# Patient Record
Sex: Female | Born: 1935 | Race: White | Hispanic: No
Health system: Southern US, Community
[De-identification: ages and names within clinical notes are randomized; demographics above are authoritative.]

## PROBLEM LIST (undated history)

## (undated) DIAGNOSIS — F0280 Dementia in other diseases classified elsewhere without behavioral disturbance: Secondary | ICD-10-CM

## (undated) DIAGNOSIS — F339 Major depressive disorder, recurrent, unspecified: Secondary | ICD-10-CM

## (undated) DIAGNOSIS — N185 Chronic kidney disease, stage 5: Secondary | ICD-10-CM

## (undated) DIAGNOSIS — M1990 Unspecified osteoarthritis, unspecified site: Secondary | ICD-10-CM

## (undated) DIAGNOSIS — R410 Disorientation, unspecified: Secondary | ICD-10-CM

## (undated) DIAGNOSIS — E079 Disorder of thyroid, unspecified: Secondary | ICD-10-CM

## (undated) DIAGNOSIS — I10 Essential (primary) hypertension: Secondary | ICD-10-CM

## (undated) DIAGNOSIS — G309 Alzheimer's disease, unspecified: Secondary | ICD-10-CM

## (undated) DIAGNOSIS — I120 Hypertensive chronic kidney disease with stage 5 chronic kidney disease or end stage renal disease: Secondary | ICD-10-CM

## (undated) DIAGNOSIS — E039 Hypothyroidism, unspecified: Secondary | ICD-10-CM

## (undated) HISTORY — DX: Alzheimer's disease, unspecified: G30.9

## (undated) HISTORY — DX: Essential (primary) hypertension: I10

## (undated) HISTORY — PX: SPINE SURGERY: SHX786

## (undated) HISTORY — DX: Disorder of thyroid, unspecified: E07.9

## (undated) HISTORY — DX: Major depressive disorder, recurrent, unspecified: F33.9

## (undated) HISTORY — DX: Hypothyroidism, unspecified: E03.9

## (undated) HISTORY — DX: Hypertensive chronic kidney disease with stage 5 chronic kidney disease or end stage renal disease: I12.0

## (undated) HISTORY — DX: Chronic kidney disease, stage 5: N18.5

## (undated) HISTORY — DX: Dementia in other diseases classified elsewhere without behavioral disturbance: F02.80

## (undated) HISTORY — PX: KNEE ARTHROSCOPY: SHX127

## (undated) HISTORY — DX: Unspecified osteoarthritis, unspecified site: M19.90

---
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
## 1984-08-08 HISTORY — PX: ABDOMINAL HYSTERECTOMY: SHX81

## 2010-10-28 DIAGNOSIS — G309 Alzheimer's disease, unspecified: Secondary | ICD-10-CM | POA: Insufficient documentation

## 2010-10-28 DIAGNOSIS — F0280 Dementia in other diseases classified elsewhere without behavioral disturbance: Secondary | ICD-10-CM | POA: Insufficient documentation

## 2015-08-12 DIAGNOSIS — M1711 Unilateral primary osteoarthritis, right knee: Secondary | ICD-10-CM | POA: Diagnosis not present

## 2015-08-14 DIAGNOSIS — E210 Primary hyperparathyroidism: Secondary | ICD-10-CM | POA: Diagnosis not present

## 2015-08-21 DIAGNOSIS — E041 Nontoxic single thyroid nodule: Secondary | ICD-10-CM | POA: Diagnosis not present

## 2015-08-21 DIAGNOSIS — D351 Benign neoplasm of parathyroid gland: Secondary | ICD-10-CM | POA: Diagnosis not present

## 2015-08-24 DIAGNOSIS — D351 Benign neoplasm of parathyroid gland: Secondary | ICD-10-CM | POA: Diagnosis not present

## 2015-08-26 DIAGNOSIS — D351 Benign neoplasm of parathyroid gland: Secondary | ICD-10-CM | POA: Diagnosis not present

## 2015-09-01 DIAGNOSIS — M2062 Acquired deformities of toe(s), unspecified, left foot: Secondary | ICD-10-CM | POA: Diagnosis not present

## 2015-09-01 DIAGNOSIS — L603 Nail dystrophy: Secondary | ICD-10-CM | POA: Diagnosis not present

## 2015-09-01 DIAGNOSIS — M2061 Acquired deformities of toe(s), unspecified, right foot: Secondary | ICD-10-CM | POA: Diagnosis not present

## 2015-09-11 DIAGNOSIS — M1711 Unilateral primary osteoarthritis, right knee: Secondary | ICD-10-CM | POA: Diagnosis not present

## 2015-09-28 DIAGNOSIS — Z981 Arthrodesis status: Secondary | ICD-10-CM | POA: Diagnosis not present

## 2015-09-28 DIAGNOSIS — M4806 Spinal stenosis, lumbar region: Secondary | ICD-10-CM | POA: Diagnosis not present

## 2015-09-28 DIAGNOSIS — M25461 Effusion, right knee: Secondary | ICD-10-CM | POA: Diagnosis not present

## 2015-09-28 DIAGNOSIS — G8929 Other chronic pain: Secondary | ICD-10-CM | POA: Diagnosis not present

## 2015-09-28 DIAGNOSIS — E213 Hyperparathyroidism, unspecified: Secondary | ICD-10-CM | POA: Diagnosis not present

## 2015-09-28 DIAGNOSIS — I10 Essential (primary) hypertension: Secondary | ICD-10-CM | POA: Diagnosis not present

## 2015-09-28 DIAGNOSIS — M1711 Unilateral primary osteoarthritis, right knee: Secondary | ICD-10-CM | POA: Diagnosis not present

## 2015-09-28 DIAGNOSIS — M545 Low back pain: Secondary | ICD-10-CM | POA: Diagnosis not present

## 2015-09-28 DIAGNOSIS — M25561 Pain in right knee: Secondary | ICD-10-CM | POA: Diagnosis not present

## 2015-10-03 DIAGNOSIS — N200 Calculus of kidney: Secondary | ICD-10-CM | POA: Diagnosis not present

## 2015-11-19 DIAGNOSIS — M1711 Unilateral primary osteoarthritis, right knee: Secondary | ICD-10-CM | POA: Diagnosis not present

## 2015-12-17 DIAGNOSIS — Z01812 Encounter for preprocedural laboratory examination: Secondary | ICD-10-CM | POA: Diagnosis not present

## 2015-12-17 DIAGNOSIS — M1711 Unilateral primary osteoarthritis, right knee: Secondary | ICD-10-CM | POA: Diagnosis not present

## 2015-12-17 DIAGNOSIS — R001 Bradycardia, unspecified: Secondary | ICD-10-CM | POA: Diagnosis not present

## 2015-12-17 DIAGNOSIS — Z01818 Encounter for other preprocedural examination: Secondary | ICD-10-CM | POA: Diagnosis not present

## 2015-12-30 DIAGNOSIS — I10 Essential (primary) hypertension: Secondary | ICD-10-CM | POA: Diagnosis not present

## 2015-12-30 DIAGNOSIS — E213 Hyperparathyroidism, unspecified: Secondary | ICD-10-CM | POA: Diagnosis not present

## 2015-12-30 DIAGNOSIS — G250 Essential tremor: Secondary | ICD-10-CM | POA: Diagnosis not present

## 2015-12-30 DIAGNOSIS — G3184 Mild cognitive impairment, so stated: Secondary | ICD-10-CM | POA: Diagnosis not present

## 2015-12-30 DIAGNOSIS — E034 Atrophy of thyroid (acquired): Secondary | ICD-10-CM | POA: Diagnosis not present

## 2015-12-30 DIAGNOSIS — Z01818 Encounter for other preprocedural examination: Secondary | ICD-10-CM | POA: Diagnosis not present

## 2016-01-06 DIAGNOSIS — I10 Essential (primary) hypertension: Secondary | ICD-10-CM | POA: Diagnosis not present

## 2016-01-12 DIAGNOSIS — I10 Essential (primary) hypertension: Secondary | ICD-10-CM | POA: Diagnosis not present

## 2016-01-13 DIAGNOSIS — M1711 Unilateral primary osteoarthritis, right knee: Secondary | ICD-10-CM | POA: Diagnosis not present

## 2016-01-13 DIAGNOSIS — F329 Major depressive disorder, single episode, unspecified: Secondary | ICD-10-CM | POA: Diagnosis present

## 2016-01-13 DIAGNOSIS — G250 Essential tremor: Secondary | ICD-10-CM | POA: Diagnosis present

## 2016-01-13 DIAGNOSIS — Z66 Do not resuscitate: Secondary | ICD-10-CM | POA: Diagnosis present

## 2016-01-13 DIAGNOSIS — E039 Hypothyroidism, unspecified: Secondary | ICD-10-CM | POA: Diagnosis present

## 2016-01-13 DIAGNOSIS — E213 Hyperparathyroidism, unspecified: Secondary | ICD-10-CM | POA: Diagnosis present

## 2016-01-13 DIAGNOSIS — Z96651 Presence of right artificial knee joint: Secondary | ICD-10-CM | POA: Diagnosis present

## 2016-01-13 DIAGNOSIS — I10 Essential (primary) hypertension: Secondary | ICD-10-CM | POA: Diagnosis present

## 2016-01-13 DIAGNOSIS — Z7982 Long term (current) use of aspirin: Secondary | ICD-10-CM | POA: Diagnosis not present

## 2016-01-13 DIAGNOSIS — Z471 Aftercare following joint replacement surgery: Secondary | ICD-10-CM | POA: Diagnosis not present

## 2016-01-13 DIAGNOSIS — G8918 Other acute postprocedural pain: Secondary | ICD-10-CM | POA: Diagnosis not present

## 2016-01-13 DIAGNOSIS — Z79899 Other long term (current) drug therapy: Secondary | ICD-10-CM | POA: Diagnosis not present

## 2016-01-13 DIAGNOSIS — G609 Hereditary and idiopathic neuropathy, unspecified: Secondary | ICD-10-CM | POA: Diagnosis present

## 2016-01-13 DIAGNOSIS — G3184 Mild cognitive impairment, so stated: Secondary | ICD-10-CM | POA: Diagnosis present

## 2016-01-13 DIAGNOSIS — K5900 Constipation, unspecified: Secondary | ICD-10-CM | POA: Diagnosis present

## 2016-01-16 DIAGNOSIS — R4586 Emotional lability: Secondary | ICD-10-CM | POA: Diagnosis not present

## 2016-01-16 DIAGNOSIS — I10 Essential (primary) hypertension: Secondary | ICD-10-CM | POA: Diagnosis present

## 2016-01-16 DIAGNOSIS — G3184 Mild cognitive impairment, so stated: Secondary | ICD-10-CM | POA: Diagnosis present

## 2016-01-16 DIAGNOSIS — R262 Difficulty in walking, not elsewhere classified: Secondary | ICD-10-CM | POA: Diagnosis not present

## 2016-01-16 DIAGNOSIS — D62 Acute posthemorrhagic anemia: Secondary | ICD-10-CM | POA: Diagnosis not present

## 2016-01-16 DIAGNOSIS — Z96652 Presence of left artificial knee joint: Secondary | ICD-10-CM | POA: Diagnosis not present

## 2016-01-16 DIAGNOSIS — R4189 Other symptoms and signs involving cognitive functions and awareness: Secondary | ICD-10-CM | POA: Diagnosis not present

## 2016-01-16 DIAGNOSIS — K5900 Constipation, unspecified: Secondary | ICD-10-CM | POA: Diagnosis present

## 2016-01-16 DIAGNOSIS — M7989 Other specified soft tissue disorders: Secondary | ICD-10-CM | POA: Diagnosis not present

## 2016-01-16 DIAGNOSIS — G609 Hereditary and idiopathic neuropathy, unspecified: Secondary | ICD-10-CM | POA: Diagnosis present

## 2016-01-16 DIAGNOSIS — Z79899 Other long term (current) drug therapy: Secondary | ICD-10-CM | POA: Diagnosis not present

## 2016-01-16 DIAGNOSIS — G250 Essential tremor: Secondary | ICD-10-CM | POA: Diagnosis present

## 2016-01-16 DIAGNOSIS — Z7982 Long term (current) use of aspirin: Secondary | ICD-10-CM | POA: Diagnosis not present

## 2016-01-16 DIAGNOSIS — F329 Major depressive disorder, single episode, unspecified: Secondary | ICD-10-CM | POA: Diagnosis not present

## 2016-01-16 DIAGNOSIS — E213 Hyperparathyroidism, unspecified: Secondary | ICD-10-CM | POA: Diagnosis not present

## 2016-01-16 DIAGNOSIS — E039 Hypothyroidism, unspecified: Secondary | ICD-10-CM | POA: Diagnosis present

## 2016-01-16 DIAGNOSIS — Z471 Aftercare following joint replacement surgery: Secondary | ICD-10-CM | POA: Diagnosis not present

## 2016-01-16 DIAGNOSIS — R2241 Localized swelling, mass and lump, right lower limb: Secondary | ICD-10-CM | POA: Diagnosis not present

## 2016-01-16 DIAGNOSIS — Z66 Do not resuscitate: Secondary | ICD-10-CM | POA: Diagnosis present

## 2016-01-16 DIAGNOSIS — Z96651 Presence of right artificial knee joint: Secondary | ICD-10-CM | POA: Diagnosis not present

## 2016-01-17 DIAGNOSIS — M7989 Other specified soft tissue disorders: Secondary | ICD-10-CM | POA: Diagnosis not present

## 2016-01-17 DIAGNOSIS — R2241 Localized swelling, mass and lump, right lower limb: Secondary | ICD-10-CM | POA: Diagnosis not present

## 2016-01-18 DIAGNOSIS — R262 Difficulty in walking, not elsewhere classified: Secondary | ICD-10-CM | POA: Diagnosis not present

## 2016-01-18 DIAGNOSIS — R4586 Emotional lability: Secondary | ICD-10-CM | POA: Diagnosis not present

## 2016-01-18 DIAGNOSIS — R4189 Other symptoms and signs involving cognitive functions and awareness: Secondary | ICD-10-CM | POA: Diagnosis not present

## 2016-01-18 DIAGNOSIS — Z96651 Presence of right artificial knee joint: Secondary | ICD-10-CM | POA: Diagnosis not present

## 2016-01-21 DIAGNOSIS — R4189 Other symptoms and signs involving cognitive functions and awareness: Secondary | ICD-10-CM | POA: Diagnosis not present

## 2016-01-21 DIAGNOSIS — E213 Hyperparathyroidism, unspecified: Secondary | ICD-10-CM | POA: Diagnosis not present

## 2016-01-21 DIAGNOSIS — Z96652 Presence of left artificial knee joint: Secondary | ICD-10-CM | POA: Diagnosis not present

## 2016-01-25 DIAGNOSIS — F329 Major depressive disorder, single episode, unspecified: Secondary | ICD-10-CM | POA: Diagnosis not present

## 2016-01-25 DIAGNOSIS — D62 Acute posthemorrhagic anemia: Secondary | ICD-10-CM | POA: Diagnosis not present

## 2016-01-25 DIAGNOSIS — R262 Difficulty in walking, not elsewhere classified: Secondary | ICD-10-CM | POA: Diagnosis not present

## 2016-01-28 DIAGNOSIS — Z96651 Presence of right artificial knee joint: Secondary | ICD-10-CM | POA: Diagnosis not present

## 2016-01-28 DIAGNOSIS — H8149 Vertigo of central origin, unspecified ear: Secondary | ICD-10-CM | POA: Diagnosis not present

## 2016-01-28 DIAGNOSIS — Z471 Aftercare following joint replacement surgery: Secondary | ICD-10-CM | POA: Diagnosis not present

## 2016-01-28 DIAGNOSIS — M4806 Spinal stenosis, lumbar region: Secondary | ICD-10-CM | POA: Diagnosis not present

## 2016-01-28 DIAGNOSIS — M810 Age-related osteoporosis without current pathological fracture: Secondary | ICD-10-CM | POA: Diagnosis not present

## 2016-01-28 DIAGNOSIS — R2689 Other abnormalities of gait and mobility: Secondary | ICD-10-CM | POA: Diagnosis not present

## 2016-01-28 DIAGNOSIS — I10 Essential (primary) hypertension: Secondary | ICD-10-CM | POA: Diagnosis not present

## 2016-01-28 DIAGNOSIS — Z602 Problems related to living alone: Secondary | ICD-10-CM | POA: Diagnosis not present

## 2016-01-28 DIAGNOSIS — F329 Major depressive disorder, single episode, unspecified: Secondary | ICD-10-CM | POA: Diagnosis not present

## 2016-01-29 DIAGNOSIS — F329 Major depressive disorder, single episode, unspecified: Secondary | ICD-10-CM | POA: Diagnosis not present

## 2016-01-29 DIAGNOSIS — Z471 Aftercare following joint replacement surgery: Secondary | ICD-10-CM | POA: Diagnosis not present

## 2016-01-29 DIAGNOSIS — M810 Age-related osteoporosis without current pathological fracture: Secondary | ICD-10-CM | POA: Diagnosis not present

## 2016-01-29 DIAGNOSIS — M4806 Spinal stenosis, lumbar region: Secondary | ICD-10-CM | POA: Diagnosis not present

## 2016-01-29 DIAGNOSIS — I10 Essential (primary) hypertension: Secondary | ICD-10-CM | POA: Diagnosis not present

## 2016-01-29 DIAGNOSIS — R2689 Other abnormalities of gait and mobility: Secondary | ICD-10-CM | POA: Diagnosis not present

## 2016-01-31 DIAGNOSIS — F329 Major depressive disorder, single episode, unspecified: Secondary | ICD-10-CM | POA: Diagnosis not present

## 2016-01-31 DIAGNOSIS — M810 Age-related osteoporosis without current pathological fracture: Secondary | ICD-10-CM | POA: Diagnosis not present

## 2016-01-31 DIAGNOSIS — Z471 Aftercare following joint replacement surgery: Secondary | ICD-10-CM | POA: Diagnosis not present

## 2016-01-31 DIAGNOSIS — M4806 Spinal stenosis, lumbar region: Secondary | ICD-10-CM | POA: Diagnosis not present

## 2016-01-31 DIAGNOSIS — I10 Essential (primary) hypertension: Secondary | ICD-10-CM | POA: Diagnosis not present

## 2016-01-31 DIAGNOSIS — R2689 Other abnormalities of gait and mobility: Secondary | ICD-10-CM | POA: Diagnosis not present

## 2016-02-01 DIAGNOSIS — M810 Age-related osteoporosis without current pathological fracture: Secondary | ICD-10-CM | POA: Diagnosis not present

## 2016-02-01 DIAGNOSIS — Z471 Aftercare following joint replacement surgery: Secondary | ICD-10-CM | POA: Diagnosis not present

## 2016-02-01 DIAGNOSIS — R2689 Other abnormalities of gait and mobility: Secondary | ICD-10-CM | POA: Diagnosis not present

## 2016-02-01 DIAGNOSIS — M4806 Spinal stenosis, lumbar region: Secondary | ICD-10-CM | POA: Diagnosis not present

## 2016-02-01 DIAGNOSIS — I10 Essential (primary) hypertension: Secondary | ICD-10-CM | POA: Diagnosis not present

## 2016-02-01 DIAGNOSIS — F329 Major depressive disorder, single episode, unspecified: Secondary | ICD-10-CM | POA: Diagnosis not present

## 2016-02-03 DIAGNOSIS — Z471 Aftercare following joint replacement surgery: Secondary | ICD-10-CM | POA: Diagnosis not present

## 2016-02-03 DIAGNOSIS — M4806 Spinal stenosis, lumbar region: Secondary | ICD-10-CM | POA: Diagnosis not present

## 2016-02-03 DIAGNOSIS — F329 Major depressive disorder, single episode, unspecified: Secondary | ICD-10-CM | POA: Diagnosis not present

## 2016-02-03 DIAGNOSIS — R2689 Other abnormalities of gait and mobility: Secondary | ICD-10-CM | POA: Diagnosis not present

## 2016-02-03 DIAGNOSIS — M810 Age-related osteoporosis without current pathological fracture: Secondary | ICD-10-CM | POA: Diagnosis not present

## 2016-02-03 DIAGNOSIS — I10 Essential (primary) hypertension: Secondary | ICD-10-CM | POA: Diagnosis not present

## 2016-02-05 DIAGNOSIS — F329 Major depressive disorder, single episode, unspecified: Secondary | ICD-10-CM | POA: Diagnosis not present

## 2016-02-05 DIAGNOSIS — M4806 Spinal stenosis, lumbar region: Secondary | ICD-10-CM | POA: Diagnosis not present

## 2016-02-05 DIAGNOSIS — I10 Essential (primary) hypertension: Secondary | ICD-10-CM | POA: Diagnosis not present

## 2016-02-05 DIAGNOSIS — M810 Age-related osteoporosis without current pathological fracture: Secondary | ICD-10-CM | POA: Diagnosis not present

## 2016-02-05 DIAGNOSIS — R2689 Other abnormalities of gait and mobility: Secondary | ICD-10-CM | POA: Diagnosis not present

## 2016-02-05 DIAGNOSIS — Z471 Aftercare following joint replacement surgery: Secondary | ICD-10-CM | POA: Diagnosis not present

## 2016-02-06 DIAGNOSIS — Z96651 Presence of right artificial knee joint: Secondary | ICD-10-CM | POA: Diagnosis not present

## 2016-02-06 DIAGNOSIS — Z471 Aftercare following joint replacement surgery: Secondary | ICD-10-CM | POA: Diagnosis not present

## 2016-02-08 DIAGNOSIS — M810 Age-related osteoporosis without current pathological fracture: Secondary | ICD-10-CM | POA: Diagnosis not present

## 2016-02-08 DIAGNOSIS — R2689 Other abnormalities of gait and mobility: Secondary | ICD-10-CM | POA: Diagnosis not present

## 2016-02-08 DIAGNOSIS — Z471 Aftercare following joint replacement surgery: Secondary | ICD-10-CM | POA: Diagnosis not present

## 2016-02-08 DIAGNOSIS — F329 Major depressive disorder, single episode, unspecified: Secondary | ICD-10-CM | POA: Diagnosis not present

## 2016-02-08 DIAGNOSIS — I10 Essential (primary) hypertension: Secondary | ICD-10-CM | POA: Diagnosis not present

## 2016-02-08 DIAGNOSIS — M4806 Spinal stenosis, lumbar region: Secondary | ICD-10-CM | POA: Diagnosis not present

## 2016-02-10 DIAGNOSIS — I10 Essential (primary) hypertension: Secondary | ICD-10-CM | POA: Diagnosis not present

## 2016-02-10 DIAGNOSIS — F329 Major depressive disorder, single episode, unspecified: Secondary | ICD-10-CM | POA: Diagnosis not present

## 2016-02-10 DIAGNOSIS — M4806 Spinal stenosis, lumbar region: Secondary | ICD-10-CM | POA: Diagnosis not present

## 2016-02-10 DIAGNOSIS — M810 Age-related osteoporosis without current pathological fracture: Secondary | ICD-10-CM | POA: Diagnosis not present

## 2016-02-10 DIAGNOSIS — Z471 Aftercare following joint replacement surgery: Secondary | ICD-10-CM | POA: Diagnosis not present

## 2016-02-10 DIAGNOSIS — R2689 Other abnormalities of gait and mobility: Secondary | ICD-10-CM | POA: Diagnosis not present

## 2016-02-11 DIAGNOSIS — M810 Age-related osteoporosis without current pathological fracture: Secondary | ICD-10-CM | POA: Diagnosis not present

## 2016-02-11 DIAGNOSIS — I10 Essential (primary) hypertension: Secondary | ICD-10-CM | POA: Diagnosis not present

## 2016-02-11 DIAGNOSIS — Z471 Aftercare following joint replacement surgery: Secondary | ICD-10-CM | POA: Diagnosis not present

## 2016-02-11 DIAGNOSIS — R2689 Other abnormalities of gait and mobility: Secondary | ICD-10-CM | POA: Diagnosis not present

## 2016-02-11 DIAGNOSIS — M4806 Spinal stenosis, lumbar region: Secondary | ICD-10-CM | POA: Diagnosis not present

## 2016-02-11 DIAGNOSIS — F329 Major depressive disorder, single episode, unspecified: Secondary | ICD-10-CM | POA: Diagnosis not present

## 2016-02-12 DIAGNOSIS — M810 Age-related osteoporosis without current pathological fracture: Secondary | ICD-10-CM | POA: Diagnosis not present

## 2016-02-12 DIAGNOSIS — I10 Essential (primary) hypertension: Secondary | ICD-10-CM | POA: Diagnosis not present

## 2016-02-12 DIAGNOSIS — Z471 Aftercare following joint replacement surgery: Secondary | ICD-10-CM | POA: Diagnosis not present

## 2016-02-12 DIAGNOSIS — F329 Major depressive disorder, single episode, unspecified: Secondary | ICD-10-CM | POA: Diagnosis not present

## 2016-02-12 DIAGNOSIS — R2689 Other abnormalities of gait and mobility: Secondary | ICD-10-CM | POA: Diagnosis not present

## 2016-02-12 DIAGNOSIS — M4806 Spinal stenosis, lumbar region: Secondary | ICD-10-CM | POA: Diagnosis not present

## 2016-02-15 DIAGNOSIS — I10 Essential (primary) hypertension: Secondary | ICD-10-CM | POA: Diagnosis not present

## 2016-02-15 DIAGNOSIS — R2689 Other abnormalities of gait and mobility: Secondary | ICD-10-CM | POA: Diagnosis not present

## 2016-02-15 DIAGNOSIS — M810 Age-related osteoporosis without current pathological fracture: Secondary | ICD-10-CM | POA: Diagnosis not present

## 2016-02-15 DIAGNOSIS — M4806 Spinal stenosis, lumbar region: Secondary | ICD-10-CM | POA: Diagnosis not present

## 2016-02-15 DIAGNOSIS — F329 Major depressive disorder, single episode, unspecified: Secondary | ICD-10-CM | POA: Diagnosis not present

## 2016-02-15 DIAGNOSIS — Z471 Aftercare following joint replacement surgery: Secondary | ICD-10-CM | POA: Diagnosis not present

## 2016-02-17 DIAGNOSIS — R2689 Other abnormalities of gait and mobility: Secondary | ICD-10-CM | POA: Diagnosis not present

## 2016-02-17 DIAGNOSIS — I10 Essential (primary) hypertension: Secondary | ICD-10-CM | POA: Diagnosis not present

## 2016-02-17 DIAGNOSIS — M4806 Spinal stenosis, lumbar region: Secondary | ICD-10-CM | POA: Diagnosis not present

## 2016-02-17 DIAGNOSIS — M810 Age-related osteoporosis without current pathological fracture: Secondary | ICD-10-CM | POA: Diagnosis not present

## 2016-02-17 DIAGNOSIS — Z471 Aftercare following joint replacement surgery: Secondary | ICD-10-CM | POA: Diagnosis not present

## 2016-02-17 DIAGNOSIS — F329 Major depressive disorder, single episode, unspecified: Secondary | ICD-10-CM | POA: Diagnosis not present

## 2016-02-18 DIAGNOSIS — M5136 Other intervertebral disc degeneration, lumbar region: Secondary | ICD-10-CM | POA: Diagnosis not present

## 2016-02-18 DIAGNOSIS — I10 Essential (primary) hypertension: Secondary | ICD-10-CM | POA: Diagnosis not present

## 2016-02-18 DIAGNOSIS — R413 Other amnesia: Secondary | ICD-10-CM | POA: Diagnosis not present

## 2016-02-18 DIAGNOSIS — M4686 Other specified inflammatory spondylopathies, lumbar region: Secondary | ICD-10-CM | POA: Diagnosis not present

## 2016-02-18 DIAGNOSIS — M5441 Lumbago with sciatica, right side: Secondary | ICD-10-CM | POA: Diagnosis not present

## 2016-02-18 DIAGNOSIS — M5442 Lumbago with sciatica, left side: Secondary | ICD-10-CM | POA: Diagnosis not present

## 2016-02-18 DIAGNOSIS — R262 Difficulty in walking, not elsewhere classified: Secondary | ICD-10-CM | POA: Diagnosis not present

## 2016-02-18 DIAGNOSIS — G8929 Other chronic pain: Secondary | ICD-10-CM | POA: Diagnosis not present

## 2016-02-18 DIAGNOSIS — Z981 Arthrodesis status: Secondary | ICD-10-CM | POA: Diagnosis not present

## 2016-02-18 DIAGNOSIS — M4806 Spinal stenosis, lumbar region: Secondary | ICD-10-CM | POA: Diagnosis not present

## 2016-02-18 DIAGNOSIS — M4316 Spondylolisthesis, lumbar region: Secondary | ICD-10-CM | POA: Diagnosis not present

## 2016-02-19 DIAGNOSIS — M810 Age-related osteoporosis without current pathological fracture: Secondary | ICD-10-CM | POA: Diagnosis not present

## 2016-02-19 DIAGNOSIS — M4806 Spinal stenosis, lumbar region: Secondary | ICD-10-CM | POA: Diagnosis not present

## 2016-02-19 DIAGNOSIS — I10 Essential (primary) hypertension: Secondary | ICD-10-CM | POA: Diagnosis not present

## 2016-02-19 DIAGNOSIS — F329 Major depressive disorder, single episode, unspecified: Secondary | ICD-10-CM | POA: Diagnosis not present

## 2016-02-19 DIAGNOSIS — R2689 Other abnormalities of gait and mobility: Secondary | ICD-10-CM | POA: Diagnosis not present

## 2016-02-19 DIAGNOSIS — Z471 Aftercare following joint replacement surgery: Secondary | ICD-10-CM | POA: Diagnosis not present

## 2016-02-23 DIAGNOSIS — I10 Essential (primary) hypertension: Secondary | ICD-10-CM | POA: Diagnosis not present

## 2016-02-23 DIAGNOSIS — Z471 Aftercare following joint replacement surgery: Secondary | ICD-10-CM | POA: Diagnosis not present

## 2016-02-23 DIAGNOSIS — R2689 Other abnormalities of gait and mobility: Secondary | ICD-10-CM | POA: Diagnosis not present

## 2016-02-23 DIAGNOSIS — M810 Age-related osteoporosis without current pathological fracture: Secondary | ICD-10-CM | POA: Diagnosis not present

## 2016-02-23 DIAGNOSIS — M4806 Spinal stenosis, lumbar region: Secondary | ICD-10-CM | POA: Diagnosis not present

## 2016-02-23 DIAGNOSIS — F329 Major depressive disorder, single episode, unspecified: Secondary | ICD-10-CM | POA: Diagnosis not present

## 2016-02-25 DIAGNOSIS — I10 Essential (primary) hypertension: Secondary | ICD-10-CM | POA: Diagnosis not present

## 2016-02-25 DIAGNOSIS — F329 Major depressive disorder, single episode, unspecified: Secondary | ICD-10-CM | POA: Diagnosis not present

## 2016-02-25 DIAGNOSIS — M810 Age-related osteoporosis without current pathological fracture: Secondary | ICD-10-CM | POA: Diagnosis not present

## 2016-02-25 DIAGNOSIS — R2689 Other abnormalities of gait and mobility: Secondary | ICD-10-CM | POA: Diagnosis not present

## 2016-02-25 DIAGNOSIS — M4806 Spinal stenosis, lumbar region: Secondary | ICD-10-CM | POA: Diagnosis not present

## 2016-02-25 DIAGNOSIS — Z471 Aftercare following joint replacement surgery: Secondary | ICD-10-CM | POA: Diagnosis not present

## 2016-02-26 DIAGNOSIS — Z471 Aftercare following joint replacement surgery: Secondary | ICD-10-CM | POA: Diagnosis not present

## 2016-02-26 DIAGNOSIS — F329 Major depressive disorder, single episode, unspecified: Secondary | ICD-10-CM | POA: Diagnosis not present

## 2016-02-26 DIAGNOSIS — R2689 Other abnormalities of gait and mobility: Secondary | ICD-10-CM | POA: Diagnosis not present

## 2016-02-26 DIAGNOSIS — M4806 Spinal stenosis, lumbar region: Secondary | ICD-10-CM | POA: Diagnosis not present

## 2016-02-26 DIAGNOSIS — M810 Age-related osteoporosis without current pathological fracture: Secondary | ICD-10-CM | POA: Diagnosis not present

## 2016-02-26 DIAGNOSIS — I10 Essential (primary) hypertension: Secondary | ICD-10-CM | POA: Diagnosis not present

## 2016-02-29 DIAGNOSIS — I10 Essential (primary) hypertension: Secondary | ICD-10-CM | POA: Diagnosis not present

## 2016-02-29 DIAGNOSIS — Z471 Aftercare following joint replacement surgery: Secondary | ICD-10-CM | POA: Diagnosis not present

## 2016-02-29 DIAGNOSIS — R2689 Other abnormalities of gait and mobility: Secondary | ICD-10-CM | POA: Diagnosis not present

## 2016-02-29 DIAGNOSIS — F329 Major depressive disorder, single episode, unspecified: Secondary | ICD-10-CM | POA: Diagnosis not present

## 2016-02-29 DIAGNOSIS — M810 Age-related osteoporosis without current pathological fracture: Secondary | ICD-10-CM | POA: Diagnosis not present

## 2016-02-29 DIAGNOSIS — M4806 Spinal stenosis, lumbar region: Secondary | ICD-10-CM | POA: Diagnosis not present

## 2016-03-01 DIAGNOSIS — I10 Essential (primary) hypertension: Secondary | ICD-10-CM | POA: Diagnosis not present

## 2016-03-01 DIAGNOSIS — M5441 Lumbago with sciatica, right side: Secondary | ICD-10-CM | POA: Diagnosis not present

## 2016-03-01 DIAGNOSIS — G3184 Mild cognitive impairment, so stated: Secondary | ICD-10-CM | POA: Diagnosis not present

## 2016-03-01 DIAGNOSIS — M5442 Lumbago with sciatica, left side: Secondary | ICD-10-CM | POA: Diagnosis not present

## 2016-03-01 DIAGNOSIS — G8929 Other chronic pain: Secondary | ICD-10-CM | POA: Diagnosis not present

## 2016-03-02 DIAGNOSIS — Z471 Aftercare following joint replacement surgery: Secondary | ICD-10-CM | POA: Diagnosis not present

## 2016-03-02 DIAGNOSIS — M810 Age-related osteoporosis without current pathological fracture: Secondary | ICD-10-CM | POA: Diagnosis not present

## 2016-03-02 DIAGNOSIS — M4806 Spinal stenosis, lumbar region: Secondary | ICD-10-CM | POA: Diagnosis not present

## 2016-03-02 DIAGNOSIS — F329 Major depressive disorder, single episode, unspecified: Secondary | ICD-10-CM | POA: Diagnosis not present

## 2016-03-02 DIAGNOSIS — R2689 Other abnormalities of gait and mobility: Secondary | ICD-10-CM | POA: Diagnosis not present

## 2016-03-02 DIAGNOSIS — I10 Essential (primary) hypertension: Secondary | ICD-10-CM | POA: Diagnosis not present

## 2016-03-04 DIAGNOSIS — Z471 Aftercare following joint replacement surgery: Secondary | ICD-10-CM | POA: Diagnosis not present

## 2016-03-04 DIAGNOSIS — M810 Age-related osteoporosis without current pathological fracture: Secondary | ICD-10-CM | POA: Diagnosis not present

## 2016-03-04 DIAGNOSIS — R2689 Other abnormalities of gait and mobility: Secondary | ICD-10-CM | POA: Diagnosis not present

## 2016-03-04 DIAGNOSIS — F329 Major depressive disorder, single episode, unspecified: Secondary | ICD-10-CM | POA: Diagnosis not present

## 2016-03-04 DIAGNOSIS — M4806 Spinal stenosis, lumbar region: Secondary | ICD-10-CM | POA: Diagnosis not present

## 2016-03-04 DIAGNOSIS — I10 Essential (primary) hypertension: Secondary | ICD-10-CM | POA: Diagnosis not present

## 2016-03-10 DIAGNOSIS — M549 Dorsalgia, unspecified: Secondary | ICD-10-CM | POA: Diagnosis not present

## 2016-03-10 DIAGNOSIS — M5416 Radiculopathy, lumbar region: Secondary | ICD-10-CM | POA: Diagnosis not present

## 2016-03-10 DIAGNOSIS — M47817 Spondylosis without myelopathy or radiculopathy, lumbosacral region: Secondary | ICD-10-CM | POA: Diagnosis not present

## 2016-03-10 DIAGNOSIS — Z981 Arthrodesis status: Secondary | ICD-10-CM | POA: Diagnosis not present

## 2016-03-10 DIAGNOSIS — M461 Sacroiliitis, not elsewhere classified: Secondary | ICD-10-CM | POA: Diagnosis not present

## 2016-03-11 DIAGNOSIS — F0280 Dementia in other diseases classified elsewhere without behavioral disturbance: Secondary | ICD-10-CM | POA: Diagnosis not present

## 2016-03-11 DIAGNOSIS — G301 Alzheimer's disease with late onset: Secondary | ICD-10-CM | POA: Diagnosis not present

## 2016-03-11 DIAGNOSIS — K5904 Chronic idiopathic constipation: Secondary | ICD-10-CM | POA: Diagnosis not present

## 2016-03-11 DIAGNOSIS — E034 Atrophy of thyroid (acquired): Secondary | ICD-10-CM | POA: Diagnosis not present

## 2016-03-11 DIAGNOSIS — I10 Essential (primary) hypertension: Secondary | ICD-10-CM | POA: Diagnosis not present

## 2016-03-21 DIAGNOSIS — M961 Postlaminectomy syndrome, not elsewhere classified: Secondary | ICD-10-CM | POA: Diagnosis not present

## 2016-03-21 DIAGNOSIS — Z981 Arthrodesis status: Secondary | ICD-10-CM | POA: Diagnosis not present

## 2016-04-15 DIAGNOSIS — M791 Myalgia: Secondary | ICD-10-CM | POA: Diagnosis not present

## 2016-04-28 DIAGNOSIS — Z471 Aftercare following joint replacement surgery: Secondary | ICD-10-CM | POA: Diagnosis not present

## 2016-04-28 DIAGNOSIS — Z96651 Presence of right artificial knee joint: Secondary | ICD-10-CM | POA: Diagnosis not present

## 2016-06-06 DIAGNOSIS — M5417 Radiculopathy, lumbosacral region: Secondary | ICD-10-CM | POA: Diagnosis not present

## 2016-06-06 DIAGNOSIS — G4700 Insomnia, unspecified: Secondary | ICD-10-CM | POA: Diagnosis not present

## 2016-06-06 DIAGNOSIS — F0280 Dementia in other diseases classified elsewhere without behavioral disturbance: Secondary | ICD-10-CM | POA: Diagnosis not present

## 2016-06-06 DIAGNOSIS — F418 Other specified anxiety disorders: Secondary | ICD-10-CM | POA: Diagnosis not present

## 2016-06-06 DIAGNOSIS — G309 Alzheimer's disease, unspecified: Secondary | ICD-10-CM | POA: Diagnosis not present

## 2016-06-13 DIAGNOSIS — E039 Hypothyroidism, unspecified: Secondary | ICD-10-CM | POA: Diagnosis not present

## 2016-06-13 DIAGNOSIS — E213 Hyperparathyroidism, unspecified: Secondary | ICD-10-CM | POA: Diagnosis not present

## 2016-06-13 DIAGNOSIS — I10 Essential (primary) hypertension: Secondary | ICD-10-CM | POA: Diagnosis not present

## 2016-06-13 DIAGNOSIS — M810 Age-related osteoporosis without current pathological fracture: Secondary | ICD-10-CM | POA: Diagnosis not present

## 2016-06-13 DIAGNOSIS — G309 Alzheimer's disease, unspecified: Secondary | ICD-10-CM | POA: Diagnosis not present

## 2016-06-13 DIAGNOSIS — Z0000 Encounter for general adult medical examination without abnormal findings: Secondary | ICD-10-CM | POA: Diagnosis not present

## 2016-06-13 DIAGNOSIS — M4800 Spinal stenosis, site unspecified: Secondary | ICD-10-CM | POA: Diagnosis not present

## 2016-06-13 DIAGNOSIS — K5900 Constipation, unspecified: Secondary | ICD-10-CM | POA: Diagnosis not present

## 2016-06-13 DIAGNOSIS — Z23 Encounter for immunization: Secondary | ICD-10-CM | POA: Diagnosis not present

## 2016-06-13 DIAGNOSIS — F329 Major depressive disorder, single episode, unspecified: Secondary | ICD-10-CM | POA: Diagnosis not present

## 2016-06-16 DIAGNOSIS — F329 Major depressive disorder, single episode, unspecified: Secondary | ICD-10-CM | POA: Diagnosis not present

## 2016-06-16 DIAGNOSIS — Z0000 Encounter for general adult medical examination without abnormal findings: Secondary | ICD-10-CM | POA: Diagnosis not present

## 2016-06-16 DIAGNOSIS — M4800 Spinal stenosis, site unspecified: Secondary | ICD-10-CM | POA: Diagnosis not present

## 2016-06-16 DIAGNOSIS — I10 Essential (primary) hypertension: Secondary | ICD-10-CM | POA: Diagnosis not present

## 2016-06-16 DIAGNOSIS — E039 Hypothyroidism, unspecified: Secondary | ICD-10-CM | POA: Diagnosis not present

## 2016-06-16 DIAGNOSIS — G309 Alzheimer's disease, unspecified: Secondary | ICD-10-CM | POA: Diagnosis not present

## 2016-06-16 DIAGNOSIS — K5900 Constipation, unspecified: Secondary | ICD-10-CM | POA: Diagnosis not present

## 2016-06-17 DIAGNOSIS — M5416 Radiculopathy, lumbar region: Secondary | ICD-10-CM | POA: Diagnosis not present

## 2016-06-17 DIAGNOSIS — M48061 Spinal stenosis, lumbar region without neurogenic claudication: Secondary | ICD-10-CM | POA: Diagnosis not present

## 2016-06-17 DIAGNOSIS — M961 Postlaminectomy syndrome, not elsewhere classified: Secondary | ICD-10-CM | POA: Diagnosis not present

## 2016-06-22 DIAGNOSIS — G309 Alzheimer's disease, unspecified: Secondary | ICD-10-CM | POA: Diagnosis not present

## 2016-06-22 DIAGNOSIS — I10 Essential (primary) hypertension: Secondary | ICD-10-CM | POA: Diagnosis not present

## 2016-06-22 DIAGNOSIS — G4700 Insomnia, unspecified: Secondary | ICD-10-CM | POA: Diagnosis not present

## 2016-06-22 DIAGNOSIS — F329 Major depressive disorder, single episode, unspecified: Secondary | ICD-10-CM | POA: Diagnosis not present

## 2016-06-23 DIAGNOSIS — M4686 Other specified inflammatory spondylopathies, lumbar region: Secondary | ICD-10-CM | POA: Diagnosis not present

## 2016-06-23 DIAGNOSIS — M5136 Other intervertebral disc degeneration, lumbar region: Secondary | ICD-10-CM | POA: Diagnosis not present

## 2016-06-23 DIAGNOSIS — M5417 Radiculopathy, lumbosacral region: Secondary | ICD-10-CM | POA: Diagnosis not present

## 2016-06-27 DIAGNOSIS — G309 Alzheimer's disease, unspecified: Secondary | ICD-10-CM | POA: Diagnosis not present

## 2016-06-27 DIAGNOSIS — G9340 Encephalopathy, unspecified: Secondary | ICD-10-CM | POA: Diagnosis not present

## 2016-06-27 DIAGNOSIS — F0280 Dementia in other diseases classified elsewhere without behavioral disturbance: Secondary | ICD-10-CM | POA: Diagnosis not present

## 2016-06-27 DIAGNOSIS — F418 Other specified anxiety disorders: Secondary | ICD-10-CM | POA: Diagnosis not present

## 2016-07-11 DIAGNOSIS — F329 Major depressive disorder, single episode, unspecified: Secondary | ICD-10-CM | POA: Diagnosis not present

## 2016-07-11 DIAGNOSIS — I10 Essential (primary) hypertension: Secondary | ICD-10-CM | POA: Diagnosis not present

## 2016-07-11 DIAGNOSIS — M5417 Radiculopathy, lumbosacral region: Secondary | ICD-10-CM | POA: Diagnosis not present

## 2016-07-11 DIAGNOSIS — G4700 Insomnia, unspecified: Secondary | ICD-10-CM | POA: Diagnosis not present

## 2016-07-12 DIAGNOSIS — M5417 Radiculopathy, lumbosacral region: Secondary | ICD-10-CM | POA: Diagnosis not present

## 2016-07-12 DIAGNOSIS — Z4789 Encounter for other orthopedic aftercare: Secondary | ICD-10-CM | POA: Diagnosis not present

## 2016-07-12 DIAGNOSIS — M961 Postlaminectomy syndrome, not elsewhere classified: Secondary | ICD-10-CM | POA: Diagnosis not present

## 2016-07-12 DIAGNOSIS — Z8739 Personal history of other diseases of the musculoskeletal system and connective tissue: Secondary | ICD-10-CM | POA: Diagnosis not present

## 2016-07-12 DIAGNOSIS — M48061 Spinal stenosis, lumbar region without neurogenic claudication: Secondary | ICD-10-CM | POA: Diagnosis not present

## 2016-07-12 DIAGNOSIS — Z981 Arthrodesis status: Secondary | ICD-10-CM | POA: Diagnosis not present

## 2016-07-15 DIAGNOSIS — Z4789 Encounter for other orthopedic aftercare: Secondary | ICD-10-CM | POA: Diagnosis not present

## 2016-07-15 DIAGNOSIS — Z981 Arthrodesis status: Secondary | ICD-10-CM | POA: Diagnosis not present

## 2016-07-15 DIAGNOSIS — Z8739 Personal history of other diseases of the musculoskeletal system and connective tissue: Secondary | ICD-10-CM | POA: Diagnosis not present

## 2016-07-19 DIAGNOSIS — Z8739 Personal history of other diseases of the musculoskeletal system and connective tissue: Secondary | ICD-10-CM | POA: Diagnosis not present

## 2016-07-19 DIAGNOSIS — Z981 Arthrodesis status: Secondary | ICD-10-CM | POA: Diagnosis not present

## 2016-07-19 DIAGNOSIS — Z4789 Encounter for other orthopedic aftercare: Secondary | ICD-10-CM | POA: Diagnosis not present

## 2016-07-22 DIAGNOSIS — Z4789 Encounter for other orthopedic aftercare: Secondary | ICD-10-CM | POA: Diagnosis not present

## 2016-07-22 DIAGNOSIS — Z981 Arthrodesis status: Secondary | ICD-10-CM | POA: Diagnosis not present

## 2016-07-22 DIAGNOSIS — Z8739 Personal history of other diseases of the musculoskeletal system and connective tissue: Secondary | ICD-10-CM | POA: Diagnosis not present

## 2016-07-25 DIAGNOSIS — Z8739 Personal history of other diseases of the musculoskeletal system and connective tissue: Secondary | ICD-10-CM | POA: Diagnosis not present

## 2016-07-25 DIAGNOSIS — Z981 Arthrodesis status: Secondary | ICD-10-CM | POA: Diagnosis not present

## 2016-07-25 DIAGNOSIS — Z4789 Encounter for other orthopedic aftercare: Secondary | ICD-10-CM | POA: Diagnosis not present

## 2016-07-29 DIAGNOSIS — Z8739 Personal history of other diseases of the musculoskeletal system and connective tissue: Secondary | ICD-10-CM | POA: Diagnosis not present

## 2016-07-29 DIAGNOSIS — Z4789 Encounter for other orthopedic aftercare: Secondary | ICD-10-CM | POA: Diagnosis not present

## 2016-07-29 DIAGNOSIS — Z981 Arthrodesis status: Secondary | ICD-10-CM | POA: Diagnosis not present

## 2016-08-11 DIAGNOSIS — Z981 Arthrodesis status: Secondary | ICD-10-CM | POA: Diagnosis not present

## 2016-08-11 DIAGNOSIS — Z4789 Encounter for other orthopedic aftercare: Secondary | ICD-10-CM | POA: Diagnosis not present

## 2016-08-11 DIAGNOSIS — Z8739 Personal history of other diseases of the musculoskeletal system and connective tissue: Secondary | ICD-10-CM | POA: Diagnosis not present

## 2016-08-16 DIAGNOSIS — Z8739 Personal history of other diseases of the musculoskeletal system and connective tissue: Secondary | ICD-10-CM | POA: Diagnosis not present

## 2016-08-16 DIAGNOSIS — Z981 Arthrodesis status: Secondary | ICD-10-CM | POA: Diagnosis not present

## 2016-08-16 DIAGNOSIS — Z4789 Encounter for other orthopedic aftercare: Secondary | ICD-10-CM | POA: Diagnosis not present

## 2016-08-25 DIAGNOSIS — Z981 Arthrodesis status: Secondary | ICD-10-CM | POA: Diagnosis not present

## 2016-08-25 DIAGNOSIS — Z4789 Encounter for other orthopedic aftercare: Secondary | ICD-10-CM | POA: Diagnosis not present

## 2016-08-25 DIAGNOSIS — Z8739 Personal history of other diseases of the musculoskeletal system and connective tissue: Secondary | ICD-10-CM | POA: Diagnosis not present

## 2016-09-07 DIAGNOSIS — F329 Major depressive disorder, single episode, unspecified: Secondary | ICD-10-CM | POA: Diagnosis not present

## 2016-09-07 DIAGNOSIS — I10 Essential (primary) hypertension: Secondary | ICD-10-CM | POA: Diagnosis not present

## 2016-09-07 DIAGNOSIS — E213 Hyperparathyroidism, unspecified: Secondary | ICD-10-CM | POA: Diagnosis not present

## 2016-09-07 DIAGNOSIS — E039 Hypothyroidism, unspecified: Secondary | ICD-10-CM | POA: Diagnosis not present

## 2016-09-07 DIAGNOSIS — Z23 Encounter for immunization: Secondary | ICD-10-CM | POA: Diagnosis not present

## 2016-09-12 DIAGNOSIS — M5416 Radiculopathy, lumbar region: Secondary | ICD-10-CM | POA: Diagnosis not present

## 2016-09-12 DIAGNOSIS — G8929 Other chronic pain: Secondary | ICD-10-CM | POA: Diagnosis not present

## 2016-09-12 DIAGNOSIS — F329 Major depressive disorder, single episode, unspecified: Secondary | ICD-10-CM | POA: Diagnosis not present

## 2016-09-12 DIAGNOSIS — F0281 Dementia in other diseases classified elsewhere with behavioral disturbance: Secondary | ICD-10-CM | POA: Diagnosis not present

## 2016-09-12 DIAGNOSIS — G309 Alzheimer's disease, unspecified: Secondary | ICD-10-CM | POA: Diagnosis not present

## 2016-09-28 DIAGNOSIS — F0280 Dementia in other diseases classified elsewhere without behavioral disturbance: Secondary | ICD-10-CM | POA: Diagnosis not present

## 2016-09-28 DIAGNOSIS — E213 Hyperparathyroidism, unspecified: Secondary | ICD-10-CM | POA: Diagnosis not present

## 2016-09-28 DIAGNOSIS — G4700 Insomnia, unspecified: Secondary | ICD-10-CM | POA: Diagnosis not present

## 2016-09-28 DIAGNOSIS — K5900 Constipation, unspecified: Secondary | ICD-10-CM | POA: Diagnosis not present

## 2016-09-28 DIAGNOSIS — G309 Alzheimer's disease, unspecified: Secondary | ICD-10-CM | POA: Diagnosis not present

## 2016-09-28 DIAGNOSIS — E039 Hypothyroidism, unspecified: Secondary | ICD-10-CM | POA: Diagnosis not present

## 2016-09-28 DIAGNOSIS — I10 Essential (primary) hypertension: Secondary | ICD-10-CM | POA: Diagnosis not present

## 2016-09-28 DIAGNOSIS — M4800 Spinal stenosis, site unspecified: Secondary | ICD-10-CM | POA: Diagnosis not present

## 2016-09-28 DIAGNOSIS — G8929 Other chronic pain: Secondary | ICD-10-CM | POA: Diagnosis not present

## 2016-09-28 DIAGNOSIS — M810 Age-related osteoporosis without current pathological fracture: Secondary | ICD-10-CM | POA: Diagnosis not present

## 2016-09-28 DIAGNOSIS — F329 Major depressive disorder, single episode, unspecified: Secondary | ICD-10-CM | POA: Diagnosis not present

## 2016-10-27 ENCOUNTER — Non-Acute Institutional Stay: Payer: Medicare Other | Admitting: Nurse Practitioner

## 2016-10-27 ENCOUNTER — Encounter: Payer: Self-pay | Admitting: Nurse Practitioner

## 2016-10-27 DIAGNOSIS — F339 Major depressive disorder, recurrent, unspecified: Secondary | ICD-10-CM

## 2016-10-27 DIAGNOSIS — I10 Essential (primary) hypertension: Secondary | ICD-10-CM | POA: Diagnosis not present

## 2016-10-27 DIAGNOSIS — E039 Hypothyroidism, unspecified: Secondary | ICD-10-CM | POA: Diagnosis not present

## 2016-10-27 DIAGNOSIS — F32A Depression, unspecified: Secondary | ICD-10-CM | POA: Insufficient documentation

## 2016-10-27 DIAGNOSIS — M1990 Unspecified osteoarthritis, unspecified site: Secondary | ICD-10-CM

## 2016-10-27 DIAGNOSIS — F419 Anxiety disorder, unspecified: Secondary | ICD-10-CM | POA: Insufficient documentation

## 2016-10-27 DIAGNOSIS — F0280 Dementia in other diseases classified elsewhere without behavioral disturbance: Secondary | ICD-10-CM

## 2016-10-27 DIAGNOSIS — M159 Polyosteoarthritis, unspecified: Secondary | ICD-10-CM | POA: Insufficient documentation

## 2016-10-27 DIAGNOSIS — G309 Alzheimer's disease, unspecified: Secondary | ICD-10-CM

## 2016-10-27 HISTORY — DX: Essential (primary) hypertension: I10

## 2016-10-27 HISTORY — DX: Dementia in other diseases classified elsewhere, unspecified severity, without behavioral disturbance, psychotic disturbance, mood disturbance, and anxiety: F02.80

## 2016-10-27 HISTORY — DX: Major depressive disorder, recurrent, unspecified: F33.9

## 2016-10-27 HISTORY — DX: Hypothyroidism, unspecified: E03.9

## 2016-10-27 HISTORY — DX: Unspecified osteoarthritis, unspecified site: M19.90

## 2016-10-27 NOTE — Assessment & Plan Note (Signed)
Stable, she stated she is adjusting to [facility], frequent awakens at night, but not new. Continue Cymbalta 30mg , Trazodone 50mg , observe.

## 2016-10-27 NOTE — Assessment & Plan Note (Signed)
Taking Levothyroxine 75mcg daily, update TSH

## 2016-10-27 NOTE — Assessment & Plan Note (Signed)
Multiple sites, ambulates with walker, taking naproxen 220mg  bid.

## 2016-10-27 NOTE — Assessment & Plan Note (Signed)
AL for living, update MMSE, continue Namenda, Aricept.

## 2016-10-27 NOTE — Progress Notes (Signed)
Provider:  Marlana Latus NP Location:   [facility]   Place of Service:  ALF (13)  PCP: Britton Bera X, NP Patient Care Team: Quinterious Walraven Otho Darner, NP as PCP - General (Internal Medicine)  Extended Emergency Contact Information Primary Emergency Contact: [facility] Address: [address]          [place], [place] [address] [name] of [place] Phone: [phone] Relation: Son  Code Status: DNR Goals of Care: Advanced Directive information Advanced Directives 10/27/2016  Does Patient Have a Medical Advance Directive? Yes  Type of Advance Directive [place]  Does patient want to make changes to medical advance directive? No - Patient declined  Copy of [place] in Chart? Yes      Chief Complaint  Patient presents with  . Establish Care    HPI: Patient is a 83 y.o. female seen today for admission to Hx of HTN, controlled on Metoprolol 50mg , elevated today, the patient denied HA, vision change, chest pain/pressure, dizziness, no noted focal weakness. Hx of hypothyroidism, taking Levothyroxine 30mcg, Alzheimer's dementia, taking Namenda Aricept for memory. Ambulates with walker, taking Naproxen 220mg  bid OA pain, she denied pain in shoulders, limbs, back.   Past Medical History:  Diagnosis Date  . Hypertension   . Thyroid disease    Past Surgical History:  Procedure Laterality Date  . ABDOMINAL HYSTERECTOMY  1986  . KNEE ARTHROSCOPY  2015/2017   Dr. Gustavus Bryant  . SPINE SURGERY  2009, 2011   Dr.Mark Nicoletta Dress    has no tobacco, alcohol, and drug history on file. Social History   Social History  . Marital status: Widowed    Spouse name: N/A  . Number of children: N/A  . Years of education: N/A   Occupational History  . Not on file.   Social History Main Topics  . Smoking status: Not on file  . Smokeless tobacco: Not on file  . Alcohol use Not on file  . Drug use: Unknown  . Sexual activity: Not on file   Other Topics Concern  . Not on file     Social History Narrative  . No narrative on file    Functional Status Survey:    Family History  Problem Relation Age of Onset  . Cancer Mother     Health Maintenance  Topic Date Due  . Samul Dada  11/23/1950  . DEXA SCAN  11/24/1998  . PNA vac Low Risk Adult (1 of 2 - PCV13) 11/24/1998  . INFLUENZA VACCINE  03/08/2016    No Known Allergies  Allergies as of 10/27/2016   No Known Allergies     Medication List       Accurate as of 10/27/16  4:51 PM. Always use your most recent med list.          cholecalciferol 1000 units tablet Commonly known as:  VITAMIN D Take 1,000 Units by mouth daily.   DULoxetine 30 MG capsule Commonly known as:  CYMBALTA Take 30 mg by mouth daily.   levothyroxine 25 MCG tablet Commonly known as:  SYNTHROID, LEVOTHROID Take 25 mcg by mouth daily before breakfast.   metoprolol succinate 50 MG 24 hr tablet Commonly known as:  TOPROL-XL Take 50 mg by mouth daily. Take with or immediately following a meal.   NAMZARIC 28-10 MG Cp24 Generic drug:  Memantine HCl-Donepezil HCl Take 1 tablet by mouth daily.   naproxen sodium 220 MG tablet Commonly known as:  ANAPROX Take 220 mg by mouth  2 (two) times daily with a meal.   traZODone 50 MG tablet Commonly known as:  DESYREL Take 50 mg by mouth at bedtime.   vitamin B-12 1000 MCG tablet Commonly known as:  CYANOCOBALAMIN Take 1,000 mcg by mouth daily.   vitamin C 500 MG tablet Commonly known as:  ASCORBIC ACID Take 500 mg by mouth daily.       Review of Systems  Constitutional: Negative for activity change, appetite change, chills, diaphoresis, fatigue and unexpected weight change.  HENT: Positive for hearing loss. Negative for congestion, dental problem, drooling, ear discharge, ear pain, facial swelling, mouth sores, nosebleeds, postnasal drip, rhinorrhea, sinus pain, sinus pressure and sore throat.   Eyes: Negative for photophobia, pain, discharge, redness, itching and visual  disturbance.  Respiratory: Negative for apnea, cough, choking, chest tightness, shortness of breath, wheezing and stridor.   Cardiovascular: Negative for chest pain, palpitations and leg swelling.  Gastrointestinal: Negative for abdominal distention, anal bleeding, blood in stool, constipation, diarrhea, nausea and vomiting.  Endocrine: Negative for cold intolerance and heat intolerance.  Genitourinary: Negative for difficulty urinating, dysuria, flank pain, frequency and urgency.  Musculoskeletal: Positive for arthralgias and gait problem. Negative for back pain, joint swelling, myalgias, neck pain and neck stiffness.  Skin: Negative for color change, pallor, rash and wound.  Allergic/Immunologic: Negative for environmental allergies and food allergies.  Neurological: Negative for dizziness, tremors, syncope, facial asymmetry, speech difficulty, weakness, light-headedness, numbness and headaches.  Hematological: Negative for adenopathy. Does not bruise/bleed easily.  Psychiatric/Behavioral: Positive for confusion and sleep disturbance. Negative for agitation, behavioral problems, decreased concentration, dysphoric mood, hallucinations, self-injury and suicidal ideas. The patient is not hyperactive.     Vitals:   10/27/16 1549  Pulse: 69  Resp: 18  Temp: 98.7 F (37.1 C)  Weight: 133 lb 12.8 oz (60.7 kg)   There is no height or weight on file to calculate BMI. Physical Exam  Constitutional: She is oriented to person, place, and time. She appears well-developed and well-nourished. No distress.  HENT:  Head: Normocephalic and atraumatic.  Right Ear: External ear normal.  Left Ear: External ear normal.  Nose: Nose normal.  Mouth/Throat: Oropharynx is clear and moist. No oropharyngeal exudate.  Eyes: Conjunctivae and EOM are normal. Pupils are equal, round, and reactive to light. Right eye exhibits no discharge. Left eye exhibits no discharge.  Neck: Normal range of motion. Neck supple.  No JVD present. No tracheal deviation present. Thyromegaly present.  Cardiovascular: Normal rate, regular rhythm and normal heart sounds.  Exam reveals no gallop.   No murmur heard. Pulmonary/Chest: Effort normal and breath sounds normal. No stridor. No respiratory distress. She has no wheezes. She has no rales.  Abdominal: Soft. Bowel sounds are normal. She exhibits no distension. There is no tenderness. There is no rebound and no guarding.  Musculoskeletal: She exhibits tenderness. She exhibits no edema.  Lymphadenopathy:    She has no cervical adenopathy.  Neurological: She is alert and oriented to person, place, and time. She has normal reflexes. She displays normal reflexes. No cranial nerve deficit. She exhibits normal muscle tone. Coordination normal.  Skin: Skin is warm and dry. No rash noted. She is not diaphoretic. No erythema. No pallor.  Psychiatric: She has a normal mood and affect. Her behavior is normal. Judgment normal.    Labs reviewed: Basic Metabolic Panel: No results for input(s): NA, K, CL, CO2, GLUCOSE, BUN, CREATININE, CALCIUM, MG, PHOS in the last 8760 hours. Liver Function Tests: No results for  input(s): AST, ALT, ALKPHOS, BILITOT, PROT, ALBUMIN in the last 8760 hours. No results for input(s): LIPASE, AMYLASE in the last 8760 hours. No results for input(s): AMMONIA in the last 8760 hours. CBC: No results for input(s): WBC, NEUTROABS, HGB, HCT, MCV, PLT in the last 8760 hours. Cardiac Enzymes: No results for input(s): CKTOTAL, CKMB, CKMBINDEX, TROPONINI in the last 8760 hours. BNP: Invalid input(s): POCBNP No results found for: HGBA1C No results found for: TSH No results found for: VITAMINB12 No results found for: FOLATE No results found for: IRON, TIBC, FERRITIN  Imaging and Procedures obtained prior to SNF admission: Patient was never admitted.  Assessment/Plan  HTN (hypertension) Controlled, continue Metoprolol 50mg  daily, elevated today, but denied  HA, dizziness, chest pain/pressure, SOB, or focal weakness, update CMP, UA C/S, CBC, Hgb a1c, TSH, MMSE  Depression, recurrent (HCC) Stable, she stated she is adjusting to [facility], frequent awakens at night, but not new. Continue Cymbalta 30mg , Trazodone 50mg , observe.   Alzheimer disease AL for living, update MMSE, continue Namenda, Aricept.   Hypothyroidism Taking Levothyroxine 15mcg daily, update TSH  Osteoarthritis Multiple sites, ambulates with walker, taking naproxen 220mg  bid.  Family/ staff Communication: AL  Labs/tests ordered: CBC CMP TSH UA C/S Hgb a1c MMSE

## 2016-10-27 NOTE — Assessment & Plan Note (Signed)
Controlled, continue Metoprolol 50mg  daily, elevated today, but denied HA, dizziness, chest pain/pressure, SOB, or focal weakness, update CMP, UA C/S, CBC, Hgb a1c, TSH, MMSE

## 2016-11-01 DIAGNOSIS — G309 Alzheimer's disease, unspecified: Secondary | ICD-10-CM | POA: Diagnosis not present

## 2016-11-01 DIAGNOSIS — I10 Essential (primary) hypertension: Secondary | ICD-10-CM | POA: Diagnosis not present

## 2016-11-01 LAB — HEPATIC FUNCTION PANEL
ALK PHOS: 66 U/L (ref 25–125)
ALT: 6 U/L — AB (ref 7–35)
AST: 11 U/L — AB (ref 13–35)
BILIRUBIN, TOTAL: 0.4 mg/dL

## 2016-11-01 LAB — HEMOGLOBIN A1C: HEMOGLOBIN A1C: 5.3

## 2016-11-01 LAB — BASIC METABOLIC PANEL
BUN: 30 mg/dL — AB (ref 4–21)
CREATININE: 1.5 mg/dL — AB (ref <=1.1)
Glucose: 89 mg/dL
Potassium: 4.7 mmol/L (ref 3.4–5.3)
Sodium: 138 mmol/L (ref 137–147)

## 2016-11-01 LAB — CBC AND DIFFERENTIAL
HCT: 33 % — AB (ref 36–46)
Hemoglobin: 11.3 g/dL — AB (ref 12.0–16.0)
Platelets: 264 10*3/uL (ref 150–399)
WBC: 6.7 10*3/mL

## 2016-11-01 LAB — TSH: TSH: 0.86 u[IU]/mL (ref <=5.90)

## 2016-11-02 ENCOUNTER — Encounter: Payer: Self-pay | Admitting: Nurse Practitioner

## 2016-11-02 ENCOUNTER — Other Ambulatory Visit: Payer: Self-pay | Admitting: *Deleted

## 2016-11-02 DIAGNOSIS — R278 Other lack of coordination: Secondary | ICD-10-CM | POA: Diagnosis not present

## 2016-11-02 DIAGNOSIS — R1312 Dysphagia, oropharyngeal phase: Secondary | ICD-10-CM | POA: Diagnosis not present

## 2016-11-02 DIAGNOSIS — R41841 Cognitive communication deficit: Secondary | ICD-10-CM | POA: Diagnosis not present

## 2016-11-02 DIAGNOSIS — N185 Chronic kidney disease, stage 5: Secondary | ICD-10-CM

## 2016-11-02 DIAGNOSIS — I120 Hypertensive chronic kidney disease with stage 5 chronic kidney disease or end stage renal disease: Secondary | ICD-10-CM

## 2016-11-02 DIAGNOSIS — I129 Hypertensive chronic kidney disease with stage 1 through stage 4 chronic kidney disease, or unspecified chronic kidney disease: Secondary | ICD-10-CM | POA: Insufficient documentation

## 2016-11-02 DIAGNOSIS — M6281 Muscle weakness (generalized): Secondary | ICD-10-CM | POA: Diagnosis not present

## 2016-11-02 HISTORY — DX: Chronic kidney disease, stage 5: N18.5

## 2016-11-02 HISTORY — DX: Hypertensive chronic kidney disease with stage 5 chronic kidney disease or end stage renal disease: I12.0

## 2016-11-03 DIAGNOSIS — R1312 Dysphagia, oropharyngeal phase: Secondary | ICD-10-CM | POA: Diagnosis not present

## 2016-11-03 DIAGNOSIS — M6281 Muscle weakness (generalized): Secondary | ICD-10-CM | POA: Diagnosis not present

## 2016-11-03 DIAGNOSIS — R41841 Cognitive communication deficit: Secondary | ICD-10-CM | POA: Diagnosis not present

## 2016-11-03 DIAGNOSIS — R278 Other lack of coordination: Secondary | ICD-10-CM | POA: Diagnosis not present

## 2016-11-04 DIAGNOSIS — R41841 Cognitive communication deficit: Secondary | ICD-10-CM | POA: Diagnosis not present

## 2016-11-04 DIAGNOSIS — R278 Other lack of coordination: Secondary | ICD-10-CM | POA: Diagnosis not present

## 2016-11-04 DIAGNOSIS — R1312 Dysphagia, oropharyngeal phase: Secondary | ICD-10-CM | POA: Diagnosis not present

## 2016-11-04 DIAGNOSIS — M6281 Muscle weakness (generalized): Secondary | ICD-10-CM | POA: Diagnosis not present

## 2016-11-10 ENCOUNTER — Non-Acute Institutional Stay: Payer: Medicare Other | Admitting: Internal Medicine

## 2016-11-10 ENCOUNTER — Encounter: Payer: Self-pay | Admitting: Internal Medicine

## 2016-11-10 DIAGNOSIS — G309 Alzheimer's disease, unspecified: Secondary | ICD-10-CM | POA: Diagnosis not present

## 2016-11-10 DIAGNOSIS — F339 Major depressive disorder, recurrent, unspecified: Secondary | ICD-10-CM

## 2016-11-10 DIAGNOSIS — E039 Hypothyroidism, unspecified: Secondary | ICD-10-CM | POA: Diagnosis not present

## 2016-11-10 DIAGNOSIS — I10 Essential (primary) hypertension: Secondary | ICD-10-CM | POA: Diagnosis not present

## 2016-11-10 DIAGNOSIS — F0280 Dementia in other diseases classified elsewhere without behavioral disturbance: Secondary | ICD-10-CM

## 2016-11-10 NOTE — Progress Notes (Signed)
History and Physical    Location:  [place] Room Number: [place] of Service:  ALF [phone]) Provider:  Jeanmarie Hubert, MD  Patient Care Team: Estill Dooms, MD as PCP - General (Internal Medicine)  Extended Emergency Contact Information Primary Emergency Contact: [facility] Address: [address]          [place], [place] [address] [name] of [place] Phone: [phone] Relation: Son  Code Status:  DNR Goals of care: Advanced Directive information Advanced Directives 11/10/2016  Does Patient Have a Medical Advance Directive? Yes  Type of Paramedic of [place];Out of facility DNR (pink MOST or yellow form)  Does patient want to make changes to medical advance directive? -  Copy of [place] in Chart? Yes  Pre-existing out of facility DNR order (yellow form or pink MOST form) Yellow form placed in chart (order not valid for inpatient use)     Chief Complaint  Patient presents with  . Medical Management of Chronic Issues    New patient admitted to AL 10/21/16    HPI:  Pt is a 83 y.o. female admitted to [facility] on 10/21/16, who is seen today for medical management of chronic diseases.    Alzheimer's dementia without behavioral disturbance, unspecified timing of dementia onset - taking Namzaric  Depression, recurrent ([place]) - on Cymbalta and trazadone  Hypertension, unspecified type - controlled  Hypothyroidism, unspecified type - compensated     Past Medical History:  Diagnosis Date  . Alzheimer disease 10/27/2016  . Depression, recurrent ([place]) 10/27/2016  . HTN (hypertension) 10/27/2016  . Hypertension   . Hypertensive kidney disease with CKD (chronic kidney disease) stage V ([place]) 11/02/2016   11/02/16 Na 136, K 4.7, Bun 30, creat 1.52, TSH 0.86, wbc 6.7, Hgb 11.3, plt 264  . Hypothyroidism 10/27/2016  . Osteoarthritis 10/27/2016  . Thyroid disease    Past Surgical History:  Procedure  Laterality Date  . ABDOMINAL HYSTERECTOMY  1986  . KNEE ARTHROSCOPY  2015/2017   Dr. Gustavus Bryant  . SPINE SURGERY  2009, 2011   Dr.Mark Nicoletta Dress    No Known Allergies  Allergies as of 11/10/2016   No Known Allergies     Medication List       Accurate as of 11/10/16 10:47 AM. Always use your most recent med list.          cholecalciferol 1000 units tablet Commonly known as:  VITAMIN D Take 1,000 Units by mouth daily.   DULoxetine 30 MG capsule Commonly known as:  CYMBALTA Take 30 mg by mouth daily.   levothyroxine 25 MCG tablet Commonly known as:  SYNTHROID, LEVOTHROID Take 25 mcg by mouth daily before breakfast.   lisinopril 40 MG tablet Commonly known as:  PRINIVIL,ZESTRIL Take 40 mg by mouth. Take one tablet daily for blood pressure   metoprolol succinate 50 MG 24 hr tablet Commonly known as:  TOPROL-XL Take 50 mg by mouth daily. Take with or immediately following a meal.   NAMZARIC 28-10 MG Cp24 Generic drug:  Memantine HCl-Donepezil HCl Take 1 tablet by mouth daily.   naproxen sodium 220 MG tablet Commonly known as:  ANAPROX Take 220 mg by mouth 2 (two) times daily with a meal.   traZODone 50 MG tablet Commonly known as:  DESYREL Take 50 mg by mouth at bedtime.   vitamin B-12 1000 MCG tablet Commonly known as:  CYANOCOBALAMIN Take 1,000 mcg by mouth daily.   vitamin C  500 MG tablet Commonly known as:  ASCORBIC ACID Take 500 mg by mouth daily.       Review of Systems  Constitutional: Negative for activity change, appetite change, chills, diaphoresis, fatigue and unexpected weight change.  HENT: Positive for hearing loss. Negative for congestion, dental problem, drooling, ear discharge, ear pain, facial swelling, mouth sores, nosebleeds, postnasal drip, rhinorrhea, sinus pain, sinus pressure and sore throat.   Eyes: Negative for photophobia, pain, discharge, redness, itching and visual disturbance.  Respiratory: Negative for apnea, cough, choking, chest  tightness, shortness of breath, wheezing and stridor.   Cardiovascular: Negative for chest pain, palpitations and leg swelling.  Gastrointestinal: Negative for abdominal distention, anal bleeding, blood in stool, constipation, diarrhea, nausea and vomiting.  Endocrine: Negative for cold intolerance and heat intolerance.       Hypothyroid  Genitourinary: Negative for difficulty urinating, dysuria, flank pain, frequency and urgency.  Musculoskeletal: Positive for arthralgias and gait problem. Negative for back pain, joint swelling, myalgias, neck pain and neck stiffness.  Skin: Negative for color change, pallor, rash and wound.  Allergic/Immunologic: Negative for environmental allergies and food allergies.  Neurological: Negative for dizziness, tremors, syncope, facial asymmetry, speech difficulty, weakness, light-headedness, numbness and headaches.       Dementia  Hematological: Negative for adenopathy. Does not bruise/bleed easily.  Psychiatric/Behavioral: Positive for confusion, decreased concentration and sleep disturbance. Negative for agitation, behavioral problems, dysphoric mood, hallucinations, self-injury and suicidal ideas. The patient is not hyperactive.      There is no immunization history on file for this patient. Pertinent  Health Maintenance Due  Topic Date Due  . DEXA SCAN  11/24/1998  . PNA vac Low Risk Adult (1 of 2 - PCV13) 11/24/1998  . INFLUENZA VACCINE  03/08/2017     Vitals:   11/10/16 1010  BP: (!) 160/80  Pulse: 60  Resp: 16  Temp: 98.4 F (36.9 C)  Weight: 131 lb (59.4 kg)  Height: 5\' 2"  (1.575 m)   Body mass index is 23.96 kg/m. Physical Exam  Constitutional: She appears well-developed and well-nourished. No distress.  HENT:  Head: Normocephalic and atraumatic.  Right Ear: External ear normal.  Left Ear: External ear normal.  Nose: Nose normal.  Mouth/Throat: Oropharynx is clear and moist. No oropharyngeal exudate.  Eyes: Conjunctivae and EOM  are normal. Pupils are equal, round, and reactive to light. Right eye exhibits no discharge. Left eye exhibits no discharge.  Neck: Normal range of motion. Neck supple. No JVD present. No tracheal deviation present. Thyromegaly present.  Cardiovascular: Normal rate, regular rhythm and normal heart sounds.  Exam reveals no gallop.   No murmur heard. Pulmonary/Chest: Effort normal and breath sounds normal. No stridor. No respiratory distress. She has no wheezes. She has no rales.  Abdominal: Soft. Bowel sounds are normal. She exhibits no distension. There is no tenderness. There is no rebound and no guarding.  Musculoskeletal: She exhibits tenderness. She exhibits no edema.  Lymphadenopathy:    She has no cervical adenopathy.  Neurological: She is alert. She has normal reflexes. She displays normal reflexes. No cranial nerve deficit. She exhibits normal muscle tone. Coordination normal.  Demented, but able to engage in conversation  Skin: Skin is warm and dry. No rash noted. She is not diaphoretic. No erythema. No pallor.  Psychiatric: She has a normal mood and affect. Her behavior is normal. Judgment normal.    Labs reviewed:  Recent Labs  11/01/16  NA 138  K 4.7  BUN 30*  CREATININE  1.5*    Recent Labs  11/01/16  AST 11*  ALT 6*  ALKPHOS 66    Recent Labs  11/01/16  WBC 6.7  HGB 11.3*  HCT 33*  PLT 264   Lab Results  Component Value Date   TSH 0.86 11/01/2016   Lab Results  Component Value Date   HGBA1C 5.3 11/01/2016   Assessment/Plan 1. Alzheimer's dementia without behavioral disturbance, unspecified timing of dementia onset The current medical regimen is effective;  continue present plan and medications.  2. Depression, recurrent ([place]) The current medical regimen is effective;  continue present plan and medications.  3. Hypertension, unspecified type The current medical regimen is effective;  continue present plan and medications.  4. Hypothyroidism,  unspecified type The current medical regimen is effective;  continue present plan and medications.

## 2016-12-08 ENCOUNTER — Encounter: Payer: Self-pay | Admitting: Nurse Practitioner

## 2016-12-08 ENCOUNTER — Non-Acute Institutional Stay: Payer: Medicare Other | Admitting: Nurse Practitioner

## 2016-12-08 DIAGNOSIS — I10 Essential (primary) hypertension: Secondary | ICD-10-CM | POA: Diagnosis not present

## 2016-12-08 DIAGNOSIS — N185 Chronic kidney disease, stage 5: Secondary | ICD-10-CM

## 2016-12-08 DIAGNOSIS — I120 Hypertensive chronic kidney disease with stage 5 chronic kidney disease or end stage renal disease: Secondary | ICD-10-CM | POA: Diagnosis not present

## 2016-12-08 DIAGNOSIS — G309 Alzheimer's disease, unspecified: Secondary | ICD-10-CM | POA: Diagnosis not present

## 2016-12-08 DIAGNOSIS — F339 Major depressive disorder, recurrent, unspecified: Secondary | ICD-10-CM

## 2016-12-08 DIAGNOSIS — E039 Hypothyroidism, unspecified: Secondary | ICD-10-CM | POA: Diagnosis not present

## 2016-12-08 DIAGNOSIS — M1990 Unspecified osteoarthritis, unspecified site: Secondary | ICD-10-CM | POA: Diagnosis not present

## 2016-12-08 DIAGNOSIS — F0280 Dementia in other diseases classified elsewhere without behavioral disturbance: Secondary | ICD-10-CM | POA: Diagnosis not present

## 2016-12-08 NOTE — Assessment & Plan Note (Signed)
Multiple sites, continue prn Aleve 220mg  bid prn

## 2016-12-08 NOTE — Progress Notes (Signed)
Location:  [facility] Room Number: 161 Place of Service:  ALF [phone]) Provider:  Mast, Manxie  NP  Jeanmarie Hubert, MD  Patient Care Team: Estill Dooms, MD as PCP - General (Internal Medicine)  Extended Emergency Contact Information Primary Emergency Contact: [facility] Address: [address]          [place], [place] [address] [name] of [place] Phone: [phone] Relation: Son  Code Status:  DNR Goals of care: Advanced Directive information Advanced Directives 11/10/2016  Does Patient Have a Medical Advance Directive? Yes  Type of Paramedic of [place];Out of facility DNR (pink MOST or yellow form)  Does patient want to make changes to medical advance directive? -  Copy of [place] in Chart? Yes  Pre-existing out of facility DNR order (yellow form or pink MOST form) Yellow form placed in chart (order not valid for inpatient use)     Chief Complaint  Patient presents with  . Medical Management of Chronic Issues    HPI:  Pt is a 83 y.o. female seen today for medical management of chronic diseases.     Hx of HTN, controlled on Metoprolol 50mg , Lisinopril 40mg  qd.Hx of hypothyroidism, taking Levothyroxine 92mcg, Alzheimer's dementia, taking Namenda Aricept for memory. Ambulates with walker, taking Naproxen 220mg  bid OA pain, she denied pain in shoulders, limbs, back.    Past Medical History:  Diagnosis Date  . Alzheimer disease 10/27/2016  . Depression, recurrent ([place]) 10/27/2016  . HTN (hypertension) 10/27/2016  . Hypertension   . Hypertensive kidney disease with CKD (chronic kidney disease) stage V ([place]) 11/02/2016   11/02/16 Na 136, K 4.7, Bun 30, creat 1.52, TSH 0.86, wbc 6.7, Hgb 11.3, plt 264  . Hypothyroidism 10/27/2016  . Osteoarthritis 10/27/2016  . Thyroid disease    Past Surgical History:  Procedure Laterality Date  . ABDOMINAL HYSTERECTOMY  1986  . KNEE ARTHROSCOPY  2015/2017   Dr.  Gustavus Bryant  . SPINE SURGERY  2009, 2011   Dr.Mark Nicoletta Dress    No Known Allergies  Outpatient Encounter Prescriptions as of 12/08/2016  Medication Sig  . cholecalciferol (VITAMIN D) 1000 units tablet Take 1,000 Units by mouth daily.  . DULoxetine (CYMBALTA) 30 MG capsule Take 30 mg by mouth daily.  Marland Kitchen levothyroxine (SYNTHROID, LEVOTHROID) 25 MCG tablet Take 25 mcg by mouth daily before breakfast.  . lisinopril (PRINIVIL,ZESTRIL) 40 MG tablet Take 40 mg by mouth. Take one tablet daily for blood pressure  . Memantine HCl-Donepezil HCl (NAMZARIC) 28-10 MG CP24 Take 1 tablet by mouth daily.  . metoprolol succinate (TOPROL-XL) 50 MG 24 hr tablet Take 50 mg by mouth daily. Take with or immediately following a meal.  . naproxen sodium (ANAPROX) 220 MG tablet Take 220 mg by mouth 2 (two) times daily with a meal.  . traZODone (DESYREL) 50 MG tablet Take 50 mg by mouth at bedtime.  . vitamin B-12 (CYANOCOBALAMIN) 1000 MCG tablet Take 1,000 mcg by mouth daily.  . vitamin C (ASCORBIC ACID) 500 MG tablet Take 500 mg by mouth daily.   No facility-administered encounter medications on file as of 12/08/2016.     Review of Systems  Constitutional: Negative for activity change, appetite change, chills, diaphoresis, fatigue and unexpected weight change.  HENT: Positive for hearing loss. Negative for congestion, dental problem, drooling, ear discharge, ear pain, facial swelling, mouth sores, nosebleeds, postnasal drip, rhinorrhea, sinus pain, sinus pressure and sore throat.   Eyes: Negative for photophobia, pain,  discharge, redness, itching and visual disturbance.  Respiratory: Negative for apnea, cough, choking, chest tightness, shortness of breath, wheezing and stridor.   Cardiovascular: Negative for chest pain, palpitations and leg swelling.  Gastrointestinal: Negative for abdominal distention, anal bleeding, blood in stool, constipation, diarrhea, nausea and vomiting.  Endocrine: Negative for cold intolerance and heat  intolerance.       Hypothyroid  Genitourinary: Negative for difficulty urinating, dysuria, flank pain, frequency and urgency.  Musculoskeletal: Positive for arthralgias and gait problem. Negative for back pain, joint swelling, myalgias, neck pain and neck stiffness.  Skin: Negative for color change, pallor, rash and wound.  Allergic/Immunologic: Negative for environmental allergies and food allergies.  Neurological: Negative for dizziness, tremors, syncope, facial asymmetry, speech difficulty, weakness, light-headedness, numbness and headaches.       Dementia  Hematological: Negative for adenopathy. Does not bruise/bleed easily.  Psychiatric/Behavioral: Positive for confusion, decreased concentration and sleep disturbance. Negative for agitation, behavioral problems, dysphoric mood, hallucinations, self-injury and suicidal ideas. The patient is not hyperactive.      There is no immunization history on file for this patient. Pertinent  Health Maintenance Due  Topic Date Due  . DEXA SCAN  11/24/1998  . PNA vac Low Risk Adult (1 of 2 - PCV13) 11/24/1998  . INFLUENZA VACCINE  03/08/2017   No flowsheet data found. Functional Status Survey:    Vitals:   12/08/16 1122  BP: (!) 142/64  Pulse: 74  Resp: 20  Temp: 98.2 F (36.8 C)  Weight: 133 lb 8 oz (60.6 kg)  Height: 5\' 2"  (1.575 m)   Body mass index is 24.42 kg/m. Physical Exam  Constitutional: She appears well-developed and well-nourished. No distress.  HENT:  Head: Normocephalic and atraumatic.  Right Ear: External ear normal.  Left Ear: External ear normal.  Nose: Nose normal.  Mouth/Throat: Oropharynx is clear and moist. No oropharyngeal exudate.  Eyes: Conjunctivae and EOM are normal. Pupils are equal, round, and reactive to light. Right eye exhibits no discharge. Left eye exhibits no discharge.  Neck: Normal range of motion. Neck supple. No JVD present. No tracheal deviation present. Thyromegaly present.  Cardiovascular:  Normal rate, regular rhythm and normal heart sounds.  Exam reveals no gallop.   No murmur heard. Pulmonary/Chest: Effort normal and breath sounds normal. No stridor. No respiratory distress. She has no wheezes. She has no rales.  Abdominal: Soft. Bowel sounds are normal. She exhibits no distension. There is no tenderness. There is no rebound and no guarding.  Musculoskeletal: She exhibits tenderness. She exhibits no edema.  Lymphadenopathy:    She has no cervical adenopathy.  Neurological: She is alert. She has normal reflexes. She displays normal reflexes. No cranial nerve deficit. She exhibits normal muscle tone. Coordination normal.  Demented, but able to engage in conversation  Skin: Skin is warm and dry. No rash noted. She is not diaphoretic. No erythema. No pallor.  Psychiatric: She has a normal mood and affect. Her behavior is normal. Judgment normal.    Labs reviewed:  Recent Labs  11/01/16  NA 138  K 4.7  BUN 30*  CREATININE 1.5*    Recent Labs  11/01/16  AST 11*  ALT 6*  ALKPHOS 66    Recent Labs  11/01/16  WBC 6.7  HGB 11.3*  HCT 33*  PLT 264   Lab Results  Component Value Date   TSH 0.86 11/01/2016   Lab Results  Component Value Date   HGBA1C 5.3 11/01/2016   No results found  for: CHOL, HDL, LDLCALC, LDLDIRECT, TRIG, CHOLHDL  Significant Diagnostic Results in last 30 days:  No results found.  Assessment/Plan HTN (hypertension) Controlled blood pressure, continue Metoprolol 50mg , Lisinopril 40mg  qd. 11/01/16 Na 138, K 4.7, Bun 30, creat 1.5  Hypothyroidism Last TSH 0.86 11/02/16, continue Levothyroxine 95mcg daily  Alzheimer disease AL for living, continue Namenda, Aricept.   Osteoarthritis Multiple sites, continue prn Aleve 220mg  bid prn  Hypertensive kidney disease with CKD (chronic kidney disease) stage V ([place]) 11/02/16 Na 136, K 4.7, Bun 30, creat 1.52, TSH 0.86, wbc 6.7, Hgb 11.3, plt 264  Depression, recurrent (HCC) Stable. Continue  Cymbalta 30mg , Trazodone 50mg , observe.      Family/ staff Communication: AL  Labs/tests ordered:  none

## 2016-12-08 NOTE — Assessment & Plan Note (Signed)
Stable. Continue Cymbalta 30mg , Trazodone 50mg , observe.

## 2016-12-08 NOTE — Assessment & Plan Note (Addendum)
Controlled blood pressure, continue Metoprolol 50mg , Lisinopril 40mg  qd. 11/01/16 Na 138, K 4.7, Bun 30, creat 1.5

## 2016-12-08 NOTE — Assessment & Plan Note (Signed)
Last TSH 0.86 11/02/16, continue Levothyroxine 51mcg daily

## 2016-12-08 NOTE — Assessment & Plan Note (Signed)
11/02/16 Na 136, K 4.7, Bun 30, creat 1.52, TSH 0.86, wbc 6.7, Hgb 11.3, plt 264

## 2016-12-08 NOTE — Assessment & Plan Note (Signed)
AL for living, continue Namenda, Aricept.

## 2017-02-02 ENCOUNTER — Other Ambulatory Visit: Payer: Self-pay | Admitting: *Deleted

## 2017-02-02 ENCOUNTER — Non-Acute Institutional Stay: Payer: Medicare Other | Admitting: Nurse Practitioner

## 2017-02-02 DIAGNOSIS — I10 Essential (primary) hypertension: Secondary | ICD-10-CM

## 2017-02-02 DIAGNOSIS — E039 Hypothyroidism, unspecified: Secondary | ICD-10-CM | POA: Diagnosis not present

## 2017-02-02 DIAGNOSIS — N185 Chronic kidney disease, stage 5: Secondary | ICD-10-CM

## 2017-02-02 DIAGNOSIS — I120 Hypertensive chronic kidney disease with stage 5 chronic kidney disease or end stage renal disease: Secondary | ICD-10-CM

## 2017-02-02 DIAGNOSIS — F0280 Dementia in other diseases classified elsewhere without behavioral disturbance: Secondary | ICD-10-CM | POA: Diagnosis not present

## 2017-02-02 DIAGNOSIS — M1990 Unspecified osteoarthritis, unspecified site: Secondary | ICD-10-CM

## 2017-02-02 DIAGNOSIS — G309 Alzheimer's disease, unspecified: Secondary | ICD-10-CM | POA: Diagnosis not present

## 2017-02-02 DIAGNOSIS — F339 Major depressive disorder, recurrent, unspecified: Secondary | ICD-10-CM

## 2017-02-02 LAB — BASIC METABOLIC PANEL
BUN: 19 (ref 4–21)
CREATININE: 1.2 — AB (ref <=1.1)
GLUCOSE: 89
POTASSIUM: 4.2 (ref 3.4–5.3)
SODIUM: 140 (ref 137–147)

## 2017-02-02 NOTE — Assessment & Plan Note (Signed)
ast TSH 0.86 11/02/16, continue Levothyroxine 36mcg daily

## 2017-02-02 NOTE — Assessment & Plan Note (Signed)
02/02/17 Na 140, K 4.2, Bun 19 creat 1.17

## 2017-02-02 NOTE — Assessment & Plan Note (Addendum)
Chronic issue, frequent awakens at night, but she stated she gets enough rest, continue Cymbalta 30mg , Trazodone 50mg , observe. Not happy here, just because her son in near, she wishes staying [place] where she was from. Difficulty adjusting. Encourage participation of activities at [facility]

## 2017-02-02 NOTE — Assessment & Plan Note (Signed)
AL for living, continue Namenda, Aricept.

## 2017-02-02 NOTE — Progress Notes (Signed)
Location:  [facility] Room Number: 222 Place of Service:  ALF [phone]) Provider:  Jacie Tristan, Manxie  NP  Blanchie Serve, MD  Patient Care Team: Blanchie Serve, MD as PCP - General (Internal Medicine)  Extended Emergency Contact Information Primary Emergency Contact: [facility] Address: [address]          [place], [place] [address] [name] of [place] Phone: [phone] Relation: Son  Code Status:  DNR Goals of care: Advanced Directive information Advanced Directives 11/10/2016  Does Patient Have a Medical Advance Directive? Yes  Type of Paramedic of [place];Out of facility DNR (pink MOST or yellow form)  Does patient want to make changes to medical advance directive? -  Copy of [place] in Chart? Yes  Pre-existing out of facility DNR order (yellow form or pink MOST form) Yellow form placed in chart (order not valid for inpatient use)     Chief Complaint  Patient presents with  . Medical Management of Chronic Issues    HPI:  Pt is a 83 y.o. female seen today for medical management of chronic diseases.     Hx of HTN, not controlled on Metoprolol 50mg , Lisinopril 40mg  qd.Hx of hypothyroidism, taking Levothyroxine 89mcg, Alzheimer's dementia, taking Namenda Aricept for memory. Ambulates with walker, taking Naproxen 220mg  bid OA pain, she denied pain in shoulders, limbs, back.    Past Medical History:  Diagnosis Date  . Alzheimer disease 10/27/2016  . Depression, recurrent ([place]) 10/27/2016  . HTN (hypertension) 10/27/2016  . Hypertension   . Hypertensive kidney disease with CKD (chronic kidney disease) stage V ([place]) 11/02/2016   11/02/16 Na 136, K 4.7, Bun 30, creat 1.52, TSH 0.86, wbc 6.7, Hgb 11.3, plt 264  . Hypothyroidism 10/27/2016  . Osteoarthritis 10/27/2016  . Thyroid disease    Past Surgical History:  Procedure Laterality Date  . ABDOMINAL HYSTERECTOMY  1986  . KNEE ARTHROSCOPY  2015/2017     Dr. Gustavus Bryant  . SPINE SURGERY  2009, 2011   Dr.Mark Nicoletta Dress    No Known Allergies  Outpatient Encounter Prescriptions as of 02/02/2017  Medication Sig  . cholecalciferol (VITAMIN D) 1000 units tablet Take 1,000 Units by mouth daily.  . DULoxetine (CYMBALTA) 30 MG capsule Take 30 mg by mouth daily.  Marland Kitchen levothyroxine (SYNTHROID, LEVOTHROID) 25 MCG tablet Take 25 mcg by mouth daily before breakfast.  . lisinopril (PRINIVIL,ZESTRIL) 40 MG tablet Take 40 mg by mouth. Take one tablet daily for blood pressure  . Memantine HCl-Donepezil HCl (NAMZARIC) 28-10 MG CP24 Take 1 tablet by mouth daily.  . metoprolol succinate (TOPROL-XL) 50 MG 24 hr tablet Take 50 mg by mouth daily. Take with or immediately following a meal.  . naproxen sodium (ANAPROX) 220 MG tablet Take 220 mg by mouth 2 (two) times daily with a meal.  . traZODone (DESYREL) 50 MG tablet Take 50 mg by mouth at bedtime.  . vitamin B-12 (CYANOCOBALAMIN) 1000 MCG tablet Take 1,000 mcg by mouth daily.  . vitamin C (ASCORBIC ACID) 500 MG tablet Take 500 mg by mouth daily.   No facility-administered encounter medications on file as of 02/02/2017.     Review of Systems  Constitutional: Negative for activity change, appetite change, chills, diaphoresis, fatigue and unexpected weight change.  HENT: Positive for hearing loss. Negative for congestion, dental problem, drooling, ear discharge, ear pain, facial swelling, mouth sores, nosebleeds, postnasal drip, rhinorrhea, sinus pain, sinus pressure and sore throat.   Eyes: Negative for photophobia,  pain, discharge, redness, itching and visual disturbance.  Respiratory: Negative for apnea, cough, choking, chest tightness, shortness of breath, wheezing and stridor.   Cardiovascular: Negative for chest pain, palpitations and leg swelling.  Gastrointestinal: Negative for abdominal distention, anal bleeding, blood in stool, constipation, diarrhea, nausea and vomiting.  Endocrine: Negative for cold intolerance  and heat intolerance.       Hypothyroid  Genitourinary: Negative for difficulty urinating, dysuria, flank pain, frequency and urgency.  Musculoskeletal: Positive for arthralgias and gait problem. Negative for back pain, joint swelling, myalgias, neck pain and neck stiffness.  Skin: Negative for color change, pallor, rash and wound.  Allergic/Immunologic: Negative for environmental allergies and food allergies.  Neurological: Negative for dizziness, tremors, syncope, facial asymmetry, speech difficulty, weakness, light-headedness, numbness and headaches.       Dementia  Hematological: Negative for adenopathy. Does not bruise/bleed easily.  Psychiatric/Behavioral: Positive for confusion, decreased concentration and sleep disturbance. Negative for agitation, behavioral problems, dysphoric mood, hallucinations, self-injury and suicidal ideas. The patient is not hyperactive.      There is no immunization history on file for this patient. Pertinent  Health Maintenance Due  Topic Date Due  . DEXA SCAN  11/24/1998  . PNA vac Low Risk Adult (1 of 2 - PCV13) 11/24/1998  . INFLUENZA VACCINE  03/08/2017   No flowsheet data found. Functional Status Survey:    Vitals:   02/02/17 1537  Pulse: 66  Resp: 20  Temp: 98.4 F (36.9 C)  SpO2: 98%  Weight: 139 lb 9.6 oz (63.3 kg)  Height: 5\' 2"  (1.575 m)   Body mass index is 25.53 kg/m. Physical Exam  Constitutional: She appears well-developed and well-nourished. No distress.  HENT:  Head: Normocephalic and atraumatic.  Right Ear: External ear normal.  Left Ear: External ear normal.  Nose: Nose normal.  Mouth/Throat: Oropharynx is clear and moist. No oropharyngeal exudate.  Eyes: Conjunctivae and EOM are normal. Pupils are equal, round, and reactive to light. Right eye exhibits no discharge. Left eye exhibits no discharge.  Neck: Normal range of motion. Neck supple. No JVD present. No tracheal deviation present. Thyromegaly present.    Cardiovascular: Normal rate, regular rhythm and normal heart sounds.  Exam reveals no gallop.   No murmur heard. Pulmonary/Chest: Effort normal and breath sounds normal. No stridor. No respiratory distress. She has no wheezes. She has no rales.  Abdominal: Soft. Bowel sounds are normal. She exhibits no distension. There is no tenderness. There is no rebound and no guarding.  Musculoskeletal: She exhibits tenderness. She exhibits no edema.  Lymphadenopathy:    She has no cervical adenopathy.  Neurological: She is alert. She has normal reflexes. No cranial nerve deficit. She exhibits normal muscle tone. Coordination normal.  Demented, but able to engage in conversation  Skin: Skin is warm and dry. No rash noted. She is not diaphoretic. No erythema. No pallor.  Psychiatric: Her behavior is normal.  Crying, sad, confused    Labs reviewed:  Recent Labs  11/01/16 02/02/17  NA 138 140  K 4.7 4.2  BUN 30* 19  CREATININE 1.5* 1.2*    Recent Labs  11/01/16  AST 11*  ALT 6*  ALKPHOS 66    Recent Labs  11/01/16  WBC 6.7  HGB 11.3*  HCT 33*  PLT 264   Lab Results  Component Value Date   TSH 0.86 11/01/2016   Lab Results  Component Value Date   HGBA1C 5.3 11/01/2016   No results found for: CHOL, HDL,  LDLCALC, LDLDIRECT, TRIG, CHOLHDL  Significant Diagnostic Results in last 30 days:  No results found.  Assessment/Plan Depression, recurrent (HCC) Chronic issue, frequent awakens at night, but she stated she gets enough rest, continue Cymbalta 30mg , Trazodone 50mg , observe. Not happy here, just because her son in near, she wishes staying [place] where she was from. Difficulty adjusting. Encourage [facility] of activities at [facility]  Hypertensive kidney disease with CKD (chronic kidney disease) stage V ([place]) 02/02/17 Na 140, K 4.2, Bun 19 creat 1.17   HTN (hypertension) Uncontrolled blood pressure, increase  Metoprolol 50mg  bid, continue Lisinopril 40mg  qd. VS q shift.    Hypothyroidism ast TSH 0.86 11/02/16, continue Levothyroxine 27mcg daily  Alzheimer disease AL for living, continue Namenda, Aricept.    Osteoarthritis Multiple sites, continue prn Aleve 220mg  bid prn     Family/ staff Communication: AL  Labs/tests ordered:  none

## 2017-02-02 NOTE — Assessment & Plan Note (Signed)
Multiple sites, continue prn Aleve 220mg  bid prn

## 2017-02-02 NOTE — Assessment & Plan Note (Signed)
Uncontrolled blood pressure, increase  Metoprolol 50mg  bid, continue Lisinopril 40mg  qd. VS q shift.

## 2017-02-03 ENCOUNTER — Other Ambulatory Visit: Payer: Self-pay | Admitting: *Deleted

## 2017-04-25 ENCOUNTER — Non-Acute Institutional Stay: Payer: Medicare Other

## 2017-04-25 DIAGNOSIS — Z0000 Encounter for general adult medical examination without abnormal findings: Secondary | ICD-10-CM | POA: Diagnosis not present

## 2017-04-25 NOTE — Patient Instructions (Signed)
Haley Cohen , Thank you for taking time to come for your Medicare Wellness Visit. I appreciate your ongoing commitment to your health goals. Please review the following plan we discussed and let me know if I can assist you in the future.   Screening recommendations/referrals: Colonoscopy excluded, pt over age 81 Mammogram excluded, pt over age 78 Bone Density due Recommended yearly ophthalmology/optometry visit for glaucoma screening and checkup Recommended yearly dental visit for hygiene and checkup  Vaccinations: Influenza vaccine due Pneumococcal vaccine 13 due Tdap vaccine due Shingles vaccine not in records    Advanced directives: DNR in chart, copies of health care power of attorney and living will are needed   Conditions/risks identified: None  Next appointment: Dr. Bubba Camp makes rounds   Preventive Care 66 Years and Older, Female Preventive care refers to lifestyle choices and visits with your health care provider that can promote health and wellness. What does preventive care include?  A yearly physical exam. This is also called an annual well check.  Dental exams once or twice a year.  Routine eye exams. Ask your health care provider how often you should have your eyes checked.  Personal lifestyle choices, including:  Daily care of your teeth and gums.  Regular physical activity.  Eating a healthy diet.  Avoiding tobacco and drug use.  Limiting alcohol use.  Practicing safe sex.  Taking low-dose aspirin every day.  Taking vitamin and mineral supplements as recommended by your health care provider. What happens during an annual well check? The services and screenings done by your health care provider during your annual well check will depend on your age, overall health, lifestyle risk factors, and family history of disease. Counseling  Your health care provider may ask you questions about your:  Alcohol use.  Tobacco use.  Drug use.  Emotional  well-being.  Home and relationship well-being.  Sexual activity.  Eating habits.  History of falls.  Memory and ability to understand (cognition).  Work and work Statistician.  Reproductive health. Screening  You may have the following tests or measurements:  Height, weight, and BMI.  Blood pressure.  Lipid and cholesterol levels. These may be checked every 5 years, or more frequently if you are over 44 years old.  Skin check.  Lung cancer screening. You may have this screening every year starting at age 30 if you have a 30-pack-year history of smoking and currently smoke or have quit within the past 15 years.  Fecal occult blood test (FOBT) of the stool. You may have this test every year starting at age 102.  Flexible sigmoidoscopy or colonoscopy. You may have a sigmoidoscopy every 5 years or a colonoscopy every 10 years starting at age 46.  Hepatitis C blood test.  Hepatitis B blood test.  Sexually transmitted disease (STD) testing.  Diabetes screening. This is done by checking your blood sugar (glucose) after you have not eaten for a while (fasting). You may have this done every 1-3 years.  Bone density scan. This is done to screen for osteoporosis. You may have this done starting at age 43.  Mammogram. This may be done every 1-2 years. Talk to your health care provider about how often you should have regular mammograms. Talk with your health care provider about your test results, treatment options, and if necessary, the need for more tests. Vaccines  Your health care provider may recommend certain vaccines, such as:  Influenza vaccine. This is recommended every year.  Tetanus, diphtheria, and acellular pertussis (  Tdap, Td) vaccine. You may need a Td booster every 10 years.  Zoster vaccine. You may need this after age 13.  Pneumococcal 13-valent conjugate (PCV13) vaccine. One dose is recommended after age 62.  Pneumococcal polysaccharide (PPSV23) vaccine. One  dose is recommended after age 76. Talk to your health care provider about which screenings and vaccines you need and how often you need them. This information is not intended to replace advice given to you by your health care provider. Make sure you discuss any questions you have with your health care provider. Document Released: 08/21/2015 Document Revised: 04/13/2016 Document Reviewed: 05/26/2015 Elsevier Interactive Patient Education  2017 [place] Prevention in the Home Falls can cause injuries. They can happen to people of all ages. There are many things you can do to make your home safe and to help prevent falls. What can I do on the outside of my home?  Regularly fix the edges of walkways and driveways and fix any cracks.  Remove anything that might make you trip as you walk through a door, such as a raised step or threshold.  Trim any bushes or trees on the path to your home.  Use bright outdoor lighting.  Clear any walking paths of anything that might make someone trip, such as rocks or tools.  Regularly check to see if handrails are loose or broken. Make sure that both sides of any steps have handrails.  Any raised decks and porches should have guardrails on the edges.  Have any leaves, snow, or ice cleared regularly.  Use sand or salt on walking paths during winter.  Clean up any spills in your garage right away. This includes oil or grease spills. What can I do in the bathroom?  Use night lights.  Install grab bars by the toilet and in the tub and shower. Do not use towel bars as grab bars.  Use non-skid mats or decals in the tub or shower.  If you need to sit down in the shower, use a plastic, non-slip stool.  Keep the floor dry. Clean up any water that spills on the floor as soon as it happens.  Remove soap buildup in the tub or shower regularly.  Attach bath mats securely with double-sided non-slip rug tape.  Do not have throw rugs and other  things on the floor that can make you trip. What can I do in the bedroom?  Use night lights.  Make sure that you have a light by your bed that is easy to reach.  Do not use any sheets or blankets that are too big for your bed. They should not hang down onto the floor.  Have a firm chair that has side arms. You can use this for support while you get dressed.  Do not have throw rugs and other things on the floor that can make you trip. What can I do in the kitchen?  Clean up any spills right away.  Avoid walking on wet floors.  Keep items that you use a lot in easy-to-reach places.  If you need to reach something above you, use a strong step stool that has a grab bar.  Keep electrical cords out of the way.  Do not use floor polish or wax that makes floors slippery. If you must use wax, use non-skid floor wax.  Do not have throw rugs and other things on the floor that can make you trip. What can I do with my stairs?  Do  not leave any items on the stairs.  Make sure that there are handrails on both sides of the stairs and use them. Fix handrails that are broken or loose. Make sure that handrails are as long as the stairways.  Check any carpeting to make sure that it is firmly attached to the stairs. Fix any carpet that is loose or worn.  Avoid having throw rugs at the top or bottom of the stairs. If you do have throw rugs, attach them to the floor with carpet tape.  Make sure that you have a light switch at the top of the stairs and the bottom of the stairs. If you do not have them, ask someone to add them for you. What else can I do to help prevent falls?  Wear shoes that:  Do not have high heels.  Have rubber bottoms.  Are comfortable and fit you well.  Are closed at the toe. Do not wear sandals.  If you use a stepladder:  Make sure that it is fully opened. Do not climb a closed stepladder.  Make sure that both sides of the stepladder are locked into place.  Ask  someone to hold it for you, if possible.  Clearly mark and make sure that you can see:  Any grab bars or handrails.  First and last steps.  Where the edge of each step is.  Use tools that help you move around (mobility aids) if they are needed. These include:  Canes.  Walkers.  Scooters.  Crutches.  Turn on the lights when you go into a dark area. Replace any light bulbs as soon as they burn out.  Set up your furniture so you have a clear path. Avoid moving your furniture around.  If any of your floors are uneven, fix them.  If there are any pets around you, be aware of where they are.  Review your medicines with your doctor. Some medicines can make you feel dizzy. This can increase your chance of falling. Ask your doctor what other things that you can do to help prevent falls. This information is not intended to replace advice given to you by your health care provider. Make sure you discuss any questions you have with your health care provider. Document Released: 05/21/2009 Document Revised: 12/31/2015 Document Reviewed: 08/29/2014 Elsevier Interactive Patient Education  2017 [employer].

## 2017-04-25 NOTE — Addendum Note (Signed)
Addended by: Rich Reining E on: 04/25/2017 10:23 AM   Modules accepted: Level of Service

## 2017-04-25 NOTE — Progress Notes (Signed)
Subjective:   Haley Cohen is a 83 y.o. female who presents for a Subsequent Medicare Annual Wellness Visit at [facility] AWV-05/2015  Objective:    Today's Vitals   04/25/17 0904  BP: 124/68  Pulse: 60  Temp: 98.2 F (36.8 C)  TempSrc: Oral  SpO2: 96%  Weight: 140 lb (63.5 kg)  Height: 5\' 2"  (1.575 m)   Body mass index is 25.61 kg/m.   Current Medications (verified) Outpatient Encounter Prescriptions as of 04/25/2017  Medication Sig  . cholecalciferol (VITAMIN D) 1000 units tablet Take 1,000 Units by mouth daily.  . DULoxetine (CYMBALTA) 30 MG capsule Take 30 mg by mouth daily.  Marland Kitchen levothyroxine (SYNTHROID, LEVOTHROID) 25 MCG tablet Take 25 mcg by mouth daily before breakfast.  . lisinopril (PRINIVIL,ZESTRIL) 40 MG tablet Take 40 mg by mouth. Take one tablet daily for blood pressure  . Memantine HCl-Donepezil HCl (NAMZARIC) 28-10 MG CP24 Take 1 tablet by mouth daily.  . metoprolol succinate (TOPROL-XL) 50 MG 24 hr tablet Take 50 mg by mouth daily. Take with or immediately following a meal.  . naproxen sodium (ANAPROX) 220 MG tablet Take 220 mg by mouth 2 (two) times daily with a meal.  . traZODone (DESYREL) 50 MG tablet Take 50 mg by mouth at bedtime.  . vitamin B-12 (CYANOCOBALAMIN) 1000 MCG tablet Take 1,000 mcg by mouth daily.  . vitamin C (ASCORBIC ACID) 500 MG tablet Take 500 mg by mouth daily.   No facility-administered encounter medications on file as of 04/25/2017.     Allergies (verified) Patient has no known allergies.   History: Past Medical History:  Diagnosis Date  . Alzheimer disease 10/27/2016  . Depression, recurrent ([place]) 10/27/2016  . HTN (hypertension) 10/27/2016  . Hypertension   . Hypertensive kidney disease with CKD (chronic kidney disease) stage V ([place]) 11/02/2016   11/02/16 Na 136, K 4.7, Bun 30, creat 1.52, TSH 0.86, wbc 6.7, Hgb 11.3, plt 264  . Hypothyroidism 10/27/2016  . Osteoarthritis 10/27/2016  .  Thyroid disease    Past Surgical History:  Procedure Laterality Date  . ABDOMINAL HYSTERECTOMY  1986  . KNEE ARTHROSCOPY  2015/2017   Dr. Gustavus Bryant  . SPINE SURGERY  2009, 2011   Dr.Mark Nicoletta Dress   Family History  Problem Relation Age of Onset  . Cancer Mother    Social History   Occupational History  . Not on file.   Social History Main Topics  . Smoking status: Never Smoker  . Smokeless tobacco: Never Used  . Alcohol use No  . Drug use: No  . Sexual activity: No    Tobacco Counseling Counseling given: Not Answered   Activities of Daily Living In your present state of health, do you have any difficulty performing the following activities: 04/25/2017  Hearing? Y  Vision? N  Difficulty concentrating or making decisions? N  Walking or climbing stairs? N  Dressing or bathing? N  Doing errands, shopping? Y  Preparing Food and eating ? N  Using the Toilet? N  In the past six months, have you accidently leaked urine? N  Do you have problems with loss of bowel control? N  Managing your Medications? Y  Managing your Finances? Y  Housekeeping or managing your Housekeeping? Y    Immunizations and Health Maintenance  There is no immunization history on file for this patient. Health Maintenance Due  Topic Date Due  . TETANUS/TDAP  11/23/1950  . DEXA SCAN  11/24/1998  . PNA vac Low Risk Adult (1 of 2 - PCV13) 11/24/1998  . INFLUENZA VACCINE  03/08/2017    Patient Care Team: Blanchie Serve, MD as PCP - General (Internal Medicine)  Indicate any recent Medical Services you may have received from other than [facility] providers in the past year (date may be approximate).     Assessment:   This is a routine wellness examination for Haley Cohen.   Hearing/Vision screen No exam data present  Dietary issues and exercise activities discussed: Current Exercise Habits: Home exercise routine, Type of exercise: Other - see comments (biking), Time (Minutes): 15, Frequency (Times/Week): 4, Weekly  Exercise (Minutes/Week): 60, Intensity: Mild, Exercise limited by: None identified  Goals    . Maintain LIfestyle          Patient will maintain her lifestyle.      Depression Screen PHQ 2/9 Scores 04/25/2017  PHQ - 2 Score 0    Fall Risk Fall Risk  04/25/2017  Falls in the past year? No    Cognitive Function: MMSE - Mini Mental State Exam 04/25/2017  Orientation to time 4  Orientation to Place 3  Registration 3  Attention/ Calculation 5  Recall 0  Language- name 2 objects 2  Language- repeat 1  Language- follow 3 step command 3  Language- read & follow direction 1  Write a sentence 1  Copy design 1  Total score 24        Screening Tests Health Maintenance  Topic Date Due  . TETANUS/TDAP  11/23/1950  . DEXA SCAN  11/24/1998  . PNA vac Low Risk Adult (1 of 2 - PCV13) 11/24/1998  . INFLUENZA VACCINE  03/08/2017      Plan:    I have personally reviewed and addressed the Medicare Annual Wellness questionnaire and have noted the following in the patient's chart:  A. Medical and social history B. Use of alcohol, tobacco or illicit drugs  C. Current medications and supplements D. Functional ability and status E.  Nutritional status F.  Physical activity G. Advance directives H. List of other physicians I.  Hospitalizations, surgeries, and ER visits in previous 12 months J.  [place] to include hearing, vision, cognitive, depression L. Referrals and appointments - none  In addition, I have reviewed and discussed with patient certain preventive protocols, quality metrics, and best practice recommendations. A written personalized care plan for preventive services as well as general preventive health recommendations were provided to patient.  See attached scanned questionnaire for additional information.   Signed,   Rich Reining, RN Nurse Health Advisor   Quick Notes   Health Maintenance: PNA 13, tdap, flu, and DEXA due     Abnormal Screen:  MMSE 24/30, passed clock drawing     Patient Concerns: None     Nurse Concerns: None

## 2017-04-27 DIAGNOSIS — M810 Age-related osteoporosis without current pathological fracture: Secondary | ICD-10-CM | POA: Diagnosis not present

## 2017-04-27 LAB — HM DEXA SCAN: HM Dexa Scan: -0.9

## 2017-04-28 ENCOUNTER — Non-Acute Institutional Stay: Payer: Medicare Other | Admitting: Internal Medicine

## 2017-04-28 ENCOUNTER — Encounter: Payer: Self-pay | Admitting: Internal Medicine

## 2017-04-28 DIAGNOSIS — F341 Dysthymic disorder: Secondary | ICD-10-CM

## 2017-04-28 DIAGNOSIS — F329 Major depressive disorder, single episode, unspecified: Secondary | ICD-10-CM | POA: Insufficient documentation

## 2017-04-28 DIAGNOSIS — E034 Atrophy of thyroid (acquired): Secondary | ICD-10-CM

## 2017-04-28 DIAGNOSIS — F0280 Dementia in other diseases classified elsewhere without behavioral disturbance: Secondary | ICD-10-CM | POA: Diagnosis not present

## 2017-04-28 DIAGNOSIS — I10 Essential (primary) hypertension: Secondary | ICD-10-CM | POA: Diagnosis not present

## 2017-04-28 DIAGNOSIS — G309 Alzheimer's disease, unspecified: Secondary | ICD-10-CM | POA: Diagnosis not present

## 2017-04-28 DIAGNOSIS — M1990 Unspecified osteoarthritis, unspecified site: Secondary | ICD-10-CM | POA: Diagnosis not present

## 2017-04-28 NOTE — Progress Notes (Signed)
Location:  [facility] Room Number: 073 Place of Service:  [phone]) Provider:  Blanchie Serve MD  Blanchie Serve, MD  Patient Care Team: Blanchie Serve, MD as PCP - General (Internal Medicine)  Extended Emergency Contact Information Primary Emergency Contact: [facility] Address: [address]          [place], [place] [address] [name] of [place] Phone: [phone] Relation: Son  Code Status:  DNR Goals of care: Advanced Directive information Advanced Directives 04/28/2017  Does Patient Have a Medical Advance Directive? Yes  Type of Paramedic of [place];Out of facility DNR (pink MOST or yellow form)  Does patient want to make changes to medical advance directive? No - Patient declined  Copy of [place] in Chart? Yes  Pre-existing out of facility DNR order (yellow form or pink MOST form) Yellow form placed in chart (order not valid for inpatient use);Pink MOST form placed in chart (order not valid for inpatient use)     Chief Complaint  Patient presents with  . Medical Management of Chronic Issues    Routine Visit     HPI:  Pt is a 83 y.o. female seen today for medical management of chronic diseases. She got her hair done today at the parlor. She denies any concern this visit. No concern from nursing.    Past Medical History:  Diagnosis Date  . Alzheimer disease 10/27/2016  . Depression, recurrent ([place]) 10/27/2016  . HTN (hypertension) 10/27/2016  . Hypertension   . Hypertensive kidney disease with CKD (chronic kidney disease) stage V ([place]) 11/02/2016   11/02/16 Na 136, K 4.7, Bun 30, creat 1.52, TSH 0.86, wbc 6.7, Hgb 11.3, plt 264  . Hypothyroidism 10/27/2016  . Osteoarthritis 10/27/2016  . Thyroid disease    Past Surgical History:  Procedure Laterality Date  . ABDOMINAL HYSTERECTOMY  1986  . KNEE ARTHROSCOPY  2015/2017   Dr. Gustavus Bryant  . SPINE SURGERY  2009, 2011   Dr.Mark Nicoletta Dress    No  Known Allergies  Outpatient Encounter Prescriptions as of 04/28/2017  Medication Sig  . cholecalciferol (VITAMIN D) 1000 units tablet Take 1,000 Units by mouth daily.  . DULoxetine (CYMBALTA) 30 MG capsule Take 30 mg by mouth at bedtime.   Marland Kitchen levothyroxine (SYNTHROID, LEVOTHROID) 25 MCG tablet Take 25 mcg by mouth daily before breakfast.  . lisinopril (PRINIVIL,ZESTRIL) 40 MG tablet Take 40 mg by mouth daily.   . Memantine HCl-Donepezil HCl (NAMZARIC) 28-10 MG CP24 Take 1 tablet by mouth at bedtime.   . metoprolol tartrate (LOPRESSOR) 50 MG tablet Take 50 mg by mouth 2 (two) times daily.  . naproxen sodium (ANAPROX) 220 MG tablet Take 220 mg by mouth 2 (two) times daily as needed.   . traZODone (DESYREL) 50 MG tablet Take 50 mg by mouth at bedtime.  . vitamin B-12 (CYANOCOBALAMIN) 1000 MCG tablet Take 1,000 mcg by mouth daily.  . vitamin C (ASCORBIC ACID) 500 MG tablet Take 500 mg by mouth daily.   No facility-administered encounter medications on file as of 04/28/2017.     Review of Systems  Constitutional: Negative for appetite change, chills, fatigue and fever.  HENT: Negative for congestion, facial swelling, mouth sores, postnasal drip, sinus pressure and trouble swallowing.   Eyes: Positive for visual disturbance.       Wears corrective glasses  Respiratory: Negative for cough, choking, chest tightness, shortness of breath and wheezing.   Cardiovascular: Negative for chest pain, palpitations and leg  swelling.  Gastrointestinal: Positive for constipation. Negative for abdominal pain, blood in stool, diarrhea, nausea and vomiting.  Genitourinary: Negative for dysuria and hematuria.  Musculoskeletal: Positive for arthralgias, back pain and gait problem. Negative for joint swelling.  Skin: Negative for rash and wound.  Neurological: Negative for dizziness, seizures, syncope, light-headedness and numbness.  Hematological: Does not bruise/bleed easily.  Psychiatric/Behavioral: Positive for  confusion. Negative for behavioral problems and sleep disturbance.     There is no immunization history on file for this patient. Pertinent  Health Maintenance Due  Topic Date Due  . INFLUENZA VACCINE  05/08/2017 (Originally 03/08/2017)  . PNA vac Low Risk Adult (1 of 2 - PCV13) 06/08/2017 (Originally 11/24/1998)  . DEXA SCAN  Completed   Fall Risk  04/25/2017  Falls in the past year? No   Functional Status Survey:    Vitals:   04/28/17 1437  BP: 140/80  Pulse: 76  Resp: 20  Temp: 98.7 F (37.1 C)  TempSrc: Oral  Weight: 140 lb (63.5 kg)  Height: 5\' 2"  (1.575 m)   Body mass index is 25.61 kg/m.   Wt Readings from Last 3 Encounters:  04/28/17 140 lb (63.5 kg)  04/25/17 140 lb (63.5 kg)  02/02/17 139 lb 9.6 oz (63.3 kg)   Physical Exam  Constitutional: She appears well-developed and well-nourished. No distress.  HENT:  Head: Normocephalic and atraumatic.  Mouth/Throat: Oropharynx is clear and moist. No oropharyngeal exudate.  Eyes: Pupils are equal, round, and reactive to light. Conjunctivae and EOM are normal.  Neck: Normal range of motion. Neck supple.  Cardiovascular: Normal rate and regular rhythm.   Pulmonary/Chest: Effort normal and breath sounds normal. She has no wheezes. She has no rales. She exhibits no tenderness.  Abdominal: Soft. Bowel sounds are normal. She exhibits no distension. There is no tenderness. There is no guarding.  Musculoskeletal: She exhibits edema.  Can move all 4 extremities, uses rolling walker, unsteady gait  Lymphadenopathy:    She has no cervical adenopathy.  Neurological: She is alert.  Oriented to person and place but not to time  Skin: Skin is warm and dry. No rash noted. She is not diaphoretic.  Psychiatric: She has a normal mood and affect. Her behavior is normal.    Labs reviewed:  Recent Labs  11/01/16 02/02/17  NA 138 140  K 4.7 4.2  BUN 30* 19  CREATININE 1.5* 1.2*    Recent Labs  11/01/16  AST 11*  ALT 6*    ALKPHOS 66    Recent Labs  11/01/16  WBC 6.7  HGB 11.3*  HCT 33*  PLT 264   Lab Results  Component Value Date   TSH 0.86 11/01/2016   Lab Results  Component Value Date   HGBA1C 5.3 11/01/2016   No results found for: CHOL, HDL, LDLCALC, LDLDIRECT, TRIG, CHOLHDL  Significant Diagnostic Results in last 30 days:  No results found.  Assessment/Plan   Essential hypertension Elevated BP on review. Currently on metoprolol tartrate 50 mg bid, lisinopril 40 mg daily. Change her metoprolol tartrate to 75 mg bid and monitor  Hypothyroidism Lab Results  Component Value Date   TSH 0.86 11/01/2016   Check tsh, continue levothyroxine 25 mcg daily.   ckd stage 3 On prn naproxen, discontinue this. Reviewed bmp.  Alzheimer's dementia Supportive care. Continue namzaric  OA Denies pain at present. She is on cymbalta and prn naproxen. D/c prn naproxen order. Place her on tylenol 650 mg q8h prn pain  Chronic  depression Mood appears stable. Currently on trazodone 50 mg daily qhs. Decrease this to 25 mg daily in attempt for GDR. Continue cymbalta current dosing.       Family/ staff Communication:   Labs/tests ordered:     Blanchie Serve, MD Internal Medicine [place], [place] [address] Cell Phone (Monday-Friday 8 am - 5 pm): [phone] On Call: [phone] and follow prompts after 5 pm and on weekends Office Phone: [phone] Office Fax: [phone]

## 2017-05-02 DIAGNOSIS — E034 Atrophy of thyroid (acquired): Secondary | ICD-10-CM | POA: Diagnosis not present

## 2017-05-02 LAB — TSH: TSH: 1.89 (ref 0.41–5.90)

## 2017-06-26 ENCOUNTER — Non-Acute Institutional Stay: Payer: Medicare Other | Admitting: Nurse Practitioner

## 2017-06-26 ENCOUNTER — Encounter: Payer: Self-pay | Admitting: Nurse Practitioner

## 2017-06-26 DIAGNOSIS — I120 Hypertensive chronic kidney disease with stage 5 chronic kidney disease or end stage renal disease: Secondary | ICD-10-CM

## 2017-06-26 DIAGNOSIS — I10 Essential (primary) hypertension: Secondary | ICD-10-CM | POA: Diagnosis not present

## 2017-06-26 DIAGNOSIS — N185 Chronic kidney disease, stage 5: Secondary | ICD-10-CM

## 2017-06-26 NOTE — Assessment & Plan Note (Signed)
Mildly elevated Sbp in 150s, asymptomatic, continue Metoprolol 75mg  bid, Lisinopril 40mg  qd. Observe

## 2017-06-26 NOTE — Assessment & Plan Note (Signed)
Last Bun 19, creat 1.17 02/02/17, update CMP

## 2017-06-26 NOTE — Progress Notes (Signed)
Location:  [facility] Room Number: 299 Place of Service:  ALF [phone]) Provider:  Rosalynd Mcwright, Manxie  NP  Blanchie Serve, MD  Patient Care Team: Blanchie Serve, MD as PCP - General (Internal Medicine)  Extended Emergency Contact Information Primary Emergency Contact: [facility] Address: [address]          [place], [place] [address] [name] of [place] Phone: [phone] Relation: Son  Code Status:  DNR Goals of care: Advanced Directive information Advanced Directives 06/26/2017  Does Patient Have a Medical Advance Directive? Yes  Type of Advance Directive Out of facility DNR (pink MOST or yellow form);Healthcare Power of Attorney  Does patient want to make changes to medical advance directive? No - Patient declined  Copy of [place] in Chart? Yes  Pre-existing out of facility DNR order (yellow form or pink MOST form) Yellow form placed in chart (order not valid for inpatient use);Pink MOST form placed in chart (order not valid for inpatient use)     Chief Complaint  Patient presents with  . Acute Visit    Elevated BP    HPI:  Pt is a 83 y.o. female seen today for an acute visit for elevated blood pressures 06/25/17, Bp 200/110, the stated she felt fine, repeated Bp 180/100, then Bp 170/100, after pm dose of Metoprolol 75mg , then Bp was down to 140/80 after Clonidine 0.66mx 1. Her blood pressure is 150/80 today upon my visit. She denied headache, dizziness, vision changes, chest pain/pressure/palpitation, nausea, vomiting, or focal weakness associate with her elevated blood pressure event.    Past Medical History:  Diagnosis Date  . Alzheimer disease 10/27/2016  . Depression, recurrent ([place]) 10/27/2016  . HTN (hypertension) 10/27/2016  . Hypertension   . Hypertensive kidney disease with CKD (chronic kidney disease) stage V ([place]) 11/02/2016   11/02/16 Na 136, K 4.7, Bun 30, creat 1.52, TSH 0.86, wbc 6.7, Hgb 11.3, plt 264  .  Hypothyroidism 10/27/2016  . Osteoarthritis 10/27/2016  . Thyroid disease    Past Surgical History:  Procedure Laterality Date  . ABDOMINAL HYSTERECTOMY  1986  . KNEE ARTHROSCOPY  2015/2017   Dr. Gustavus Bryant  . SPINE SURGERY  2009, 2011   Dr.Mark Nicoletta Dress    No Known Allergies  Outpatient Encounter Medications as of 06/26/2017  Medication Sig  . acetaminophen (TYLENOL) 325 MG tablet Take 650 mg every 6 (six) hours as needed by mouth for mild pain.  . cholecalciferol (VITAMIN D) 1000 units tablet Take 1,000 Units by mouth daily.  . DULoxetine (CYMBALTA) 30 MG capsule Take 30 mg by mouth at bedtime.   Marland Kitchen levothyroxine (SYNTHROID, LEVOTHROID) 25 MCG tablet Take 25 mcg by mouth daily before breakfast.  . lisinopril (PRINIVIL,ZESTRIL) 40 MG tablet Take 40 mg by mouth daily.   . Memantine HCl-Donepezil HCl (NAMZARIC) 28-10 MG CP24 Take 1 tablet by mouth at bedtime.   . metoprolol tartrate (LOPRESSOR) 50 MG tablet Take 50 mg by mouth 2 (two) times daily.  . traZODone (DESYREL) 50 MG tablet Take 50 mg by mouth at bedtime.  . vitamin B-12 (CYANOCOBALAMIN) 1000 MCG tablet Take 1,000 mcg by mouth daily.  . vitamin C (ASCORBIC ACID) 500 MG tablet Take 500 mg by mouth daily.  . [DISCONTINUED] naproxen sodium (ANAPROX) 220 MG tablet Take 220 mg by mouth 2 (two) times daily as needed.    No facility-administered encounter medications on file as of 06/26/2017.     Review of Systems  Constitutional: Negative for  activity change, appetite change, chills, diaphoresis, fatigue and fever.  HENT: Positive for hearing loss. Negative for trouble swallowing and voice change.   Eyes: Negative for visual disturbance.  Respiratory: Negative for cough, chest tightness and wheezing.   Cardiovascular: Negative for palpitations and leg swelling.  Gastrointestinal: Negative for nausea and vomiting.  Musculoskeletal: Positive for gait problem.  Neurological: Negative for dizziness, tremors, speech difficulty, weakness,  numbness and headaches.  Psychiatric/Behavioral: Positive for confusion. Negative for agitation, behavioral problems, hallucinations and sleep disturbance. The patient is not nervous/anxious.     Immunization History  Administered Date(s) Administered  . Influenza-Unspecified 05/29/2017   Pertinent  Health Maintenance Due  Topic Date Due  . PNA vac Low Risk Adult (1 of 2 - PCV13) 11/24/1998  . INFLUENZA VACCINE  Completed  . DEXA SCAN  Completed   Fall Risk  04/25/2017  Falls in the past year? No   Functional Status Survey:    Vitals:   06/26/17 1046  BP: (!) 150/80  Pulse: 60  Resp: 17  Temp: 99.3 F (37.4 C)  Weight: 140 lb (63.5 kg)  Height: 5\' 2"  (1.575 m)   Body mass index is 25.61 kg/m. Physical Exam  Constitutional: She appears well-developed and well-nourished.  HENT:  Head: Normocephalic and atraumatic.  Eyes: Conjunctivae and EOM are normal. Pupils are equal, round, and reactive to light.  Neck: Normal range of motion. Neck supple. No JVD present.  Cardiovascular: Normal rate and regular rhythm.  No murmur heard. Pulmonary/Chest: Effort normal and breath sounds normal. She has no wheezes. She has no rales.  Neurological: She is alert. She exhibits normal muscle tone. Coordination normal.  Oriented to person and place.   Skin: Skin is dry.  Psychiatric: She has a normal mood and affect. Her behavior is normal. Judgment and thought content normal.    Labs reviewed: Recent Labs    11/01/16 02/02/17  NA 138 140  K 4.7 4.2  BUN 30* 19  CREATININE 1.5* 1.2*   Recent Labs    11/01/16  AST 11*  ALT 6*  ALKPHOS 66   Recent Labs    11/01/16  WBC 6.7  HGB 11.3*  HCT 33*  PLT 264   Lab Results  Component Value Date   TSH 1.89 05/02/2017   Lab Results  Component Value Date   HGBA1C 5.3 11/01/2016   No results found for: CHOL, HDL, LDLCALC, LDLDIRECT, TRIG, CHOLHDL  Significant Diagnostic Results in last 30 days:  No results  found.  Assessment/Plan HTN (hypertension) Mildly elevated Sbp in 150s, asymptomatic, continue Metoprolol 75mg  bid, Lisinopril 40mg  qd. Observe  Hypertensive kidney disease with CKD (chronic kidney disease) stage V (HCC) Last Bun 19, creat 1.17 02/02/17, update CMP      Family/ staff Communication: plan of care reviewed with the patient and charge nurse  Labs/tests ordered:  CMP  Time spend 25 minutes.

## 2017-06-27 DIAGNOSIS — I10 Essential (primary) hypertension: Secondary | ICD-10-CM | POA: Diagnosis not present

## 2017-07-21 ENCOUNTER — Encounter: Payer: Self-pay | Admitting: Nurse Practitioner

## 2017-07-21 ENCOUNTER — Non-Acute Institutional Stay: Payer: Medicare Other | Admitting: Nurse Practitioner

## 2017-07-21 DIAGNOSIS — I120 Hypertensive chronic kidney disease with stage 5 chronic kidney disease or end stage renal disease: Secondary | ICD-10-CM

## 2017-07-21 DIAGNOSIS — E039 Hypothyroidism, unspecified: Secondary | ICD-10-CM

## 2017-07-21 DIAGNOSIS — I10 Essential (primary) hypertension: Secondary | ICD-10-CM | POA: Diagnosis not present

## 2017-07-21 DIAGNOSIS — F0280 Dementia in other diseases classified elsewhere without behavioral disturbance: Secondary | ICD-10-CM

## 2017-07-21 DIAGNOSIS — G301 Alzheimer's disease with late onset: Secondary | ICD-10-CM | POA: Diagnosis not present

## 2017-07-21 DIAGNOSIS — F329 Major depressive disorder, single episode, unspecified: Secondary | ICD-10-CM

## 2017-07-21 DIAGNOSIS — N185 Chronic kidney disease, stage 5: Secondary | ICD-10-CM

## 2017-07-21 DIAGNOSIS — F341 Dysthymic disorder: Secondary | ICD-10-CM

## 2017-07-21 NOTE — Progress Notes (Signed)
Location:   [facility] Room Number: 612 Place of Service: [facility] Provider:  Lennie Odor Chanley Mcenery NP  Blanchie Serve, MD  Patient Care Team: Blanchie Serve, MD as PCP - General (Internal Medicine)  Extended Emergency Contact Information Primary Emergency Contact: [facility] Address: [address]          [place], [place] [address] [name] of [place] Phone: [phone] Relation: Son  Code Status:  DNR Goals of care: Advanced Directive information Advanced Directives 07/21/2017  Does Patient Have a Medical Advance Directive? No  Type of Advance Directive Out of facility DNR (pink MOST or yellow form);[place];Living will  Does patient want to make changes to medical advance directive? No - Patient declined  Copy of [place] in Chart? Yes  Pre-existing out of facility DNR order (yellow form or pink MOST form) Yellow form placed in chart (order not valid for inpatient use)     Chief Complaint  Patient presents with  . Medical Management of Chronic Issues    HPI:  Pt is a 83 y.o. female seen today for medical management of chronic diseases.     The patient has history of HTN, mildly elevated Sbp 150-160, takes Metoprolol 50mg  bid, Lisinopril 40mg  daily. Her mood is stable while on Trazodone 50mg  qd, Cymbalta 30mg  daily. Her Memory is preserved on Namzaric 28/10 po daily, resides in [facility] for care needs, ambulates with walker, transfers self. Hypothyroidism, taking Levothyroxine 71mcg, last TSH 1.98 05/02/17   Past Medical History:  Diagnosis Date  . Alzheimer disease 10/27/2016  . Depression, recurrent ([place]) 10/27/2016  . HTN (hypertension) 10/27/2016  . Hypertension   . Hypertensive kidney disease with CKD (chronic kidney disease) stage V ([place]) 11/02/2016   11/02/16 Na 136, K 4.7, Bun 30, creat 1.52, TSH 0.86, wbc 6.7, Hgb 11.3, plt 264  . Hypothyroidism 10/27/2016  . Osteoarthritis 10/27/2016  . Thyroid disease    Past  Surgical History:  Procedure Laterality Date  . ABDOMINAL HYSTERECTOMY  1986  . KNEE ARTHROSCOPY  2015/2017   Dr. Gustavus Bryant  . SPINE SURGERY  2009, 2011   Dr.Mark Nicoletta Dress    No Known Allergies  Allergies as of 07/21/2017   No Known Allergies     Medication List        Accurate as of 07/21/17 11:59 PM. Always use your most recent med list.          acetaminophen 325 MG tablet Commonly known as:  TYLENOL Take 650 mg every 6 (six) hours as needed by mouth for mild pain.   cholecalciferol 1000 units tablet Commonly known as:  VITAMIN D Take 1,000 Units by mouth daily.   DULoxetine 30 MG capsule Commonly known as:  CYMBALTA Take 30 mg by mouth at bedtime.   levothyroxine 25 MCG tablet Commonly known as:  SYNTHROID, LEVOTHROID Take 25 mcg by mouth daily before breakfast.   lisinopril 40 MG tablet Commonly known as:  PRINIVIL,ZESTRIL Take 40 mg by mouth daily.   metoprolol tartrate 50 MG tablet Commonly known as:  LOPRESSOR Take 50 mg by mouth 2 (two) times daily.   NAMZARIC 28-10 MG Cp24 Generic drug:  Memantine HCl-Donepezil HCl Take 1 tablet by mouth at bedtime.   traZODone 50 MG tablet Commonly known as:  DESYREL Take 50 mg by mouth at bedtime.   vitamin B-12 1000 MCG tablet Commonly known as:  CYANOCOBALAMIN Take 1,000 mcg by mouth daily.   vitamin C 500 MG tablet Commonly  known as:  ASCORBIC ACID Take 500 mg by mouth daily.       Review of Systems  Constitutional: Negative for activity change, appetite change, chills, diaphoresis, fatigue and fever.  HENT: Positive for hearing loss. Negative for congestion, trouble swallowing and voice change.   Eyes: Positive for visual disturbance.  Respiratory: Negative for cough, choking, chest tightness, shortness of breath and wheezing.   Cardiovascular: Negative for chest pain, palpitations and leg swelling.  Gastrointestinal: Negative for abdominal distention, abdominal pain, constipation, diarrhea, nausea and  vomiting.  Endocrine: Negative for cold intolerance.  Genitourinary: Negative for difficulty urinating, dysuria and urgency.  Musculoskeletal: Positive for back pain and gait problem. Negative for arthralgias, joint swelling and myalgias.  Skin: Negative for color change, pallor, rash and wound.  Neurological: Negative for tremors, speech difficulty and headaches.  Psychiatric/Behavioral: Positive for confusion. Negative for agitation, behavioral problems, hallucinations and sleep disturbance. The patient is not nervous/anxious.     Immunization History  Administered Date(s) Administered  . Influenza-Unspecified 05/29/2017  . Pneumococcal Conjugate-13 06/21/2017  . Tdap 06/21/2017   Pertinent  Health Maintenance Due  Topic Date Due  . PNA vac Low Risk Adult (1 of 2 - PCV13) 11/24/1998  . INFLUENZA VACCINE  Completed  . DEXA SCAN  Completed   Fall Risk  04/25/2017  Falls in the past year? No   Functional Status Survey:    Vitals:   07/21/17 1134  BP: 140/80  Pulse: 60  Resp: 18  Temp: 98.1 F (36.7 C)  Weight: 142 lb (64.4 kg)  Height: 5\' 2"  (1.575 m)   Body mass index is 25.97 kg/m. Physical Exam  Constitutional: She appears well-developed and well-nourished.  HENT:  Head: Normocephalic and atraumatic.  Eyes: Conjunctivae and EOM are normal. Pupils are equal, round, and reactive to light.  Neck: Normal range of motion. Neck supple. No JVD present.  Cardiovascular: Normal rate, regular rhythm and normal heart sounds.  No murmur heard. Pulmonary/Chest: Effort normal and breath sounds normal. She has no wheezes. She has no rales.  Abdominal: Soft. Bowel sounds are normal. She exhibits no distension. There is no tenderness. There is no rebound.  Musculoskeletal: Normal range of motion. She exhibits tenderness. She exhibits no edema.  Chronic back, ambulates with walker, transfer self.   Neurological: She is alert. She exhibits normal muscle tone. Coordination normal.    Oriented to person and place.   Skin: Skin is warm and dry. No rash noted. No erythema. No pallor.  Psychiatric: She has a normal mood and affect. Her behavior is normal. Judgment and thought content normal.    Labs reviewed: Recent Labs    11/01/16 02/02/17  NA 138 140  K 4.7 4.2  BUN 30* 19  CREATININE 1.5* 1.2*   Recent Labs    11/01/16  AST 11*  ALT 6*  ALKPHOS 66   Recent Labs    11/01/16  WBC 6.7  HGB 11.3*  HCT 33*  PLT 264   Lab Results  Component Value Date   TSH 1.89 05/02/2017   Lab Results  Component Value Date   HGBA1C 5.3 11/01/2016   No results found for: CHOL, HDL, LDLCALC, LDLDIRECT, TRIG, CHOLHDL  Significant Diagnostic Results in last 30 days:  No results found.  Assessment/Plan  HTN (hypertension) mildly elevated Sbp 150-160, continue  Metoprolol 50mg  bid, Lisinopril 40mg  daily.   Hypothyroidism Stable, continue  Levothyroxine 32mcg, last TSH 1.98 05/02/17   Alzheimer disease Her Memory is preserved, continue Namzaric  28/10 po daily, resides in [facility] for care needs, ambulates with walker, transfers self  Major depression, chronic Her mood is stable, continueTrazodone 50mg  qd, Cymbalta 30mg  daily.   Hypertensive kidney disease with CKD (chronic kidney disease) stage V ([place]) Her baseline creat is 1-1.5, last creat 06/27/17 Na 138, K 4.5, Bun 17, creat 1.10, GFR 46    Family/ staff Communication: plan of care reviewed with the patient and charge nurse, Prevnar 13, Pnuemovax, Tdap prescription given today  Labs/tests ordered:  None  Time spend 25 minutes.

## 2017-07-21 NOTE — Progress Notes (Signed)
Location:  [name] Room Number: 147 Place of Service:  ALF [phone]) Provider:  Mast, Manxie  NP  Blanchie Serve, MD  Patient Care Team: Blanchie Serve, MD as PCP - General (Internal Medicine)  Extended Emergency Contact Information Primary Emergency Contact: [facility] Address: [address]          [place], [place] [address] [name] Phone: [phone] Relation: Son  Code Status:  DNR Goals of care: Advanced Directive information Advanced Directives 07/21/2017  Does Patient Have a Medical Advance Directive? No  Type of Advance Directive Out of facility DNR (pink MOST or yellow form)  Does patient want to make changes to medical advance directive? No - Patient declined  Copy of [place] in Chart? -  Pre-existing out of facility DNR order (yellow form or pink MOST form) -     Chief Complaint  Patient presents with  . Medical Management of Chronic Issues    HPI:  Pt is a 83 y.o. female seen today for medical management of chronic diseases.     Past Medical History:  Diagnosis Date  . Alzheimer disease 10/27/2016  . Depression, recurrent ([place]) 10/27/2016  . HTN (hypertension) 10/27/2016  . Hypertension   . Hypertensive kidney disease with CKD (chronic kidney disease) stage V ([place]) 11/02/2016   11/02/16 Na 136, K 4.7, Bun 30, creat 1.52, TSH 0.86, wbc 6.7, Hgb 11.3, plt 264  . Hypothyroidism 10/27/2016  . Osteoarthritis 10/27/2016  . Thyroid disease    Past Surgical History:  Procedure Laterality Date  . ABDOMINAL HYSTERECTOMY  1986  . KNEE ARTHROSCOPY  2015/2017   Dr. Gustavus Bryant  . SPINE SURGERY  2009, 2011   Dr.Mark Nicoletta Dress    No Known Allergies  Outpatient Encounter Medications as of 07/21/2017  Medication Sig  . acetaminophen (TYLENOL) 325 MG tablet Take 650 mg every 6 (six) hours as needed by mouth for mild pain.  . cholecalciferol (VITAMIN D) 1000 units tablet Take 1,000 Units by mouth daily.  .  DULoxetine (CYMBALTA) 30 MG capsule Take 30 mg by mouth at bedtime.   Marland Kitchen levothyroxine (SYNTHROID, LEVOTHROID) 25 MCG tablet Take 25 mcg by mouth daily before breakfast.  . lisinopril (PRINIVIL,ZESTRIL) 40 MG tablet Take 40 mg by mouth daily.   . Memantine HCl-Donepezil HCl (NAMZARIC) 28-10 MG CP24 Take 1 tablet by mouth at bedtime.   . metoprolol tartrate (LOPRESSOR) 50 MG tablet Take 50 mg by mouth 2 (two) times daily.  . traZODone (DESYREL) 50 MG tablet Take 50 mg by mouth at bedtime.  . vitamin B-12 (CYANOCOBALAMIN) 1000 MCG tablet Take 1,000 mcg by mouth daily.  . vitamin C (ASCORBIC ACID) 500 MG tablet Take 500 mg by mouth daily.   No facility-administered encounter medications on file as of 07/21/2017.     Review of Systems  Immunization History  Administered Date(s) Administered  . Influenza-Unspecified 05/29/2017   Pertinent  Health Maintenance Due  Topic Date Due  . PNA vac Low Risk Adult (1 of 2 - PCV13) 11/24/1998  . INFLUENZA VACCINE  Completed  . DEXA SCAN  Completed   Fall Risk  04/25/2017  Falls in the past year? No   Functional Status Survey:    Vitals:   07/21/17 1134  BP: 140/80  Pulse: 60  Resp: 18  Temp: 98.1 F (36.7 C)  Weight: 142 lb (64.4 kg)  Height: 5\' 2"  (1.575 m)   Body mass index is 25.97 kg/m. Physical Exam  Labs reviewed: Recent Labs    11/01/16 02/02/17  NA 138 140  K 4.7 4.2  BUN 30* 19  CREATININE 1.5* 1.2*   Recent Labs    11/01/16  AST 11*  ALT 6*  ALKPHOS 66   Recent Labs    11/01/16  WBC 6.7  HGB 11.3*  HCT 33*  PLT 264   Lab Results  Component Value Date   TSH 1.89 05/02/2017   Lab Results  Component Value Date   HGBA1C 5.3 11/01/2016   No results found for: CHOL, HDL, LDLCALC, LDLDIRECT, TRIG, CHOLHDL  Significant Diagnostic Results in last 30 days:  No results found.  Assessment/Plan 1. Essential hypertension   2. Hypothyroidism, unspecified type   3. Late onset Alzheimer's disease without  behavioral disturbance   4. Major depression, chronic   5. Hypertensive kidney disease with CKD (chronic kidney disease) stage V ([place])      Family/ staff Communication:   Labs/tests ordered:

## 2017-07-21 NOTE — Assessment & Plan Note (Addendum)
Her mood is stable, continueTrazodone 50mg  qd, Cymbalta 30mg  daily.

## 2017-07-21 NOTE — Assessment & Plan Note (Signed)
mildly elevated Sbp 150-160, continue  Metoprolol 50mg  bid, Lisinopril 40mg  daily.

## 2017-07-21 NOTE — Assessment & Plan Note (Signed)
Stable, continue  Levothyroxine 20mcg, last TSH 1.98 05/02/17

## 2017-07-21 NOTE — Assessment & Plan Note (Signed)
Her Memory is preserved, continue Namzaric 28/10 po daily, resides in [facility] for care needs, ambulates with walker, transfers self

## 2017-07-21 NOTE — Assessment & Plan Note (Addendum)
Her baseline creat is 1-1.5, last creat 06/27/17 Na 138, K 4.5, Bun 17, creat 1.10, GFR 46

## 2017-10-05 ENCOUNTER — Encounter: Payer: Self-pay | Admitting: Nurse Practitioner

## 2017-10-05 ENCOUNTER — Non-Acute Institutional Stay: Payer: Medicare Other | Admitting: Nurse Practitioner

## 2017-10-05 DIAGNOSIS — F339 Major depressive disorder, recurrent, unspecified: Secondary | ICD-10-CM | POA: Diagnosis not present

## 2017-10-05 DIAGNOSIS — F0280 Dementia in other diseases classified elsewhere without behavioral disturbance: Secondary | ICD-10-CM

## 2017-10-05 DIAGNOSIS — G301 Alzheimer's disease with late onset: Secondary | ICD-10-CM

## 2017-10-05 DIAGNOSIS — I10 Essential (primary) hypertension: Secondary | ICD-10-CM | POA: Diagnosis not present

## 2017-10-05 DIAGNOSIS — E039 Hypothyroidism, unspecified: Secondary | ICD-10-CM | POA: Diagnosis not present

## 2017-10-05 NOTE — Assessment & Plan Note (Signed)
Last TSH 1.89 05/02/17, continue Levothyroxine 68mcg daily, update TSH

## 2017-10-05 NOTE — Assessment & Plan Note (Addendum)
Complaining memory issues, refused MMSE 08/2017, will try it again, will update CBC CMP TSH, may consider Neurology instead of Psychiatrist consultation per patient's request. Continue Memantine and Donepezil.

## 2017-10-05 NOTE — Assessment & Plan Note (Signed)
Elevated Sbp, the patient is asymptomatic, continue Metoprolol 50mg  bid, Lisinopril 40mg  daily.

## 2017-10-05 NOTE — Progress Notes (Signed)
Location:  [place] Room Number: 520 Place of Service:  ALF [phone]) Provider:  Senia Even, Manxie  NP  Blanchie Serve, MD  Patient Care Team: Blanchie Serve, MD as PCP - General (Internal Medicine) Webster Patrone X, NP as Nurse Practitioner (Internal Medicine)  Extended Emergency Contact Information Primary Emergency Contact: [facility] Address: [address]          [place], [place] [address] [name] of [place] Phone: [phone] Relation: Son  Code Status:  DNR Goals of care: Advanced Directive information Advanced Directives 10/05/2017  Does Patient Have a Medical Advance Directive? Yes  Type of Paramedic of [place];Out of facility DNR (pink MOST or yellow form)  Does patient want to make changes to medical advance directive? No - Patient declined  Copy of [place] in Chart? Yes  Pre-existing out of facility DNR order (yellow form or pink MOST form) Yellow form placed in chart (order not valid for inpatient use)     Chief Complaint  Patient presents with  . Acute Visit    Memory loss, psychiatric consult.    HPI:  Pt is a 84 y.o. female seen today for an acute visit for "psychiatric concerns". The patient stated she has concerns about her memory, she refused MMSE last month 08/2017 x3. Hx of dementia, on Memantine and Donepezil for memory, no noted new focal neurological symptoms. She denied hallucination, delusions, paranoia thoughts. She stated she sleeps and eats well, denied crying episodes or thoughts of death, she is taking Trazodone 50mg  qhs, Cymbalta 30mg  daily.  She has history of HTN, SBP has been elevated while on Lisinopril 40mg  qd, Metoprolol 50mg  bid. She denied headache, dizziness, change of vision, chest pain/pressure, palpitation, or SOB. She denied nausea, vomiting, or indigestion. Hypothyroidism, on Levothyroxine 6mcg po daily, last TSH wnl 04/2017.   Past Medical History:  Diagnosis Date    . Alzheimer disease 10/27/2016  . Depression, recurrent ([place]) 10/27/2016  . HTN (hypertension) 10/27/2016  . Hypertension   . Hypertensive kidney disease with CKD (chronic kidney disease) stage V ([place]) 11/02/2016   11/02/16 Na 136, K 4.7, Bun 30, creat 1.52, TSH 0.86, wbc 6.7, Hgb 11.3, plt 264  . Hypothyroidism 10/27/2016  . Osteoarthritis 10/27/2016  . Thyroid disease    Past Surgical History:  Procedure Laterality Date  . ABDOMINAL HYSTERECTOMY  1986  . KNEE ARTHROSCOPY  2015/2017   Dr. Gustavus Bryant  . SPINE SURGERY  2009, 2011   Dr.Mark Nicoletta Dress    No Known Allergies  Outpatient Encounter Medications as of 10/05/2017  Medication Sig  . acetaminophen (TYLENOL) 325 MG tablet Take 650 mg every 6 (six) hours as needed by mouth for mild pain.  . cholecalciferol (VITAMIN D) 1000 units tablet Take 1,000 Units by mouth daily.  . DULoxetine (CYMBALTA) 30 MG capsule Take 30 mg by mouth at bedtime.   Marland Kitchen levothyroxine (SYNTHROID, LEVOTHROID) 25 MCG tablet Take 25 mcg by mouth daily before breakfast.  . lisinopril (PRINIVIL,ZESTRIL) 40 MG tablet Take 40 mg by mouth daily.   . Memantine HCl-Donepezil HCl (NAMZARIC) 28-10 MG CP24 Take 1 tablet by mouth at bedtime.   . metoprolol tartrate (LOPRESSOR) 50 MG tablet Take 50 mg by mouth 2 (two) times daily.  . traZODone (DESYREL) 50 MG tablet Take 50 mg by mouth at bedtime.  . vitamin B-12 (CYANOCOBALAMIN) 1000 MCG tablet Take 1,000 mcg by mouth daily.  . vitamin C (ASCORBIC ACID) 500 MG tablet Take 500 mg  by mouth daily.   No facility-administered encounter medications on file as of 10/05/2017.    ROS was provided with assistance of staff Review of Systems  Constitutional: Negative for activity change, appetite change, chills, diaphoresis, fatigue and fever.  HENT: Positive for hearing loss. Negative for congestion, trouble swallowing and voice change.   Eyes: Negative for visual disturbance.  Respiratory: Negative for cough, choking, chest tightness,  shortness of breath and wheezing.   Cardiovascular: Negative for chest pain, palpitations and leg swelling.  Gastrointestinal: Negative for abdominal pain, constipation and diarrhea.  Genitourinary: Negative for difficulty urinating, dysuria and urgency.  Musculoskeletal: Positive for gait problem.  Skin: Negative for pallor.  Neurological: Negative for dizziness, speech difficulty, weakness and headaches.       Dementia  Psychiatric/Behavioral: Positive for confusion. Negative for agitation, behavioral problems, hallucinations and sleep disturbance.    Immunization History  Administered Date(s) Administered  . Influenza-Unspecified 05/29/2017  . Pneumococcal Conjugate-13 06/21/2017  . Tdap 06/21/2017   Pertinent  Health Maintenance Due  Topic Date Due  . PNA vac Low Risk Adult (2 of 2 - PPSV23) 06/21/2018  . INFLUENZA VACCINE  Completed  . DEXA SCAN  Completed   Fall Risk  04/25/2017  Falls in the past year? No   Functional Status Survey:    Vitals:   10/05/17 1043  BP: (!) 190/90  Pulse: 76  Resp: 20  Temp: 97.7 F (36.5 C)  Weight: 144 lb (65.3 kg)  Height: 5\' 2"  (1.575 m)   Body mass index is 26.34 kg/m. Physical Exam  Constitutional: She appears well-developed and well-nourished.  HENT:  Head: Normocephalic and atraumatic.  Eyes: Conjunctivae and EOM are normal. Pupils are equal, round, and reactive to light.  Neck: Normal range of motion. Neck supple. No JVD present. No thyromegaly present.  Cardiovascular: Normal rate and regular rhythm.  No murmur heard. Pulmonary/Chest: Effort normal and breath sounds normal. She has no wheezes. She has no rales.  Abdominal: Soft. Bowel sounds are normal. She exhibits no distension. There is no tenderness.  Musculoskeletal: Normal range of motion. She exhibits no edema or tenderness.  Ambulates with walker, self transfer and toileting.   Neurological: She is alert. No cranial nerve deficit. She exhibits normal muscle tone.  Coordination normal.  Oriented to self and her room on unit.   Skin: Skin is warm and dry. No pallor.  Psychiatric: She has a normal mood and affect. Her behavior is normal.    Labs reviewed: Recent Labs    11/01/16 02/02/17  NA 138 140  K 4.7 4.2  BUN 30* 19  CREATININE 1.5* 1.2*   Recent Labs    11/01/16  AST 11*  ALT 6*  ALKPHOS 66   Recent Labs    11/01/16  WBC 6.7  HGB 11.3*  HCT 33*  PLT 264   Lab Results  Component Value Date   TSH 1.89 05/02/2017   Lab Results  Component Value Date   HGBA1C 5.3 11/01/2016   No results found for: CHOL, HDL, LDLCALC, LDLDIRECT, TRIG, CHOLHDL  Significant Diagnostic Results in last 30 days:  No results found.  Assessment/Plan Alzheimer disease Complaining memory issues, refused MMSE 08/2017, will try it again, will update CBC CMP TSH, may consider Neurology instead of Psychiatrist consultation per patient's request. Continue Memantine and Donepezil.   HTN (hypertension) Elevated Sbp, the patient is asymptomatic, continue Metoprolol 50mg  bid, Lisinopril 40mg  daily.   Hypothyroidism Last TSH 1.89 05/02/17, continue Levothyroxine 88mcg daily, update TSH  Depression, recurrent ([place]) Her mood is stable, sleeps and eats well, continue Cymbalta 30mg  and Trazodone 50mg  qhs.      Family/ staff Communication: plan of care reviewed with the patient and charge nurse  Labs/tests ordered:  CBC CMP TSH  Time spend 25 minutes.

## 2017-10-05 NOTE — Assessment & Plan Note (Signed)
Her mood is stable, sleeps and eats well, continue Cymbalta 30mg  and Trazodone 50mg  qhs.

## 2017-10-09 ENCOUNTER — Encounter: Payer: Self-pay | Admitting: *Deleted

## 2017-10-10 DIAGNOSIS — G309 Alzheimer's disease, unspecified: Secondary | ICD-10-CM | POA: Diagnosis not present

## 2017-10-10 DIAGNOSIS — E039 Hypothyroidism, unspecified: Secondary | ICD-10-CM | POA: Diagnosis not present

## 2017-10-10 LAB — HEPATIC FUNCTION PANEL
ALK PHOS: 74 (ref 25–125)
ALT: 7 (ref 7–35)
AST: 11 — AB (ref 13–35)
BILIRUBIN, TOTAL: 0.5

## 2017-10-10 LAB — CBC AND DIFFERENTIAL
HEMATOCRIT: 40 (ref 36–46)
HEMOGLOBIN: 13.7 (ref 12.0–16.0)
PLATELETS: 241 (ref 150–399)
WBC: 7.1

## 2017-10-10 LAB — BASIC METABOLIC PANEL
BUN: 19 (ref 4–21)
Creatinine: 1 (ref 0.5–1.1)
Glucose: 102
Potassium: 4.3 (ref 3.4–5.3)
Sodium: 138 (ref 137–147)

## 2017-10-10 LAB — TSH: TSH: 2.19 (ref 0.41–5.90)

## 2017-10-17 ENCOUNTER — Non-Acute Institutional Stay: Payer: Medicare Other | Admitting: Internal Medicine

## 2017-10-17 ENCOUNTER — Encounter: Payer: Self-pay | Admitting: Internal Medicine

## 2017-10-17 DIAGNOSIS — G301 Alzheimer's disease with late onset: Secondary | ICD-10-CM

## 2017-10-17 DIAGNOSIS — E039 Hypothyroidism, unspecified: Secondary | ICD-10-CM | POA: Diagnosis not present

## 2017-10-17 DIAGNOSIS — I10 Essential (primary) hypertension: Secondary | ICD-10-CM

## 2017-10-17 DIAGNOSIS — F0280 Dementia in other diseases classified elsewhere without behavioral disturbance: Secondary | ICD-10-CM

## 2017-10-17 DIAGNOSIS — E538 Deficiency of other specified B group vitamins: Secondary | ICD-10-CM

## 2017-10-17 DIAGNOSIS — F329 Major depressive disorder, single episode, unspecified: Secondary | ICD-10-CM

## 2017-10-17 DIAGNOSIS — F341 Dysthymic disorder: Secondary | ICD-10-CM

## 2017-10-17 LAB — CMP 10231
Albumin: 4.1
CALCIUM: 10.6
CO2: 27
Chloride: 106
GFR CALC NON AF AMER: 51
Globulin: 2.9
TOTAL PROTEIN: 7

## 2017-10-17 NOTE — Progress Notes (Signed)
Location:  [place] Room Number: 101 Place of Service:  ALF [phone]) Provider:  Blanchie Serve MD  Blanchie Serve, MD  Patient Care Team: Blanchie Serve, MD as PCP - General (Internal Medicine) Mast, Man X, NP as Nurse Practitioner (Internal Medicine)  Extended Emergency Contact Information Primary Emergency Contact: [facility] Address: [address]          [place], [place] [address] [name] of [place] Phone: [phone] Relation: Son  Code Status:  DNR  Goals of care: Advanced Directive information Advanced Directives 10/17/2017  Does Patient Have a Medical Advance Directive? Yes  Type of Advance Directive Living will;Out of facility DNR (pink MOST or yellow form)  Does patient want to make changes to medical advance directive? No - Patient declined  Copy of [place] in Chart? -  Pre-existing out of facility DNR order (yellow form or pink MOST form) Yellow form placed in chart (order not valid for inpatient use)     Chief Complaint  Patient presents with  . Medical Management of Chronic Issues    Routine Visit     HPI:  Pt is a 84 y.o. female seen today for medical management of chronic diseases. She is pleasantly confused and denies any concern. No acute concern from nursing.    Hypertension- few elevated SBP readings in 150-160 range. Currently on metoprolol 75 mg bid and lisinopril 40 mg daily. Morning readings are elevated.  Hypothyroidism- currently on levothyroxine 25 mcg daily.   b12 deficiency- currently on b12 1000 mcg daily.   Dementia- no behavioral disturbance. Currently on namzaric 28-10 mg daily.   Depression- takes cymbalta and trazodone. Calm and appropriate mood this visit.     Past Medical History:  Diagnosis Date  . Alzheimer disease 10/27/2016  . Depression, recurrent ([place]) 10/27/2016  . HTN (hypertension) 10/27/2016  . Hypertension   . Hypertensive kidney disease with CKD (chronic kidney  disease) stage V ([place]) 11/02/2016   11/02/16 Na 136, K 4.7, Bun 30, creat 1.52, TSH 0.86, wbc 6.7, Hgb 11.3, plt 264  . Hypothyroidism 10/27/2016  . Osteoarthritis 10/27/2016  . Thyroid disease    Past Surgical History:  Procedure Laterality Date  . ABDOMINAL HYSTERECTOMY  1986  . KNEE ARTHROSCOPY  2015/2017   Dr. Gustavus Bryant  . SPINE SURGERY  2009, 2011   Dr.Mark Nicoletta Dress    No Known Allergies  Outpatient Encounter Medications as of 10/17/2017  Medication Sig  . acetaminophen (TYLENOL) 325 MG tablet Take 650 mg by mouth every 8 (eight) hours as needed for mild pain.   . cholecalciferol (VITAMIN D) 1000 units tablet Take 1,000 Units by mouth daily.  . DULoxetine (CYMBALTA) 30 MG capsule Take 30 mg by mouth at bedtime.   Marland Kitchen levothyroxine (SYNTHROID, LEVOTHROID) 25 MCG tablet Take 25 mcg by mouth daily before breakfast.  . lisinopril (PRINIVIL,ZESTRIL) 40 MG tablet Take 40 mg by mouth daily.   . Memantine HCl-Donepezil HCl (NAMZARIC) 28-10 MG CP24 Take 1 tablet by mouth at bedtime.   . metoprolol tartrate (LOPRESSOR) 50 MG tablet Take 75 mg by mouth 2 (two) times daily.   . traZODone (DESYREL) 50 MG tablet Take 25 mg by mouth at bedtime.   . vitamin B-12 (CYANOCOBALAMIN) 1000 MCG tablet Take 1,000 mcg by mouth daily.  . vitamin C (ASCORBIC ACID) 500 MG tablet Take 500 mg by mouth daily.   No facility-administered encounter medications on file as of 10/17/2017.     Review of Systems  Constitutional: Negative for appetite change, chills and fever.  HENT: Positive for hearing loss. Negative for congestion, nosebleeds, rhinorrhea, sore throat and trouble swallowing.   Eyes: Positive for visual disturbance.       Has corrective glasses  Respiratory: Negative for cough and shortness of breath.   Cardiovascular: Negative for chest pain and palpitations.  Gastrointestinal: Negative for abdominal pain, constipation, diarrhea, nausea and vomiting.  Genitourinary: Negative for dysuria and hematuria.        She is continent with her bowel and bladder  Musculoskeletal: Positive for gait problem. Negative for arthralgias and back pain.  Skin: Negative for rash and wound.  Neurological: Negative for dizziness and headaches.  Psychiatric/Behavioral: Positive for confusion. Negative for behavioral problems.    Immunization History  Administered Date(s) Administered  . Influenza-Unspecified 05/29/2017  . Pneumococcal Conjugate-13 06/21/2017  . Tdap 06/21/2017   Pertinent  Health Maintenance Due  Topic Date Due  . PNA vac Low Risk Adult (2 of 2 - PPSV23) 06/21/2018  . INFLUENZA VACCINE  Completed  . DEXA SCAN  Completed   Fall Risk  04/25/2017  Falls in the past year? No   Functional Status Survey:    Vitals:   10/17/17 1307  BP: (!) 155/70  Pulse: 70  Resp: 19  Temp: 98.9 F (37.2 C)  TempSrc: Oral  SpO2: 94%  Weight: 134 lb 8 oz (61 kg)  Height: 4\' 11"  (1.499 m)   Body mass index is 27.17 kg/m.   Wt Readings from Last 3 Encounters:  10/17/17 134 lb 8 oz (61 kg)  10/05/17 144 lb (65.3 kg)  07/21/17 142 lb (64.4 kg)   Physical Exam  Constitutional: She appears well-developed and well-nourished.  Elderly female in no acute distress  HENT:  Head: Normocephalic and atraumatic.  Nose: Nose normal.  Mouth/Throat: Oropharynx is clear and moist. No oropharyngeal exudate.  Eyes: Conjunctivae and EOM are normal. Pupils are equal, round, and reactive to light. Right eye exhibits no discharge. Left eye exhibits no discharge.  Neck: Neck supple.  Cardiovascular: Normal rate, regular rhythm and intact distal pulses.  Pulmonary/Chest: Effort normal and breath sounds normal. She has no wheezes. She has no rales. She exhibits no tenderness.  Abdominal: Soft. Bowel sounds are normal. There is no tenderness. There is no rebound and no guarding.  Musculoskeletal: She exhibits no tenderness.  Trace leg edema  Lymphadenopathy:    She has no cervical adenopathy.  Neurological: She is  alert.  Oriented to self only  Skin: Skin is warm and dry. No rash noted. She is not diaphoretic.  Psychiatric: She has a normal mood and affect. Her behavior is normal.    Labs reviewed: Recent Labs    11/01/16 02/02/17 10/10/17  NA 138 140 138  K 4.7 4.2 4.3  CL  --   --  106  CO2  --   --  27  BUN 30* 19 19  CREATININE 1.5* 1.2* 1.0  CALCIUM  --   --  10.6   Recent Labs    11/01/16 10/10/17  AST 11* 11*  ALT 6* 7  ALKPHOS 66 74  PROT  --  7.0  ALBUMIN  --  4.1   Recent Labs    11/01/16 10/10/17  WBC 6.7 7.1  HGB 11.3* 13.7  HCT 33* 40  PLT 264 241   Lab Results  Component Value Date   TSH 2.19 10/10/2017   Lab Results  Component Value Date   HGBA1C 5.3 11/01/2016  No results found for: CHOL, HDL, LDLCALC, LDLDIRECT, TRIG, CHOLHDL     Significant Diagnostic Results in last 30 days:  No results found.  Assessment/Plan  1. Acquired hypothyroidism Lab Results  Component Value Date   TSH 2.19 10/10/2017   Continue levothyroxine 25 mcg daily and monitor  2. Late onset Alzheimer's disease without behavioral disturbance Continue namzaric current regimen, supportive care  3. Major depression, chronic Stable, continue cymbalta and trazodone, no changes.   4. Essential hypertension Elevated SBP readings. Continue lisinopril 40 mg daily. Change metoprolol tartrate from 75 mg bid to 75 mg in am and 100 mg at bedtime. Check bp BID for 1 week, then daily x 2 weeks and assess further. Reviewed BMP, normal creatinine.   5. b12 deficiency On b12 supplement, check b12 level   Family/ staff Communication: reviewed care plan with patient and charge nurse.    Labs/tests ordered:  B12   Blanchie Serve, MD Internal Medicine [facility] [address] [place], [place] [address] Cell Phone (Monday-Friday 8 am - 5 pm): [phone] On Call: [phone] and follow prompts after 5 pm and on weekends Office Phone:  [phone] Office Fax: [phone]

## 2017-10-19 DIAGNOSIS — E538 Deficiency of other specified B group vitamins: Secondary | ICD-10-CM | POA: Diagnosis not present

## 2017-10-19 LAB — VITAMIN B12: VITAMIN B 12: 962

## 2017-11-22 DIAGNOSIS — R2681 Unsteadiness on feet: Secondary | ICD-10-CM | POA: Diagnosis not present

## 2017-11-22 DIAGNOSIS — M1990 Unspecified osteoarthritis, unspecified site: Secondary | ICD-10-CM | POA: Diagnosis not present

## 2017-11-22 DIAGNOSIS — E039 Hypothyroidism, unspecified: Secondary | ICD-10-CM | POA: Diagnosis not present

## 2017-11-22 DIAGNOSIS — I10 Essential (primary) hypertension: Secondary | ICD-10-CM | POA: Diagnosis not present

## 2017-11-22 DIAGNOSIS — M6281 Muscle weakness (generalized): Secondary | ICD-10-CM | POA: Diagnosis not present

## 2017-11-22 DIAGNOSIS — N185 Chronic kidney disease, stage 5: Secondary | ICD-10-CM | POA: Diagnosis not present

## 2017-11-22 DIAGNOSIS — I120 Hypertensive chronic kidney disease with stage 5 chronic kidney disease or end stage renal disease: Secondary | ICD-10-CM | POA: Diagnosis not present

## 2017-11-27 DIAGNOSIS — M6281 Muscle weakness (generalized): Secondary | ICD-10-CM | POA: Diagnosis not present

## 2017-11-27 DIAGNOSIS — M1990 Unspecified osteoarthritis, unspecified site: Secondary | ICD-10-CM | POA: Diagnosis not present

## 2017-11-27 DIAGNOSIS — N185 Chronic kidney disease, stage 5: Secondary | ICD-10-CM | POA: Diagnosis not present

## 2017-11-27 DIAGNOSIS — E039 Hypothyroidism, unspecified: Secondary | ICD-10-CM | POA: Diagnosis not present

## 2017-11-27 DIAGNOSIS — R2681 Unsteadiness on feet: Secondary | ICD-10-CM | POA: Diagnosis not present

## 2017-11-27 DIAGNOSIS — I120 Hypertensive chronic kidney disease with stage 5 chronic kidney disease or end stage renal disease: Secondary | ICD-10-CM | POA: Diagnosis not present

## 2017-11-28 DIAGNOSIS — N185 Chronic kidney disease, stage 5: Secondary | ICD-10-CM | POA: Diagnosis not present

## 2017-11-28 DIAGNOSIS — M6281 Muscle weakness (generalized): Secondary | ICD-10-CM | POA: Diagnosis not present

## 2017-11-28 DIAGNOSIS — R2681 Unsteadiness on feet: Secondary | ICD-10-CM | POA: Diagnosis not present

## 2017-11-28 DIAGNOSIS — E039 Hypothyroidism, unspecified: Secondary | ICD-10-CM | POA: Diagnosis not present

## 2017-11-28 DIAGNOSIS — I120 Hypertensive chronic kidney disease with stage 5 chronic kidney disease or end stage renal disease: Secondary | ICD-10-CM | POA: Diagnosis not present

## 2017-11-28 DIAGNOSIS — M1990 Unspecified osteoarthritis, unspecified site: Secondary | ICD-10-CM | POA: Diagnosis not present

## 2017-11-29 DIAGNOSIS — M1990 Unspecified osteoarthritis, unspecified site: Secondary | ICD-10-CM | POA: Diagnosis not present

## 2017-11-29 DIAGNOSIS — I120 Hypertensive chronic kidney disease with stage 5 chronic kidney disease or end stage renal disease: Secondary | ICD-10-CM | POA: Diagnosis not present

## 2017-11-29 DIAGNOSIS — N185 Chronic kidney disease, stage 5: Secondary | ICD-10-CM | POA: Diagnosis not present

## 2017-11-29 DIAGNOSIS — M6281 Muscle weakness (generalized): Secondary | ICD-10-CM | POA: Diagnosis not present

## 2017-11-29 DIAGNOSIS — R2681 Unsteadiness on feet: Secondary | ICD-10-CM | POA: Diagnosis not present

## 2017-11-29 DIAGNOSIS — E039 Hypothyroidism, unspecified: Secondary | ICD-10-CM | POA: Diagnosis not present

## 2017-12-01 DIAGNOSIS — R2681 Unsteadiness on feet: Secondary | ICD-10-CM | POA: Diagnosis not present

## 2017-12-01 DIAGNOSIS — E039 Hypothyroidism, unspecified: Secondary | ICD-10-CM | POA: Diagnosis not present

## 2017-12-01 DIAGNOSIS — I120 Hypertensive chronic kidney disease with stage 5 chronic kidney disease or end stage renal disease: Secondary | ICD-10-CM | POA: Diagnosis not present

## 2017-12-01 DIAGNOSIS — M1990 Unspecified osteoarthritis, unspecified site: Secondary | ICD-10-CM | POA: Diagnosis not present

## 2017-12-01 DIAGNOSIS — M6281 Muscle weakness (generalized): Secondary | ICD-10-CM | POA: Diagnosis not present

## 2017-12-01 DIAGNOSIS — N185 Chronic kidney disease, stage 5: Secondary | ICD-10-CM | POA: Diagnosis not present

## 2017-12-04 DIAGNOSIS — M6281 Muscle weakness (generalized): Secondary | ICD-10-CM | POA: Diagnosis not present

## 2017-12-04 DIAGNOSIS — E039 Hypothyroidism, unspecified: Secondary | ICD-10-CM | POA: Diagnosis not present

## 2017-12-04 DIAGNOSIS — I120 Hypertensive chronic kidney disease with stage 5 chronic kidney disease or end stage renal disease: Secondary | ICD-10-CM | POA: Diagnosis not present

## 2017-12-04 DIAGNOSIS — M1990 Unspecified osteoarthritis, unspecified site: Secondary | ICD-10-CM | POA: Diagnosis not present

## 2017-12-04 DIAGNOSIS — N185 Chronic kidney disease, stage 5: Secondary | ICD-10-CM | POA: Diagnosis not present

## 2017-12-04 DIAGNOSIS — R2681 Unsteadiness on feet: Secondary | ICD-10-CM | POA: Diagnosis not present

## 2017-12-07 DIAGNOSIS — R2681 Unsteadiness on feet: Secondary | ICD-10-CM | POA: Diagnosis not present

## 2017-12-07 DIAGNOSIS — R278 Other lack of coordination: Secondary | ICD-10-CM | POA: Diagnosis not present

## 2017-12-07 DIAGNOSIS — N3946 Mixed incontinence: Secondary | ICD-10-CM | POA: Diagnosis not present

## 2017-12-07 DIAGNOSIS — R41841 Cognitive communication deficit: Secondary | ICD-10-CM | POA: Diagnosis not present

## 2017-12-07 DIAGNOSIS — M1990 Unspecified osteoarthritis, unspecified site: Secondary | ICD-10-CM | POA: Diagnosis not present

## 2017-12-07 DIAGNOSIS — M6281 Muscle weakness (generalized): Secondary | ICD-10-CM | POA: Diagnosis not present

## 2017-12-08 DIAGNOSIS — R278 Other lack of coordination: Secondary | ICD-10-CM | POA: Diagnosis not present

## 2017-12-08 DIAGNOSIS — R2681 Unsteadiness on feet: Secondary | ICD-10-CM | POA: Diagnosis not present

## 2017-12-08 DIAGNOSIS — M1990 Unspecified osteoarthritis, unspecified site: Secondary | ICD-10-CM | POA: Diagnosis not present

## 2017-12-08 DIAGNOSIS — R41841 Cognitive communication deficit: Secondary | ICD-10-CM | POA: Diagnosis not present

## 2017-12-08 DIAGNOSIS — M6281 Muscle weakness (generalized): Secondary | ICD-10-CM | POA: Diagnosis not present

## 2017-12-08 DIAGNOSIS — N3946 Mixed incontinence: Secondary | ICD-10-CM | POA: Diagnosis not present

## 2017-12-12 DIAGNOSIS — M6281 Muscle weakness (generalized): Secondary | ICD-10-CM | POA: Diagnosis not present

## 2017-12-12 DIAGNOSIS — N3946 Mixed incontinence: Secondary | ICD-10-CM | POA: Diagnosis not present

## 2017-12-12 DIAGNOSIS — R2681 Unsteadiness on feet: Secondary | ICD-10-CM | POA: Diagnosis not present

## 2017-12-12 DIAGNOSIS — M1990 Unspecified osteoarthritis, unspecified site: Secondary | ICD-10-CM | POA: Diagnosis not present

## 2017-12-12 DIAGNOSIS — R41841 Cognitive communication deficit: Secondary | ICD-10-CM | POA: Diagnosis not present

## 2017-12-12 DIAGNOSIS — R278 Other lack of coordination: Secondary | ICD-10-CM | POA: Diagnosis not present

## 2017-12-13 DIAGNOSIS — R41841 Cognitive communication deficit: Secondary | ICD-10-CM | POA: Diagnosis not present

## 2017-12-13 DIAGNOSIS — M1990 Unspecified osteoarthritis, unspecified site: Secondary | ICD-10-CM | POA: Diagnosis not present

## 2017-12-13 DIAGNOSIS — N3946 Mixed incontinence: Secondary | ICD-10-CM | POA: Diagnosis not present

## 2017-12-13 DIAGNOSIS — M6281 Muscle weakness (generalized): Secondary | ICD-10-CM | POA: Diagnosis not present

## 2017-12-13 DIAGNOSIS — R278 Other lack of coordination: Secondary | ICD-10-CM | POA: Diagnosis not present

## 2017-12-13 DIAGNOSIS — R2681 Unsteadiness on feet: Secondary | ICD-10-CM | POA: Diagnosis not present

## 2017-12-14 DIAGNOSIS — M1990 Unspecified osteoarthritis, unspecified site: Secondary | ICD-10-CM | POA: Diagnosis not present

## 2017-12-14 DIAGNOSIS — R278 Other lack of coordination: Secondary | ICD-10-CM | POA: Diagnosis not present

## 2017-12-14 DIAGNOSIS — R41841 Cognitive communication deficit: Secondary | ICD-10-CM | POA: Diagnosis not present

## 2017-12-14 DIAGNOSIS — N3946 Mixed incontinence: Secondary | ICD-10-CM | POA: Diagnosis not present

## 2017-12-14 DIAGNOSIS — R2681 Unsteadiness on feet: Secondary | ICD-10-CM | POA: Diagnosis not present

## 2017-12-14 DIAGNOSIS — M6281 Muscle weakness (generalized): Secondary | ICD-10-CM | POA: Diagnosis not present

## 2017-12-15 DIAGNOSIS — M6281 Muscle weakness (generalized): Secondary | ICD-10-CM | POA: Diagnosis not present

## 2017-12-15 DIAGNOSIS — N3946 Mixed incontinence: Secondary | ICD-10-CM | POA: Diagnosis not present

## 2017-12-15 DIAGNOSIS — M1990 Unspecified osteoarthritis, unspecified site: Secondary | ICD-10-CM | POA: Diagnosis not present

## 2017-12-15 DIAGNOSIS — R41841 Cognitive communication deficit: Secondary | ICD-10-CM | POA: Diagnosis not present

## 2017-12-15 DIAGNOSIS — R2681 Unsteadiness on feet: Secondary | ICD-10-CM | POA: Diagnosis not present

## 2017-12-15 DIAGNOSIS — R278 Other lack of coordination: Secondary | ICD-10-CM | POA: Diagnosis not present

## 2017-12-18 DIAGNOSIS — M6281 Muscle weakness (generalized): Secondary | ICD-10-CM | POA: Diagnosis not present

## 2017-12-18 DIAGNOSIS — M1990 Unspecified osteoarthritis, unspecified site: Secondary | ICD-10-CM | POA: Diagnosis not present

## 2017-12-18 DIAGNOSIS — R41841 Cognitive communication deficit: Secondary | ICD-10-CM | POA: Diagnosis not present

## 2017-12-18 DIAGNOSIS — R2681 Unsteadiness on feet: Secondary | ICD-10-CM | POA: Diagnosis not present

## 2017-12-18 DIAGNOSIS — R278 Other lack of coordination: Secondary | ICD-10-CM | POA: Diagnosis not present

## 2017-12-18 DIAGNOSIS — N3946 Mixed incontinence: Secondary | ICD-10-CM | POA: Diagnosis not present

## 2017-12-19 DIAGNOSIS — N3946 Mixed incontinence: Secondary | ICD-10-CM | POA: Diagnosis not present

## 2017-12-19 DIAGNOSIS — M1990 Unspecified osteoarthritis, unspecified site: Secondary | ICD-10-CM | POA: Diagnosis not present

## 2017-12-19 DIAGNOSIS — R41841 Cognitive communication deficit: Secondary | ICD-10-CM | POA: Diagnosis not present

## 2017-12-19 DIAGNOSIS — M6281 Muscle weakness (generalized): Secondary | ICD-10-CM | POA: Diagnosis not present

## 2017-12-19 DIAGNOSIS — R278 Other lack of coordination: Secondary | ICD-10-CM | POA: Diagnosis not present

## 2017-12-19 DIAGNOSIS — R2681 Unsteadiness on feet: Secondary | ICD-10-CM | POA: Diagnosis not present

## 2017-12-20 DIAGNOSIS — R41841 Cognitive communication deficit: Secondary | ICD-10-CM | POA: Diagnosis not present

## 2017-12-20 DIAGNOSIS — N3946 Mixed incontinence: Secondary | ICD-10-CM | POA: Diagnosis not present

## 2017-12-20 DIAGNOSIS — R278 Other lack of coordination: Secondary | ICD-10-CM | POA: Diagnosis not present

## 2017-12-20 DIAGNOSIS — R2681 Unsteadiness on feet: Secondary | ICD-10-CM | POA: Diagnosis not present

## 2017-12-20 DIAGNOSIS — M6281 Muscle weakness (generalized): Secondary | ICD-10-CM | POA: Diagnosis not present

## 2017-12-20 DIAGNOSIS — M1990 Unspecified osteoarthritis, unspecified site: Secondary | ICD-10-CM | POA: Diagnosis not present

## 2017-12-22 DIAGNOSIS — R278 Other lack of coordination: Secondary | ICD-10-CM | POA: Diagnosis not present

## 2017-12-22 DIAGNOSIS — R2681 Unsteadiness on feet: Secondary | ICD-10-CM | POA: Diagnosis not present

## 2017-12-22 DIAGNOSIS — N3946 Mixed incontinence: Secondary | ICD-10-CM | POA: Diagnosis not present

## 2017-12-22 DIAGNOSIS — R41841 Cognitive communication deficit: Secondary | ICD-10-CM | POA: Diagnosis not present

## 2017-12-22 DIAGNOSIS — M6281 Muscle weakness (generalized): Secondary | ICD-10-CM | POA: Diagnosis not present

## 2017-12-22 DIAGNOSIS — M1990 Unspecified osteoarthritis, unspecified site: Secondary | ICD-10-CM | POA: Diagnosis not present

## 2017-12-25 DIAGNOSIS — N3946 Mixed incontinence: Secondary | ICD-10-CM | POA: Diagnosis not present

## 2017-12-25 DIAGNOSIS — R278 Other lack of coordination: Secondary | ICD-10-CM | POA: Diagnosis not present

## 2017-12-25 DIAGNOSIS — M1990 Unspecified osteoarthritis, unspecified site: Secondary | ICD-10-CM | POA: Diagnosis not present

## 2017-12-25 DIAGNOSIS — R2681 Unsteadiness on feet: Secondary | ICD-10-CM | POA: Diagnosis not present

## 2017-12-25 DIAGNOSIS — R41841 Cognitive communication deficit: Secondary | ICD-10-CM | POA: Diagnosis not present

## 2017-12-25 DIAGNOSIS — M6281 Muscle weakness (generalized): Secondary | ICD-10-CM | POA: Diagnosis not present

## 2017-12-27 DIAGNOSIS — M6281 Muscle weakness (generalized): Secondary | ICD-10-CM | POA: Diagnosis not present

## 2017-12-27 DIAGNOSIS — R278 Other lack of coordination: Secondary | ICD-10-CM | POA: Diagnosis not present

## 2017-12-27 DIAGNOSIS — M1990 Unspecified osteoarthritis, unspecified site: Secondary | ICD-10-CM | POA: Diagnosis not present

## 2017-12-27 DIAGNOSIS — R2681 Unsteadiness on feet: Secondary | ICD-10-CM | POA: Diagnosis not present

## 2017-12-27 DIAGNOSIS — R41841 Cognitive communication deficit: Secondary | ICD-10-CM | POA: Diagnosis not present

## 2017-12-27 DIAGNOSIS — N3946 Mixed incontinence: Secondary | ICD-10-CM | POA: Diagnosis not present

## 2017-12-29 DIAGNOSIS — M1990 Unspecified osteoarthritis, unspecified site: Secondary | ICD-10-CM | POA: Diagnosis not present

## 2017-12-29 DIAGNOSIS — N3946 Mixed incontinence: Secondary | ICD-10-CM | POA: Diagnosis not present

## 2017-12-29 DIAGNOSIS — R2681 Unsteadiness on feet: Secondary | ICD-10-CM | POA: Diagnosis not present

## 2017-12-29 DIAGNOSIS — R41841 Cognitive communication deficit: Secondary | ICD-10-CM | POA: Diagnosis not present

## 2017-12-29 DIAGNOSIS — R278 Other lack of coordination: Secondary | ICD-10-CM | POA: Diagnosis not present

## 2017-12-29 DIAGNOSIS — M6281 Muscle weakness (generalized): Secondary | ICD-10-CM | POA: Diagnosis not present

## 2018-01-17 ENCOUNTER — Encounter: Payer: Self-pay | Admitting: Nurse Practitioner

## 2018-01-17 ENCOUNTER — Non-Acute Institutional Stay: Payer: Medicare Other | Admitting: Nurse Practitioner

## 2018-01-17 DIAGNOSIS — F329 Major depressive disorder, single episode, unspecified: Secondary | ICD-10-CM

## 2018-01-17 DIAGNOSIS — F0280 Dementia in other diseases classified elsewhere without behavioral disturbance: Secondary | ICD-10-CM

## 2018-01-17 DIAGNOSIS — G301 Alzheimer's disease with late onset: Secondary | ICD-10-CM

## 2018-01-17 DIAGNOSIS — E039 Hypothyroidism, unspecified: Secondary | ICD-10-CM

## 2018-01-17 DIAGNOSIS — I10 Essential (primary) hypertension: Secondary | ICD-10-CM

## 2018-01-17 NOTE — Progress Notes (Signed)
Location:  [place] Room Number: 297 Place of Service:  ALF [phone]) Provider:  Juliane Guest, Lennie Odor  NP  Blanchie Serve, MD  Patient Care Team: Blanchie Serve, MD as PCP - General (Internal Medicine) Brookelle Pellicane X, NP as Nurse Practitioner (Internal Medicine)  Extended Emergency Contact Information Primary Emergency Contact: [facility] Address: [address]          [place],  [address] [name] of [place] Phone: [phone] Relation: Son  Code Status:  DNR Goals of care: Advanced Directive information Advanced Directives 01/17/2018  Does Patient Have a Medical Advance Directive? Yes  Type of Paramedic of [place];Out of facility DNR (pink MOST or yellow form);Living will  Does patient want to make changes to medical advance directive? No - Patient declined  Copy of [place] in Chart? Yes  Pre-existing out of facility DNR order (yellow form or pink MOST form) Yellow form placed in chart (order not valid for inpatient use)     Chief Complaint  Patient presents with  . Medical Management of Chronic Issues    F/u- Alzheimers, depression, hypothyroidism, HTN,    HPI:  Pt is a 84 y.o. female seen today for medical management of chronic diseases.     The patient has hx of depression, her mood is stabilized on Duloxetine 30mg  qd, she sleeps and eats well while on Trazodone 25mg  qd. Her blood pressure is controlled on Metoprolol 75mg  qd, 100mg  qd, Lisinopril 40mg  qd. Hx of hypothyroidism, on Levothyroxine 42mcg qd, last TSH 2.19 10/10/17. She resides in [facility], ambulates with walker, memory is preserved on Namzaric 28/10 qd.  Past Medical History:  Diagnosis Date  . Alzheimer disease 10/27/2016  . Depression, recurrent ([place]) 10/27/2016  . HTN (hypertension) 10/27/2016  . Hypertension   . Hypertensive kidney disease with CKD (chronic kidney disease) stage V ([place]) 11/02/2016   11/02/16 Na 136, K 4.7, Bun 30,  creat 1.52, TSH 0.86, wbc 6.7, Hgb 11.3, plt 264  . Hypothyroidism 10/27/2016  . Osteoarthritis 10/27/2016  . Thyroid disease    Past Surgical History:  Procedure Laterality Date  . ABDOMINAL HYSTERECTOMY  1986  . KNEE ARTHROSCOPY  2015/2017   Dr. Gustavus Bryant  . SPINE SURGERY  2009, 2011   Dr.Mark Nicoletta Dress    No Known Allergies  Outpatient Encounter Medications as of 01/17/2018  Medication Sig  . acetaminophen (TYLENOL) 325 MG tablet Take 650 mg by mouth every 8 (eight) hours as needed for mild pain.   . cholecalciferol (VITAMIN D) 1000 units tablet Take 1,000 Units by mouth daily.  . DULoxetine (CYMBALTA) 30 MG capsule Take 30 mg by mouth at bedtime.   Marland Kitchen levothyroxine (SYNTHROID, LEVOTHROID) 25 MCG tablet Take 25 mcg by mouth daily before breakfast.  . lisinopril (PRINIVIL,ZESTRIL) 40 MG tablet Take 40 mg by mouth daily.   . Memantine HCl-Donepezil HCl (NAMZARIC) 28-10 MG CP24 Take 1 tablet by mouth at bedtime.   . metoprolol tartrate (LOPRESSOR) 100 MG tablet TAKE 100 MG DAILY IN THE EVENING HOLD FOR SBP<110 AND OR HR<60 /MIN  . Metoprolol Tartrate 75 MG TABS METOPROLOL TARTRATE 75 MG BY DAILY HOLD FOR SBP <110 AND OR HR <60/MIN.  . traZODone (DESYREL) 50 MG tablet Take 25 mg by mouth at bedtime.   . vitamin B-12 (CYANOCOBALAMIN) 1000 MCG tablet Take 1,000 mcg by mouth daily.  . vitamin C (ASCORBIC ACID) 500 MG tablet Take 500 mg by mouth daily.  . [DISCONTINUED] metoprolol tartrate (  LOPRESSOR) 50 MG tablet Take 75 mg by mouth 2 (two) times daily.    No facility-administered encounter medications on file as of 01/17/2018.     Review of Systems  Constitutional: Negative for activity change, appetite change, chills, diaphoresis, fatigue and fever.       Gradual weight gain.   HENT: Positive for hearing loss. Negative for congestion, trouble swallowing and voice change.   Eyes: Negative for visual disturbance.  Respiratory: Negative for cough, shortness of breath and wheezing.     Cardiovascular: Positive for leg swelling. Negative for chest pain and palpitations.  Gastrointestinal: Negative for abdominal distention, abdominal pain, constipation, diarrhea, nausea and vomiting.  Genitourinary: Negative for difficulty urinating, dysuria and urgency.  Musculoskeletal: Positive for arthralgias and gait problem.  Skin: Negative for color change and pallor.  Neurological: Negative for dizziness, speech difficulty, weakness and headaches.       Dementia  Psychiatric/Behavioral: Positive for confusion. Negative for agitation, behavioral problems, hallucinations and sleep disturbance. The patient is not nervous/anxious.     Immunization History  Administered Date(s) Administered  . Influenza-Unspecified 05/29/2017  . Pneumococcal Conjugate-13 06/21/2017  . Tdap 06/21/2017   Pertinent  Health Maintenance Due  Topic Date Due  . INFLUENZA VACCINE  03/08/2018  . PNA vac Low Risk Adult (2 of 2 - PPSV23) 06/21/2018  . DEXA SCAN  Completed   Fall Risk  04/25/2017  Falls in the past year? No   Functional Status Survey:    Vitals:   01/17/18 1046  BP: 140/60  Pulse: 76  Resp: 18  Temp: 98.1 F (36.7 C)  Weight: 156 lb 9.6 oz (71 kg)  Height: 4\' 11"  (1.499 m)   Body mass index is 31.63 kg/m. Physical Exam  Constitutional: She appears well-developed and well-nourished.  HENT:  Head: Normocephalic and atraumatic.  Eyes: Pupils are equal, round, and reactive to light. EOM are normal.  Neck: Normal range of motion. Neck supple. No JVD present. No thyromegaly present.  Cardiovascular: Normal rate and regular rhythm.  No murmur heard. Pulmonary/Chest: Effort normal and breath sounds normal. She has no wheezes. She has no rales.  Abdominal: Soft. Bowel sounds are normal. She exhibits no distension. There is no tenderness.  Musculoskeletal: She exhibits edema.  Trace edema. Ambulates with walker   Neurological: She is alert. No cranial nerve deficit. She exhibits  normal muscle tone. Coordination normal.  Oriented to person and place.   Skin: Skin is warm and dry.  Psychiatric: She has a normal mood and affect. Her behavior is normal.    Labs reviewed: Recent Labs    02/02/17 10/10/17  NA 140 138  K 4.2 4.3  CL  --  106  CO2  --  27  BUN 19 19  CREATININE 1.2* 1.0  CALCIUM  --  10.6   Recent Labs    10/10/17  AST 11*  ALT 7  ALKPHOS 74  PROT 7.0  ALBUMIN 4.1   Recent Labs    10/10/17  WBC 7.1  HGB 13.7  HCT 40  PLT 241   Lab Results  Component Value Date   TSH 2.19 10/10/2017   Lab Results  Component Value Date   HGBA1C 5.3 11/01/2016   No results found for: CHOL, HDL, LDLCALC, LDLDIRECT, TRIG, CHOLHDL  Significant Diagnostic Results in last 30 days:  No results found.  Assessment/Plan Major depression, chronic her mood is stabilized, continue Duloxetine 30mg  qd, she sleeps and eats well, continue Trazodone 25mg  qd.  HTN (hypertension) Her blood pressure is controlled, continue Metoprolol 75mg  qd, 100mg  qd, Lisinopril 40mg  qd.   Hypothyroidism Hx of hypothyroidism, continue Levothyroxine 68mcg qd, last TSH 2.19 10/10/17.  Alzheimer disease  She resides in [facility], ambulates with walker, continue Namzaric 28/10 qd to preserve memory.       Family/ staff Communication: plan of care reviewed with the patient and charge nurse.   Labs/tests ordered:  None  Time spend 25 minutes

## 2018-01-17 NOTE — Assessment & Plan Note (Signed)
her mood is stabilized, continue Duloxetine 30mg  qd, she sleeps and eats well, continue Trazodone 25mg  qd.

## 2018-01-17 NOTE — Assessment & Plan Note (Signed)
She resides in [facility], ambulates with walker, continue Namzaric 28/10 qd to preserve memory.

## 2018-01-17 NOTE — Assessment & Plan Note (Signed)
Hx of hypothyroidism, continue Levothyroxine 67mcg qd, last TSH 2.19 10/10/17.

## 2018-01-17 NOTE — Assessment & Plan Note (Signed)
Her blood pressure is controlled, continue Metoprolol 75mg  qd, 100mg  qd, Lisinopril 40mg  qd.

## 2018-02-02 ENCOUNTER — Non-Acute Institutional Stay: Payer: Medicare Other | Admitting: Nurse Practitioner

## 2018-02-02 ENCOUNTER — Encounter: Payer: Self-pay | Admitting: Nurse Practitioner

## 2018-02-02 DIAGNOSIS — R102 Pelvic and perineal pain: Secondary | ICD-10-CM | POA: Diagnosis not present

## 2018-02-02 DIAGNOSIS — G301 Alzheimer's disease with late onset: Secondary | ICD-10-CM

## 2018-02-02 DIAGNOSIS — E039 Hypothyroidism, unspecified: Secondary | ICD-10-CM

## 2018-02-02 DIAGNOSIS — M79652 Pain in left thigh: Secondary | ICD-10-CM | POA: Diagnosis not present

## 2018-02-02 DIAGNOSIS — M25552 Pain in left hip: Secondary | ICD-10-CM

## 2018-02-02 DIAGNOSIS — W19XXXA Unspecified fall, initial encounter: Secondary | ICD-10-CM | POA: Diagnosis not present

## 2018-02-02 DIAGNOSIS — I10 Essential (primary) hypertension: Secondary | ICD-10-CM | POA: Diagnosis not present

## 2018-02-02 DIAGNOSIS — F0280 Dementia in other diseases classified elsewhere without behavioral disturbance: Secondary | ICD-10-CM

## 2018-02-02 DIAGNOSIS — R296 Repeated falls: Secondary | ICD-10-CM | POA: Insufficient documentation

## 2018-02-02 DIAGNOSIS — M25562 Pain in left knee: Secondary | ICD-10-CM | POA: Diagnosis not present

## 2018-02-02 NOTE — Assessment & Plan Note (Signed)
Left hip/thigh pain sustained from fall last night. Will obtain X-ray pelvis, L hip, L femur, and L knee. Will schedule Tylenol 650mg  q8h x 5days. Observe.

## 2018-02-02 NOTE — Assessment & Plan Note (Signed)
Update TSH, continue Levothyroxine 75mcg daily.

## 2018-02-02 NOTE — Assessment & Plan Note (Addendum)
The patient reported @ 2:30am to staff she fell about an hour ago, stated she just slipped when she got up to bathroom, ambulated with walker. She complained left hip/thigh pain regardless what she does, difficulty bearing weight or ambulating. No apparent deformity noted. Will update CBC/diff, CMP. Close supervision needed since the patient has lack of safety awareness related to her dementia.

## 2018-02-02 NOTE — Assessment & Plan Note (Signed)
resides in [facility], ambulates with walker, on Namzaric for memory. Close supervision for safety.

## 2018-02-02 NOTE — Progress Notes (Signed)
Location:   [facility] Room Number: 149 Place of Service:  ALF (13)[facility] Provider: Lennie Odor Xavious Sharrar NP  Blanchie Serve, MD  Patient Care Team: Blanchie Serve, MD as PCP - General (Internal Medicine) Mcclain Shall X, NP as Nurse Practitioner (Internal Medicine)  Extended Emergency Contact Information Primary Emergency Contact: [facility] Address: [address]          [place], [place] [address] [name] of [place] Phone: [phone] Relation: Son  Code Status: DNR Goals of care: Advanced Directive information Advanced Directives 01/17/2018  Does Patient Have a Medical Advance Directive? Yes  Type of Paramedic of [place];Out of facility DNR (pink MOST or yellow form);Living will  Does patient want to make changes to medical advance directive? No - Patient declined  Copy of [place] in Chart? Yes  Pre-existing out of facility DNR order (yellow form or pink MOST form) Yellow form placed in chart (order not valid for inpatient use)     Chief Complaint  Patient presents with  . Acute Visit    left hip/thigh pain, fall  . Dementia    HPI:  Pt is a 84 y.o. female seen today for an acute visit for left hip/thigh pain, elevated BP, s/p fall.  The patient reported @ 2:30am to staff she fell about an hour ago, stated she just slipped when she got up to bathroom, ambulated with walker. She complained left hip/thigh pain regardless what she does, difficulty bearing weight or ambulating. She has history of dementia, resides [facility], ambulates with walker, on Namzaric for memory. Hx of HTN, on Metoprolol 100mg  qd, 75mg  qd, Lisinopril 40mg  qd. Hypothyroidism, taking Levothyroxine 64mcg, last TSH wnl 10/2017   Past Medical History:  Diagnosis Date  . Alzheimer disease 10/27/2016  . Depression, recurrent ([place]) 10/27/2016  . HTN (hypertension) 10/27/2016  . Hypertension   . Hypertensive kidney disease with CKD (chronic kidney disease)  stage V ([place]) 11/02/2016   11/02/16 Na 136, K 4.7, Bun 30, creat 1.52, TSH 0.86, wbc 6.7, Hgb 11.3, plt 264  . Hypothyroidism 10/27/2016  . Osteoarthritis 10/27/2016  . Thyroid disease    Past Surgical History:  Procedure Laterality Date  . ABDOMINAL HYSTERECTOMY  1986  . KNEE ARTHROSCOPY  2015/2017   Dr. Gustavus Bryant  . SPINE SURGERY  2009, 2011   Dr.Mark Nicoletta Dress    No Known Allergies  Allergies as of 02/02/2018   No Known Allergies     Medication List        Accurate as of 02/02/18 11:59 PM. Always use your most recent med list.          acetaminophen 325 MG tablet Commonly known as:  TYLENOL Take 650 mg by mouth every 8 (eight) hours as needed for mild pain.   cholecalciferol 1000 units tablet Commonly known as:  VITAMIN D Take 1,000 Units by mouth daily.   DULoxetine 30 MG capsule Commonly known as:  CYMBALTA Take 30 mg by mouth at bedtime.   levothyroxine 25 MCG tablet Commonly known as:  SYNTHROID, LEVOTHROID Take 25 mcg by mouth daily before breakfast.   lisinopril 40 MG tablet Commonly known as:  PRINIVIL,ZESTRIL Take 40 mg by mouth daily.   Metoprolol Tartrate 75 MG Tabs METOPROLOL TARTRATE 75 MG BY DAILY HOLD FOR SBP <110 AND OR HR <60/MIN.   metoprolol tartrate 100 MG tablet Commonly known as:  LOPRESSOR TAKE 100 MG DAILY IN THE EVENING HOLD FOR SBP<110 AND OR HR<60 /  MIN   NAMZARIC 28-10 MG Cp24 Generic drug:  Memantine HCl-Donepezil HCl Take 1 tablet by mouth at bedtime.   traZODone 50 MG tablet Commonly known as:  DESYREL Take 25 mg by mouth at bedtime.   vitamin B-12 1000 MCG tablet Commonly known as:  CYANOCOBALAMIN Take 1,000 mcg by mouth daily.   vitamin C 500 MG tablet Commonly known as:  ASCORBIC ACID Take 500 mg by mouth daily.      ROS was provided with assistance of staff.  Review of Systems  Constitutional: Positive for activity change. Negative for appetite change, chills, diaphoresis, fatigue and fever.  HENT: Positive for hearing  loss. Negative for congestion, trouble swallowing and voice change.   Respiratory: Negative for cough, shortness of breath and wheezing.   Cardiovascular: Positive for leg swelling. Negative for chest pain and palpitations.  Gastrointestinal: Negative for abdominal distention, constipation, diarrhea, nausea and vomiting.  Genitourinary: Negative for difficulty urinating, dysuria and urgency.  Musculoskeletal: Positive for arthralgias and gait problem. Negative for back pain, joint swelling, neck pain and neck stiffness.  Skin: Negative for color change and pallor.  Neurological: Negative for dizziness, facial asymmetry, speech difficulty, weakness and headaches.       Dementia  Psychiatric/Behavioral: Positive for confusion. Negative for agitation, behavioral problems, hallucinations and sleep disturbance. The patient is not nervous/anxious.     Immunization History  Administered Date(s) Administered  . Influenza-Unspecified 05/29/2017  . Pneumococcal Conjugate-13 06/21/2017  . Tdap 06/21/2017   Pertinent  Health Maintenance Due  Topic Date Due  . INFLUENZA VACCINE  03/08/2018  . PNA vac Low Risk Adult (2 of 2 - PPSV23) 06/21/2018  . DEXA SCAN  Completed   Fall Risk  04/25/2017  Falls in the past year? No   Functional Status Survey:    Vitals:   02/02/18 1117  BP: (!) 160/80  Pulse: 85  Resp: 18  Temp: 99.5 F (37.5 C)   There is no height or weight on file to calculate BMI. Physical Exam  Constitutional: She appears well-developed and well-nourished. No distress.  HENT:  Head: Normocephalic and atraumatic.  Eyes: Pupils are equal, round, and reactive to light. EOM are normal.  Neck: Normal range of motion. Neck supple. No JVD present. No thyromegaly present.  Cardiovascular: Normal rate and regular rhythm.  No murmur heard. Pulmonary/Chest: Effort normal and breath sounds normal. She has no wheezes. She has no rales.  Abdominal: Soft. Bowel sounds are normal.    Musculoskeletal: She exhibits edema and tenderness.  Trace edema BLE. Pain in the left hip, thigh regardless what she does, worse with weight bearing. No shortening of the left leg comparing to the right leg.   Neurological: She is alert. No cranial nerve deficit. She exhibits normal muscle tone. Coordination normal.  Oriented to person and place.   Skin: Skin is warm and dry. She is not diaphoretic. No erythema.  Right knee surgical scar  Psychiatric: She has a normal mood and affect. Her behavior is normal.    Labs reviewed: Recent Labs    10/10/17  NA 138  K 4.3  CL 106  CO2 27  BUN 19  CREATININE 1.0  CALCIUM 10.6   Recent Labs    10/10/17  AST 11*  ALT 7  ALKPHOS 74  PROT 7.0  ALBUMIN 4.1   Recent Labs    10/10/17  WBC 7.1  HGB 13.7  HCT 40  PLT 241   Lab Results  Component Value Date  TSH 2.19 10/10/2017   Lab Results  Component Value Date   HGBA1C 5.3 11/01/2016   No results found for: CHOL, HDL, LDLCALC, LDLDIRECT, TRIG, CHOLHDL  Significant Diagnostic Results in last 30 days:  No results found.  Assessment/Plan: Fall The patient reported @ 2:30am to staff she fell about an hour ago, stated she just slipped when she got up to bathroom, ambulated with walker. She complained left hip/thigh pain regardless what she does, difficulty bearing weight or ambulating. No apparent deformity noted. Will update CBC/diff, CMP. Close supervision needed since the patient has lack of safety awareness related to her dementia.   Acute hip pain, left Left hip/thigh pain sustained from fall last night. Will obtain X-ray pelvis, L hip, L femur, and L knee. Will schedule Tylenol 650mg  q8h x 5days. Observe.   Alzheimer disease resides in [facility], ambulates with walker, on Namzaric for memory. Close supervision for safety.   Hypothyroidism Update TSH, continue Levothyroxine 5mcg daily.   HTN (hypertension) Elevated Sbp 160s, the patient denied headache, dizziness,  change of vision, chest pain/pressures, palpitation, nausea, vomiting, or focal weakness, continue Metoprolol and Lisinopril. Observe.     Family/ staff Communication: plan of care reviewed with the patient and charge nurse.   Labs/tests ordered: X-ray pelvis, L hip, L femur, L knee, CBC/diff, CMP, TSH   Time spend 25 minutes.

## 2018-02-02 NOTE — Assessment & Plan Note (Signed)
Elevated Sbp 160s, the patient denied headache, dizziness, change of vision, chest pain/pressures, palpitation, nausea, vomiting, or focal weakness, continue Metoprolol and Lisinopril. Observe.

## 2018-02-05 ENCOUNTER — Encounter: Payer: Self-pay | Admitting: Nurse Practitioner

## 2018-02-06 DIAGNOSIS — G309 Alzheimer's disease, unspecified: Secondary | ICD-10-CM | POA: Diagnosis not present

## 2018-02-06 DIAGNOSIS — E039 Hypothyroidism, unspecified: Secondary | ICD-10-CM | POA: Diagnosis not present

## 2018-02-06 LAB — CBC AND DIFFERENTIAL
HCT: 36 (ref 36–46)
HEMOGLOBIN: 11.9 — AB (ref 12.0–16.0)
Platelets: 223 (ref 150–399)
WBC: 8.7

## 2018-02-06 LAB — HEPATIC FUNCTION PANEL
ALT: 7 (ref 7–35)
AST: 11 — AB (ref 13–35)
Alkaline Phosphatase: 76 (ref 25–125)
Bilirubin, Total: 0.4

## 2018-02-06 LAB — BASIC METABOLIC PANEL
BUN: 15 (ref 4–21)
Creatinine: 0.9 (ref <=1.1)
GLUCOSE: 95
POTASSIUM: 4.2 (ref 3.4–5.3)
SODIUM: 143 (ref 137–147)

## 2018-02-06 LAB — TSH: TSH: 1.57 (ref <=5.90)

## 2018-02-09 ENCOUNTER — Other Ambulatory Visit: Payer: Self-pay | Admitting: *Deleted

## 2018-02-09 LAB — COMPLETE METABOLIC PANEL WITH GFR
ALBUMIN: 3.7
CHLORIDE: 110
Calcium: 9.8
Carbon Dioxide, Total: 25
EGFR (Non-African Amer.): 59
Globulin: 2.5
PROTEIN: 6.2

## 2018-03-21 ENCOUNTER — Encounter: Payer: Self-pay | Admitting: Internal Medicine

## 2018-04-19 ENCOUNTER — Non-Acute Institutional Stay: Payer: Medicare Other | Admitting: Nurse Practitioner

## 2018-04-19 ENCOUNTER — Encounter: Payer: Self-pay | Admitting: Nurse Practitioner

## 2018-04-19 DIAGNOSIS — F329 Major depressive disorder, single episode, unspecified: Secondary | ICD-10-CM | POA: Diagnosis not present

## 2018-04-19 DIAGNOSIS — F0280 Dementia in other diseases classified elsewhere without behavioral disturbance: Secondary | ICD-10-CM | POA: Diagnosis not present

## 2018-04-19 DIAGNOSIS — G301 Alzheimer's disease with late onset: Secondary | ICD-10-CM | POA: Diagnosis not present

## 2018-04-19 DIAGNOSIS — E039 Hypothyroidism, unspecified: Secondary | ICD-10-CM

## 2018-04-19 NOTE — Assessment & Plan Note (Signed)
Hx of hypothyroidism, continue Levothyroxine 41mcg qd, last TSH 1.57 02/06/18

## 2018-04-19 NOTE — Assessment & Plan Note (Signed)
Her mood is stable, continue Trazodone 25mg  qd, Duloxetine 30mg  qd.

## 2018-04-19 NOTE — Assessment & Plan Note (Signed)
chronic mildly elevated Sbp in 150s, she denied headache, dizziness, change of vision, chest pain/pressures, palpitation, SOB, nausea, or vomiting. Continue  Metoprolol 75mg  qd, Lisinopril 40mg  qd

## 2018-04-19 NOTE — Assessment & Plan Note (Signed)
She resides in [facility] for safety and care assistance, continue Memantine/Donepezil 28/10 mg daily for memory.

## 2018-04-19 NOTE — Progress Notes (Signed)
Location:  [facility] Room Number: 703 Place of Service:  ALF [phone]) Provider:  Giovanni Bath, Lennie Odor  NP  Blanchie Serve, MD  Patient Care Team: Blanchie Serve, MD as PCP - General (Internal Medicine) Angy Swearengin X, NP as Nurse Practitioner (Internal Medicine)  Extended Emergency Contact Information Primary Emergency Contact: [facility] Address: [address]          [place], [place] [address] [name] of [place] Phone: [phone] Relation: Son  Code Status:  DNR Goals of care: Advanced Directive information Advanced Directives 04/19/2018  Does Patient Have a Medical Advance Directive? Yes  Type of Advance Directive Out of facility DNR (pink MOST or yellow form)  Does patient want to make changes to medical advance directive? No - Patient declined  Copy of [place] in Chart? -  Pre-existing out of facility DNR order (yellow form or pink MOST form) Yellow form placed in chart (order not valid for inpatient use)     Chief Complaint  Patient presents with  . Medical Management of Chronic Issues    F/u- HTN, depression, Alzheimers,hypothyroidism,     HPI:  Pt is a 84 y.o. female seen today for medical management of chronic diseases.     The patient has history of HTN, chronic mildly elevated Sbp in 150s, she denied headache, dizziness, change of vision, chest pain/pressures, palpitation, SOB, nausea, or vomiting. On Metoprolol 75mg  qd, Lisinopril 40mg  qd. She resides in [place] for safety and care assistance, on Memantine/Donepezil 28/10 mg daily for memory. Her mood is stable on Trazodone 25mg  qd, Duloxetine 30mg  qd. Hx of hypothyroidism, on Levothyroxine 64mcg qd, last TSH 1.57 02/06/18   Past Medical History:  Diagnosis Date  . Alzheimer disease 10/27/2016  . Depression, recurrent ([place]) 10/27/2016  . HTN (hypertension) 10/27/2016  . Hypertension   . Hypertensive kidney disease with CKD (chronic kidney disease) stage V ([place])  11/02/2016   11/02/16 Na 136, K 4.7, Bun 30, creat 1.52, TSH 0.86, wbc 6.7, Hgb 11.3, plt 264  . Hypothyroidism 10/27/2016  . Osteoarthritis 10/27/2016  . Thyroid disease    Past Surgical History:  Procedure Laterality Date  . ABDOMINAL HYSTERECTOMY  1986  . KNEE ARTHROSCOPY  2015/2017   Dr. Gustavus Bryant  . SPINE SURGERY  2009, 2011   Dr.Mark Nicoletta Dress    No Known Allergies  Outpatient Encounter Medications as of 04/19/2018  Medication Sig  . acetaminophen (TYLENOL) 325 MG tablet Take 650 mg by mouth every 8 (eight) hours as needed for mild pain.   . cholecalciferol (VITAMIN D) 1000 units tablet Take 1,000 Units by mouth daily.  . DULoxetine (CYMBALTA) 30 MG capsule Take 30 mg by mouth at bedtime.   Marland Kitchen levothyroxine (SYNTHROID, LEVOTHROID) 25 MCG tablet Take 25 mcg by mouth daily before breakfast.  . lisinopril (PRINIVIL,ZESTRIL) 40 MG tablet Take 40 mg by mouth daily.   . Memantine HCl-Donepezil HCl (NAMZARIC) 28-10 MG CP24 Take 1 tablet by mouth at bedtime.   . metoprolol tartrate (LOPRESSOR) 100 MG tablet TAKE 100 MG DAILY IN THE EVENING HOLD FOR SBP<110 AND OR HR<60 /MIN  . Metoprolol Tartrate 75 MG TABS METOPROLOL TARTRATE 75 MG BY DAILY HOLD FOR SBP <110 AND OR HR <60/MIN.  . traZODone (DESYREL) 50 MG tablet Take 25 mg by mouth at bedtime.   . vitamin B-12 (CYANOCOBALAMIN) 1000 MCG tablet Take 1,000 mcg by mouth daily.  . vitamin C (ASCORBIC ACID) 500 MG tablet Take 500 mg by mouth daily.  No facility-administered encounter medications on file as of 04/19/2018.    ROS was provided with assistance of staff Review of Systems  Constitutional: Negative for activity change, appetite change, chills, diaphoresis, fatigue, fever and unexpected weight change.       No weight changes in the past 3 months.   HENT: Positive for hearing loss. Negative for voice change.   Respiratory: Negative for cough, shortness of breath and wheezing.   Cardiovascular: Positive for leg swelling. Negative for chest  pain.  Gastrointestinal: Negative for abdominal distention, constipation, diarrhea, nausea and vomiting.  Genitourinary: Negative for difficulty urinating.  Musculoskeletal: Positive for arthralgias and gait problem. Negative for joint swelling.  Skin: Negative for color change and pallor.  Neurological: Negative for dizziness, weakness and headaches.  Psychiatric/Behavioral: Positive for confusion. Negative for agitation, behavioral problems, hallucinations and sleep disturbance. The patient is not nervous/anxious.     Immunization History  Administered Date(s) Administered  . Influenza-Unspecified 05/29/2017  . Pneumococcal Conjugate-13 06/21/2017  . Tdap 06/21/2017   Pertinent  Health Maintenance Due  Topic Date Due  . INFLUENZA VACCINE  03/08/2018  . PNA vac Low Risk Adult (2 of 2 - PPSV23) 06/21/2018  . DEXA SCAN  Completed   Fall Risk  04/25/2017  Falls in the past year? No   Functional Status Survey:    Vitals:   04/19/18 1145  BP: (!) 150/80  Pulse: 80  Resp: 18  Temp: 98 F (36.7 C)  SpO2: 93%  Weight: 157 lb (71.2 kg)  Height: 4\' 11"  (1.499 m)   Body mass index is 31.71 kg/m. Physical Exam  Constitutional: She appears well-developed and well-nourished.  HENT:  Head: Normocephalic and atraumatic.  Eyes: Pupils are equal, round, and reactive to light.  Neck: Normal range of motion. Neck supple. No JVD present. No thyromegaly present.  Cardiovascular: Normal rate and regular rhythm.  No murmur heard. Pulmonary/Chest: Effort normal. She has no wheezes.  Abdominal: Soft. Bowel sounds are normal. She exhibits no distension. There is no rebound and no guarding.  Musculoskeletal: She exhibits edema. She exhibits no tenderness.  Trace edema. Unsteady gait.   Neurological: She is alert. No cranial nerve deficit. She exhibits normal muscle tone. Coordination normal.  Oriented to person and place.   Skin: Skin is warm and dry.  Psychiatric: She has a normal mood  and affect. Her behavior is normal.    Labs reviewed: Recent Labs    10/10/17 02/06/18  NA 138 143  K 4.3 4.2  CL 106 110  CO2 27 25  BUN 19 15  CREATININE 1.0 0.9  CALCIUM 10.6 9.8   Recent Labs    10/10/17 02/06/18  AST 11* 11*  ALT 7 7  ALKPHOS 74 76  PROT 7.0  --   ALBUMIN 4.1 3.7   Recent Labs    10/10/17 02/06/18  WBC 7.1 8.7  HGB 13.7 11.9*  HCT 40 36  PLT 241 223   Lab Results  Component Value Date   TSH 1.57 02/06/2018   Lab Results  Component Value Date   HGBA1C 5.3 11/01/2016   No results found for: CHOL, HDL, LDLCALC, LDLDIRECT, TRIG, CHOLHDL  Significant Diagnostic Results in last 30 days:  No results found.  Assessment/Plan HTN (hypertension) chronic mildly elevated Sbp in 150s, she denied headache, dizziness, change of vision, chest pain/pressures, palpitation, SOB, nausea, or vomiting. Continue  Metoprolol 75mg  qd, Lisinopril 40mg  qd  Alzheimer disease She resides in [facility] for safety and care assistance,  continue Memantine/Donepezil 28/10 mg daily for memory.  Major depression, chronic Her mood is stable, continue Trazodone 25mg  qd, Duloxetine 30mg  qd.   Hypothyroidism Hx of hypothyroidism, continue Levothyroxine 56mcg qd, last TSH 1.57 02/06/18      Family/ staff Communication: plan of care reviewed with the patient and charge nurse.   Labs/tests ordered:  none  Time spend 25 minutes.

## 2018-04-27 ENCOUNTER — Non-Acute Institutional Stay: Payer: Medicare Other | Admitting: Nurse Practitioner

## 2018-04-27 ENCOUNTER — Encounter: Payer: Self-pay | Admitting: Nurse Practitioner

## 2018-04-27 ENCOUNTER — Non-Acute Institutional Stay: Payer: Medicare Other

## 2018-04-27 DIAGNOSIS — G301 Alzheimer's disease with late onset: Secondary | ICD-10-CM

## 2018-04-27 DIAGNOSIS — I10 Essential (primary) hypertension: Secondary | ICD-10-CM | POA: Diagnosis not present

## 2018-04-27 DIAGNOSIS — F0280 Dementia in other diseases classified elsewhere without behavioral disturbance: Secondary | ICD-10-CM | POA: Diagnosis not present

## 2018-04-27 DIAGNOSIS — Z0000 Encounter for general adult medical examination without abnormal findings: Secondary | ICD-10-CM

## 2018-04-27 DIAGNOSIS — R32 Unspecified urinary incontinence: Secondary | ICD-10-CM

## 2018-04-27 NOTE — Progress Notes (Signed)
Location:  [facility] Room Number: 510 Place of Service:  ALF [phone]) Provider:  Fredrico Beedle, Lennie Odor  NP  Blanchie Serve, MD  Patient Care Team: Blanchie Serve, MD as PCP - General (Internal Medicine) Danta Baumgardner X, NP as Nurse Practitioner (Internal Medicine)  Extended Emergency Contact Information Primary Emergency Contact: [facility] Address: [address]          [place], [place] [address] [name] of [place] Phone: [phone] Relation: Son  Code Status:  DNR Goals of care: Advanced Directive information Advanced Directives 04/27/2018  Does Patient Have a Medical Advance Directive? Yes  Type of Advance Directive Out of facility DNR (pink MOST or yellow form)  Does patient want to make changes to medical advance directive? No - Patient declined  Copy of [place] in Chart? -  Pre-existing out of facility DNR order (yellow form or pink MOST form) Yellow form placed in chart (order not valid for inpatient use)     Chief Complaint  Patient presents with  . Acute Visit    Urinary urgency    HPI:  Pt is a 84 y.o. female seen today for an acute visit for reported urinary frequency and urgency 04/26/18, duration and onset are uncertain. Reported an episode of urinary leakage per daughter. The patient denied dysuria, urinary frequency, urinary urgency, blood in urine, abd pain, nausea, vomiting, constipation, or lower back pain. HPI was provided with assistance of staff due to her dementia. The patient had an episode of exit seeking, able to be redirected. She is on Memantine/Donepezil for memory. Hx of HTN, elevated Sbp 150-160s, on Lisinopril 40mg  qd, Metoprolol 100mg  qd.    Past Medical History:  Diagnosis Date  . Alzheimer disease 10/27/2016  . Depression, recurrent ([place]) 10/27/2016  . HTN (hypertension) 10/27/2016  . Hypertension   . Hypertensive kidney disease with CKD (chronic kidney disease) stage V ([place]) 11/02/2016   11/02/16 Na  136, K 4.7, Bun 30, creat 1.52, TSH 0.86, wbc 6.7, Hgb 11.3, plt 264  . Hypothyroidism 10/27/2016  . Osteoarthritis 10/27/2016  . Thyroid disease    Past Surgical History:  Procedure Laterality Date  . ABDOMINAL HYSTERECTOMY  1986  . KNEE ARTHROSCOPY  2015/2017   Dr. Gustavus Bryant  . SPINE SURGERY  2009, 2011   Dr.Mark Nicoletta Dress    No Known Allergies  Outpatient Encounter Medications as of 04/27/2018  Medication Sig  . acetaminophen (TYLENOL) 325 MG tablet Take 650 mg by mouth every 8 (eight) hours as needed for mild pain.   . cholecalciferol (VITAMIN D) 1000 units tablet Take 1,000 Units by mouth daily.  . DULoxetine (CYMBALTA) 30 MG capsule Take 30 mg by mouth daily.   Marland Kitchen levothyroxine (SYNTHROID, LEVOTHROID) 25 MCG tablet Take 25 mcg by mouth daily before breakfast.  . lisinopril (PRINIVIL,ZESTRIL) 40 MG tablet Take 40 mg by mouth daily.   . Memantine HCl-Donepezil HCl (NAMZARIC) 28-10 MG CP24 Take 1 tablet by mouth at bedtime.   . metoprolol tartrate (LOPRESSOR) 100 MG tablet TAKE 100 MG DAILY IN THE EVENING HOLD FOR SBP<110 AND OR HR<60 /MIN  . Metoprolol Tartrate 75 MG TABS METOPROLOL TARTRATE 75 MG BY DAILY HOLD FOR SBP <110 AND OR HR <60/MIN.  . traZODone (DESYREL) 50 MG tablet Take 25 mg by mouth at bedtime. Take 1/2 tab of 50 mg.  . vitamin B-12 (CYANOCOBALAMIN) 1000 MCG tablet Take 1,000 mcg by mouth daily.  . vitamin C (ASCORBIC ACID) 500 MG tablet Take 500 mg  by mouth daily.   No facility-administered encounter medications on file as of 04/27/2018.    ROS was provided with assistance of staff Review of Systems  Constitutional: Negative for activity change, appetite change, chills, diaphoresis, fatigue, fever and unexpected weight change.  HENT: Positive for hearing loss. Negative for congestion and voice change.   Respiratory: Negative for cough, shortness of breath and wheezing.   Cardiovascular: Positive for leg swelling. Negative for chest pain and palpitations.  Gastrointestinal:  Negative for abdominal distention, abdominal pain, constipation, diarrhea, nausea and vomiting.  Genitourinary: Negative for difficulty urinating, dysuria, flank pain, frequency, hematuria and urgency.  Musculoskeletal: Positive for arthralgias and gait problem.  Skin: Negative for color change and pallor.  Neurological: Negative for speech difficulty, weakness and headaches.       Memory lapses.   Psychiatric/Behavioral: Positive for confusion. Negative for agitation, behavioral problems, hallucinations and sleep disturbance. The patient is not nervous/anxious.     Immunization History  Administered Date(s) Administered  . Influenza-Unspecified 05/29/2017  . Pneumococcal Conjugate-13 06/21/2017  . Tdap 06/21/2017   Pertinent  Health Maintenance Due  Topic Date Due  . INFLUENZA VACCINE  03/08/2018  . PNA vac Low Risk Adult (2 of 2 - PPSV23) 06/21/2018  . DEXA SCAN  Completed   Fall Risk  04/27/2018 04/25/2017  Falls in the past year? No No   Functional Status Survey:    Vitals:   04/27/18 1023  BP: (!) 160/80  Pulse: 64  Resp: 20  Temp: 98.7 F (37.1 C)  SpO2: 96%  Weight: 157 lb (71.2 kg)  Height: 4\' 11"  (1.499 m)   Body mass index is 31.71 kg/m. Physical Exam  Constitutional: She appears well-developed and well-nourished.  HENT:  Head: Normocephalic and atraumatic.  Eyes: Pupils are equal, round, and reactive to light. EOM are normal.  Neck: Normal range of motion. Neck supple. No JVD present. No thyromegaly present.  Cardiovascular: Normal rate and regular rhythm.  No murmur heard. Pulmonary/Chest: Effort normal. She has no wheezes. She has no rales.  Abdominal: Soft. Bowel sounds are normal. She exhibits no distension. There is no tenderness. There is no rebound and no guarding.  Musculoskeletal: She exhibits edema.  Self transfer, ambulates with walker, trace edema BLE has no change.   Neurological: She is alert. No cranial nerve deficit. She exhibits normal  muscle tone. Coordination normal.  Oriented to person and place.   Skin: Skin is warm and dry.  Psychiatric: She has a normal mood and affect. Her behavior is normal.    Labs reviewed: Recent Labs    10/10/17 02/06/18  NA 138 143  K 4.3 4.2  CL 106 110  CO2 27 25  BUN 19 15  CREATININE 1.0 0.9  CALCIUM 10.6 9.8   Recent Labs    10/10/17 02/06/18  AST 11* 11*  ALT 7 7  ALKPHOS 74 76  PROT 7.0  --   ALBUMIN 4.1 3.7   Recent Labs    10/10/17 02/06/18  WBC 7.1 8.7  HGB 13.7 11.9*  HCT 40 36  PLT 241 223   Lab Results  Component Value Date   TSH 1.57 02/06/2018   Lab Results  Component Value Date   HGBA1C 5.3 11/01/2016   No results found for: CHOL, HDL, LDLCALC, LDLDIRECT, TRIG, CHOLHDL  Significant Diagnostic Results in last 30 days:  No results found.  Assessment/Plan Incontinent of urine reported urinary frequency and urgency 04/26/18, duration and onset are uncertain. Reported an episode of  urinary leakage per daughter. The patient denied dysuria, urinary frequency, urinary urgency, blood in urine, abd pain, nausea, vomiting, constipation, or lower back pain  Will monitor for s/s of UTI(dysuria, urinary urgency, urinary incontinency, urinary frequency, fever, nausea, vomiting, abd/lower back pain), update CBC/diff, VS q shift x 72 hours.   Alzheimer disease HPI was provided with assistance of staff due to her dementia. The patient had an episode of exit seeking, able to be redirected. continue Memantine/Donepezil for memory. Close supervision for safety and care assistance.   HTN (hypertension) Hx of HTN, elevated Sbp 150-160s, continue Lisinopril 40mg  qd, Metoprolol 100mg  qd.       Family/ staff Communication:  Plan of care reviewed with the patient and charge nurse.   Labs/tests ordered:  CBC/diff  Time spend 25 minutes.

## 2018-04-27 NOTE — Assessment & Plan Note (Signed)
reported urinary frequency and urgency 04/26/18, duration and onset are uncertain. Reported an episode of urinary leakage per daughter. The patient denied dysuria, urinary frequency, urinary urgency, blood in urine, abd pain, nausea, vomiting, constipation, or lower back pain  Will monitor for s/s of UTI(dysuria, urinary urgency, urinary incontinency, urinary frequency, fever, nausea, vomiting, abd/lower back pain), update CBC/diff, VS q shift x 72 hours.

## 2018-04-27 NOTE — Assessment & Plan Note (Signed)
Hx of HTN, elevated Sbp 150-160s, continue Lisinopril 40mg  qd, Metoprolol 100mg  qd.

## 2018-04-27 NOTE — Assessment & Plan Note (Signed)
HPI was provided with assistance of staff due to her dementia. The patient had an episode of exit seeking, able to be redirected. continue Memantine/Donepezil for memory. Close supervision for safety and care assistance.

## 2018-04-27 NOTE — Patient Instructions (Signed)
Haley Cohen , Thank you for taking time to come for your Medicare Wellness Visit. I appreciate your ongoing commitment to your health goals. Please review the following plan we discussed and let me know if I can assist you in the future.   Screening recommendations/referrals: Colonoscopy excluded, over age 82 Mammogram excluded, over age 45 Bone Density up to date Recommended yearly ophthalmology/optometry visit for glaucoma screening and checkup Recommended yearly dental visit for hygiene and checkup  Vaccinations: Influenza vaccine due, will receive at [facility] Pneumococcal vaccine up to date, completed Tdap vaccine up to date, due 06/22/2027 Shingles vaccine not in past records    Advanced directives: in chart  Conditions/risks identified: none  Next appointment: Dr. Bubba Camp makes rounds   Preventive Care 21 Years and Older, Female Preventive care refers to lifestyle choices and visits with your health care provider that can promote health and wellness. What does preventive care include?  A yearly physical exam. This is also called an annual well check.  Dental exams once or twice a year.  Routine eye exams. Ask your health care provider how often you should have your eyes checked.  Personal lifestyle choices, including:  Daily care of your teeth and gums.  Regular physical activity.  Eating a healthy diet.  Avoiding tobacco and drug use.  Limiting alcohol use.  Practicing safe sex.  Taking low-dose aspirin every day.  Taking vitamin and mineral supplements as recommended by your health care provider. What happens during an annual well check? The services and screenings done by your health care provider during your annual well check will depend on your age, overall health, lifestyle risk factors, and family history of disease. Counseling  Your health care provider may ask you questions about your:  Alcohol use.  Tobacco use.  Drug use.  Emotional  well-being.  Home and relationship well-being.  Sexual activity.  Eating habits.  History of falls.  Memory and ability to understand (cognition).  Work and work Statistician.  Reproductive health. Screening  You may have the following tests or measurements:  Height, weight, and BMI.  Blood pressure.  Lipid and cholesterol levels. These may be checked every 5 years, or more frequently if you are over 64 years old.  Skin check.  Lung cancer screening. You may have this screening every year starting at age 53 if you have a 30-pack-year history of smoking and currently smoke or have quit within the past 15 years.  Fecal occult blood test (FOBT) of the stool. You may have this test every year starting at age 57.  Flexible sigmoidoscopy or colonoscopy. You may have a sigmoidoscopy every 5 years or a colonoscopy every 10 years starting at age 57.  Hepatitis C blood test.  Hepatitis B blood test.  Sexually transmitted disease (STD) testing.  Diabetes screening. This is done by checking your blood sugar (glucose) after you have not eaten for a while (fasting). You may have this done every 1-3 years.  Bone density scan. This is done to screen for osteoporosis. You may have this done starting at age 5.  Mammogram. This may be done every 1-2 years. Talk to your health care provider about how often you should have regular mammograms. Talk with your health care provider about your test results, treatment options, and if necessary, the need for more tests. Vaccines  Your health care provider may recommend certain vaccines, such as:  Influenza vaccine. This is recommended every year.  Tetanus, diphtheria, and acellular pertussis (Tdap, Td) vaccine.  You may need a Td booster every 10 years.  Zoster vaccine. You may need this after age 22.  Pneumococcal 13-valent conjugate (PCV13) vaccine. One dose is recommended after age 76.  Pneumococcal polysaccharide (PPSV23) vaccine. One  dose is recommended after age 69. Talk to your health care provider about which screenings and vaccines you need and how often you need them. This information is not intended to replace advice given to you by your health care provider. Make sure you discuss any questions you have with your health care provider. Document Released: 08/21/2015 Document Revised: 04/13/2016 Document Reviewed: 05/26/2015 Elsevier Interactive Patient Education  2017 [place] Prevention in the Home Falls can cause injuries. They can happen to people of all ages. There are many things you can do to make your home safe and to help prevent falls. What can I do on the outside of my home?  Regularly fix the edges of walkways and driveways and fix any cracks.  Remove anything that might make you trip as you walk through a door, such as a raised step or threshold.  Trim any bushes or trees on the path to your home.  Use bright outdoor lighting.  Clear any walking paths of anything that might make someone trip, such as rocks or tools.  Regularly check to see if handrails are loose or broken. Make sure that both sides of any steps have handrails.  Any raised decks and porches should have guardrails on the edges.  Have any leaves, snow, or ice cleared regularly.  Use sand or salt on walking paths during winter.  Clean up any spills in your garage right away. This includes oil or grease spills. What can I do in the bathroom?  Use night lights.  Install grab bars by the toilet and in the tub and shower. Do not use towel bars as grab bars.  Use non-skid mats or decals in the tub or shower.  If you need to sit down in the shower, use a plastic, non-slip stool.  Keep the floor dry. Clean up any water that spills on the floor as soon as it happens.  Remove soap buildup in the tub or shower regularly.  Attach bath mats securely with double-sided non-slip rug tape.  Do not have throw rugs and other  things on the floor that can make you trip. What can I do in the bedroom?  Use night lights.  Make sure that you have a light by your bed that is easy to reach.  Do not use any sheets or blankets that are too big for your bed. They should not hang down onto the floor.  Have a firm chair that has side arms. You can use this for support while you get dressed.  Do not have throw rugs and other things on the floor that can make you trip. What can I do in the kitchen?  Clean up any spills right away.  Avoid walking on wet floors.  Keep items that you use a lot in easy-to-reach places.  If you need to reach something above you, use a strong step stool that has a grab bar.  Keep electrical cords out of the way.  Do not use floor polish or wax that makes floors slippery. If you must use wax, use non-skid floor wax.  Do not have throw rugs and other things on the floor that can make you trip. What can I do with my stairs?  Do not leave any  items on the stairs.  Make sure that there are handrails on both sides of the stairs and use them. Fix handrails that are broken or loose. Make sure that handrails are as long as the stairways.  Check any carpeting to make sure that it is firmly attached to the stairs. Fix any carpet that is loose or worn.  Avoid having throw rugs at the top or bottom of the stairs. If you do have throw rugs, attach them to the floor with carpet tape.  Make sure that you have a light switch at the top of the stairs and the bottom of the stairs. If you do not have them, ask someone to add them for you. What else can I do to help prevent falls?  Wear shoes that:  Do not have high heels.  Have rubber bottoms.  Are comfortable and fit you well.  Are closed at the toe. Do not wear sandals.  If you use a stepladder:  Make sure that it is fully opened. Do not climb a closed stepladder.  Make sure that both sides of the stepladder are locked into place.  Ask  someone to hold it for you, if possible.  Clearly mark and make sure that you can see:  Any grab bars or handrails.  First and last steps.  Where the edge of each step is.  Use tools that help you move around (mobility aids) if they are needed. These include:  Canes.  Walkers.  Scooters.  Crutches.  Turn on the lights when you go into a dark area. Replace any light bulbs as soon as they burn out.  Set up your furniture so you have a clear path. Avoid moving your furniture around.  If any of your floors are uneven, fix them.  If there are any pets around you, be aware of where they are.  Review your medicines with your doctor. Some medicines can make you feel dizzy. This can increase your chance of falling. Ask your doctor what other things that you can do to help prevent falls. This information is not intended to replace advice given to you by your health care provider. Make sure you discuss any questions you have with your health care provider. Document Released: 05/21/2009 Document Revised: 12/31/2015 Document Reviewed: 08/29/2014 Elsevier Interactive Patient Education  2017 [employer].

## 2018-04-27 NOTE — Progress Notes (Signed)
Subjective:   Haley Cohen is a 84 y.o. female who presents for Medicare Annual (Subsequent) preventive examination at [place] [facility]-04/25/2017    Objective:     Vitals: BP 140/62 (BP Location: Left Arm, Patient Position: Supine)   Pulse 71   Temp 97.9 F (36.6 C) (Oral)   Ht 4\' 11"  (1.499 m)   Wt 157 lb (71.2 kg)   BMI 31.71 kg/m   Body mass index is 31.71 kg/m.  Advanced Directives 04/27/2018 04/19/2018 01/17/2018 10/17/2017 10/05/2017 07/21/2017 06/26/2017  Does Patient Have a Medical Advance Directive? Yes Yes Yes Yes Yes No Yes  Type of Advance Directive Out of facility DNR (pink MOST or yellow form) Out of facility DNR (pink MOST or yellow form) [place];Out of facility DNR (pink MOST or yellow form);Living will Living will;Out of facility DNR (pink MOST or yellow form) [place];Out of facility DNR (pink MOST or yellow form) Out of facility DNR (pink MOST or yellow form);[place];Living will Out of facility DNR (pink MOST or yellow form);Healthcare Power of Attorney  Does patient want to make changes to medical advance directive? No - Patient declined No - Patient declined No - Patient declined No - Patient declined No - Patient declined No - Patient declined No - Patient declined  Copy of [place] in Chart? - - Yes - Yes Yes Yes  Pre-existing out of facility DNR order (yellow form or pink MOST form) Yellow form placed in chart (order not valid for inpatient use) Yellow form placed in chart (order not valid for inpatient use) Yellow form placed in chart (order not valid for inpatient use) Yellow form placed in chart (order not valid for inpatient use) Yellow form placed in chart (order not valid for inpatient use) Yellow form placed in chart (order not valid for inpatient use) Yellow form placed in chart (order not valid for inpatient use);Pink MOST form placed in chart (order not  valid for inpatient use)    Tobacco Social History   Tobacco Use  Smoking Status Never Smoker  Smokeless Tobacco Never Used     Counseling given: Not Answered   Clinical Intake:  Pre-visit preparation completed: No  Pain : No/denies pain     Nutritional Risks: Other (Comment) Diabetes: No  What is the last grade level you completed in school?: High School  Interpreter Needed?: No  Information entered by :: Tyson Dense, RN  Past Medical History:  Diagnosis Date  . Alzheimer disease 10/27/2016  . Depression, recurrent ([place]) 10/27/2016  . HTN (hypertension) 10/27/2016  . Hypertension   . Hypertensive kidney disease with CKD (chronic kidney disease) stage V ([place]) 11/02/2016   11/02/16 Na 136, K 4.7, Bun 30, creat 1.52, TSH 0.86, wbc 6.7, Hgb 11.3, plt 264  . Hypothyroidism 10/27/2016  . Osteoarthritis 10/27/2016  . Thyroid disease    Past Surgical History:  Procedure Laterality Date  . ABDOMINAL HYSTERECTOMY  1986  . KNEE ARTHROSCOPY  2015/2017   Dr. Gustavus Bryant  . SPINE SURGERY  2009, 2011   Dr.Mark Nicoletta Dress   Family History  Problem Relation Age of Onset  . Cancer Mother    Social History   Socioeconomic History  . Marital status: Widowed    Spouse name: Not on file  . Number of children: Not on file  . Years of education: Not on file  . Highest education level: Not on file  Occupational History  .  Not on file  Social Needs  . Financial resource strain: Not hard at all  . Food insecurity:    Worry: Never true    Inability: Never true  . Transportation needs:    Medical: No    Non-medical: No  Tobacco Use  . Smoking status: Never Smoker  . Smokeless tobacco: Never Used  Substance and Sexual Activity  . Alcohol use: No  . Drug use: No  . Sexual activity: Never  Lifestyle  . Physical activity:    Days per week: 0 days    Minutes per session: 0 min  . Stress: Not at all  Relationships  . Social connections:    Talks on phone: More than three times  a week    Gets together: More than three times a week    Attends religious service: Never    Active member of club or organization: No    Attends meetings of clubs or organizations: Never    Relationship status: Widowed  Other Topics Concern  . Not on file  Social History Narrative   Admitted to [facility] 10/21/16   Widowed   Never smoked   Alcohol none   POA/DNR       Outpatient Encounter Medications as of 04/27/2018  Medication Sig  . acetaminophen (TYLENOL) 325 MG tablet Take 650 mg by mouth every 8 (eight) hours as needed for mild pain.   . cholecalciferol (VITAMIN D) 1000 units tablet Take 1,000 Units by mouth daily.  . DULoxetine (CYMBALTA) 30 MG capsule Take 30 mg by mouth daily.   Marland Kitchen levothyroxine (SYNTHROID, LEVOTHROID) 25 MCG tablet Take 25 mcg by mouth daily before breakfast.  . lisinopril (PRINIVIL,ZESTRIL) 40 MG tablet Take 40 mg by mouth daily.   . Memantine HCl-Donepezil HCl (NAMZARIC) 28-10 MG CP24 Take 1 tablet by mouth at bedtime.   . metoprolol tartrate (LOPRESSOR) 100 MG tablet TAKE 100 MG DAILY IN THE EVENING HOLD FOR SBP<110 AND OR HR<60 /MIN  . Metoprolol Tartrate 75 MG TABS METOPROLOL TARTRATE 75 MG BY DAILY HOLD FOR SBP <110 AND OR HR <60/MIN.  . traZODone (DESYREL) 50 MG tablet Take 25 mg by mouth at bedtime. Take 1/2 tab of 50 mg.  . vitamin B-12 (CYANOCOBALAMIN) 1000 MCG tablet Take 1,000 mcg by mouth daily.  . vitamin C (ASCORBIC ACID) 500 MG tablet Take 500 mg by mouth daily.   No facility-administered encounter medications on file as of 04/27/2018.     Activities of Daily Living In your present state of health, do you have any difficulty performing the following activities: 04/27/2018  Hearing? N  Vision? N  Difficulty concentrating or making decisions? Y  Walking or climbing stairs? Y  Dressing or bathing? N  Doing errands, shopping? Y  Preparing Food and eating ? Y  Using the Toilet? N  In the past six months, have you accidently  leaked urine? N  Do you have problems with loss of bowel control? N  Managing your Medications? Y  Managing your Finances? Y  Housekeeping or managing your Housekeeping? Y  Some recent data might be hidden    Patient Care Team: Blanchie Serve, MD as PCP - General (Internal Medicine) Mast, Man X, NP as Nurse Practitioner (Internal Medicine)    Assessment:   This is a routine wellness examination for [name].  Exercise Activities and Dietary recommendations Current Exercise Habits: The patient does not participate in regular exercise at present, Exercise limited by: neurologic condition(s)  Goals    .  Maintain LIfestyle     Patient will maintain her lifestyle.       Fall Risk Fall Risk  04/27/2018 04/25/2017  Falls in the past year? No No   Is the patient's home free of loose throw rugs in walkways, pet beds, electrical cords, etc?   yes      Grab bars in the bathroom? yes      Handrails on the stairs?   yes      Adequate lighting?   yes  Depression Screen PHQ 2/9 Scores 04/27/2018 04/25/2017  PHQ - 2 Score 0 0     Cognitive Function completed within last year [facility] - [place] Exam 10/09/2017 04/25/2017  Orientation to time 4 4  Orientation to Place 2 3  Registration 3 3  Attention/ Calculation 5 5  Recall 0 0  Language- name 2 objects 2 2  Language- repeat 1 1  Language- follow 3 step command 3 3  Language- read & follow direction 1 1  Write a sentence 1 1  Copy design 0 1  Total score 22 24        Immunization History  Administered Date(s) Administered  . Influenza-Unspecified 05/29/2017  . Pneumococcal Conjugate-13 06/21/2017  . Tdap 06/21/2017    Qualifies for Shingles Vaccine? Not in past records  Screening Tests Health Maintenance  Topic Date Due  . INFLUENZA VACCINE  03/08/2018  . PNA vac Low Risk Adult (2 of 2 - PPSV23) 06/21/2018  . TETANUS/TDAP  06/22/2027  . DEXA SCAN  Completed    Cancer Screenings: Lung: Low Dose CT Chest  recommended if Age 75-80 years, 30 pack-year currently smoking OR have quit w/in 15years. Patient does not qualify. Breast:  Up to date on Mammogram? Yes   Up to date of Bone Density/Dexa? Yes Colorectal: up to date  Additional Screenings:  Hepatitis C Screening: declined Flu vaccine due: will receive at [facility]:    I have personally reviewed and addressed the Medicare Annual Wellness questionnaire and have noted the following in the patient's chart:  A. Medical and social history B. Use of alcohol, tobacco or illicit drugs  C. Current medications and supplements D. Functional ability and status E.  Nutritional status F.  Physical activity G. Advance directives H. List of other physicians I.  Hospitalizations, surgeries, and ER visits in previous 12 months J.  [place] to include hearing, vision, cognitive, depression L. Referrals and appointments - none  In addition, I have reviewed and discussed with patient certain preventive protocols, quality metrics, and best practice recommendations. A written personalized care plan for preventive services as well as general preventive health recommendations were provided to patient.  See attached scanned questionnaire for additional information.   Signed,   Tyson Dense, RN Nurse Health Advisor  Patient concerns: None

## 2018-04-30 ENCOUNTER — Encounter: Payer: Self-pay | Admitting: Nurse Practitioner

## 2018-05-01 DIAGNOSIS — R32 Unspecified urinary incontinence: Secondary | ICD-10-CM | POA: Diagnosis not present

## 2018-05-01 LAB — CBC AND DIFFERENTIAL
HEMATOCRIT: 41 (ref 36–46)
Hemoglobin: 13.7 (ref 12.0–16.0)
PLATELETS: 244 (ref 150–399)
WBC: 8.1

## 2018-05-02 ENCOUNTER — Other Ambulatory Visit: Payer: Self-pay | Admitting: *Deleted

## 2018-05-10 DIAGNOSIS — M6281 Muscle weakness (generalized): Secondary | ICD-10-CM | POA: Diagnosis not present

## 2018-05-10 DIAGNOSIS — Z741 Need for assistance with personal care: Secondary | ICD-10-CM | POA: Diagnosis not present

## 2018-05-10 DIAGNOSIS — E039 Hypothyroidism, unspecified: Secondary | ICD-10-CM | POA: Diagnosis not present

## 2018-05-10 DIAGNOSIS — I10 Essential (primary) hypertension: Secondary | ICD-10-CM | POA: Diagnosis not present

## 2018-05-10 DIAGNOSIS — R41841 Cognitive communication deficit: Secondary | ICD-10-CM | POA: Diagnosis not present

## 2018-05-10 DIAGNOSIS — M1990 Unspecified osteoarthritis, unspecified site: Secondary | ICD-10-CM | POA: Diagnosis not present

## 2018-05-10 DIAGNOSIS — R4701 Aphasia: Secondary | ICD-10-CM | POA: Diagnosis not present

## 2018-05-14 DIAGNOSIS — R41841 Cognitive communication deficit: Secondary | ICD-10-CM | POA: Diagnosis not present

## 2018-05-14 DIAGNOSIS — R4701 Aphasia: Secondary | ICD-10-CM | POA: Diagnosis not present

## 2018-05-14 DIAGNOSIS — M6281 Muscle weakness (generalized): Secondary | ICD-10-CM | POA: Diagnosis not present

## 2018-05-14 DIAGNOSIS — Z741 Need for assistance with personal care: Secondary | ICD-10-CM | POA: Diagnosis not present

## 2018-05-14 DIAGNOSIS — I10 Essential (primary) hypertension: Secondary | ICD-10-CM | POA: Diagnosis not present

## 2018-05-14 DIAGNOSIS — E039 Hypothyroidism, unspecified: Secondary | ICD-10-CM | POA: Diagnosis not present

## 2018-05-16 DIAGNOSIS — M6281 Muscle weakness (generalized): Secondary | ICD-10-CM | POA: Diagnosis not present

## 2018-05-16 DIAGNOSIS — R41841 Cognitive communication deficit: Secondary | ICD-10-CM | POA: Diagnosis not present

## 2018-05-16 DIAGNOSIS — Z741 Need for assistance with personal care: Secondary | ICD-10-CM | POA: Diagnosis not present

## 2018-05-16 DIAGNOSIS — E039 Hypothyroidism, unspecified: Secondary | ICD-10-CM | POA: Diagnosis not present

## 2018-05-16 DIAGNOSIS — R4701 Aphasia: Secondary | ICD-10-CM | POA: Diagnosis not present

## 2018-05-16 DIAGNOSIS — I10 Essential (primary) hypertension: Secondary | ICD-10-CM | POA: Diagnosis not present

## 2018-05-21 DIAGNOSIS — M6281 Muscle weakness (generalized): Secondary | ICD-10-CM | POA: Diagnosis not present

## 2018-05-21 DIAGNOSIS — E039 Hypothyroidism, unspecified: Secondary | ICD-10-CM | POA: Diagnosis not present

## 2018-05-21 DIAGNOSIS — Z741 Need for assistance with personal care: Secondary | ICD-10-CM | POA: Diagnosis not present

## 2018-05-21 DIAGNOSIS — R4701 Aphasia: Secondary | ICD-10-CM | POA: Diagnosis not present

## 2018-05-21 DIAGNOSIS — R41841 Cognitive communication deficit: Secondary | ICD-10-CM | POA: Diagnosis not present

## 2018-05-21 DIAGNOSIS — I10 Essential (primary) hypertension: Secondary | ICD-10-CM | POA: Diagnosis not present

## 2018-06-15 DIAGNOSIS — G4700 Insomnia, unspecified: Secondary | ICD-10-CM | POA: Insufficient documentation

## 2018-07-03 DIAGNOSIS — I10 Essential (primary) hypertension: Secondary | ICD-10-CM | POA: Diagnosis not present

## 2018-07-03 DIAGNOSIS — N185 Chronic kidney disease, stage 5: Secondary | ICD-10-CM | POA: Diagnosis not present

## 2018-07-03 DIAGNOSIS — R296 Repeated falls: Secondary | ICD-10-CM | POA: Diagnosis not present

## 2018-07-03 DIAGNOSIS — I120 Hypertensive chronic kidney disease with stage 5 chronic kidney disease or end stage renal disease: Secondary | ICD-10-CM | POA: Diagnosis not present

## 2018-07-03 DIAGNOSIS — M1990 Unspecified osteoarthritis, unspecified site: Secondary | ICD-10-CM | POA: Diagnosis not present

## 2018-07-03 DIAGNOSIS — E039 Hypothyroidism, unspecified: Secondary | ICD-10-CM | POA: Diagnosis not present

## 2018-07-03 DIAGNOSIS — R2681 Unsteadiness on feet: Secondary | ICD-10-CM | POA: Diagnosis not present

## 2018-07-03 DIAGNOSIS — M6281 Muscle weakness (generalized): Secondary | ICD-10-CM | POA: Diagnosis not present

## 2018-07-04 DIAGNOSIS — R296 Repeated falls: Secondary | ICD-10-CM | POA: Diagnosis not present

## 2018-07-04 DIAGNOSIS — N185 Chronic kidney disease, stage 5: Secondary | ICD-10-CM | POA: Diagnosis not present

## 2018-07-04 DIAGNOSIS — M6281 Muscle weakness (generalized): Secondary | ICD-10-CM | POA: Diagnosis not present

## 2018-07-04 DIAGNOSIS — E039 Hypothyroidism, unspecified: Secondary | ICD-10-CM | POA: Diagnosis not present

## 2018-07-04 DIAGNOSIS — R2681 Unsteadiness on feet: Secondary | ICD-10-CM | POA: Diagnosis not present

## 2018-07-04 DIAGNOSIS — I120 Hypertensive chronic kidney disease with stage 5 chronic kidney disease or end stage renal disease: Secondary | ICD-10-CM | POA: Diagnosis not present

## 2018-07-06 DIAGNOSIS — I120 Hypertensive chronic kidney disease with stage 5 chronic kidney disease or end stage renal disease: Secondary | ICD-10-CM | POA: Diagnosis not present

## 2018-07-06 DIAGNOSIS — E039 Hypothyroidism, unspecified: Secondary | ICD-10-CM | POA: Diagnosis not present

## 2018-07-06 DIAGNOSIS — R296 Repeated falls: Secondary | ICD-10-CM | POA: Diagnosis not present

## 2018-07-06 DIAGNOSIS — M6281 Muscle weakness (generalized): Secondary | ICD-10-CM | POA: Diagnosis not present

## 2018-07-06 DIAGNOSIS — N185 Chronic kidney disease, stage 5: Secondary | ICD-10-CM | POA: Diagnosis not present

## 2018-07-06 DIAGNOSIS — R2681 Unsteadiness on feet: Secondary | ICD-10-CM | POA: Diagnosis not present

## 2018-07-10 DIAGNOSIS — M1990 Unspecified osteoarthritis, unspecified site: Secondary | ICD-10-CM | POA: Diagnosis not present

## 2018-07-10 DIAGNOSIS — R2681 Unsteadiness on feet: Secondary | ICD-10-CM | POA: Diagnosis not present

## 2018-07-10 DIAGNOSIS — M6281 Muscle weakness (generalized): Secondary | ICD-10-CM | POA: Diagnosis not present

## 2018-07-10 DIAGNOSIS — I10 Essential (primary) hypertension: Secondary | ICD-10-CM | POA: Diagnosis not present

## 2018-07-10 DIAGNOSIS — N185 Chronic kidney disease, stage 5: Secondary | ICD-10-CM | POA: Diagnosis not present

## 2018-07-10 DIAGNOSIS — I120 Hypertensive chronic kidney disease with stage 5 chronic kidney disease or end stage renal disease: Secondary | ICD-10-CM | POA: Diagnosis not present

## 2018-07-10 DIAGNOSIS — E039 Hypothyroidism, unspecified: Secondary | ICD-10-CM | POA: Diagnosis not present

## 2018-07-10 DIAGNOSIS — R296 Repeated falls: Secondary | ICD-10-CM | POA: Diagnosis not present

## 2018-07-11 ENCOUNTER — Non-Acute Institutional Stay: Payer: Medicare Other | Admitting: Nurse Practitioner

## 2018-07-11 ENCOUNTER — Encounter: Payer: Self-pay | Admitting: Nurse Practitioner

## 2018-07-11 DIAGNOSIS — R609 Edema, unspecified: Secondary | ICD-10-CM | POA: Insufficient documentation

## 2018-07-11 DIAGNOSIS — F0280 Dementia in other diseases classified elsewhere without behavioral disturbance: Secondary | ICD-10-CM

## 2018-07-11 DIAGNOSIS — E039 Hypothyroidism, unspecified: Secondary | ICD-10-CM | POA: Diagnosis not present

## 2018-07-11 DIAGNOSIS — G301 Alzheimer's disease with late onset: Secondary | ICD-10-CM | POA: Diagnosis not present

## 2018-07-11 DIAGNOSIS — I10 Essential (primary) hypertension: Secondary | ICD-10-CM | POA: Diagnosis not present

## 2018-07-11 DIAGNOSIS — N185 Chronic kidney disease, stage 5: Secondary | ICD-10-CM | POA: Diagnosis not present

## 2018-07-11 DIAGNOSIS — F5105 Insomnia due to other mental disorder: Secondary | ICD-10-CM | POA: Diagnosis not present

## 2018-07-11 DIAGNOSIS — R2681 Unsteadiness on feet: Secondary | ICD-10-CM | POA: Diagnosis not present

## 2018-07-11 DIAGNOSIS — I120 Hypertensive chronic kidney disease with stage 5 chronic kidney disease or end stage renal disease: Secondary | ICD-10-CM | POA: Diagnosis not present

## 2018-07-11 DIAGNOSIS — R296 Repeated falls: Secondary | ICD-10-CM | POA: Diagnosis not present

## 2018-07-11 DIAGNOSIS — M6281 Muscle weakness (generalized): Secondary | ICD-10-CM | POA: Diagnosis not present

## 2018-07-11 DIAGNOSIS — F329 Major depressive disorder, single episode, unspecified: Secondary | ICD-10-CM | POA: Diagnosis not present

## 2018-07-11 DIAGNOSIS — F99 Mental disorder, not otherwise specified: Secondary | ICD-10-CM

## 2018-07-11 NOTE — Progress Notes (Signed)
Location:  [place] Room Number: [place] of Service:  ALF [phone]) Provider:  Bertel Venard, Lennie Odor  NP  Wardell Honour, MD  Patient Care Team: Wardell Honour, MD as PCP - General (Family Medicine) Kylen Schliep X, NP as Nurse Practitioner (Internal Medicine)  Extended Emergency Contact Information Primary Emergency Contact: [facility] Address: [address]          [place], [place] [address] [name] of [place] Phone: [phone] Relation: Son  Code Status:  DNR Goals of care: Advanced Directive information Advanced Directives 07/11/2018  Does Patient Have a Medical Advance Directive? Yes  Type of Advance Directive Out of facility DNR (pink MOST or yellow form)  Does patient want to make changes to medical advance directive? No - Patient declined  Copy of [place] in Chart? -  Pre-existing out of facility DNR order (yellow form or pink MOST form) Yellow form placed in chart (order not valid for inpatient use)     Chief Complaint  Patient presents with  . Acute Visit    Exit seeking x 3-4    HPI:  Pt is a 84 y.o. female seen today for an acute visit for worsening in memory and confusion,  pacing back and forth at night, woke up in middle on the night, hungry and went to dining room and thought it was time for breakfast, exit seeking frequently. Hx of dementia, off Memantine/Donepezil 06/07/18 per POA's request. Hx of insomnia, Trazodone was increased to 50mg  qhs with little efficacy. Her mood is stable on Cymbalta 30mg  qd. Hx of HTN, blood pressure is in control, on Lisinopril 40mg  qd, Metoprolol 100mg  qd, 75mg  qd. Hx of hypothyroidism on Levothyroxine 17mcg qd, last TSH 1.57 02/06/18.    Past Medical History:  Diagnosis Date  . Alzheimer disease ([place]) 10/27/2016  . Depression, recurrent ([place]) 10/27/2016  . HTN (hypertension) 10/27/2016  . Hypertension   . Hypertensive kidney disease with CKD (chronic kidney disease) stage V  ([place]) 11/02/2016   11/02/16 Na 136, K 4.7, Bun 30, creat 1.52, TSH 0.86, wbc 6.7, Hgb 11.3, plt 264  . Hypothyroidism 10/27/2016  . Osteoarthritis 10/27/2016  . Thyroid disease    Past Surgical History:  Procedure Laterality Date  . ABDOMINAL HYSTERECTOMY  1986  . KNEE ARTHROSCOPY  2015/2017   Dr. Gustavus Bryant  . SPINE SURGERY  2009, 2011   Dr.Mark Nicoletta Dress    No Known Allergies  Outpatient Encounter Medications as of 07/11/2018  Medication Sig  . acetaminophen (TYLENOL) 325 MG tablet Take 650 mg by mouth every 8 (eight) hours as needed for mild pain.   . cholecalciferol (VITAMIN D) 1000 units tablet Take 1,000 Units by mouth daily.  . DULoxetine (CYMBALTA) 30 MG capsule Take 30 mg by mouth daily.   Marland Kitchen levothyroxine (SYNTHROID, LEVOTHROID) 25 MCG tablet Take 25 mcg by mouth daily before breakfast.  . lisinopril (PRINIVIL,ZESTRIL) 40 MG tablet Take 40 mg by mouth daily.   . metoprolol tartrate (LOPRESSOR) 100 MG tablet TAKE 100 MG AT BED TIME HOLD FOR SBP<110 AND OR HR<60 /MIN  . Metoprolol Tartrate 75 MG TABS METOPROLOL TARTRATE 75 MG BY DAILY HOLD FOR SBP <110 AND OR HR <60/MIN.  . traZODone (DESYREL) 25 mg TABS tablet Take 25 mg by mouth at bedtime.  . vitamin B-12 (CYANOCOBALAMIN) 1000 MCG tablet Take 1,000 mcg by mouth daily.  . vitamin C (ASCORBIC ACID) 500 MG tablet Take 500 mg by mouth daily.  . [DISCONTINUED] Memantine  HCl-Donepezil HCl (NAMZARIC) 28-10 MG CP24 Take 1 tablet by mouth at bedtime.   . [DISCONTINUED] traZODone (DESYREL) 50 MG tablet Take 25 mg by mouth at bedtime. Take 1/2 tab of 50 mg.   No facility-administered encounter medications on file as of 07/11/2018.    ROS was provided with assistance of staff Review of Systems  Constitutional: Negative for activity change, appetite change, chills, diaphoresis, fatigue and fever.  HENT: Positive for hearing loss. Negative for congestion and voice change.   Respiratory: Positive for shortness of breath. Negative for cough, chest  tightness and wheezing.        Occasionally DOE  Cardiovascular: Positive for leg swelling. Negative for chest pain and palpitations.  Gastrointestinal: Negative for abdominal distention, abdominal pain, constipation, diarrhea, nausea and vomiting.  Genitourinary: Negative for difficulty urinating, dysuria and urgency.       Incontinent of urine.   Musculoskeletal: Positive for arthralgias and gait problem.  Skin: Negative for color change and pallor.  Neurological: Negative for dizziness, facial asymmetry, speech difficulty and headaches.       Dementia  Psychiatric/Behavioral: Positive for behavioral problems, confusion and sleep disturbance. Negative for agitation and hallucinations. The patient is not nervous/anxious.     Immunization History  Administered Date(s) Administered  . Influenza-Unspecified 05/29/2017  . Pneumococcal Conjugate-13 06/21/2017  . Tdap 06/21/2017   Pertinent  Health Maintenance Due  Topic Date Due  . PNA vac Low Risk Adult (2 of 2 - PPSV23) 06/21/2018  . INFLUENZA VACCINE  05/13/2019 (Originally 03/08/2018)  . DEXA SCAN  Completed   Fall Risk  04/27/2018 04/25/2017  Falls in the past year? No No   Functional Status Survey:    Vitals:   07/11/18 1419  BP: (!) 155/90  Pulse: 68  Resp: 20  Temp: 98.9 F (37.2 C)  SpO2: 94%  Weight: 156 lb 6.4 oz (70.9 kg)  Height: 4\' 11"  (1.499 m)   Body mass index is 31.59 kg/m. Physical Exam  Constitutional: She appears well-developed and well-nourished.  HENT:  Head: Normocephalic and atraumatic.  Eyes: Pupils are equal, round, and reactive to light. EOM are normal.  Neck: Normal range of motion. Neck supple. No JVD present. No thyromegaly present.  Cardiovascular: Normal rate and regular rhythm.  No murmur heard. Pulmonary/Chest: Effort normal. She has no wheezes. She has no rales.  Abdominal: Soft. She exhibits no distension. There is no tenderness. There is no rebound and no guarding.  Musculoskeletal:  She exhibits edema.  1+ edema BLE  Neurological: She is alert. No cranial nerve deficit. She exhibits normal muscle tone. Coordination normal.  Oriented to person and her room on unit.   Skin: Skin is warm and dry.  Psychiatric: She has a normal mood and affect.    Labs reviewed: Recent Labs    10/10/17 02/06/18  NA 138 143  K 4.3 4.2  CL 106 110  CO2 27 25  BUN 19 15  CREATININE 1.0 0.9  CALCIUM 10.6 9.8   Recent Labs    10/10/17 02/06/18  AST 11* 11*  ALT 7 7  ALKPHOS 74 76  PROT 7.0  --   ALBUMIN 4.1 3.7   Recent Labs    10/10/17 02/06/18 05/01/18  WBC 7.1 8.7 8.1  HGB 13.7 11.9* 13.7  HCT 40 36 41  PLT 241 223 244   Lab Results  Component Value Date   TSH 1.57 02/06/2018   Lab Results  Component Value Date   HGBA1C 5.3 11/01/2016  No results found for: CHOL, HDL, LDLCALC, LDLDIRECT, TRIG, CHOLHDL  Significant Diagnostic Results in last 30 days:  No results found.  Assessment/Plan Alzheimer disease ([place]) Progressing, needs Memory Care Unit [facility] for safety and care assistance. Pending MMSE, update CBC CMP  Major depression, chronic Stable, continue Cymbalta 30mg  qd.   Insomnia Persisted, taper off Trazodone, no efficacy. The patient declined hypnotic meds today.   Hypothyroidism Last TSH wnl, 1.57 02/06/18, continue Levothyroxine 5mcg daily, update TSH  HTN (hypertension) Blood pressure is controlled, continue Lisinopril 40mg  qd, Metoprolol 100mg  qd, 75mg  qd.   Edema BLE, she stated its not new, sometime DOE, but denied cough, paroxysmal nocturnal orthopnea, sputum production, chest pain/pressure, or palpitation. Will weigh 2x/week x 4 weeks.      Family/ staff Communication: plan of care reviewed with the patient and charge nurse.   Labs/tests ordered: CBC CMP TSH  Time spend 25 minutes.

## 2018-07-11 NOTE — Assessment & Plan Note (Signed)
Persisted, taper off Trazodone, no efficacy. The patient declined hypnotic meds today.

## 2018-07-11 NOTE — Assessment & Plan Note (Addendum)
Progressing, needs Memory Care Unit [facility] for safety and care assistance. Pending MMSE, update CBC CMP

## 2018-07-11 NOTE — Assessment & Plan Note (Signed)
Blood pressure is controlled, continue Lisinopril 40mg  qd, Metoprolol 100mg  qd, 75mg  qd.

## 2018-07-11 NOTE — Assessment & Plan Note (Signed)
BLE, she stated its not new, sometime DOE, but denied cough, paroxysmal nocturnal orthopnea, sputum production, chest pain/pressure, or palpitation. Will weigh 2x/week x 4 weeks.

## 2018-07-11 NOTE — Assessment & Plan Note (Signed)
Last TSH wnl, 1.57 02/06/18, continue Levothyroxine 70mcg daily, update TSH

## 2018-07-11 NOTE — Assessment & Plan Note (Signed)
Stable, continue Cymbalta 30mg  qd.

## 2018-07-12 DIAGNOSIS — F0280 Dementia in other diseases classified elsewhere without behavioral disturbance: Secondary | ICD-10-CM | POA: Diagnosis not present

## 2018-07-12 DIAGNOSIS — G309 Alzheimer's disease, unspecified: Secondary | ICD-10-CM | POA: Diagnosis not present

## 2018-07-13 ENCOUNTER — Encounter: Payer: Self-pay | Admitting: Nurse Practitioner

## 2018-07-16 DIAGNOSIS — R2681 Unsteadiness on feet: Secondary | ICD-10-CM | POA: Diagnosis not present

## 2018-07-16 DIAGNOSIS — Z9181 History of falling: Secondary | ICD-10-CM | POA: Diagnosis not present

## 2018-07-16 DIAGNOSIS — M6281 Muscle weakness (generalized): Secondary | ICD-10-CM | POA: Diagnosis not present

## 2018-07-18 DIAGNOSIS — M6281 Muscle weakness (generalized): Secondary | ICD-10-CM | POA: Diagnosis not present

## 2018-07-18 DIAGNOSIS — Z9181 History of falling: Secondary | ICD-10-CM | POA: Diagnosis not present

## 2018-07-18 DIAGNOSIS — R2681 Unsteadiness on feet: Secondary | ICD-10-CM | POA: Diagnosis not present

## 2018-07-19 ENCOUNTER — Encounter: Payer: Self-pay | Admitting: Family Medicine

## 2018-07-19 ENCOUNTER — Non-Acute Institutional Stay (SKILLED_NURSING_FACILITY): Payer: Medicare Other | Admitting: Family Medicine

## 2018-07-19 DIAGNOSIS — F0280 Dementia in other diseases classified elsewhere without behavioral disturbance: Secondary | ICD-10-CM | POA: Diagnosis not present

## 2018-07-19 DIAGNOSIS — Z9181 History of falling: Secondary | ICD-10-CM | POA: Diagnosis not present

## 2018-07-19 DIAGNOSIS — E039 Hypothyroidism, unspecified: Secondary | ICD-10-CM

## 2018-07-19 DIAGNOSIS — I10 Essential (primary) hypertension: Secondary | ICD-10-CM | POA: Diagnosis not present

## 2018-07-19 DIAGNOSIS — G301 Alzheimer's disease with late onset: Secondary | ICD-10-CM | POA: Diagnosis not present

## 2018-07-19 DIAGNOSIS — R2681 Unsteadiness on feet: Secondary | ICD-10-CM | POA: Diagnosis not present

## 2018-07-19 DIAGNOSIS — M6281 Muscle weakness (generalized): Secondary | ICD-10-CM | POA: Diagnosis not present

## 2018-07-19 NOTE — Progress Notes (Signed)
Opened in error

## 2018-07-19 NOTE — Progress Notes (Signed)
Provider:  Alain Honey, MD Location:  [place] Room Number: 106 Place of Service:  SNF [phone])  PCP: Wardell Honour, MD Patient Care Team: Wardell Honour, MD as PCP - General (Family Medicine) Mast, Man X, NP as Nurse Practitioner (Internal Medicine)  Extended Emergency Contact Information Primary Emergency Contact: [facility] Address: [address]          [place], [place] [address] [name] of [place] Phone: [phone] Relation: Son  Code Status: DNR Goals of Care: Advanced Directive information Advanced Directives 07/19/2018  Does Patient Have a Medical Advance Directive? Yes  Type of Advance Directive Out of facility DNR (pink MOST or yellow form)  Does patient want to make changes to medical advance directive? No - Patient declined  Copy of [place] in Chart? -  Pre-existing out of facility DNR order (yellow form or pink MOST form) Yellow form placed in chart (order not valid for inpatient use)      Chief Complaint  Patient presents with  . New Admit To SNF    new admit to facility     HPI: Patient is a 84 y.o. female seen today for admission to [facility] memory care.  Patient had been living in assisted living but was exhibiting some elopement and exit seeking behavior, so she was moved to the memory care unit.  Cognitively.  She seems to be functioning at a higher level than most folks in memory unit.  She is very ambulatory with her walker.  There are obvious cognitive issues but she is still able to carry on conversation and relate some of her past history to me.  Overall she is fairly healthy.  She does have a history of depression, hypothyroidism, hypertension, and sleep disturbance.  Regarding the latter, she had been on trazodone that expired.  And I received word from staff that she needed something for sleep per her daughter.  Patient denies any issues with  insomnia.  Past Medical History:  Diagnosis Date  . Alzheimer disease ([place]) 10/27/2016  . Depression, recurrent ([place]) 10/27/2016  . HTN (hypertension) 10/27/2016  . Hypertension   . Hypertensive kidney disease with CKD (chronic kidney disease) stage V ([place]) 11/02/2016   11/02/16 Na 136, K 4.7, Bun 30, creat 1.52, TSH 0.86, wbc 6.7, Hgb 11.3, plt 264  . Hypothyroidism 10/27/2016  . Osteoarthritis 10/27/2016  . Thyroid disease    Past Surgical History:  Procedure Laterality Date  . ABDOMINAL HYSTERECTOMY  1986  . KNEE ARTHROSCOPY  2015/2017   Dr. Gustavus Bryant  . SPINE SURGERY  2009, 2011   Dr.Mark Nicoletta Dress    reports that she has never smoked. She has never used smokeless tobacco. She reports that she does not drink alcohol or use drugs. Social History   Socioeconomic History  . Marital status: Widowed    Spouse name: Not on file  . Number of children: Not on file  . Years of education: Not on file  . Highest education level: Not on file  Occupational History  . Not on file  Social Needs  . Financial resource strain: Not hard at all  . Food insecurity:    Worry: Never true    Inability: Never true  . Transportation needs:    Medical: No    Non-medical: No  Tobacco Use  . Smoking status: Never Smoker  . Smokeless tobacco: Never Used  Substance and Sexual Activity  . Alcohol use: No  .  Drug use: No  . Sexual activity: Never  Lifestyle  . Physical activity:    Days per week: 0 days    Minutes per session: 0 min  . Stress: Not at all  Relationships  . Social connections:    Talks on phone: More than three times a week    Gets together: More than three times a week    Attends religious service: Never    Active member of club or organization: No    Attends meetings of clubs or organizations: Never    Relationship status: Widowed  . Intimate partner violence:    Fear of current or ex partner: No    Emotionally abused: No    Physically abused: No    Forced sexual activity: No   Other Topics Concern  . Not on file  Social History Narrative   Admitted to [facility] 10/21/16   Widowed   Never smoked   Alcohol none   POA/DNR       Functional Status Survey:    Family History  Problem Relation Age of Onset  . Cancer Mother     Health Maintenance  Topic Date Due  . PNA vac Low Risk Adult (2 of 2 - PPSV23) 06/21/2018  . INFLUENZA VACCINE  05/13/2019 (Originally 03/08/2018)  . TETANUS/TDAP  06/22/2027  . DEXA SCAN  Completed    No Known Allergies  Outpatient Encounter Medications as of 07/19/2018  Medication Sig  . acetaminophen (TYLENOL) 325 MG tablet Take 650 mg by mouth every 8 (eight) hours as needed for mild pain.   . cholecalciferol (VITAMIN D) 1000 units tablet Take 1,000 Units by mouth daily.  . DULoxetine (CYMBALTA) 30 MG capsule Take 30 mg by mouth daily.   Marland Kitchen levothyroxine (SYNTHROID, LEVOTHROID) 25 MCG tablet Take 25 mcg by mouth daily before breakfast.  . lisinopril (PRINIVIL,ZESTRIL) 40 MG tablet Take 40 mg by mouth daily.   . metoprolol tartrate (LOPRESSOR) 100 MG tablet TAKE 100 MG AT BED TIME HOLD FOR SBP<110 AND OR HR<60 /MIN  . Metoprolol Tartrate 75 MG TABS METOPROLOL TARTRATE 75 MG BY DAILY HOLD FOR SBP <110 AND OR HR <60/MIN.  . vitamin B-12 (CYANOCOBALAMIN) 1000 MCG tablet Take 1,000 mcg by mouth daily.  . vitamin C (ASCORBIC ACID) 500 MG tablet Take 500 mg by mouth daily.  . traZODone (DESYREL) 25 mg TABS tablet Take 25 mg by mouth at bedtime.   No facility-administered encounter medications on file as of 07/19/2018.     Review of Systems  Constitutional: Negative.   HENT: Negative.   Respiratory: Negative.   Cardiovascular: Negative.   Genitourinary: Negative.   Musculoskeletal: Negative.   Neurological: Negative.   Psychiatric/Behavioral: Positive for behavioral problems and confusion.    Vitals:   07/19/18 1027  BP: (!) 160/80  Pulse: 70  Resp: 18  Temp: (!) 97.1 F (36.2 C)  Weight: 150 lb 9.6 oz  (68.3 kg)  Height: 4\' 11"  (1.499 m)   Body mass index is 30.42 kg/m. Physical Exam Vitals signs and nursing note reviewed.  Constitutional:      Appearance: Normal appearance. She is normal weight.  HENT:     Head: Normocephalic.     Nose: Nose normal.     Mouth/Throat:     Mouth: Mucous membranes are dry.  Neck:     Musculoskeletal: Normal range of motion.  Cardiovascular:     Rate and Rhythm: Normal rate and regular rhythm.  Pulmonary:  Effort: Pulmonary effort is normal.     Breath sounds: Normal breath sounds.  Neurological:     General: No focal deficit present.     Mental Status: She is alert.     Comments: Oriented to person and time and somewhat to place  Psychiatric:     Comments: Behaviors are exit seeking and refusal to take baths     Labs reviewed: Basic Metabolic Panel: Recent Labs    10/10/17 02/06/18  NA 138 143  K 4.3 4.2  CL 106 110  CO2 27 25  BUN 19 15  CREATININE 1.0 0.9  CALCIUM 10.6 9.8   Liver Function Tests: Recent Labs    10/10/17 02/06/18  AST 11* 11*  ALT 7 7  ALKPHOS 74 76  PROT 7.0  --   ALBUMIN 4.1 3.7   No results for input(s): LIPASE, AMYLASE in the last 8760 hours. No results for input(s): AMMONIA in the last 8760 hours. CBC: Recent Labs    10/10/17 02/06/18 05/01/18  WBC 7.1 8.7 8.1  HGB 13.7 11.9* 13.7  HCT 40 36 41  PLT 241 223 244   Cardiac Enzymes: No results for input(s): CKTOTAL, CKMB, CKMBINDEX, TROPONINI in the last 8760 hours. BNP: Invalid input(s): POCBNP Lab Results  Component Value Date   HGBA1C 5.3 11/01/2016   Lab Results  Component Value Date   TSH 1.57 02/06/2018   Lab Results  Component Value Date   [id number] 254 10/19/2017   No results found for: FOLATE No results found for: IRON, TIBC, FERRITIN  Imaging and Procedures obtained prior to SNF admission: Patient was never admitted.  Assessment/Plan 1.  Alzheimer's dementia Certainly present but only moderately symptomatic.   Behaviors are best handled with admission to memory care rather than medication  2.  Hypertension Patient takes metoprolol and lisinopril for blood pressure.  Pressures recently have been slightly elevated.  We will continue to follow  3.  Hypothyroid. Takes thyroid replacement and last TSH 5 months ago was in therapeutic range    Family/ staff Communication: Findings communicated to nursing staff  Labs/tests ordered:  Lillette Boxer. Sabra Heck, [place] [address] [place], [place] Office [phone]

## 2018-07-24 DIAGNOSIS — M6281 Muscle weakness (generalized): Secondary | ICD-10-CM | POA: Diagnosis not present

## 2018-07-24 DIAGNOSIS — Z9181 History of falling: Secondary | ICD-10-CM | POA: Diagnosis not present

## 2018-07-24 DIAGNOSIS — R2681 Unsteadiness on feet: Secondary | ICD-10-CM | POA: Diagnosis not present

## 2018-07-25 DIAGNOSIS — R2681 Unsteadiness on feet: Secondary | ICD-10-CM | POA: Diagnosis not present

## 2018-07-25 DIAGNOSIS — Z9181 History of falling: Secondary | ICD-10-CM | POA: Diagnosis not present

## 2018-07-25 DIAGNOSIS — M6281 Muscle weakness (generalized): Secondary | ICD-10-CM | POA: Diagnosis not present

## 2018-07-26 DIAGNOSIS — R2681 Unsteadiness on feet: Secondary | ICD-10-CM | POA: Diagnosis not present

## 2018-07-26 DIAGNOSIS — M6281 Muscle weakness (generalized): Secondary | ICD-10-CM | POA: Diagnosis not present

## 2018-07-26 DIAGNOSIS — Z9181 History of falling: Secondary | ICD-10-CM | POA: Diagnosis not present

## 2018-07-29 DIAGNOSIS — R2681 Unsteadiness on feet: Secondary | ICD-10-CM | POA: Diagnosis not present

## 2018-07-29 DIAGNOSIS — M6281 Muscle weakness (generalized): Secondary | ICD-10-CM | POA: Diagnosis not present

## 2018-07-29 DIAGNOSIS — Z9181 History of falling: Secondary | ICD-10-CM | POA: Diagnosis not present

## 2018-08-15 ENCOUNTER — Encounter: Payer: Self-pay | Admitting: Nurse Practitioner

## 2018-08-15 ENCOUNTER — Non-Acute Institutional Stay (SKILLED_NURSING_FACILITY): Payer: Medicare Other | Admitting: Nurse Practitioner

## 2018-08-15 DIAGNOSIS — E538 Deficiency of other specified B group vitamins: Secondary | ICD-10-CM | POA: Diagnosis not present

## 2018-08-15 DIAGNOSIS — I10 Essential (primary) hypertension: Secondary | ICD-10-CM | POA: Diagnosis not present

## 2018-08-15 DIAGNOSIS — E039 Hypothyroidism, unspecified: Secondary | ICD-10-CM | POA: Diagnosis not present

## 2018-08-15 DIAGNOSIS — I129 Hypertensive chronic kidney disease with stage 1 through stage 4 chronic kidney disease, or unspecified chronic kidney disease: Secondary | ICD-10-CM | POA: Diagnosis not present

## 2018-08-15 DIAGNOSIS — F329 Major depressive disorder, single episode, unspecified: Secondary | ICD-10-CM

## 2018-08-15 DIAGNOSIS — G301 Alzheimer's disease with late onset: Secondary | ICD-10-CM | POA: Diagnosis not present

## 2018-08-15 DIAGNOSIS — F0280 Dementia in other diseases classified elsewhere without behavioral disturbance: Secondary | ICD-10-CM | POA: Diagnosis not present

## 2018-08-15 NOTE — Assessment & Plan Note (Signed)
Stage III, last GFR

## 2018-08-15 NOTE — Assessment & Plan Note (Signed)
Stable, continue Levothyroxine 30mcg qd, last TSH 1.57 02/06/18, update TSH

## 2018-08-15 NOTE — Assessment & Plan Note (Addendum)
Chronic mildly elevated Sbp in 150-160s, asymptomatic, continue Metoprolol 173m qd, 768mqd, Lisinopril 4031md. Update CMP eGFR

## 2018-08-15 NOTE — Assessment & Plan Note (Signed)
07/11/18 MMSE 16/30, continue memory care unit for safety and care assistance.

## 2018-08-15 NOTE — Assessment & Plan Note (Signed)
Continue Vit B12 1000mcg qd, update CBC 

## 2018-08-15 NOTE — Assessment & Plan Note (Signed)
Stable, continue Cymbalta 30mg  qd.

## 2018-08-15 NOTE — Progress Notes (Signed)
Location:  [facility] [place] Room Number: 106 Place of Service:  SNF (31) Provider: Marlana Latus  NP  [name], MD  Patient Care Team: [name], MD as PCP - General (Internal Medicine) [place] Sarinana X, NP as Nurse Practitioner (Internal Medicine)  Extended Emergency Contact Information Primary Emergency Contact: [facility] Address: [address]          [place],  [address] [name] of [place] Phone: [phone] Relation: Son  Code Status:  DNR Goals of care: Advanced Directive information Advanced Directives 08/16/2018  Does Patient Have a Medical Advance Directive? Yes  Type of Advance Directive Out of facility DNR (pink MOST or yellow form)  Does patient want to make changes to medical advance directive? No - Patient declined  Copy of [place] in Chart? -  Pre-existing out of facility DNR order (yellow form or pink MOST form) Yellow form placed in chart (order not valid for inpatient use)     Chief Complaint  Patient presents with  . Medical Management of Chronic Issues    HPI:  Pt is a 85 y.o. female seen today for medical management of chronic diseases.     The patient has history of HTN, baseline Sbp in 150-160, on Metoprolol 64m qd, 1031mqd. Lisinopril 4023md. Her mood is stable on Duloxetine 44m47m. Hypothyroidism, on Levothyroxine 25mc66m, last TSH. [facility], not on memory preserving meds.   Past Medical History:  Diagnosis Date  . Alzheimer disease (HCC) [place]22/2018  . Depression, recurrent (HCC) [place]22/2018  . HTN (hypertension) 10/27/2016  . Hypertension   . Hypertensive kidney disease with CKD (chronic kidney disease) stage V (HCC) [place]28/2018   11/02/16 Na 136, K 4.7, Bun 30, creat 1.52, TSH 0.86, wbc 6.7, Hgb 11.3, plt 264  . Hypothyroidism 10/27/2016  . Osteoarthritis 10/27/2016  . Thyroid disease    Past Surgical History:    Procedure Laterality Date  . ABDOMINAL HYSTERECTOMY  1986  . KNEE ARTHROSCOPY  2015/2017   Dr. LoibyGustavus BryantPINE SURGERY  2009, 2011   Dr.Mark Li   Nicoletta Dresso Known Allergies  Outpatient Encounter Medications as of 08/15/2018  Medication Sig  . acetaminophen (TYLENOL) 325 MG tablet Take 650 mg by mouth every 8 (eight) hours as needed for mild pain.   . cholecalciferol (VITAMIN D) 1000 units tablet Take 1,000 Units by mouth daily.  . DULoxetine (CYMBALTA) 30 MG capsule Take 30 mg by mouth daily.   . levMarland Kitchenthyroxine (SYNTHROID, LEVOTHROID) 25 MCG tablet Take 25 mcg by mouth daily before breakfast.  . lisinopril (PRINIVIL,ZESTRIL) 40 MG tablet Take 40 mg by mouth daily.   . Melatonin 5 MG TABS Take 5 mg by mouth at bedtime as needed.  . metoprolol tartrate (LOPRESSOR) 100 MG tablet TAKE 100 MG AT BED TIME HOLD FOR SBP<110 AND OR HR<60 /MIN  . traZODone (DESYREL) 25 mg TABS tablet Take 25 mg by mouth at bedtime.  . vitamin B-12 (CYANOCOBALAMIN) 1000 MCG tablet Take 1,000 mcg by mouth daily.  . vitamin C (ASCORBIC ACID) 500 MG tablet Take 500 mg by mouth daily.  . [DISCONTINUED] Metoprolol Tartrate 75 MG TABS METOPROLOL TARTRATE 75 MG BY DAILY HOLD FOR SBP <110 AND OR HR <60/MIN.  . [DISCONTINUED] traZODone (DESYREL) 25 mg TABS tablet Take 25 mg by mouth at bedtime.   No facility-administered encounter medications on file as of 08/15/2018.  ROS was provided with assistance of staff Review of Systems  Constitutional: Positive for unexpected weight change. Negative for activity change, appetite change, chills, diaphoresis, fatigue and fever.       Weight loss about # 6Ibs in the past 6 months, stable in the past 30 days.   HENT: Positive for hearing loss. Negative for congestion and voice change.   Respiratory: Negative for cough, shortness of breath and wheezing.   Cardiovascular: Positive for leg swelling. Negative for chest pain and palpitations.  Gastrointestinal: Negative for abdominal  distention, abdominal pain, constipation, diarrhea, nausea and vomiting.  Genitourinary: Negative for difficulty urinating, dysuria and urgency.  Musculoskeletal: Positive for arthralgias and gait problem.  Skin: Negative for color change and pallor.  Neurological: Negative for dizziness, speech difficulty, weakness and headaches.       Dementia  Psychiatric/Behavioral: Positive for confusion. Negative for agitation, behavioral problems, hallucinations and sleep disturbance. The patient is not nervous/anxious.     Immunization History  Administered Date(s) Administered  . Influenza-Unspecified 05/29/2017  . Pneumococcal Conjugate-13 06/21/2017  . Tdap 06/21/2017   Pertinent  Health Maintenance Due  Topic Date Due  . PNA vac Low Risk Adult (2 of 2 - PPSV23) 06/21/2018  . INFLUENZA VACCINE  05/13/2019 (Originally 03/08/2018)  . DEXA SCAN  Completed   Fall Risk  04/27/2018 04/25/2017  Falls in the past year? No No   Functional Status Survey:    Vitals:   08/16/18 0916  BP: (!) 150/80  Pulse: 76  Resp: 18  Temp: (!) 97.1 F (36.2 C)  SpO2: 96%  Weight: 150 lb 12.8 oz (68.4 kg)  Height: 4' 11"  (1.499 m)   Body mass index is 30.46 kg/m. Physical Exam Constitutional:      General: She is not in acute distress.    Appearance: She is not ill-appearing, toxic-appearing or diaphoretic.     Comments: overweight  HENT:     Head: Normocephalic.     Nose: Nose normal.     Mouth/Throat:     Mouth: Mucous membranes are moist.  Eyes:     Pupils: Pupils are equal, round, and reactive to light.  Neck:     Musculoskeletal: Normal range of motion and neck supple.  Cardiovascular:     Rate and Rhythm: Normal rate and regular rhythm.     Heart sounds: No murmur.  Pulmonary:     Effort: Pulmonary effort is normal.     Breath sounds: No wheezing, rhonchi or rales.  Abdominal:     General: There is no distension.     Palpations: Abdomen is soft.     Tenderness: There is no abdominal  tenderness. There is no guarding or rebound.  Musculoskeletal:     Right lower leg: Edema present.     Left lower leg: Edema present.     Comments: Trace edema BLE  Skin:    General: Skin is warm and dry.     Coloration: Skin is not pale.     Findings: No erythema or rash.  Neurological:     General: No focal deficit present.     Mental Status: She is alert. Mental status is at baseline.     Cranial Nerves: No cranial nerve deficit.     Motor: No weakness.     Coordination: Coordination normal.     Gait: Gait abnormal.     Comments: Oriented to person and her room on unit  Psychiatric:        Mood and  Affect: Mood normal.        Behavior: Behavior normal.     Labs reviewed: Recent Labs    10/10/17 02/06/18  NA 138 143  K 4.3 4.2  CL 106 110  CO2 27 25  BUN 19 15  CREATININE 1.0 0.9  CALCIUM 10.6 9.8   Recent Labs    10/10/17 02/06/18  AST 11* 11*  ALT 7 7  ALKPHOS 74 76  PROT 7.0  --   ALBUMIN 4.1 3.7   Recent Labs    10/10/17 02/06/18 05/01/18  WBC 7.1 8.7 8.1  HGB 13.7 11.9* 13.7  HCT 40 36 41  PLT 241 223 244   Lab Results  Component Value Date   TSH 1.57 02/06/2018   Lab Results  Component Value Date   HGBA1C 5.3 11/01/2016   No results found for: CHOL, HDL, LDLCALC, LDLDIRECT, TRIG, CHOLHDL  Significant Diagnostic Results in last 30 days:  No results found.  Assessment/Plan HTN (hypertension) Chronic mildly elevated Sbp in 150-160s, asymptomatic, continue Metoprolol 161m qd, 7100mqd, Lisinopril 4094md. Update CMP eGFR   Chronic kidney disease due to hypertension Stage III, last GFR   Hypothyroidism Stable, continue Levothyroxine 24m81md, last TSH 1.57 02/06/18, update TSH  Alzheimer disease (HCC)[place]/4/19 MMSE 16/30, continue memory care unit for safety and care assistance.   Major depression, chronic Stable, continue Cymbalta 30mg58m   B12 deficiency Continue Vit B12 1000mcg53m update CBC '  Family/ staff Communication: plan of  care reviewed with the patient and charge nurse.   Labs/tests ordered: CBC CMP eGFR TSH  Time spend 25 minutes.

## 2018-08-16 ENCOUNTER — Encounter: Payer: Self-pay | Admitting: Nurse Practitioner

## 2018-08-16 DIAGNOSIS — I10 Essential (primary) hypertension: Secondary | ICD-10-CM | POA: Diagnosis not present

## 2018-08-16 DIAGNOSIS — E039 Hypothyroidism, unspecified: Secondary | ICD-10-CM | POA: Diagnosis not present

## 2018-08-16 DIAGNOSIS — E538 Deficiency of other specified B group vitamins: Secondary | ICD-10-CM | POA: Diagnosis not present

## 2018-08-16 LAB — BASIC METABOLIC PANEL
BUN: 12 (ref 4–21)
CREATININE: 1 (ref <=1.1)
GLUCOSE: 94
Potassium: 4.1 (ref 3.4–5.3)
SODIUM: 141 (ref 137–147)

## 2018-08-16 LAB — CBC AND DIFFERENTIAL
HCT: 36 (ref 36–46)
HEMOGLOBIN: 11.8 — AB (ref 12.0–16.0)
Platelets: 224 (ref 150–399)
WBC: 6.5

## 2018-08-16 LAB — HEPATIC FUNCTION PANEL
ALT: 7 (ref 7–35)
AST: 9 — AB (ref 13–35)
Alkaline Phosphatase: 73 (ref 25–125)
Bilirubin, Total: 0.2

## 2018-08-16 LAB — TSH: TSH: 1.94 (ref <=5.90)

## 2018-08-20 ENCOUNTER — Other Ambulatory Visit: Payer: Self-pay | Admitting: *Deleted

## 2018-08-20 LAB — COMPLETE METABOLIC PANEL WITH GFR
ALBUMIN: 3.4
CALCIUM: 9.6
CHLORIDE: 109
Carbon Dioxide, Total: 25
EGFR (Non-African Amer.): 51
Globulin: 1.4
TOTAL PROTEIN: 5.9 g/dL

## 2018-09-20 ENCOUNTER — Non-Acute Institutional Stay (SKILLED_NURSING_FACILITY): Payer: Medicare Other | Admitting: Nurse Practitioner

## 2018-09-20 ENCOUNTER — Encounter: Payer: Self-pay | Admitting: Nurse Practitioner

## 2018-09-20 DIAGNOSIS — F0280 Dementia in other diseases classified elsewhere without behavioral disturbance: Secondary | ICD-10-CM | POA: Diagnosis not present

## 2018-09-20 DIAGNOSIS — F329 Major depressive disorder, single episode, unspecified: Secondary | ICD-10-CM

## 2018-09-20 DIAGNOSIS — I10 Essential (primary) hypertension: Secondary | ICD-10-CM

## 2018-09-20 DIAGNOSIS — E039 Hypothyroidism, unspecified: Secondary | ICD-10-CM

## 2018-09-20 DIAGNOSIS — G301 Alzheimer's disease with late onset: Secondary | ICD-10-CM | POA: Diagnosis not present

## 2018-09-20 NOTE — Assessment & Plan Note (Addendum)
Continue Memory care unit [facility] for safety and care assistance. Adding Depakote 125mg  bid po for agitation, stabilizing mood.

## 2018-09-20 NOTE — Assessment & Plan Note (Signed)
Blood pressure is not controlled, the patient refuses meds frequently, will dc po Lisinopril/Metoprolol. Starting Clonidine transdermal 0.1mg /24h weekly. VS q shift.

## 2018-09-20 NOTE — Progress Notes (Signed)
Location:  [place] Room Number: 106 Place of Service:  SNF (31) Provider:  Marlana Latus  NP  [name], MD  Patient Care Team: [name], MD as PCP - General (Internal Medicine) Codee Tutson X, NP as Nurse Practitioner (Internal Medicine)  Extended Emergency Contact Information Primary Emergency Contact: [facility] Address: [address]          [place], [place] [address] [name] of [place] Phone: [phone] Relation: Son  Code Status:  DNR Goals of care: Advanced Directive information Advanced Directives 09/20/2018  Does Patient Have a Medical Advance Directive? Yes  Type of Advance Directive Out of facility DNR (pink MOST or yellow form)  Does patient want to make changes to medical advance directive? No - Patient declined  Copy of [place] in Chart? -  Pre-existing out of facility DNR order (yellow form or pink MOST form) Yellow form placed in chart (order not valid for inpatient use)     Chief Complaint  Patient presents with  . Medical Management of Chronic Issues    HPI:  Pt is a 85 y.o. female seen today for medical management of chronic diseases.     Hx of HTN, blood pressure is not controlled, the patient is non compliance with meds, refusals frequently, on Lisinopril/Metoprolol. She resides in memory care unit, [facility] for safety and care assistance. On Trazodone 25mg  qd/Cymbalta 30mg  qd, her mood and behaviors are: lifting walker and banging on the floor, storming her feet, Ativan used x1 with temporary calming mood, agitation, teary eyed, pacing, exiting seeking, difficulty redirecting her. Hypothyroidism, last TSH 1.94 08/16/18, on Levothyroxine 79mcg qd   Past Medical History:  Diagnosis Date  . Alzheimer disease ([place]) 10/27/2016  . Depression, recurrent ([place]) 10/27/2016  . HTN (hypertension) 10/27/2016  . Hypertension   . Hypertensive kidney disease with CKD (chronic kidney disease) stage V ([place])  11/02/2016   11/02/16 Na 136, K 4.7, Bun 30, creat 1.52, TSH 0.86, wbc 6.7, Hgb 11.3, plt 264  . Hypothyroidism 10/27/2016  . Osteoarthritis 10/27/2016  . Thyroid disease    Past Surgical History:  Procedure Laterality Date  . ABDOMINAL HYSTERECTOMY  1986  . KNEE ARTHROSCOPY  2015/2017   Dr. Gustavus Bryant  . SPINE SURGERY  2009, 2011   Dr.Mark Nicoletta Dress    No Known Allergies  Outpatient Encounter Medications as of 09/20/2018  Medication Sig  . acetaminophen (TYLENOL) 325 MG tablet Take 650 mg by mouth every 8 (eight) hours as needed for mild pain.   . cholecalciferol (VITAMIN D) 1000 units tablet Take 1,000 Units by mouth daily.  . DULoxetine (CYMBALTA) 30 MG capsule Take 30 mg by mouth daily.   Marland Kitchen levothyroxine (SYNTHROID, LEVOTHROID) 25 MCG tablet Take 25 mcg by mouth daily before breakfast.  . lisinopril (PRINIVIL,ZESTRIL) 40 MG tablet Take 40 mg by mouth daily.   . Melatonin 5 MG TABS Take 5 mg by mouth at bedtime as needed.  . metoprolol tartrate (LOPRESSOR) 100 MG tablet Take 100 mg by mouth at bedtime.  . metoprolol tartrate (LOPRESSOR) 50 MG tablet Take by mouth 2 (two) times daily. Take 1 and 1/2 tablets to equal 75 mg.  . traZODone (DESYREL) 25 mg TABS tablet Take 25 mg by mouth at bedtime.  . vitamin B-12 (CYANOCOBALAMIN) 1000 MCG tablet Take 1,000 mcg by mouth daily.  . vitamin C (ASCORBIC ACID) 500 MG tablet Take 500 mg by mouth daily.  . [DISCONTINUED] metoprolol tartrate (LOPRESSOR) 100  MG tablet TAKE 100 MG AT BED TIME HOLD FOR SBP<110 AND OR HR<60 /MIN   No facility-administered encounter medications on file as of 09/20/2018.    ROS was provided with assistance of staff Review of Systems  Constitutional: Negative for activity change, appetite change, chills, diaphoresis, fatigue, fever and unexpected weight change.  HENT: Positive for hearing loss. Negative for congestion and voice change.   Eyes: Negative for visual disturbance.  Respiratory: Negative for cough, shortness of  breath and wheezing.   Cardiovascular: Positive for leg swelling. Negative for chest pain and palpitations.  Gastrointestinal: Negative for abdominal distention, abdominal pain, constipation, diarrhea, nausea and vomiting.  Genitourinary: Negative for difficulty urinating, dysuria, hematuria and urgency.  Musculoskeletal: Positive for arthralgias and gait problem.  Skin: Negative for color change and pallor.  Neurological: Negative for dizziness, speech difficulty, weakness and headaches.       Dementia  Psychiatric/Behavioral: Positive for agitation, behavioral problems, confusion and sleep disturbance. Negative for hallucinations. The patient is nervous/anxious.     Immunization History  Administered Date(s) Administered  . Influenza-Unspecified 05/29/2017  . Pneumococcal Conjugate-13 06/21/2017  . Tdap 06/21/2017   Pertinent  Health Maintenance Due  Topic Date Due  . PNA vac Low Risk Adult (2 of 2 - PPSV23) 06/21/2018  . INFLUENZA VACCINE  05/13/2019 (Originally 03/08/2018)  . DEXA SCAN  Completed   Fall Risk  04/27/2018 04/25/2017  Falls in the past year? No No   Functional Status Survey:    Vitals:   09/20/18 1016  BP: (!) 160/98  Pulse: 72  Resp: 18  Temp: (!) 97.1 F (36.2 C)  SpO2: 94%  Weight: 151 lb 9.6 oz (68.8 kg)  Height: 4\' 11"  (1.499 m)   Body mass index is 30.62 kg/m. Physical Exam Constitutional:      General: She is not in acute distress.    Appearance: Normal appearance. She is not ill-appearing, toxic-appearing or diaphoretic.     Comments: Over weight  HENT:     Head: Normocephalic and atraumatic.     Nose: Nose normal.     Mouth/Throat:     Mouth: Mucous membranes are moist.  Eyes:     Pupils: Pupils are equal, round, and reactive to light.  Neck:     Musculoskeletal: Normal range of motion.  Cardiovascular:     Rate and Rhythm: Normal rate and regular rhythm.     Heart sounds: No murmur.  Pulmonary:     Effort: Pulmonary effort is normal.      Breath sounds: No wheezing, rhonchi or rales.  Abdominal:     General: There is no distension.     Palpations: Abdomen is soft.     Tenderness: There is no abdominal tenderness. There is no guarding or rebound.  Musculoskeletal:     Right lower leg: Edema present.     Left lower leg: Edema present.     Comments: Trace edema BLE. Ambulates with walker.   Skin:    General: Skin is warm and dry.  Neurological:     General: No focal deficit present.     Mental Status: She is alert. Mental status is at baseline.     Motor: No weakness.     Coordination: Coordination normal.     Gait: Gait abnormal.     Comments: Oriented to self.   Psychiatric:     Comments: Irritable, anxious appearance.      Labs reviewed: Recent Labs    10/10/17 02/06/18 08/16/18  NA  138 143 141  K 4.3 4.2 4.1  CL 106 110 109  CO2 27 25 25   BUN 19 15 12   CREATININE 1.0 0.9 1.0  CALCIUM 10.6 9.8 9.6   Recent Labs    10/10/17 02/06/18 08/16/18  AST 11* 11* 9*  ALT 7 7 7   ALKPHOS 74 76 73  PROT 7.0  --  5.9  ALBUMIN 4.1 3.7 3.4   Recent Labs    02/06/18 05/01/18 08/16/18  WBC 8.7 8.1 6.5  HGB 11.9* 13.7 11.8*  HCT 36 41 36  PLT 223 244 224   Lab Results  Component Value Date   TSH 1.94 08/16/2018   Lab Results  Component Value Date   HGBA1C 5.3 11/01/2016   No results found for: CHOL, HDL, LDLCALC, LDLDIRECT, TRIG, CHOLHDL  Significant Diagnostic Results in last 30 days:  No results found.  Assessment/Plan HTN (hypertension) Blood pressure is not controlled, the patient refuses meds frequently, will dc po Lisinopril/Metoprolol. Starting Clonidine transdermal 0.1mg /24h weekly. VS q shift.   Alzheimer disease ([place]) Continue Memory care unit [facility] for safety and care assistance. Adding Depakote 125mg  bid po for agitation, stabilizing mood.   Major depression, chronic Mood is not well controlled, continue Cymbalta 30mg  qd, Trazodone 25mg  qd, adding Depakote 1250mg  bid for mood and  behaviors. Observe.   Hypothyroidism Stable, continue Levothyroxine 4mcg qd, last TSH wnl 1.94 08/16/18  '   Family/ staff Communication: plan of care reviewed with the patient and charge nurse.   Labs/tests ordered: none  Time spend 25 minutes.

## 2018-09-20 NOTE — Assessment & Plan Note (Signed)
Mood is not well controlled, continue Cymbalta 30mg  qd, Trazodone 25mg  qd, adding Depakote 1250mg  bid for mood and behaviors. Observe.

## 2018-09-20 NOTE — Assessment & Plan Note (Signed)
Stable, continue Levothyroxine 50mcg qd, last TSH wnl 1.94 08/16/18

## 2018-10-02 ENCOUNTER — Non-Acute Institutional Stay (SKILLED_NURSING_FACILITY): Payer: Medicare Other | Admitting: Internal Medicine

## 2018-10-02 ENCOUNTER — Encounter: Payer: Self-pay | Admitting: Internal Medicine

## 2018-10-02 DIAGNOSIS — I10 Essential (primary) hypertension: Secondary | ICD-10-CM

## 2018-10-02 DIAGNOSIS — G301 Alzheimer's disease with late onset: Secondary | ICD-10-CM

## 2018-10-02 DIAGNOSIS — F0280 Dementia in other diseases classified elsewhere without behavioral disturbance: Secondary | ICD-10-CM

## 2018-10-02 DIAGNOSIS — F329 Major depressive disorder, single episode, unspecified: Secondary | ICD-10-CM

## 2018-10-02 DIAGNOSIS — E039 Hypothyroidism, unspecified: Secondary | ICD-10-CM | POA: Diagnosis not present

## 2018-10-02 NOTE — Progress Notes (Signed)
Location:  [place] Room Number: 106 Place of Service:  SNF [phone]) Provider:    [name], MD  Patient Care Team: [name], MD as PCP - General (Internal Medicine) Mast, Man X, NP as Nurse Practitioner (Internal Medicine)  Extended Emergency Contact Information Primary Emergency Contact: [facility] Address: [address]          [place], [place] [address] [name] of [place] Phone: [phone] Relation: Son  Code Status:  DNR Goals of care: Advanced Directive information Advanced Directives 10/02/2018  Does Patient Have a Medical Advance Directive? Yes  Type of Advance Directive Out of facility DNR (pink MOST or yellow form)  Does patient want to make changes to medical advance directive? No - Patient declined  Copy of [place] in Chart? -  Pre-existing out of facility DNR order (yellow form or pink MOST form) Yellow form placed in chart (order not valid for inpatient use)     Chief Complaint  Patient presents with  . Acute Visit    Review her Meds Per Pharmacy    HPI:  Pt is a 85 y.o. female seen today for an acute visit to review the Need for her Trazadone and Cymbalta Patient has h/o Dementia with Behavior issues., Hypertension,Hypothyroidism,   Today she was seen to see if she continues to need trazadone. Patietn unable to give me any history. She has Advanced demenita She Walks with the walker.  Has not had any falls.  Per nurses she does not have any acute problems.  Her appetite is good and her weight is stable.  But patient does have behavior problems and does not sleep sometimes at night.  When asked patient she said that she does not know why and does not remember. She also refuses her meds sometimes and her BP gets elevated   Past Medical History:  Diagnosis Date  . Alzheimer disease ([place]) 10/27/2016  . Depression, recurrent ([place]) 10/27/2016  . HTN (hypertension) 10/27/2016  . Hypertension     . Hypertensive kidney disease with CKD (chronic kidney disease) stage V ([place]) 11/02/2016   11/02/16 Na 136, K 4.7, Bun 30, creat 1.52, TSH 0.86, wbc 6.7, Hgb 11.3, plt 264  . Hypothyroidism 10/27/2016  . Osteoarthritis 10/27/2016  . Thyroid disease    Past Surgical History:  Procedure Laterality Date  . ABDOMINAL HYSTERECTOMY  1986  . KNEE ARTHROSCOPY  2015/2017   Dr. Gustavus Bryant  . SPINE SURGERY  2009, 2011   Dr.Mark Nicoletta Dress    No Known Allergies  Outpatient Encounter Medications as of 10/02/2018  Medication Sig  . acetaminophen (TYLENOL) 325 MG tablet Take 650 mg by mouth every 8 (eight) hours as needed for mild pain.   . cholecalciferol (VITAMIN D) 1000 units tablet Take 1,000 Units by mouth daily.  Marland Kitchen dextromethorphan-guaiFENesin (ROBAFEN DM COUGH) 10-100 MG/5ML liquid Take 10 mLs by mouth every 6 (six) hours as needed for cough.  . divalproex (DEPAKOTE SPRINKLE) 125 MG capsule Take 125 mg by mouth 2 (two) times daily.  . DULoxetine (CYMBALTA) 30 MG capsule Take 30 mg by mouth daily.   Marland Kitchen levothyroxine (SYNTHROID, LEVOTHROID) 25 MCG tablet Take 25 mcg by mouth daily before breakfast.  . Melatonin 5 MG TABS Take 5 mg by mouth at bedtime as needed.  Marland Kitchen PRESCRIPTION MEDICATION Inject 0.1 mg into the skin daily. On Thursday  . traZODone (DESYREL) 25 mg TABS tablet Take 25 mg by mouth at bedtime.  . vitamin  B-12 (CYANOCOBALAMIN) 1000 MCG tablet Take 1,000 mcg by mouth daily.  . vitamin C (ASCORBIC ACID) 500 MG tablet Take 500 mg by mouth daily.  . [DISCONTINUED] lisinopril (PRINIVIL,ZESTRIL) 40 MG tablet Take 40 mg by mouth daily.   . [DISCONTINUED] metoprolol tartrate (LOPRESSOR) 100 MG tablet Take 100 mg by mouth at bedtime.  . [DISCONTINUED] metoprolol tartrate (LOPRESSOR) 50 MG tablet Take by mouth 2 (two) times daily. Take 1 and 1/2 tablets to equal 75 mg.   No facility-administered encounter medications on file as of 10/02/2018.     Review of Systems  Unable to perform ROS: Dementia     Immunization History  Administered Date(s) Administered  . Influenza-Unspecified 05/29/2017  . Pneumococcal Conjugate-13 06/21/2017  . Tdap 06/21/2017   Pertinent  Health Maintenance Due  Topic Date Due  . PNA vac Low Risk Adult (2 of 2 - PPSV23) 06/21/2018  . INFLUENZA VACCINE  05/13/2019 (Originally 03/08/2018)  . DEXA SCAN  Completed   Fall Risk  04/27/2018 04/25/2017  Falls in the past year? No No   Functional Status Survey:    Vitals:   10/02/18 1213  BP: (!) 158/88  Pulse: 75  Resp: 18  Temp: 98.4 F (36.9 C)  SpO2: 94%  Weight: 151 lb 9.6 oz (68.8 kg)   Body mass index is 30.62 kg/m. Physical Exam Vitals signs reviewed.  Constitutional:      Appearance: Normal appearance.  HENT:     Head: Normocephalic.     Nose: Nose normal.     Mouth/Throat:     Mouth: Mucous membranes are moist.     Pharynx: Oropharynx is clear.  Eyes:     Pupils: Pupils are equal, round, and reactive to light.  Cardiovascular:     Rate and Rhythm: Normal rate and regular rhythm.     Pulses: Normal pulses.     Heart sounds: Normal heart sounds.  Pulmonary:     Effort: Pulmonary effort is normal. No respiratory distress.     Breath sounds: Normal breath sounds. No wheezing or rales.  Abdominal:     General: Abdomen is flat. Bowel sounds are normal.     Palpations: Abdomen is soft.  Musculoskeletal:        General: No swelling.  Skin:    General: Skin is warm and dry.  Neurological:     General: No focal deficit present.     Mental Status: She is alert.     Comments: Patient is not oriented. She says she does not know her Age or year or name of the Place  Psychiatric:     Comments: Mildly Anxious. She was telling the Nurse that she needs to go out for a walk and then go home     Labs reviewed: Recent Labs    10/10/17 02/06/18 08/16/18  NA 138 143 141  K 4.3 4.2 4.1  CL 106 110 109  CO2 27 25 25   BUN 19 15 12   CREATININE 1.0 0.9 1.0  CALCIUM 10.6 9.8 9.6   Recent  Labs    10/10/17 02/06/18 08/16/18  AST 11* 11* 9*  ALT 7 7 7   ALKPHOS 74 76 73  PROT 7.0  --  5.9  ALBUMIN 4.1 3.7 3.4   Recent Labs    02/06/18 05/01/18 08/16/18  WBC 8.7 8.1 6.5  HGB 11.9* 13.7 11.8*  HCT 36 41 36  PLT 223 244 224   Lab Results  Component Value Date   TSH 1.94 08/16/2018  Lab Results  Component Value Date   HGBA1C 5.3 11/01/2016   No results found for: CHOL, HDL, LDLCALC, LDLDIRECT, TRIG, CHOLHDL  Significant Diagnostic Results in last 30 days:  No results found.  Assessment/Plan Dementia with insomnia and Behavior Problems We will continue her on trazodone 25 mg nightly.  Most of the time patient is refusing the medicine.  I do not want to increase the dose at this time . will reconsider in next visit Will also Continue Depakote for now Depression Continue on Cymbalta Hypertension On lisinopril and metoprolol Patient sometimes refuses medicines so it is hard to know if these meds meds are helping her blood pressure Hypothyroid On Synthroid supplement   Family/ staff Communication:   Labs/tests ordered:   Total time spent in this patient care encounter was 25_ minutes; greater than 50% of the visit spent counseling patient, reviewing records , Labs and coordinating care for problems addressed at this encounter.

## 2018-10-03 ENCOUNTER — Encounter: Payer: Self-pay | Admitting: Nurse Practitioner

## 2018-10-03 ENCOUNTER — Non-Acute Institutional Stay (SKILLED_NURSING_FACILITY): Payer: Medicare Other | Admitting: Nurse Practitioner

## 2018-10-03 DIAGNOSIS — F0280 Dementia in other diseases classified elsewhere without behavioral disturbance: Secondary | ICD-10-CM | POA: Diagnosis not present

## 2018-10-03 DIAGNOSIS — J069 Acute upper respiratory infection, unspecified: Secondary | ICD-10-CM | POA: Diagnosis not present

## 2018-10-03 DIAGNOSIS — G301 Alzheimer's disease with late onset: Secondary | ICD-10-CM

## 2018-10-03 DIAGNOSIS — R05 Cough: Secondary | ICD-10-CM | POA: Diagnosis not present

## 2018-10-03 NOTE — Progress Notes (Signed)
Location:  [facility] Room Number: 106 Place of Service:  SNF (31) Provider:  Marlana Latus  NP  [name], MD  Patient Care Team: [name], MD as PCP - General (Internal Medicine) Kamden Reber X, NP as Nurse Practitioner (Internal Medicine)  Extended Emergency Contact Information Primary Emergency Contact: [facility] Address: [address]          [place], [place] [address] [name] of [place] Phone: [phone] Relation: Son  Code Status:  DNR Goals of care: Advanced Directive information Advanced Directives 10/02/2018  Does Patient Have a Medical Advance Directive? Yes  Type of Advance Directive Out of facility DNR (pink MOST or yellow form)  Does patient want to make changes to medical advance directive? No - Patient declined  Copy of [place] in Chart? -  Pre-existing out of facility DNR order (yellow form or pink MOST form) Yellow form placed in chart (order not valid for inpatient use)     Chief Complaint  Patient presents with  . Acute Visit    C/o- cough x 2 days    HPI:  Pt is a 85 y.o. female seen today for an acute visit for cough x 2 days, robafen has little efficacy. She denied sore throat, chest pain/pressure, palpitation, or SOB. No O2 desaturation. She is afebrile. HPI was provided with assistance of staff. She resides in memory care unit [facility] for safety and care assistance.    Past Medical History:  Diagnosis Date  . Alzheimer disease ([place]) 10/27/2016  . Depression, recurrent ([place]) 10/27/2016  . HTN (hypertension) 10/27/2016  . Hypertension   . Hypertensive kidney disease with CKD (chronic kidney disease) stage V ([place]) 11/02/2016   11/02/16 Na 136, K 4.7, Bun 30, creat 1.52, TSH 0.86, wbc 6.7, Hgb 11.3, plt 264  . Hypothyroidism 10/27/2016  . Osteoarthritis 10/27/2016  . Thyroid disease    Past Surgical History:  Procedure Laterality Date  . ABDOMINAL HYSTERECTOMY  1986  . KNEE  ARTHROSCOPY  2015/2017   Dr. Gustavus Bryant  . SPINE SURGERY  2009, 2011   Dr.Mark Nicoletta Dress    No Known Allergies  Outpatient Encounter Medications as of 10/03/2018  Medication Sig  . acetaminophen (TYLENOL) 325 MG tablet Take 650 mg by mouth every 8 (eight) hours as needed for mild pain.   . cholecalciferol (VITAMIN D) 1000 units tablet Take 1,000 Units by mouth daily.  . cloNIDine (CATAPRES - DOSED IN MG/24 HR) 0.1 mg/24hr patch Place 0.1 mg onto the skin once a week. On thursday  . dextromethorphan-guaiFENesin (ROBAFEN DM COUGH) 10-100 MG/5ML liquid Take 10 mLs by mouth every 6 (six) hours as needed for cough.  . DULoxetine (CYMBALTA) 30 MG capsule Take 30 mg by mouth daily.   Marland Kitchen levothyroxine (SYNTHROID, LEVOTHROID) 25 MCG tablet Take 25 mcg by mouth daily before breakfast.  . Melatonin 5 MG TABS Take 5 mg by mouth at bedtime as needed.  . traZODone (DESYREL) 25 mg TABS tablet Take 25 mg by mouth at bedtime.  . vitamin B-12 (CYANOCOBALAMIN) 1000 MCG tablet Take 1,000 mcg by mouth daily.  . vitamin C (ASCORBIC ACID) 500 MG tablet Take 500 mg by mouth daily.  . [DISCONTINUED] cloNIDine (CATAPRES) 0.1 MG tablet Take 0.1 mg by mouth. Once a day on Thursday.  . [DISCONTINUED] divalproex (DEPAKOTE SPRINKLE) 125 MG capsule Take 125 mg by mouth 2 (two) times daily.  . [DISCONTINUED] PRESCRIPTION MEDICATION Inject 0.1 mg into the skin daily. On  Thursday   No facility-administered encounter medications on file as of 10/03/2018.    ROS was provided with assistance of staff.  Review of Systems  Constitutional: Positive for fatigue. Negative for activity change, appetite change, chills, diaphoresis and fever.  HENT: Positive for hearing loss. Negative for congestion and voice change.   Respiratory: Positive for cough. Negative for shortness of breath and wheezing.   Cardiovascular: Positive for leg swelling. Negative for chest pain.  Gastrointestinal: Negative for abdominal distention, constipation, diarrhea,  nausea and vomiting.  Genitourinary: Negative for difficulty urinating, dysuria and urgency.  Musculoskeletal: Positive for arthralgias and gait problem.  Skin: Negative for color change and pallor.  Neurological: Negative for dizziness, speech difficulty, weakness and headaches.       Dementia  Psychiatric/Behavioral: Positive for confusion. Negative for agitation, behavioral problems, hallucinations and sleep disturbance. The patient is not nervous/anxious.     Immunization History  Administered Date(s) Administered  . Influenza-Unspecified 05/29/2017  . Pneumococcal Conjugate-13 06/21/2017  . Tdap 06/21/2017   Pertinent  Health Maintenance Due  Topic Date Due  . PNA vac Low Risk Adult (2 of 2 - PPSV23) 06/21/2018  . INFLUENZA VACCINE  05/13/2019 (Originally 03/08/2018)  . DEXA SCAN  Completed   Fall Risk  04/27/2018 04/25/2017  Falls in the past year? No No   Functional Status Survey:    Vitals:   10/03/18 1552  BP: 140/70  Pulse: 98  Resp: (!) 22  Temp: (!) 97.3 F (36.3 C)  SpO2: 96%  Weight: 151 lb 9.6 oz (68.8 kg)  Height: 4\' 11"  (1.499 m)   Body mass index is 30.62 kg/m. Physical Exam Vitals signs and nursing note reviewed.  Constitutional:      General: She is not in acute distress.    Appearance: Normal appearance. She is not ill-appearing, toxic-appearing or diaphoretic.  HENT:     Head: Normocephalic and atraumatic.     Nose: Nose normal.     Mouth/Throat:     Mouth: Mucous membranes are moist.     Pharynx: No oropharyngeal exudate or posterior oropharyngeal erythema.  Eyes:     Extraocular Movements: Extraocular movements intact.     Pupils: Pupils are equal, round, and reactive to light.  Neck:     Musculoskeletal: Normal range of motion and neck supple.  Cardiovascular:     Rate and Rhythm: Normal rate and regular rhythm.     Heart sounds: No murmur.  Pulmonary:     Effort: Pulmonary effort is normal.     Breath sounds: No wheezing, rhonchi or  rales.  Abdominal:     General: There is no distension.     Palpations: Abdomen is soft.     Tenderness: There is no guarding or rebound.  Musculoskeletal:     Right lower leg: Edema present.     Left lower leg: Edema present.     Comments: Trace edema BLE. Ambulates with walker.   Skin:    General: Skin is warm and dry.  Neurological:     General: No focal deficit present.     Mental Status: She is alert. Mental status is at baseline.     Gait: Gait abnormal.     Comments: Oriented to self.   Psychiatric:        Mood and Affect: Mood normal.     Labs reviewed: Recent Labs    10/10/17 02/06/18 08/16/18  NA 138 143 141  K 4.3 4.2 4.1  CL 106 110 109  CO2 27 25 25   BUN 19 15 12   CREATININE 1.0 0.9 1.0  CALCIUM 10.6 9.8 9.6   Recent Labs    10/10/17 02/06/18 08/16/18  AST 11* 11* 9*  ALT 7 7 7   ALKPHOS 74 76 73  PROT 7.0  --  5.9  ALBUMIN 4.1 3.7 3.4   Recent Labs    02/06/18 05/01/18 08/16/18  WBC 8.7 8.1 6.5  HGB 11.9* 13.7 11.8*  HCT 36 41 36  PLT 223 244 224   Lab Results  Component Value Date   TSH 1.94 08/16/2018   Lab Results  Component Value Date   HGBA1C 5.3 11/01/2016   No results found for: CHOL, HDL, LDLCALC, LDLDIRECT, TRIG, CHOLHDL  Significant Diagnostic Results in last 30 days:  No results found.  Assessment/Plan Upper respiratory infection Obtain CXR to r/o PNA, will try Claritin 10mg  qd, Mucinex 600mg  bid x 5 days.   Alzheimer disease ([place]) Continue Memory care unit [facility] for safety and care assistance.      Family/ staff Communication: plan of care reviewed with the patient and charge nurse.   Labs/tests ordered:  CXR  Time spend 25 minutes.

## 2018-10-03 NOTE — Assessment & Plan Note (Signed)
Continue Memory care unit [facility] for safety and care assistance.

## 2018-10-03 NOTE — Assessment & Plan Note (Signed)
Obtain CXR to r/o PNA, will try Claritin 10mg  qd, Mucinex 600mg  bid x 5 days.

## 2018-10-04 ENCOUNTER — Encounter: Payer: Self-pay | Admitting: Nurse Practitioner

## 2018-10-25 DIAGNOSIS — I10 Essential (primary) hypertension: Secondary | ICD-10-CM | POA: Diagnosis not present

## 2018-10-26 LAB — CBC AND DIFFERENTIAL
HCT: 36 (ref 36–46)
Hemoglobin: 11.7 — AB (ref 12.0–16.0)
Platelets: 219 (ref 150–399)

## 2018-10-26 LAB — BASIC METABOLIC PANEL
BUN: 11 (ref 4–21)
Creatinine: 0.9 (ref 0.5–1.1)
Glucose: 81
Potassium: 4.3 (ref 3.4–5.3)
Sodium: 140 (ref 137–147)

## 2018-10-26 LAB — HEPATIC FUNCTION PANEL
ALT: 8 (ref 7–35)
AST: 13 (ref 13–35)

## 2018-11-12 ENCOUNTER — Other Ambulatory Visit (HOSPITAL_COMMUNITY): Payer: Self-pay | Admitting: *Deleted

## 2018-12-04 ENCOUNTER — Encounter: Payer: Self-pay | Admitting: Internal Medicine

## 2018-12-04 ENCOUNTER — Non-Acute Institutional Stay (SKILLED_NURSING_FACILITY): Payer: Medicare Other | Admitting: Internal Medicine

## 2018-12-04 DIAGNOSIS — G301 Alzheimer's disease with late onset: Secondary | ICD-10-CM

## 2018-12-04 DIAGNOSIS — I10 Essential (primary) hypertension: Secondary | ICD-10-CM

## 2018-12-04 DIAGNOSIS — E039 Hypothyroidism, unspecified: Secondary | ICD-10-CM | POA: Diagnosis not present

## 2018-12-04 DIAGNOSIS — F0280 Dementia in other diseases classified elsewhere without behavioral disturbance: Secondary | ICD-10-CM

## 2018-12-04 DIAGNOSIS — E538 Deficiency of other specified B group vitamins: Secondary | ICD-10-CM

## 2018-12-04 DIAGNOSIS — F329 Major depressive disorder, single episode, unspecified: Secondary | ICD-10-CM | POA: Diagnosis not present

## 2018-12-04 NOTE — Progress Notes (Signed)
Location:  [facility] Room Number: 106 Place of Service:  SNF (31) Provider:Gupta Anjali L.MD   [name], MD  Patient Care Team: [name], MD as PCP - General (Internal Medicine) Mast, Man X, NP as Nurse Practitioner (Internal Medicine)  Extended Emergency Contact Information Primary Emergency Contact: [facility] Address: [address]          [place], [place] [address] [name] of [place] Phone: [phone] Relation: Son  Code Status:DNR  Goals of care: Advanced Directive information Advanced Directives 12/04/2018  Does Patient Have a Medical Advance Directive? Yes  Type of Advance Directive Out of facility DNR (pink MOST or yellow form)  Does patient want to make changes to medical advance directive? No - Patient declined  Copy of [place] in Chart? -  Pre-existing out of facility DNR order (yellow form or pink MOST form) Yellow form placed in chart (order not valid for inpatient use)     Chief Complaint  Patient presents with  . Medical Management of Chronic Issues    Routine visit    HPI:  Pt is a 85 y.o. female seen today for medical management of chronic diseases.    Patient has h/o Hypertension, Hypothyroidism, Alzheimer's Dementia with behavior issues, Depression, B12 def  Patient lives in Memory unit D/W nurses she continues to have Behavior issues including resisting care, Trying to exit the facility, aggression towards staff. She has good appetite and weight seems to be stable She was unable to give ma any history. Was sitting quietly in the living room. Denied any acute issues Walks with the walker  Past Medical History:  Diagnosis Date  . Alzheimer disease ([place]) 10/27/2016  . Depression, recurrent ([place]) 10/27/2016  . HTN (hypertension) 10/27/2016  . Hypertension   . Hypertensive kidney disease with CKD (chronic kidney disease) stage V ([place]) 11/02/2016   11/02/16 Na 136, K 4.7, Bun 30,  creat 1.52, TSH 0.86, wbc 6.7, Hgb 11.3, plt 264  . Hypothyroidism 10/27/2016  . Osteoarthritis 10/27/2016  . Thyroid disease    Past Surgical History:  Procedure Laterality Date  . ABDOMINAL HYSTERECTOMY  1986  . KNEE ARTHROSCOPY  2015/2017   Dr. Gustavus Bryant  . SPINE SURGERY  2009, 2011   Dr.Mark Nicoletta Dress    No Known Allergies  Outpatient Encounter Medications as of 12/04/2018  Medication Sig  . acetaminophen (TYLENOL) 325 MG tablet Take 650 mg by mouth every 8 (eight) hours as needed for mild pain.   . cholecalciferol (VITAMIN D) 1000 units tablet Take 1,000 Units by mouth daily.  . cloNIDine (CATAPRES - DOSED IN MG/24 HR) 0.1 mg/24hr patch Place 0.1 mg onto the skin once a week. On thursday  . divalproex (DEPAKOTE SPRINKLE) 125 MG capsule Take 125 mg by mouth 2 (two) times daily.  . DULoxetine (CYMBALTA) 30 MG capsule Take 30 mg by mouth daily.   Marland Kitchen levothyroxine (SYNTHROID, LEVOTHROID) 25 MCG tablet Take 25 mcg by mouth daily before breakfast.  . Melatonin 5 MG TABS Take 5 mg by mouth at bedtime as needed.  . traZODone (DESYREL) 25 mg TABS tablet Take 25 mg by mouth at bedtime.  . vitamin B-12 (CYANOCOBALAMIN) 1000 MCG tablet Take 1,000 mcg by mouth daily.  . vitamin C (ASCORBIC ACID) 500 MG tablet Take 500 mg by mouth daily.  . [DISCONTINUED] dextromethorphan-guaiFENesin (ROBAFEN DM COUGH) 10-100 MG/5ML liquid Take 10 mLs by mouth every 6 (six) hours as needed for cough.   No  facility-administered encounter medications on file as of 12/04/2018.     Review of Systems  Constitutional: Negative.   HENT: Negative.   Respiratory: Negative.   Cardiovascular: Negative.   Genitourinary: Negative.   Musculoskeletal: Negative.   Neurological: Negative.   Psychiatric/Behavioral: Positive for confusion.    Immunization History  Administered Date(s) Administered  . Influenza-Unspecified 05/29/2017  . Pneumococcal Conjugate-13 06/21/2017  . Tdap 06/21/2017   Pertinent  Health Maintenance  Due  Topic Date Due  . PNA vac Low Risk Adult (2 of 2 - PPSV23) 06/21/2018  . INFLUENZA VACCINE  03/09/2019  . DEXA SCAN  Completed   Fall Risk  04/27/2018 04/25/2017  Falls in the past year? No No   Functional Status Survey:    Vitals:   12/04/18 0928  BP: 138/72  Pulse: 72  Resp: 20  Temp: 98.8 F (37.1 C)  SpO2: 96%  Weight: 149 lb 3.2 oz (67.7 kg)   Body mass index is 30.13 kg/m. Physical Exam Vitals signs reviewed.  Constitutional:      Appearance: Normal appearance.  HENT:     Head: Normocephalic.     Nose: Nose normal.     Mouth/Throat:     Mouth: Mucous membranes are moist.     Pharynx: Oropharynx is clear.  Eyes:     Pupils: Pupils are equal, round, and reactive to light.  Neck:     Musculoskeletal: Neck supple.  Cardiovascular:     Rate and Rhythm: Normal rate and regular rhythm.     Pulses: Normal pulses.  Pulmonary:     Effort: Pulmonary effort is normal.     Breath sounds: Normal breath sounds.  Abdominal:     General: Abdomen is flat. Bowel sounds are normal.     Palpations: Abdomen is soft.  Musculoskeletal:        General: No swelling.  Skin:    General: Skin is warm.  Neurological:     General: No focal deficit present.     Mental Status: She is alert.     Comments: Patient was not oriented to place or time. Didn't know her DOB  Psychiatric:        Mood and Affect: Mood normal.        Thought Content: Thought content normal.     Labs reviewed: Recent Labs    02/06/18 08/16/18 10/26/18  NA 143 141 140  K 4.2 4.1 4.3  CL 110 109  --   CO2 25 25  --   BUN 15 12 11   CREATININE 0.9 1.0 0.9  CALCIUM 9.8 9.6  --    Recent Labs    02/06/18 08/16/18 10/26/18  AST 11* 9* 13  ALT 7 7 8   ALKPHOS 76 73  --   PROT  --  5.9  --   ALBUMIN 3.7 3.4  --    Recent Labs    02/06/18 05/01/18 08/16/18 10/26/18  WBC 8.7 8.1 6.5  --   HGB 11.9* 13.7 11.8* 11.7*  HCT 36 41 36 36  PLT 223 244 224 219   Lab Results  Component Value Date    TSH 1.94 08/16/2018   Lab Results  Component Value Date   HGBA1C 5.3 11/01/2016   No results found for: CHOL, HDL, LDLCALC, LDLDIRECT, TRIG, CHOLHDL  Significant Diagnostic Results in last 30 days:  No results found.  Assessment/Plan Essential hypertension On Clonidine patch BP stable She use to refuse PO meds Labs done in 03/20  Acquired hypothyroidism  TSH normal in 01/20  Late onset Alzheimer's disease with behavioral disturbance  On Depakote and Trazadone  Major depression, chronic Stable on Cymbalta  B12 deficiency Continue Supplement Was normal in 03/19    Family/ staff Communication:   Labs/tests ordered:   Total time spent in this patient care encounter was  25_  minutes; greater than 50% of the visit spent counseling patient and staff, reviewing records , Labs and coordinating care for problems addressed at this encounter.

## 2018-12-11 DIAGNOSIS — B351 Tinea unguium: Secondary | ICD-10-CM | POA: Diagnosis not present

## 2018-12-11 DIAGNOSIS — M79672 Pain in left foot: Secondary | ICD-10-CM | POA: Diagnosis not present

## 2018-12-11 DIAGNOSIS — Q6689 Other  specified congenital deformities of feet: Secondary | ICD-10-CM | POA: Diagnosis not present

## 2018-12-11 DIAGNOSIS — M79671 Pain in right foot: Secondary | ICD-10-CM | POA: Diagnosis not present

## 2018-12-20 DIAGNOSIS — M79641 Pain in right hand: Secondary | ICD-10-CM | POA: Diagnosis not present

## 2018-12-26 ENCOUNTER — Encounter: Payer: Self-pay | Admitting: Nurse Practitioner

## 2018-12-26 ENCOUNTER — Non-Acute Institutional Stay (SKILLED_NURSING_FACILITY): Payer: Medicare Other | Admitting: Nurse Practitioner

## 2018-12-26 DIAGNOSIS — G301 Alzheimer's disease with late onset: Secondary | ICD-10-CM | POA: Diagnosis not present

## 2018-12-26 DIAGNOSIS — F5105 Insomnia due to other mental disorder: Secondary | ICD-10-CM | POA: Diagnosis not present

## 2018-12-26 DIAGNOSIS — F329 Major depressive disorder, single episode, unspecified: Secondary | ICD-10-CM

## 2018-12-26 DIAGNOSIS — F99 Mental disorder, not otherwise specified: Secondary | ICD-10-CM | POA: Diagnosis not present

## 2018-12-26 DIAGNOSIS — F0280 Dementia in other diseases classified elsewhere without behavioral disturbance: Secondary | ICD-10-CM

## 2018-12-26 DIAGNOSIS — E039 Hypothyroidism, unspecified: Secondary | ICD-10-CM

## 2018-12-26 DIAGNOSIS — I10 Essential (primary) hypertension: Secondary | ICD-10-CM | POA: Diagnosis not present

## 2018-12-26 NOTE — Assessment & Plan Note (Signed)
Her behaviors are stabilized, continue Divalproex 125mg  bid, continue memory care unit [facility] for safety and care assistance.

## 2018-12-26 NOTE — Assessment & Plan Note (Signed)
Stable, last TSH wnl 1.94 10/26/27, continue Levothyroxine 75mcg qd.

## 2018-12-26 NOTE — Progress Notes (Signed)
Location:   SNF [facility] Room Number: 106/A Place of Service:  SNF (31) Provider: [facility] Darnice Comrie NP  [name], MD  Patient Care Team: [name], MD as PCP - General (Internal Medicine) Georgeanne Frankland X, NP as Nurse Practitioner (Internal Medicine)  Extended Emergency Contact Information Primary Emergency Contact: [facility] Address: [address]          [place], [place] [address] [name] of [place] Phone: [phone] Relation: Son  Code Status:  DNR Goals of care: Advanced Directive information Advanced Directives 12/26/2018  Does Patient Have a Medical Advance Directive? Yes  Type of Advance Directive Out of facility DNR (pink MOST or yellow form);Healthcare Power of Attorney  Does patient want to make changes to medical advance directive? No - Patient declined  Copy of [place] in Chart? Yes - validated most recent copy scanned in chart (See row information)  Pre-existing out of facility DNR order (yellow form or pink MOST form) Yellow form placed in chart (order not valid for inpatient use)     Chief Complaint  Patient presents with  . Medical Management of Chronic Issues    Routine visit   . Health Maintenance    PPSV23    HPI:  Pt is a 85 y.o. female seen today for medical management of chronic diseases.    The patient has history of HTN, blood pressure is controlled on Clonidine 0.1mg /24hr patch. Her mood is stabilized on Duloxetine 30mg  qd, Trazodone 25mg  qd, she resides in memory care unit, [facility], ambulates with walker, on Divalproex 125mg  bid for behaviors. Hx of hypothyroidism, on Levothyroxine 16mcg qd, last TSH 1.94 10/26/18  Past Medical History:  Diagnosis Date  . Alzheimer disease ([place]) 10/27/2016  . Depression, recurrent ([place]) 10/27/2016  . HTN (hypertension) 10/27/2016  . Hypertension   . Hypertensive kidney disease with CKD (chronic kidney disease) stage V ([place]) 11/02/2016   11/02/16 Na 136, K 4.7, Bun 30, creat  1.52, TSH 0.86, wbc 6.7, Hgb 11.3, plt 264  . Hypothyroidism 10/27/2016  . Osteoarthritis 10/27/2016  . Thyroid disease    Past Surgical History:  Procedure Laterality Date  . ABDOMINAL HYSTERECTOMY  1986  . KNEE ARTHROSCOPY  2015/2017   Dr. Gustavus Bryant  . SPINE SURGERY  2009, 2011   Dr.Mark Nicoletta Dress    No Known Allergies  Allergies as of 12/26/2018   No Known Allergies     Medication List       Accurate as of Dec 26, 2018  3:45 PM. If you have any questions, ask your nurse or doctor.        acetaminophen 325 MG tablet Commonly known as:  TYLENOL Take 650 mg by mouth every 8 (eight) hours as needed for mild pain.   cholecalciferol 25 MCG (1000 UT) tablet Commonly known as:  VITAMIN D Take 1,000 Units by mouth daily.   cloNIDine 0.1 mg/24hr patch Commonly known as:  CATAPRES - Dosed in mg/24 hr Place 0.1 mg onto the skin once a week. On thursday   divalproex 125 MG capsule Commonly known as:  DEPAKOTE SPRINKLE Take 125 mg by mouth 2 (two) times daily.   DULoxetine 30 MG capsule Commonly known as:  CYMBALTA Take 30 mg by mouth daily.   levothyroxine 25 MCG tablet Commonly known as:  SYNTHROID Take 25 mcg by mouth daily before breakfast.   Melatonin 5 MG Tabs Take 5 mg by mouth at bedtime as needed.   traZODone 50 MG tablet  Commonly known as:  DESYREL Take 25 mg by mouth at bedtime. What changed:  Another medication with the same name was removed. Continue taking this medication, and follow the directions you see here. Changed by:  Leron Stoffers X Kyrstan Gotwalt, NP   vitamin B-12 1000 MCG tablet Commonly known as:  CYANOCOBALAMIN Take 1,000 mcg by mouth daily.   vitamin C 500 MG tablet Commonly known as:  ASCORBIC ACID Take 500 mg by mouth daily.      ROS was provided with assistance of staff Review of Systems  Constitutional: Negative for activity change, appetite change, chills, diaphoresis, fatigue, fever and unexpected weight change.  HENT: Positive for hearing loss.  Negative for congestion and voice change.   Respiratory: Negative for cough, shortness of breath and wheezing.   Cardiovascular: Negative for chest pain, palpitations and leg swelling.  Gastrointestinal: Negative for abdominal distention, abdominal pain, constipation, diarrhea, nausea and vomiting.  Genitourinary: Negative for difficulty urinating, dysuria and urgency.  Musculoskeletal: Positive for gait problem.  Skin: Negative for color change and pallor.  Neurological: Negative for dizziness, speech difficulty, weakness and headaches.       Dementia  Psychiatric/Behavioral: Positive for confusion. Negative for agitation, behavioral problems, hallucinations and sleep disturbance. The patient is not nervous/anxious.     Immunization History  Administered Date(s) Administered  . Influenza-Unspecified 05/29/2017  . Pneumococcal Conjugate-13 06/21/2017  . Tdap 06/21/2017   Pertinent  Health Maintenance Due  Topic Date Due  . PNA vac Low Risk Adult (2 of 2 - PPSV23) 06/21/2018  . INFLUENZA VACCINE  03/09/2019  . DEXA SCAN  Completed   Fall Risk  04/27/2018 04/25/2017  Falls in the past year? No No   Functional Status Survey:    Vitals:   12/26/18 0916  BP: (!) 152/88  Pulse: 72  Resp: 16  Temp: 98.3 F (36.8 C)  SpO2: 96%  Weight: 150 lb 12.8 oz (68.4 kg)   Body mass index is 30.46 kg/m. Physical Exam Vitals signs and nursing note reviewed.  Constitutional:      General: She is not in acute distress.    Appearance: Normal appearance. She is obese. She is not toxic-appearing or diaphoretic.  HENT:     Head: Normocephalic and atraumatic.     Nose: Nose normal. No congestion or rhinorrhea.     Mouth/Throat:     Mouth: Mucous membranes are moist.  Eyes:     Extraocular Movements: Extraocular movements intact.     Conjunctiva/sclera: Conjunctivae normal.     Pupils: Pupils are equal, round, and reactive to light.  Neck:     Musculoskeletal: Normal range of motion and  neck supple.  Cardiovascular:     Rate and Rhythm: Normal rate and regular rhythm.     Heart sounds: No murmur.  Pulmonary:     Effort: Pulmonary effort is normal.     Breath sounds: No wheezing, rhonchi or rales.  Abdominal:     General: There is no distension.     Palpations: Abdomen is soft.     Tenderness: There is no abdominal tenderness. There is no right CVA tenderness, left CVA tenderness, guarding or rebound.  Musculoskeletal:     Right lower leg: No edema.     Left lower leg: No edema.  Skin:    General: Skin is warm and dry.  Neurological:     General: No focal deficit present.     Mental Status: She is alert. Mental status is at baseline.  Motor: No weakness.     Coordination: Coordination normal.     Gait: Gait abnormal.     Deep Tendon Reflexes: Reflexes normal.     Comments: Oriented to self. Ambulates with walker.   Psychiatric:        Mood and Affect: Mood normal.        Behavior: Behavior normal.     Labs reviewed: Recent Labs    02/06/18 08/16/18 10/26/18  NA 143 141 140  K 4.2 4.1 4.3  CL 110 109  --   CO2 25 25  --   BUN 15 12 11   CREATININE 0.9 1.0 0.9  CALCIUM 9.8 9.6  --    Recent Labs    02/06/18 08/16/18 10/26/18  AST 11* 9* 13  ALT 7 7 8   ALKPHOS 76 73  --   PROT  --  5.9  --   ALBUMIN 3.7 3.4  --    Recent Labs    02/06/18 05/01/18 08/16/18 10/26/18  WBC 8.7 8.1 6.5  --   HGB 11.9* 13.7 11.8* 11.7*  HCT 36 41 36 36  PLT 223 244 224 219   Lab Results  Component Value Date   TSH 1.94 08/16/2018   Lab Results  Component Value Date   HGBA1C 5.3 11/01/2016   No results found for: CHOL, HDL, LDLCALC, LDLDIRECT, TRIG, CHOLHDL  Significant Diagnostic Results in last 30 days:  No results found.  Assessment/Plan  HTN (hypertension) Blood pressure is controlled, continue Clonidine patch 0.1mg /24hrs.   Hypothyroidism Stable, last TSH wnl 1.94 10/26/27, continue Levothyroxine 4mcg qd.   Alzheimer disease ([place]) Her  behaviors are stabilized, continue Divalproex 125mg  bid, continue memory care unit [facility] for safety and care assistance.   Insomnia Sleeps well, continue Trazodone 25mg  qhs.   Major depression, chronic Her mood is stabilized, continue Duloxetine 30mg  qd.    Family/ staff Communication: plan of care reviewed with the patient and charge nurse.   Labs/tests ordered: none  Time spend 25 minutes.

## 2018-12-26 NOTE — Assessment & Plan Note (Signed)
Her mood is stabilized, continue Duloxetine 30mg  qd.

## 2018-12-26 NOTE — Assessment & Plan Note (Signed)
Blood pressure is controlled, continue Clonidine patch 0.1mg /24hrs.

## 2018-12-26 NOTE — Assessment & Plan Note (Signed)
Sleeps well, continue Trazodone 25mg  qhs.

## 2019-01-30 ENCOUNTER — Encounter: Payer: Self-pay | Admitting: Nurse Practitioner

## 2019-01-30 ENCOUNTER — Non-Acute Institutional Stay (SKILLED_NURSING_FACILITY): Payer: Medicare Other | Admitting: Nurse Practitioner

## 2019-01-30 DIAGNOSIS — E039 Hypothyroidism, unspecified: Secondary | ICD-10-CM | POA: Diagnosis not present

## 2019-01-30 DIAGNOSIS — F329 Major depressive disorder, single episode, unspecified: Secondary | ICD-10-CM | POA: Diagnosis not present

## 2019-01-30 DIAGNOSIS — M159 Polyosteoarthritis, unspecified: Secondary | ICD-10-CM

## 2019-01-30 DIAGNOSIS — E538 Deficiency of other specified B group vitamins: Secondary | ICD-10-CM | POA: Diagnosis not present

## 2019-01-30 DIAGNOSIS — M150 Primary generalized (osteo)arthritis: Secondary | ICD-10-CM

## 2019-01-30 DIAGNOSIS — I10 Essential (primary) hypertension: Secondary | ICD-10-CM

## 2019-01-30 DIAGNOSIS — F0280 Dementia in other diseases classified elsewhere without behavioral disturbance: Secondary | ICD-10-CM

## 2019-01-30 DIAGNOSIS — G308 Other Alzheimer's disease: Secondary | ICD-10-CM

## 2019-01-30 NOTE — Assessment & Plan Note (Signed)
Continue memory care unit [facility] for safety and care assistance, ambulates with walker, she is resistant to assistance or redirections occasionally.

## 2019-01-30 NOTE — Progress Notes (Signed)
Location:  [place] Room Number: [place] of Service:  SNF (31) Provider:   Otho Darner, NP  [name], MD  Patient Care Team: [name], MD as PCP - General (Internal Medicine) ,  X, NP as Nurse Practitioner (Internal Medicine)  Extended Emergency Contact Information Primary Emergency Contact: [facility] Address: [address]          [place], [place] [address] [name] of [place] Phone: [phone] Relation: Son  Code Status:  DNR  Goals of care: Advanced Directive information Advanced Directives 01/30/2019  Does Patient Have a Medical Advance Directive? Yes  Type of Advance Directive Out of facility DNR (pink MOST or yellow form);Healthcare Power of Attorney  Does patient want to make changes to medical advance directive? No - Patient declined  Copy of [place] in Chart? Yes - validated most recent copy scanned in chart (See row information)  Pre-existing out of facility DNR order (yellow form or pink MOST form) Yellow form placed in chart (order not valid for inpatient use)     Chief Complaint  Patient presents with  . Medical agement of Chronic Issues    Routine visit    HPI:  Pt is a 85 y.o. female seen today for medical management of chronic diseases.    The patient resides in memory care unit, [facility] for safety and care assistance, ambulates with walker, occasion c/o lower back pain, prn Tylenol is available to her. Hx of HTN, blood pressure is controlled on Clonidine 0.mg/24hr patch. Her mood is stable, on Depakote 125mg  bid, Duloxetine 30mg  qd, Trazodone 25mg  qd. Hx of hypothyroidism, on Levothyroxine 48mcg qd, last TSH 1.94 08/16/18.  Anemia on Vit B12 1045mcg qd, last Hgb 11.7 10/26/18.    Past Medical History:  Diagnosis Date  . Alzheimer disease ([place]) 10/27/2016  . Depression, recurrent ([place]) 10/27/2016  . HTN (hypertension) 10/27/2016  . Hypertension   . Hypertensive kidney disease with  CKD (chronic kidney disease) stage V ([place]) 11/02/2016   11/02/16 Na 136, K 4.7, Bun 30, creat 1.52, TSH 0.86, wbc 6.7, Hgb 11.3, plt 264  . Hypothyroidism 10/27/2016  . Osteoarthritis 10/27/2016  . Thyroid disease    Past Surgical History:  Procedure Laterality Date  . ABDOMINAL HYSTERECTOMY  1986  . KNEE ARTHROSCOPY  2015/2017   Dr. Gustavus Bryant  . SPINE SURGERY  2009, 2011   Dr.Mark Nicoletta Dress    No Known Allergies  Outpatient Encounter Medications as of 01/30/2019  Medication Sig  . acetaminophen (TYLENOL) 325 MG tablet Take 650 mg by mouth every 8 (eight) hours as needed for mild pain.   . cholecalciferol (VITAMIN D) 1000 units tablet Take 1,000 Units by mouth daily.  . cloNIDine (CATAPRES - DOSED IN MG/24 HR) 0.1 mg/24hr patch Place 0.1 mg onto the skin once a week. On thursday  . divalproex (DEPAKOTE SPRINKLE) 125 MG capsule Take 125 mg by mouth 2 (two) times daily.  . DULoxetine (CYMBALTA) 30 MG capsule Take 30 mg by mouth daily.   Marland Kitchen levothyroxine (SYNTHROID, LEVOTHROID) 25 MCG tablet Take 25 mcg by mouth daily before breakfast.  . Melatonin 5 MG TABS Take 5 mg by mouth at bedtime as needed.  . traZODone (DESYREL) 50 MG tablet Take 25 mg by mouth at bedtime.  . vitamin B-12 (CYANOCOBALAMIN) 1000 MCG tablet Take 1,000 mcg by mouth daily.  . vitamin C (ASCORBIC ACID) 500 MG tablet Take 500 mg by mouth daily.   No facility-administered  encounter medications on file as of 01/30/2019.    ROS was provided with assistance of staff Review of Systems  Constitutional: Negative for activity change, appetite change, chills, diaphoresis, fatigue and fever.  HENT: Positive for hearing loss and voice change. Negative for congestion.   Eyes: Negative for visual disturbance.  Respiratory: Negative for cough, shortness of breath and wheezing.   Cardiovascular: Negative for chest pain and palpitations.  Gastrointestinal: Negative for abdominal distention, constipation, diarrhea, nausea and vomiting.   Genitourinary: Negative for difficulty urinating, dysuria and urgency.  Musculoskeletal: Positive for arthralgias, back pain and gait problem.  Skin: Negative for color change and pallor.  Neurological: Negative for dizziness, speech difficulty and headaches.       Dementia  Psychiatric/Behavioral: Positive for confusion and sleep disturbance. Negative for agitation, behavioral problems and hallucinations. The patient is not nervous/anxious.     Immunization History  Administered Date(s) Administered  . Influenza-Unspecified 05/29/2017  . Pneumococcal Conjugate-13 06/21/2017  . Tdap 06/21/2017   Pertinent  Health Maintenance Due  Topic Date Due  . PNA vac Low Risk Adult (2 of 2 - PPSV23) 06/21/2018  . INFLUENZA VACCINE  03/09/2019  . DEXA SCAN  Completed   Fall Risk  04/27/2018 04/25/2017  Falls in the past year? No No   Functional Status Survey:    Vitals:   01/30/19 0930  BP: (!) 148/80  Pulse: 72  Resp: 18  Temp: 98.9 F (37.2 C)  TempSrc: Oral  SpO2: 97%  Weight: 148 lb 3.2 oz (67.2 kg)  Height: 4\' 11"  (1.499 m)   Body mass index is 29.93 kg/m. Physical Exam Vitals signs and nursing note reviewed.  Constitutional:      General: She is not in acute distress.    Appearance: Normal appearance. She is not ill-appearing, toxic-appearing or diaphoretic.     Comments: Over weight   HENT:     Head: Normocephalic and atraumatic.     Nose: Nose normal. No congestion or rhinorrhea.     Mouth/Throat:     Mouth: Mucous membranes are moist.  Eyes:     Extraocular Movements: Extraocular movements intact.     Conjunctiva/sclera: Conjunctivae normal.     Pupils: Pupils are equal, round, and reactive to light.  Neck:     Musculoskeletal: Normal range of motion and neck supple.  Cardiovascular:     Rate and Rhythm: Normal rate and regular rhythm.     Heart sounds: No murmur.  Pulmonary:     Effort: Pulmonary effort is normal.     Breath sounds: No wheezing, rhonchi or  rales.  Abdominal:     General: Bowel sounds are normal. There is no distension.     Palpations: Abdomen is soft.     Tenderness: There is no abdominal tenderness. There is no right CVA tenderness, left CVA tenderness, guarding or rebound.  Musculoskeletal:        General: No swelling or tenderness.     Comments: Ambulates with walker.   Skin:    General: Skin is warm and dry.  Neurological:     General: No focal deficit present.     Mental Status: She is alert. Mental status is at baseline.     Cranial Nerves: No cranial nerve deficit.     Motor: No weakness.     Coordination: Coordination normal.     Gait: Gait abnormal.     Comments: Oriented to self.   Psychiatric:        Mood and  Affect: Mood normal.        Behavior: Behavior normal.     Labs reviewed: Recent Labs    02/06/18 08/16/18 10/26/18  NA 143 141 140  K 4.2 4.1 4.3  CL 110 109  --   CO2 25 25  --   BUN 15 12 11   CREATININE 0.9 1.0 0.9  CALCIUM 9.8 9.6  --    Recent Labs    02/06/18 08/16/18 10/26/18  AST 11* 9* 13  ALT 7 7 8   ALKPHOS 76 73  --   PROT  --  5.9  --   ALBUMIN 3.7 3.4  --    Recent Labs    02/06/18 05/01/18 08/16/18 10/26/18  WBC 8.7 8.1 6.5  --   HGB 11.9* 13.7 11.8* 11.7*  HCT 36 41 36 36  PLT 223 244 224 219   Lab Results  Component Value Date   TSH 1.94 08/16/2018   Lab Results  Component Value Date   HGBA1C 5.3 11/01/2016    Significant Diagnostic Results in last 30 days:   Assessment/Plan HTN (hypertension) Blood pressure is controlled, continue Clonidine patch 0.1mg /24hr.   Hypothyroidism Stable, last TSH 1.94 08/2018, continue Levothyroxine 69mcg qd.   Alzheimer disease ([place]) Continue memory care unit [facility] for safety and care assistance, ambulates with walker, she is resistant to assistance or redirections occasionally.   Osteoarthritis Lower back pain sometimes, continue prn Tylenol   B12 deficiency Stable, continue Vit B12 1063mcg qd, last Hgb 11.7 10/26/18   Major depression, chronic Her mood is stable, continue Cymbalta 30mg  qd, Trazodone 25mg  qd, Depakote 125mg  bid.      Family/ staff Communication: plan of care reviewed with the patient and charge nurse.   Labs/tests ordered:  none  Time spend 25 minutes.

## 2019-01-30 NOTE — Assessment & Plan Note (Signed)
Stable, last TSH 1.94 08/2018, continue Levothyroxine 98mcg qd.

## 2019-01-30 NOTE — Assessment & Plan Note (Signed)
Blood pressure is controlled, continue Clonidine patch 0.1mg /24hr.

## 2019-01-30 NOTE — Assessment & Plan Note (Signed)
Lower back pain sometimes, continue prn Tylenol

## 2019-01-30 NOTE — Assessment & Plan Note (Signed)
Stable, continue Vit B12 1035mcg qd, last Hgb 11.7 10/26/18

## 2019-01-30 NOTE — Assessment & Plan Note (Signed)
Her mood is stable, continue Cymbalta 30mg  qd, Trazodone 25mg  qd, Depakote 125mg  bid.

## 2019-03-13 DIAGNOSIS — Z1159 Encounter for screening for other viral diseases: Secondary | ICD-10-CM | POA: Diagnosis not present

## 2019-03-20 DIAGNOSIS — Z1159 Encounter for screening for other viral diseases: Secondary | ICD-10-CM | POA: Diagnosis not present

## 2019-03-26 ENCOUNTER — Encounter: Payer: Self-pay | Admitting: Internal Medicine

## 2019-03-26 ENCOUNTER — Non-Acute Institutional Stay (SKILLED_NURSING_FACILITY): Payer: Medicare Other | Admitting: Internal Medicine

## 2019-03-26 DIAGNOSIS — I10 Essential (primary) hypertension: Secondary | ICD-10-CM | POA: Diagnosis not present

## 2019-03-26 DIAGNOSIS — E039 Hypothyroidism, unspecified: Secondary | ICD-10-CM

## 2019-03-26 DIAGNOSIS — F0280 Dementia in other diseases classified elsewhere without behavioral disturbance: Secondary | ICD-10-CM | POA: Diagnosis not present

## 2019-03-26 DIAGNOSIS — G308 Other Alzheimer's disease: Secondary | ICD-10-CM

## 2019-03-26 DIAGNOSIS — E538 Deficiency of other specified B group vitamins: Secondary | ICD-10-CM

## 2019-03-26 NOTE — Progress Notes (Signed)
Location:  [place] Room Number: 106 Place of Service:  SNF [phone]) Provider:  Veleta Miners  MD  [name], MD  Patient Care Team: [name], MD as PCP - General (Internal Medicine) Mast, Man X, NP as Nurse Practitioner (Internal Medicine)  Extended Emergency Contact Information Primary Emergency Contact: [facility] Address: [address]          [place], [place] [address] [name] of [place] Phone: [phone] Relation: Son  Code Status:  DNR Goals of care: Advanced Directive information Advanced Directives 03/26/2019  Does Patient Have a Medical Advance Directive? Yes  Type of Paramedic of [place];Out of facility DNR (pink MOST or yellow form)  Does patient want to make changes to medical advance directive? No - Patient declined  Copy of [place] in Chart? Yes - validated most recent copy scanned in chart (See row information)  Pre-existing out of facility DNR order (yellow form or pink MOST form) Yellow form placed in chart (order not valid for inpatient use)     Chief Complaint  Patient presents with  . Medical Management of Chronic Issues    HPI:  Pt is a 85 y.o. female seen today for medical management of chronic diseases.    Patient has h/o Hypertension, Hypothyroidism, Alzheimer's Dementia with behavior issues, Depression, B12 def Patient Lives in Memory unit. Continues to have behavior issues.  Her weight is stable. Today was very Calm and was answering appropriately No new issues  Past Medical History:  Diagnosis Date  . Alzheimer disease ([place]) 10/27/2016  . Depression, recurrent ([place]) 10/27/2016  . HTN (hypertension) 10/27/2016  . Hypertension   . Hypertensive kidney disease with CKD (chronic kidney disease) stage V ([place]) 11/02/2016   11/02/16 Na 136, K 4.7, Bun 30, creat 1.52, TSH 0.86, wbc 6.7, Hgb 11.3, plt 264  . Hypothyroidism 10/27/2016  . Osteoarthritis  10/27/2016  . Thyroid disease    Past Surgical History:  Procedure Laterality Date  . ABDOMINAL HYSTERECTOMY  1986  . KNEE ARTHROSCOPY  2015/2017   Dr. Gustavus Bryant  . SPINE SURGERY  2009, 2011   Dr.Mark Nicoletta Dress    No Known Allergies  Outpatient Encounter Medications as of 03/26/2019  Medication Sig  . acetaminophen (TYLENOL) 325 MG tablet Take 650 mg by mouth every 8 (eight) hours as needed for mild pain.   . cholecalciferol (VITAMIN D) 1000 units tablet Take 1,000 Units by mouth daily.  . cloNIDine (CATAPRES - DOSED IN MG/24 HR) 0.1 mg/24hr patch Place 0.1 mg onto the skin once a week. On Monday. REMOVE OLD PATCH BEFORE APPLYING NEW PATCH AND ROTATE SITES  . divalproex (DEPAKOTE SPRINKLE) 125 MG capsule Take 125 mg by mouth 2 (two) times daily.  . DULoxetine (CYMBALTA) 30 MG capsule Take 30 mg by mouth daily.   Marland Kitchen levothyroxine (SYNTHROID, LEVOTHROID) 25 MCG tablet Take 25 mcg by mouth daily before breakfast.  . Melatonin 5 MG TABS Take 5 mg by mouth at bedtime as needed.  . traZODone (DESYREL) 50 MG tablet Take 25 mg by mouth at bedtime.  . vitamin B-12 (CYANOCOBALAMIN) 1000 MCG tablet Take 1,000 mcg by mouth daily.  . vitamin C (ASCORBIC ACID) 500 MG tablet Take 500 mg by mouth daily.   No facility-administered encounter medications on file as of 03/26/2019.     Review of Systems  Review of Systems  Constitutional: Negative for activity change, appetite change, chills, diaphoresis, fatigue and fever.  HENT:  Negative for mouth sores, postnasal drip, rhinorrhea, sinus pain and sore throat.   Respiratory: Negative for apnea, cough, chest tightness, shortness of breath and wheezing.   Cardiovascular: Negative for chest pain, palpitations and leg swelling.  Gastrointestinal: Negative for abdominal distention, abdominal pain, constipation, diarrhea, nausea and vomiting.  Genitourinary: Negative for dysuria and frequency.  Musculoskeletal: Negative for arthralgias, joint swelling and myalgias.   Skin: Negative for rash.  Neurological: Negative for dizziness, syncope, weakness, light-headedness and numbness.  Psychiatric/Behavioral: Negative for behavioral problems, confusion and sleep disturbance.     Immunization History  Administered Date(s) Administered  . Influenza-Unspecified 05/29/2017  . Pneumococcal Conjugate-13 06/21/2017  . Tdap 06/21/2017   Pertinent  Health Maintenance Due  Topic Date Due  . PNA vac Low Risk Adult (2 of 2 - PPSV23) 06/21/2018  . INFLUENZA VACCINE  03/09/2019  . DEXA SCAN  Completed   Fall Risk  04/27/2018 04/25/2017  Falls in the past year? No No   Functional Status Survey:    Vitals:   03/26/19 0950  BP: (!) 152/88  Pulse: 72  Resp: 18  Temp: 98.1 F (36.7 C)  SpO2: 96%  Weight: 149 lb 12.8 oz (67.9 kg)  Height: 4\' 11"  (1.499 m)   Body mass index is 30.26 kg/m. Physical Exam  Constitutional:  Well-developed and well-nourished.  HENT:  Head: Normocephalic.  Mouth/Throat: Oropharynx is clear and moist.  Eyes: Pupils are equal, round, and reactive to light.  Neck: Neck supple.  Cardiovascular: Normal rate and normal heart sounds.  No murmur heard. Pulmonary/Chest: Effort normal and breath sounds normal. No respiratory distress. No wheezes. She has no rales.  Abdominal: Soft. Bowel sounds are normal. No distension. There is no tenderness. There is no rebound.  Musculoskeletal:Mild edema Lymphadenopathy: none Neurological: No Focal Deficits. Was walking with her walker Skin: Skin is warm and dry.  Psychiatric: Normal mood and affect. Behavior is normal. Thought content normal.    Labs reviewed: Recent Labs    08/16/18 10/26/18  NA 141 140  K 4.1 4.3  CL 109  --   CO2 25  --   BUN 12 11  CREATININE 1.0 0.9  CALCIUM 9.6  --    Recent Labs    08/16/18 10/26/18  AST 9* 13  ALT 7 8  ALKPHOS 73  --   PROT 5.9  --   ALBUMIN 3.4  --    Recent Labs    05/01/18 08/16/18 10/26/18  WBC 8.1 6.5  --   HGB 13.7 11.8* 11.7*   HCT 41 36 36  PLT 244 224 219   Lab Results  Component Value Date   TSH 1.94 08/16/2018   Lab Results  Component Value Date   HGBA1C 5.3 11/01/2016   No results found for: CHOL, HDL, LDLCALC, LDLDIRECT, TRIG, CHOLHDL  Significant Diagnostic Results in last 30 days:  No results found.  Assessment/Plan Essential hypertension On Clonidine Patch as she refuses Meds sometimes  Acquired hypothyroidism TSH Normal in 01/20  Late onset Alzheimer's disease with behavioral disturbance  On Depakote and Trazadone  Major depression, chronic On Cymbalta  B12 deficiency On supplement  Family/ staff Communication:   Labs/tests ordered:  Labs Next visit  Total time spent in this patient care encounter was  25_  minutes; greater than 50% of the visit spent counseling patient and staff, reviewing records , Labs and coordinating care for problems addressed at this encounter.

## 2019-04-03 ENCOUNTER — Other Ambulatory Visit: Payer: Self-pay

## 2019-04-03 ENCOUNTER — Inpatient Hospital Stay (HOSPITAL_COMMUNITY)
Admission: EM | Admit: 2019-04-03 | Discharge: 2019-04-07 | DRG: 482 | Disposition: A | Discharge status: Skilled Nursing Facility | Payer: Medicare Other | Attending: Internal Medicine | Admitting: Internal Medicine

## 2019-04-03 DIAGNOSIS — S72141A Displaced intertrochanteric fracture of right femur, initial encounter for closed fracture: Principal | ICD-10-CM

## 2019-04-03 DIAGNOSIS — Z20828 Contact with and (suspected) exposure to other viral communicable diseases: Secondary | ICD-10-CM | POA: Diagnosis not present

## 2019-04-03 DIAGNOSIS — F339 Major depressive disorder, recurrent, unspecified: Secondary | ICD-10-CM

## 2019-04-03 DIAGNOSIS — W010XXA Fall on same level from slipping, tripping and stumbling without subsequent striking against object, initial encounter: Secondary | ICD-10-CM | POA: Diagnosis present

## 2019-04-03 DIAGNOSIS — R0902 Hypoxemia: Secondary | ICD-10-CM | POA: Diagnosis not present

## 2019-04-03 DIAGNOSIS — Z96651 Presence of right artificial knee joint: Secondary | ICD-10-CM | POA: Diagnosis present

## 2019-04-03 DIAGNOSIS — Z79899 Other long term (current) drug therapy: Secondary | ICD-10-CM

## 2019-04-03 DIAGNOSIS — I10 Essential (primary) hypertension: Secondary | ICD-10-CM | POA: Diagnosis not present

## 2019-04-03 DIAGNOSIS — F0390 Unspecified dementia without behavioral disturbance: Secondary | ICD-10-CM | POA: Diagnosis present

## 2019-04-03 DIAGNOSIS — R52 Pain, unspecified: Secondary | ICD-10-CM | POA: Diagnosis not present

## 2019-04-03 DIAGNOSIS — F329 Major depressive disorder, single episode, unspecified: Secondary | ICD-10-CM | POA: Diagnosis present

## 2019-04-03 DIAGNOSIS — Z8781 Personal history of (healed) traumatic fracture: Secondary | ICD-10-CM | POA: Diagnosis present

## 2019-04-03 DIAGNOSIS — W19XXXA Unspecified fall, initial encounter: Secondary | ICD-10-CM

## 2019-04-03 DIAGNOSIS — S0990XA Unspecified injury of head, initial encounter: Secondary | ICD-10-CM | POA: Diagnosis not present

## 2019-04-03 DIAGNOSIS — D72829 Elevated white blood cell count, unspecified: Secondary | ICD-10-CM | POA: Diagnosis not present

## 2019-04-03 DIAGNOSIS — M25551 Pain in right hip: Secondary | ICD-10-CM | POA: Diagnosis not present

## 2019-04-03 DIAGNOSIS — S72142A Displaced intertrochanteric fracture of left femur, initial encounter for closed fracture: Secondary | ICD-10-CM

## 2019-04-03 DIAGNOSIS — E039 Hypothyroidism, unspecified: Secondary | ICD-10-CM

## 2019-04-03 DIAGNOSIS — I447 Left bundle-branch block, unspecified: Secondary | ICD-10-CM | POA: Diagnosis not present

## 2019-04-03 DIAGNOSIS — Z419 Encounter for procedure for purposes other than remedying health state, unspecified: Secondary | ICD-10-CM

## 2019-04-03 DIAGNOSIS — Z79890 Hormone replacement therapy (postmenopausal): Secondary | ICD-10-CM

## 2019-04-03 DIAGNOSIS — S72009A Fracture of unspecified part of neck of unspecified femur, initial encounter for closed fracture: Secondary | ICD-10-CM

## 2019-04-03 DIAGNOSIS — S299XXA Unspecified injury of thorax, initial encounter: Secondary | ICD-10-CM | POA: Diagnosis not present

## 2019-04-03 DIAGNOSIS — S72001A Fracture of unspecified part of neck of right femur, initial encounter for closed fracture: Secondary | ICD-10-CM

## 2019-04-03 DIAGNOSIS — M1990 Unspecified osteoarthritis, unspecified site: Secondary | ICD-10-CM | POA: Diagnosis present

## 2019-04-03 DIAGNOSIS — S199XXA Unspecified injury of neck, initial encounter: Secondary | ICD-10-CM | POA: Diagnosis not present

## 2019-04-03 DIAGNOSIS — I4430 Unspecified atrioventricular block: Secondary | ICD-10-CM | POA: Diagnosis not present

## 2019-04-03 DIAGNOSIS — Y92129 Unspecified place in nursing home as the place of occurrence of the external cause: Secondary | ICD-10-CM

## 2019-04-03 DIAGNOSIS — Z03818 Encounter for observation for suspected exposure to other biological agents ruled out: Secondary | ICD-10-CM | POA: Diagnosis not present

## 2019-04-03 DIAGNOSIS — S3992XA Unspecified injury of lower back, initial encounter: Secondary | ICD-10-CM | POA: Diagnosis not present

## 2019-04-03 HISTORY — DX: Unspecified osteoarthritis, unspecified site: M19.90

## 2019-04-04 ENCOUNTER — Encounter (HOSPITAL_COMMUNITY): Payer: Self-pay | Admitting: Emergency Medicine

## 2019-04-04 ENCOUNTER — Other Ambulatory Visit: Payer: Self-pay

## 2019-04-04 ENCOUNTER — Inpatient Hospital Stay (HOSPITAL_COMMUNITY): Payer: Medicare Other

## 2019-04-04 ENCOUNTER — Emergency Department (HOSPITAL_COMMUNITY): Payer: Medicare Other

## 2019-04-04 ENCOUNTER — Inpatient Hospital Stay (HOSPITAL_COMMUNITY): Payer: Medicare Other | Admitting: Certified Registered"

## 2019-04-04 ENCOUNTER — Encounter (HOSPITAL_COMMUNITY)
Admission: EM | Disposition: A | Discharge status: Skilled Nursing Facility | Payer: Self-pay | Source: Home / Self Care | Attending: Internal Medicine

## 2019-04-04 DIAGNOSIS — S72001A Fracture of unspecified part of neck of right femur, initial encounter for closed fracture: Secondary | ICD-10-CM

## 2019-04-04 DIAGNOSIS — S72141A Displaced intertrochanteric fracture of right femur, initial encounter for closed fracture: Secondary | ICD-10-CM | POA: Diagnosis present

## 2019-04-04 DIAGNOSIS — G309 Alzheimer's disease, unspecified: Secondary | ICD-10-CM | POA: Diagnosis not present

## 2019-04-04 DIAGNOSIS — M6281 Muscle weakness (generalized): Secondary | ICD-10-CM | POA: Diagnosis not present

## 2019-04-04 DIAGNOSIS — M25551 Pain in right hip: Secondary | ICD-10-CM | POA: Diagnosis not present

## 2019-04-04 DIAGNOSIS — S299XXA Unspecified injury of thorax, initial encounter: Secondary | ICD-10-CM | POA: Diagnosis not present

## 2019-04-04 DIAGNOSIS — Z7401 Bed confinement status: Secondary | ICD-10-CM | POA: Diagnosis not present

## 2019-04-04 DIAGNOSIS — F99 Mental disorder, not otherwise specified: Secondary | ICD-10-CM | POA: Diagnosis not present

## 2019-04-04 DIAGNOSIS — F5105 Insomnia due to other mental disorder: Secondary | ICD-10-CM | POA: Diagnosis not present

## 2019-04-04 DIAGNOSIS — R41841 Cognitive communication deficit: Secondary | ICD-10-CM | POA: Diagnosis not present

## 2019-04-04 DIAGNOSIS — E559 Vitamin D deficiency, unspecified: Secondary | ICD-10-CM | POA: Diagnosis not present

## 2019-04-04 DIAGNOSIS — J329 Chronic sinusitis, unspecified: Secondary | ICD-10-CM | POA: Diagnosis not present

## 2019-04-04 DIAGNOSIS — Z8781 Personal history of (healed) traumatic fracture: Secondary | ICD-10-CM | POA: Diagnosis present

## 2019-04-04 DIAGNOSIS — Z20828 Contact with and (suspected) exposure to other viral communicable diseases: Secondary | ICD-10-CM | POA: Diagnosis present

## 2019-04-04 DIAGNOSIS — Y92129 Unspecified place in nursing home as the place of occurrence of the external cause: Secondary | ICD-10-CM | POA: Diagnosis not present

## 2019-04-04 DIAGNOSIS — E538 Deficiency of other specified B group vitamins: Secondary | ICD-10-CM | POA: Diagnosis not present

## 2019-04-04 DIAGNOSIS — D72829 Elevated white blood cell count, unspecified: Secondary | ICD-10-CM | POA: Diagnosis present

## 2019-04-04 DIAGNOSIS — I10 Essential (primary) hypertension: Secondary | ICD-10-CM | POA: Diagnosis present

## 2019-04-04 DIAGNOSIS — S72141D Displaced intertrochanteric fracture of right femur, subsequent encounter for closed fracture with routine healing: Secondary | ICD-10-CM | POA: Diagnosis not present

## 2019-04-04 DIAGNOSIS — S72009A Fracture of unspecified part of neck of unspecified femur, initial encounter for closed fracture: Secondary | ICD-10-CM | POA: Diagnosis present

## 2019-04-04 DIAGNOSIS — Z7389 Other problems related to life management difficulty: Secondary | ICD-10-CM | POA: Diagnosis not present

## 2019-04-04 DIAGNOSIS — R0902 Hypoxemia: Secondary | ICD-10-CM | POA: Diagnosis not present

## 2019-04-04 DIAGNOSIS — E642 Sequelae of vitamin C deficiency: Secondary | ICD-10-CM | POA: Diagnosis not present

## 2019-04-04 DIAGNOSIS — S3992XA Unspecified injury of lower back, initial encounter: Secondary | ICD-10-CM | POA: Diagnosis not present

## 2019-04-04 DIAGNOSIS — W19XXXA Unspecified fall, initial encounter: Secondary | ICD-10-CM | POA: Diagnosis not present

## 2019-04-04 DIAGNOSIS — Z79899 Other long term (current) drug therapy: Secondary | ICD-10-CM | POA: Diagnosis not present

## 2019-04-04 DIAGNOSIS — F329 Major depressive disorder, single episode, unspecified: Secondary | ICD-10-CM

## 2019-04-04 DIAGNOSIS — R4182 Altered mental status, unspecified: Secondary | ICD-10-CM | POA: Diagnosis not present

## 2019-04-04 DIAGNOSIS — S79929A Unspecified injury of unspecified thigh, initial encounter: Secondary | ICD-10-CM | POA: Diagnosis not present

## 2019-04-04 DIAGNOSIS — R2681 Unsteadiness on feet: Secondary | ICD-10-CM | POA: Diagnosis not present

## 2019-04-04 DIAGNOSIS — F339 Major depressive disorder, recurrent, unspecified: Secondary | ICD-10-CM

## 2019-04-04 DIAGNOSIS — E039 Hypothyroidism, unspecified: Secondary | ICD-10-CM

## 2019-04-04 DIAGNOSIS — K5900 Constipation, unspecified: Secondary | ICD-10-CM | POA: Diagnosis not present

## 2019-04-04 DIAGNOSIS — D518 Other vitamin B12 deficiency anemias: Secondary | ICD-10-CM | POA: Diagnosis not present

## 2019-04-04 DIAGNOSIS — F0390 Unspecified dementia without behavioral disturbance: Secondary | ICD-10-CM | POA: Diagnosis present

## 2019-04-04 DIAGNOSIS — G301 Alzheimer's disease with late onset: Secondary | ICD-10-CM | POA: Diagnosis not present

## 2019-04-04 DIAGNOSIS — G4700 Insomnia, unspecified: Secondary | ICD-10-CM | POA: Diagnosis not present

## 2019-04-04 DIAGNOSIS — W010XXA Fall on same level from slipping, tripping and stumbling without subsequent striking against object, initial encounter: Secondary | ICD-10-CM | POA: Diagnosis present

## 2019-04-04 DIAGNOSIS — M1990 Unspecified osteoarthritis, unspecified site: Secondary | ICD-10-CM | POA: Diagnosis present

## 2019-04-04 DIAGNOSIS — R410 Disorientation, unspecified: Secondary | ICD-10-CM

## 2019-04-04 DIAGNOSIS — Z9181 History of falling: Secondary | ICD-10-CM | POA: Diagnosis not present

## 2019-04-04 DIAGNOSIS — Z79890 Hormone replacement therapy (postmenopausal): Secondary | ICD-10-CM | POA: Diagnosis not present

## 2019-04-04 DIAGNOSIS — M2550 Pain in unspecified joint: Secondary | ICD-10-CM | POA: Diagnosis not present

## 2019-04-04 DIAGNOSIS — Z96651 Presence of right artificial knee joint: Secondary | ICD-10-CM | POA: Diagnosis present

## 2019-04-04 DIAGNOSIS — M9701XA Periprosthetic fracture around internal prosthetic right hip joint, initial encounter: Secondary | ICD-10-CM | POA: Diagnosis not present

## 2019-04-04 DIAGNOSIS — S72001D Fracture of unspecified part of neck of right femur, subsequent encounter for closed fracture with routine healing: Secondary | ICD-10-CM | POA: Diagnosis not present

## 2019-04-04 DIAGNOSIS — M25561 Pain in right knee: Secondary | ICD-10-CM | POA: Diagnosis not present

## 2019-04-04 DIAGNOSIS — S8991XA Unspecified injury of right lower leg, initial encounter: Secondary | ICD-10-CM | POA: Diagnosis not present

## 2019-04-04 DIAGNOSIS — S199XXA Unspecified injury of neck, initial encounter: Secondary | ICD-10-CM | POA: Diagnosis not present

## 2019-04-04 DIAGNOSIS — S0990XA Unspecified injury of head, initial encounter: Secondary | ICD-10-CM | POA: Diagnosis not present

## 2019-04-04 HISTORY — PX: INTRAMEDULLARY (IM) NAIL INTERTROCHANTERIC: SHX5875

## 2019-04-04 HISTORY — DX: Personal history of (healed) traumatic fracture: Z87.81

## 2019-04-04 HISTORY — DX: Disorientation, unspecified: R41.0

## 2019-04-04 LAB — CBC
HCT: 42.5 % (ref 36.0–46.0)
Hemoglobin: 13.2 g/dL (ref 12.0–15.0)
MCH: 29.1 pg (ref 26.0–34.0)
MCHC: 31.1 g/dL (ref 30.0–36.0)
MCV: 93.8 fL (ref 80.0–100.0)
Platelets: 206 10*3/uL (ref 150–400)
RBC: 4.53 MIL/uL (ref 3.87–5.11)
RDW: 14 % (ref 11.5–15.5)
WBC: 13.3 10*3/uL — ABNORMAL HIGH (ref 4.0–10.5)
nRBC: 0 % (ref 0.0–0.2)

## 2019-04-04 LAB — SURGICAL PCR SCREEN
MRSA, PCR: NEGATIVE
Staphylococcus aureus: NEGATIVE

## 2019-04-04 LAB — BASIC METABOLIC PANEL
Anion gap: 12 (ref 5–15)
BUN: 13 mg/dL (ref 8–23)
CO2: 22 mmol/L (ref 22–32)
Calcium: 10 mg/dL (ref 8.9–10.3)
Chloride: 105 mmol/L (ref 98–111)
Creatinine, Ser: 0.83 mg/dL (ref 0.44–1.00)
GFR calc Af Amer: >60 mL/min (ref >60)
GFR calc non Af Amer: >60 mL/min (ref >60)
Glucose, Bld: 132 mg/dL — ABNORMAL HIGH (ref 70–99)
Potassium: 3.7 mmol/L (ref 3.5–5.1)
Sodium: 139 mmol/L (ref 135–145)

## 2019-04-04 LAB — PROTIME-INR
INR: 0.9 (ref 0.8–1.2)
Prothrombin Time: 12.5 seconds (ref 11.4–15.2)

## 2019-04-04 LAB — SARS CORONAVIRUS 2 BY RT PCR (HOSPITAL ORDER, PERFORMED IN ~~LOC~~ HOSPITAL LAB): SARS Coronavirus 2: NEGATIVE

## 2019-04-04 SURGERY — FIXATION, FRACTURE, INTERTROCHANTERIC, WITH INTRAMEDULLARY ROD
Anesthesia: General | Site: Hip | Laterality: Right

## 2019-04-04 MED ORDER — OXYCODONE HCL 5 MG PO TABS: 5.0000 mg | ORAL_TABLET | Freq: Once | ORAL | Status: DC | PRN

## 2019-04-04 MED ORDER — POVIDONE-IODINE 10 % EX SWAB
2.0000 "application " | Freq: Once | CUTANEOUS | Status: AC
  Administered 2019-04-04: 2 via TOPICAL

## 2019-04-04 MED ORDER — VITAMIN B-12 1000 MCG PO TABS
1000.0000 ug | ORAL_TABLET | Freq: Every day | ORAL | Status: DC
  Administered 2019-04-05 – 2019-04-07 (×3): 1000 ug via ORAL
  Filled 2019-04-04 (×4): qty 1

## 2019-04-04 MED ORDER — ENOXAPARIN SODIUM 40 MG/0.4ML ~~LOC~~ SOLN
40.0000 mg | SUBCUTANEOUS | Status: DC
  Administered 2019-04-05 – 2019-04-07 (×3): 40 mg via SUBCUTANEOUS
  Filled 2019-04-04 (×3): qty 0.4

## 2019-04-04 MED ORDER — SUCCINYLCHOLINE CHLORIDE 200 MG/10ML IV SOSY
PREFILLED_SYRINGE | INTRAVENOUS | Status: DC | PRN
  Administered 2019-04-04: 100 mg via INTRAVENOUS

## 2019-04-04 MED ORDER — MELATONIN 5 MG PO TABS
5.0000 mg | ORAL_TABLET | Freq: Every evening | ORAL | Status: DC | PRN
  Filled 2019-04-04: qty 1

## 2019-04-04 MED ORDER — ROCURONIUM BROMIDE 10 MG/ML (PF) SYRINGE
PREFILLED_SYRINGE | INTRAVENOUS | Status: DC | PRN
  Administered 2019-04-04: 50 mg via INTRAVENOUS
  Administered 2019-04-04 (×2): 20 mg via INTRAVENOUS

## 2019-04-04 MED ORDER — FENTANYL CITRATE (PF) 100 MCG/2ML IJ SOLN
INTRAMUSCULAR | Status: AC
  Filled 2019-04-04: qty 2

## 2019-04-04 MED ORDER — METOCLOPRAMIDE HCL 5 MG PO TABS: 5.0000 mg | ORAL_TABLET | Freq: Three times a day (TID) | ORAL | Status: DC | PRN

## 2019-04-04 MED ORDER — EPHEDRINE 5 MG/ML INJ
INTRAVENOUS | Status: AC
  Filled 2019-04-04: qty 10

## 2019-04-04 MED ORDER — OXYCODONE HCL 5 MG/5ML PO SOLN: 5.0000 mg | Freq: Once | ORAL | Status: DC | PRN

## 2019-04-04 MED ORDER — METOCLOPRAMIDE HCL 5 MG/ML IJ SOLN: 5.0000 mg | Freq: Three times a day (TID) | INTRAMUSCULAR | Status: DC | PRN

## 2019-04-04 MED ORDER — HYDROMORPHONE HCL 1 MG/ML IJ SOLN
INTRAMUSCULAR | Status: AC
  Administered 2019-04-04: 17:00:00 0.25 mg via INTRAVENOUS
  Filled 2019-04-04: qty 1

## 2019-04-04 MED ORDER — DULOXETINE HCL 30 MG PO CPEP
30.0000 mg | ORAL_CAPSULE | Freq: Every day | ORAL | Status: DC
  Administered 2019-04-05 – 2019-04-07 (×3): 30 mg via ORAL
  Filled 2019-04-04 (×3): qty 1

## 2019-04-04 MED ORDER — SUCCINYLCHOLINE CHLORIDE 200 MG/10ML IV SOSY
PREFILLED_SYRINGE | INTRAVENOUS | Status: AC
  Filled 2019-04-04: qty 10

## 2019-04-04 MED ORDER — VITAMIN C 500 MG PO TABS
500.0000 mg | ORAL_TABLET | Freq: Every day | ORAL | Status: DC
  Administered 2019-04-05 – 2019-04-07 (×3): 500 mg via ORAL
  Filled 2019-04-04 (×4): qty 1

## 2019-04-04 MED ORDER — TRANEXAMIC ACID-NACL 1000-0.7 MG/100ML-% IV SOLN
1000.0000 mg | INTRAVENOUS | Status: AC
  Administered 2019-04-04: 1000 mg via INTRAVENOUS
  Filled 2019-04-04: qty 100

## 2019-04-04 MED ORDER — SODIUM CHLORIDE 0.9 % IR SOLN
Status: DC | PRN
  Administered 2019-04-04: 1000 mL

## 2019-04-04 MED ORDER — HYDROMORPHONE HCL 1 MG/ML IJ SOLN
0.2500 mg | INTRAMUSCULAR | Status: DC | PRN
  Administered 2019-04-04 (×3): 0.25 mg via INTRAVENOUS

## 2019-04-04 MED ORDER — ONDANSETRON HCL 4 MG/2ML IJ SOLN
INTRAMUSCULAR | Status: DC | PRN
  Administered 2019-04-04: 4 mg via INTRAVENOUS

## 2019-04-04 MED ORDER — CEFAZOLIN SODIUM-DEXTROSE 2-4 GM/100ML-% IV SOLN
2.0000 g | INTRAVENOUS | Status: AC
  Administered 2019-04-04: 2 g via INTRAVENOUS
  Filled 2019-04-04: qty 100

## 2019-04-04 MED ORDER — LIDOCAINE 2% (20 MG/ML) 5 ML SYRINGE
INTRAMUSCULAR | Status: AC
  Filled 2019-04-04: qty 5

## 2019-04-04 MED ORDER — LIDOCAINE 2% (20 MG/ML) 5 ML SYRINGE
INTRAMUSCULAR | Status: DC | PRN
  Administered 2019-04-04: 40 mg via INTRAVENOUS

## 2019-04-04 MED ORDER — SUGAMMADEX SODIUM 500 MG/5ML IV SOLN
INTRAVENOUS | Status: AC
  Filled 2019-04-04: qty 5

## 2019-04-04 MED ORDER — PROPOFOL 10 MG/ML IV BOLUS
INTRAVENOUS | Status: AC
  Filled 2019-04-04: qty 20

## 2019-04-04 MED ORDER — SENNA 8.6 MG PO TABS
1.0000 | ORAL_TABLET | Freq: Two times a day (BID) | ORAL | Status: DC
  Administered 2019-04-05 – 2019-04-07 (×5): 8.6 mg via ORAL
  Filled 2019-04-04 (×5): qty 1

## 2019-04-04 MED ORDER — MENTHOL 3 MG MT LOZG: 1.0000 | LOZENGE | OROMUCOSAL | Status: DC | PRN

## 2019-04-04 MED ORDER — DOCUSATE SODIUM 100 MG PO CAPS
100.0000 mg | ORAL_CAPSULE | Freq: Two times a day (BID) | ORAL | Status: DC
  Administered 2019-04-05 – 2019-04-07 (×5): 100 mg via ORAL
  Filled 2019-04-04 (×5): qty 1

## 2019-04-04 MED ORDER — FENTANYL CITRATE (PF) 100 MCG/2ML IJ SOLN
INTRAMUSCULAR | Status: DC | PRN
  Administered 2019-04-04: 25 ug via INTRAVENOUS
  Administered 2019-04-04: 50 ug via INTRAVENOUS
  Administered 2019-04-04: 25 ug via INTRAVENOUS
  Administered 2019-04-04 (×2): 50 ug via INTRAVENOUS

## 2019-04-04 MED ORDER — HYDRALAZINE HCL 20 MG/ML IJ SOLN
10.0000 mg | Freq: Once | INTRAMUSCULAR | Status: AC
  Administered 2019-04-04: 17:00:00 10 mg via INTRAVENOUS

## 2019-04-04 MED ORDER — CHLORHEXIDINE GLUCONATE 4 % EX LIQD: 60.0000 mL | Freq: Once | CUTANEOUS | Status: DC

## 2019-04-04 MED ORDER — DEXAMETHASONE SODIUM PHOSPHATE 10 MG/ML IJ SOLN
INTRAMUSCULAR | Status: AC
  Filled 2019-04-04: qty 1

## 2019-04-04 MED ORDER — VITAMIN D 25 MCG (1000 UNIT) PO TABS
1000.0000 [IU] | ORAL_TABLET | Freq: Every day | ORAL | Status: DC
  Filled 2019-04-04: qty 1

## 2019-04-04 MED ORDER — MORPHINE SULFATE (PF) 2 MG/ML IV SOLN
0.5000 mg | INTRAVENOUS | Status: DC | PRN
  Administered 2019-04-04: 08:00:00 0.5 mg via INTRAVENOUS
  Filled 2019-04-04: qty 1

## 2019-04-04 MED ORDER — HYDRALAZINE HCL 20 MG/ML IJ SOLN: 5.0000 mg | INTRAMUSCULAR | Status: DC | PRN

## 2019-04-04 MED ORDER — ONDANSETRON HCL 4 MG PO TABS
4.0000 mg | ORAL_TABLET | Freq: Four times a day (QID) | ORAL | Status: DC | PRN
  Administered 2019-04-05: 23:00:00 4 mg via ORAL
  Filled 2019-04-04: qty 1

## 2019-04-04 MED ORDER — HYDRALAZINE HCL 20 MG/ML IJ SOLN
INTRAMUSCULAR | Status: AC
  Administered 2019-04-04: 17:00:00 10 mg via INTRAVENOUS
  Filled 2019-04-04: qty 1

## 2019-04-04 MED ORDER — ROCURONIUM BROMIDE 10 MG/ML (PF) SYRINGE
PREFILLED_SYRINGE | INTRAVENOUS | Status: AC
  Filled 2019-04-04: qty 10

## 2019-04-04 MED ORDER — CEFAZOLIN SODIUM-DEXTROSE 2-4 GM/100ML-% IV SOLN
2.0000 g | Freq: Four times a day (QID) | INTRAVENOUS | Status: AC
  Administered 2019-04-04 – 2019-04-05 (×2): 2 g via INTRAVENOUS
  Filled 2019-04-04 (×2): qty 100

## 2019-04-04 MED ORDER — LACTATED RINGERS IV SOLN
INTRAVENOUS | Status: DC
  Administered 2019-04-04: 13:00:00 via INTRAVENOUS

## 2019-04-04 MED ORDER — DEXAMETHASONE SODIUM PHOSPHATE 10 MG/ML IJ SOLN
INTRAMUSCULAR | Status: DC | PRN
  Administered 2019-04-04: 4 mg via INTRAVENOUS

## 2019-04-04 MED ORDER — ONDANSETRON HCL 4 MG/2ML IJ SOLN
INTRAMUSCULAR | Status: AC
  Filled 2019-04-04: qty 2

## 2019-04-04 MED ORDER — SUGAMMADEX SODIUM 200 MG/2ML IV SOLN
INTRAVENOUS | Status: DC | PRN
  Administered 2019-04-04: 400 mg via INTRAVENOUS

## 2019-04-04 MED ORDER — EPHEDRINE SULFATE-NACL 50-0.9 MG/10ML-% IV SOSY
PREFILLED_SYRINGE | INTRAVENOUS | Status: DC | PRN
  Administered 2019-04-04: 10 mg via INTRAVENOUS
  Administered 2019-04-04: 5 mg via INTRAVENOUS

## 2019-04-04 MED ORDER — PHENOL 1.4 % MT LIQD: 1.0000 | OROMUCOSAL | Status: DC | PRN

## 2019-04-04 MED ORDER — STERILE WATER FOR IRRIGATION IR SOLN
Status: DC | PRN
  Administered 2019-04-04: 1000 mL

## 2019-04-04 MED ORDER — PROMETHAZINE HCL 25 MG/ML IJ SOLN: 6.2500 mg | INTRAMUSCULAR | Status: DC | PRN

## 2019-04-04 MED ORDER — ONDANSETRON HCL 4 MG/2ML IJ SOLN: 4.0000 mg | Freq: Four times a day (QID) | INTRAMUSCULAR | Status: DC | PRN

## 2019-04-04 MED ORDER — ENSURE PRE-SURGERY PO LIQD
296.0000 mL | Freq: Once | ORAL | Status: DC
  Filled 2019-04-04: qty 296

## 2019-04-04 MED ORDER — PROPOFOL 10 MG/ML IV BOLUS
INTRAVENOUS | Status: DC | PRN
  Administered 2019-04-04: 80 mg via INTRAVENOUS

## 2019-04-04 MED ORDER — TRAZODONE HCL 50 MG PO TABS
25.0000 mg | ORAL_TABLET | Freq: Every day | ORAL | Status: DC
  Administered 2019-04-05 – 2019-04-06 (×2): 25 mg via ORAL
  Filled 2019-04-04 (×2): qty 1

## 2019-04-04 MED ORDER — SODIUM CHLORIDE 0.9 % IV SOLN
INTRAVENOUS | Status: AC
  Administered 2019-04-04: 10:00:00 via INTRAVENOUS

## 2019-04-04 MED ORDER — DIVALPROEX SODIUM 125 MG PO CSDR
125.0000 mg | DELAYED_RELEASE_CAPSULE | Freq: Two times a day (BID) | ORAL | Status: DC
  Administered 2019-04-05 – 2019-04-07 (×5): 125 mg via ORAL
  Filled 2019-04-04 (×8): qty 1

## 2019-04-04 MED ORDER — LEVOTHYROXINE SODIUM 25 MCG PO TABS
25.0000 ug | ORAL_TABLET | Freq: Every day | ORAL | Status: DC
  Administered 2019-04-05 – 2019-04-07 (×3): 25 ug via ORAL
  Filled 2019-04-04 (×4): qty 1

## 2019-04-04 MED ORDER — HYDROCODONE-ACETAMINOPHEN 5-325 MG PO TABS
1.0000 | ORAL_TABLET | Freq: Four times a day (QID) | ORAL | Status: DC | PRN
  Administered 2019-04-05: 1 via ORAL
  Administered 2019-04-05: 18:00:00 2 via ORAL
  Administered 2019-04-05: 1 via ORAL
  Administered 2019-04-05: 13:00:00 2 via ORAL
  Filled 2019-04-04: qty 2
  Filled 2019-04-04: qty 1
  Filled 2019-04-04: qty 2
  Filled 2019-04-04: qty 1

## 2019-04-04 SURGICAL SUPPLY — 40 items
BAG ZIPLOCK 12X15 (MISCELLANEOUS) IMPLANT
BIT DRILL AO GAMMA 4.2X340 (BIT) ×1 IMPLANT
CHLORAPREP W/TINT 26 (MISCELLANEOUS) ×2 IMPLANT
COVER PERINEAL POST (MISCELLANEOUS) ×2 IMPLANT
COVER SURGICAL LIGHT HANDLE (MISCELLANEOUS) ×2 IMPLANT
COVER WAND RF STERILE (DRAPES) IMPLANT
DERMABOND ADVANCED (GAUZE/BANDAGES/DRESSINGS) ×1
DERMABOND ADVANCED .7 DNX12 (GAUZE/BANDAGES/DRESSINGS) ×2 IMPLANT
DRAPE C-ARM 42X120 X-RAY (DRAPES) ×2 IMPLANT
DRAPE C-ARMOR (DRAPES) ×2 IMPLANT
DRAPE IMP U-DRAPE 54X76 (DRAPES) ×4 IMPLANT
DRAPE SHEET LG 3/4 BI-LAMINATE (DRAPES) ×4 IMPLANT
DRAPE STERI IOBAN 125X83 (DRAPES) ×2 IMPLANT
DRAPE U-SHAPE 47X51 STRL (DRAPES) ×4 IMPLANT
DRSG MEPILEX BORDER 4X4 (GAUZE/BANDAGES/DRESSINGS) ×3 IMPLANT
DRSG MEPILEX BORDER 4X8 (GAUZE/BANDAGES/DRESSINGS) ×1 IMPLANT
ELECT BLADE TIP CTD 4 INCH (ELECTRODE) IMPLANT
FACESHIELD WRAPAROUND (MASK) ×4 IMPLANT
FACESHIELD WRAPAROUND OR TEAM (MASK) ×2 IMPLANT
GAUZE SPONGE 4X4 12PLY STRL (GAUZE/BANDAGES/DRESSINGS) ×2 IMPLANT
GLOVE BIO SURGEON STRL SZ8.5 (GLOVE) ×4 IMPLANT
GLOVE BIOGEL PI IND STRL 8.5 (GLOVE) ×1 IMPLANT
GLOVE BIOGEL PI INDICATOR 8.5 (GLOVE) ×1
GOWN SPEC L3 XXLG W/TWL (GOWN DISPOSABLE) ×2 IMPLANT
K-WIRE  3.2X450M STR (WIRE) ×1
K-WIRE 3.2X450M STR (WIRE) ×1
KIT BASIN OR (CUSTOM PROCEDURE TRAY) ×2 IMPLANT
KIT TURNOVER KIT A (KITS) IMPLANT
KWIRE 3.2X450M STR (WIRE) IMPLANT
MANIFOLD NEPTUNE II (INSTRUMENTS) ×2 IMPLANT
MARKER SKIN DUAL TIP RULER LAB (MISCELLANEOUS) ×2 IMPLANT
NAIL TROCH GAMMA 11X18 (Nail) ×1 IMPLANT
PACK GENERAL/GYN (CUSTOM PROCEDURE TRAY) ×2 IMPLANT
SCREW LAG GAMMA 3 95MM (Screw) ×1 IMPLANT
SCREW LOCKING T2 F/T  5X32.5MM (Screw) ×1 IMPLANT
SCREW LOCKING T2 F/T 5X32.5MM (Screw) IMPLANT
SUT MNCRL AB 3-0 PS2 18 (SUTURE) ×2 IMPLANT
SUT MON AB 2-0 CT1 36 (SUTURE) ×1 IMPLANT
SUT VIC AB 1 CT1 36 (SUTURE) ×2 IMPLANT
TOWEL OR 17X26 10 PK STRL BLUE (TOWEL DISPOSABLE) ×2 IMPLANT

## 2019-04-04 NOTE — ED Notes (Signed)
Placed on 2L of nasal cannula. Stats not going above 87% on Room Air

## 2019-04-04 NOTE — Anesthesia Procedure Notes (Signed)
Procedure Name: Intubation Date/Time: 04/04/2019 2:28 PM Performed by: Niel Hummer, CRNA Pre-anesthesia Checklist: Patient identified, Emergency Drugs available, Suction available and Patient being monitored Patient Re-evaluated:Patient Re-evaluated prior to induction Oxygen Delivery Method: Circle system utilized Preoxygenation: Pre-oxygenation with 100% oxygen Induction Type: IV induction Laryngoscope Size: Mac and 4 Grade View: Grade I Tube type: Oral Tube size: 7.0 mm Number of attempts: 1 Airway Equipment and Method: Stylet Placement Confirmation: ETT inserted through vocal cords under direct vision,  breath sounds checked- equal and bilateral and positive ETCO2 Secured at: 22 cm Tube secured with: Tape Dental Injury: Teeth and Oropharynx as per pre-operative assessment

## 2019-04-04 NOTE — Anesthesia Preprocedure Evaluation (Signed)
Anesthesia Evaluation  Patient identified by MRN, date of birth, ID band Patient awake    Reviewed: Allergy & Precautions, NPO status , Patient's Chart, lab work & pertinent test results  Airway Mallampati: II  TM Distance: >3 FB Neck ROM: Full    Dental no notable dental hx.    Pulmonary neg pulmonary ROS,    Pulmonary exam normal breath sounds clear to auscultation       Cardiovascular hypertension, Normal cardiovascular exam Rhythm:Regular Rate:Normal     Neuro/Psych Depression negative neurological ROS  negative psych ROS   GI/Hepatic negative GI ROS, Neg liver ROS,   Endo/Other  Hypothyroidism   Renal/GU negative Renal ROS  negative genitourinary   Musculoskeletal  (+) Arthritis , Osteoarthritis,    Abdominal   Peds negative pediatric ROS (+)  Hematology negative hematology ROS (+)   Anesthesia Other Findings Confusion Hip fracture  Reproductive/Obstetrics negative OB ROS                             Anesthesia Physical Anesthesia Plan  ASA: III  Anesthesia Plan: General   Post-op Pain Management:    Induction: Intravenous  PONV Risk Score and Plan: 3 and Ondansetron, Dexamethasone, Midazolam and Treatment may vary due to age or medical condition  Airway Management Planned: Oral ETT  Additional Equipment:   Intra-op Plan:   Post-operative Plan: Extubation in OR  Informed Consent: I have reviewed the patients History and Physical, chart, labs and discussed the procedure including the risks, benefits and alternatives for the proposed anesthesia with the patient or authorized representative who has indicated his/her understanding and acceptance.     Dental advisory given  Plan Discussed with: CRNA  Anesthesia Plan Comments:         Anesthesia Quick Evaluation

## 2019-04-04 NOTE — Transfer of Care (Signed)
Immediate Anesthesia Transfer of Care Note  Patient: Haley Cohen  Procedure(s) Performed: INTRAMEDULLARY (IM) NAIL INTERTROCHANTRIC (Right Hip)  Patient Location: PACU  Anesthesia Type:General  Level of Consciousness: awake  Airway & Oxygen Therapy: Patient Spontanous Breathing and Patient connected to face mask oxygen  Post-op Assessment: Report given to RN and Post -op Vital signs reviewed and stable  Post vital signs: Reviewed and stable  Last Vitals:  Vitals Value Taken Time  BP 141/120 04/04/19 1624  Temp    Pulse 83 04/04/19 1625  Resp 15 04/04/19 1625  SpO2 97 % 04/04/19 1625  Vitals shown include unvalidated device data.  Last Pain:  Vitals:   04/04/19 1306  TempSrc: Oral  PainSc:          Complications: No apparent anesthesia complications

## 2019-04-04 NOTE — TOC Initial Note (Signed)
Transition of Care [facility]) - Initial/Assessment Note    Patient Details  Name: Haley Cohen MRN: [id number] Date of Birth: [birth year]  Transition of Care [facility]) CM/SW Contact:    Leeroy Cha, RN Phone Number: 04/04/2019, 11:18 AM  Clinical Narrative:   [facility] at [facility];  Patient will go to bed 924 at the [facility] entrance.     \ Call report to [phone] or [phone] Please fax dc summary to [place] at [phone]. Raquel Sarna sw [phone]           fl2 faxed to [facility].        Patient Goals and CMS Choice        Expected Discharge Plan and Services                                                Prior Living Arrangements/Services                       Activities of Daily Living Home Assistive Devices/Equipment: Grab bars around toilet, Grab bars in shower, Hand-held shower hose, Walker (specify type), Eyeglasses(has a 4 wheeled walker with seat) ADL Screening (condition at time of admission) Patient's cognitive ability adequate to safely complete daily activities?: Yes Is the patient deaf or have difficulty hearing?: No Does the patient have difficulty seeing, even when wearing glasses/contacts?: No Does the patient have difficulty concentrating, remembering, or making decisions?: No Patient able to express need for assistance with ADLs?: Yes Does the patient have difficulty dressing or bathing?: Yes Independently performs ADLs?: No Communication: Independent Dressing (OT): Needs assistance Is this a change from baseline?: Change from baseline, expected to last >3 days Grooming: Needs assistance Is this a change from baseline?: Change from baseline, expected to last >3 days Feeding: Needs assistance Is this a change from baseline?: Change from baseline, expected to last >3 days Bathing: Needs assistance Is this a change from baseline?: Change from baseline, expected to last >3 days Toileting: Dependent Is this a  change from baseline?: Change from baseline, expected to last >3days In/Out Bed: Dependent Is this a change from baseline?: Change from baseline, expected to last >3 days Walks in Home: Dependent Is this a change from baseline?: Change from baseline, expected to last >3 days Does the patient have difficulty walking or climbing stairs?: Yes Weakness of Legs: Right Weakness of Arms/Hands: None  Permission Sought/Granted                  Emotional Assessment              Admission diagnosis:  Fall [W19.XXXA] Displaced intertrochanteric fracture of right femur ([place]) [S72.141A] Closed right hip fracture, [facility] encounter [facility] [address]) [S72.001A] Patient Active Problem List   Diagnosis Date Noted  . Hip fracture ([employer]) 04/04/2019  . Leukocytosis 04/04/2019  . HTN (hypertension) 04/04/2019  . Depression 04/04/2019  . Hypothyroidism 04/04/2019   PCP:  [name], MD Pharmacy:  No Pharmacies Listed    Social Determinants of Health (SDOH) Interventions    Readmission Risk Interventions No flowsheet data found.

## 2019-04-04 NOTE — ED Notes (Signed)
ED TO INPATIENT HANDOFF REPORT  Name/Age/Gender Haley Cohen 85 y.o. female  Code Status    Code Status Orders  (From admission, onward)         Start     Ordered   04/04/19 0547  Full code  Continuous     04/04/19 0547        Code Status History    This patient has a current code status but no historical code status.   Advance Care Planning Activity    Advance Directive Documentation     Most Recent Value  Type of Advance Directive  Out of facility DNR (pink MOST or yellow form)  Pre-existing out of facility DNR order (yellow form or pink MOST form)  Yellow form placed in chart (order not valid for inpatient use)  "MOST" Form in Place?  -      Home/SNF/Other Home  Chief Complaint Fall  Level of Care/Admitting Diagnosis ED Disposition    ED Disposition Condition [place]: [place] [100102]  Level of Care: Med-Surg [16]  Covid Evaluation: Asymptomatic Screening Protocol (No Symptoms)  [facility]: [id number]  Admitting Physician: Shela Leff [id number]  Attending Physician: Shela Leff [phone]  Estimated length of stay: past midnight tomorrow  Certification:: I certify this patient will need inpatient services for at least 2 midnights  PT Class (Do Not Modify): Inpatient [101]  PT Acc Code (Do Not Modify): Private [1]       Medical History Past Medical History:  Diagnosis Date  . Arthritis     Allergies No Known Allergies  IV Location/Drains/Wounds Patient Lines/Drains/Airways Status   Active Line/Drains/Airways    Name:   Placement date:   Placement time:   Site:   Days:   Peripheral IV 04/03/19 Left;Posterior Hand   04/03/19    2330    Hand   1          Labs/Imaging Results for orders placed or performed during the hospital encounter of 04/03/19 (from the past 48 hour(s))  Basic metabolic panel     Status: Abnormal   Collection Time: 04/04/19 12:51 AM  Result  Value Ref Range   Sodium 139 135 - 145 mmol/L   Potassium 3.7 3.5 - 5.1 mmol/L   Chloride 105 98 - 111 mmol/L   CO2 22 22 - 32 mmol/L   Glucose, Bld 132 (H) 70 - 99 mg/dL   BUN 13 8 - 23 mg/dL   Creatinine, Ser 0.83 0.44 - 1.00 mg/dL   Calcium 10.0 8.9 - 10.3 mg/dL   GFR calc non Af Amer >60 >60 mL/min   GFR calc Af Amer >60 >60 mL/min   Anion gap 12 5 - 15    Comment: Performed at [facility], [place] [address] [place], [place] [address]  Protime-INR     Status: None   Collection Time: 04/04/19 12:51 AM  Result Value Ref Range   Prothrombin Time 12.5 11.4 - 15.2 seconds   INR 0.9 0.8 - 1.2    Comment: (NOTE) INR goal varies based on device and disease states. Performed at [facility], [place] [address], [place], [place] [address]   CBC     Status: Abnormal   Collection Time: 04/04/19  3:40 AM  Result Value Ref Range   WBC 13.3 (H) 4.0 - 10.5 K/uL   RBC 4.53 3.87 - 5.11 MIL/uL   Hemoglobin 13.2 12.0 - 15.0 g/dL  HCT 42.5 36.0 - 46.0 %   MCV 93.8 80.0 - 100.0 fL   MCH 29.1 26.0 - 34.0 pg   MCHC 31.1 30.0 - 36.0 g/dL   RDW 14.0 11.5 - 15.5 %   Platelets 206 150 - 400 K/uL   nRBC 0.0 0.0 - 0.2 %    Comment: Performed at [facility], [place] [address] [place], [place] [address]  SARS Coronavirus 2 [facility] order, Performed in [facility] hospital lab) Nasopharyngeal Nasopharyngeal Swab     Status: None   Collection Time: 04/04/19  4:58 AM   Specimen: Nasopharyngeal Swab  Result Value Ref Range   SARS Coronavirus 2 NEGATIVE NEGATIVE    Comment: (NOTE) If result is NEGATIVE SARS-CoV-2 target nucleic acids are NOT DETECTED. The SARS-CoV-2 RNA is generally detectable in upper and lower  respiratory specimens during the acute phase of infection. The lowest  concentration of SARS-CoV-2 viral copies this assay can detect is 250  copies / mL. A negative result does not preclude SARS-CoV-2 infection  and should not be used as  the sole basis for treatment or other  patient management decisions.  A negative result may occur with  improper specimen collection / handling, submission of specimen other  than nasopharyngeal swab, presence of viral mutation(s) within the  areas targeted by this assay, and inadequate number of viral copies  (<250 copies / mL). A negative result must be combined with clinical  observations, patient history, and epidemiological information. If result is POSITIVE SARS-CoV-2 target nucleic acids are DETECTED. The SARS-CoV-2 RNA is generally detectable in upper and lower  respiratory specimens dur ing the acute phase of infection.  Positive  results are indicative of active infection with SARS-CoV-2.  Clinical  correlation with patient history and other diagnostic information is  necessary to determine patient infection status.  Positive results do  not rule out bacterial infection or co-infection with other viruses. If result is PRESUMPTIVE POSTIVE SARS-CoV-2 nucleic acids MAY BE PRESENT.   A presumptive positive result was obtained on the submitted specimen  and confirmed on repeat testing.  While 2019 novel coronavirus  (SARS-CoV-2) nucleic acids may be present in the submitted sample  additional confirmatory testing may be necessary for epidemiological  and / or clinical management purposes  to differentiate between  SARS-CoV-2 and other Sarbecovirus currently known to infect humans.  If clinically indicated additional testing with an alternate test  methodology [phone]) is advised. The SARS-CoV-2 RNA is generally  detectable in upper and lower respiratory sp ecimens during the acute  phase of infection. The expected result is Negative. Fact Sheet for Patients:  StrictlyIdeas.no Fact Sheet for Healthcare Providers: BankingDealers.co.za This test is not yet approved or cleared by the [place] FDA and has been authorized for  detection and/or diagnosis of SARS-CoV-2 by FDA under an Emergency Use Authorization (EUA).  This EUA will remain in effect (meaning this test can be used) for the duration of the COVID-19 declaration under Section 564(b)(1) of the Act, 21 U.S.C. section 360bbb-3(b)(1), unless the authorization is terminated or revoked sooner. Performed at [facility], [place] [address], [place], [place] [address]    Dg Chest 1 View  Result Date: 04/04/2019 CLINICAL DATA:  Fall EXAM: CHEST  1 VIEW COMPARISON:  None. FINDINGS: Low lung volumes. Linear atelectasis or scarring at the left mid lung and left lung base. Right lung is clear. Normal heart size. No pneumothorax. IMPRESSION: Low lung volumes with atelectasis and or scarring  in the left mid lung and left lung base Electronically Signed   By: Donavan Foil M.D.   On: 04/04/2019 03:38   Dg Thoracic Spine 2 View  Result Date: 04/04/2019 CLINICAL DATA:  Fall EXAM: THORACIC SPINE 2 VIEWS COMPARISON:  None. FINDINGS: Limited by positioning. Thoracic alignment grossly within normal limits. Vertebral body heights appear maintained. Diffuse osteophytosis of the thoracic spine. IMPRESSION: No definite acute osseous abnormality. Electronically Signed   By: Donavan Foil M.D.   On: 04/04/2019 03:45   Dg Lumbar Spine Complete  Result Date: 04/04/2019 CLINICAL DATA:  Fall EXAM: LUMBAR SPINE - COMPLETE 4+ VIEW COMPARISON:  None. FINDINGS: Transitional anatomy suspected with 4 non rib-bearing vertebra. First non rib-bearing lumbar vertebra will be designated L1. Left lateral positioning of L2 with respect to L3 on the frontal view. This is of uncertain chronicity. Suspected lucency about the right posterior rod and fixating screws at L3-L4. Vertebral body heights are grossly maintained. Prominent degenerative changes at L3-L4. Diffuse osteophytes of the lumbar spine. IMPRESSION: 1. Suspected transitional anatomy. First non rib-bearing lumbar vertebra is  designated L1 for the purposes of reporting 2. Somewhat abrupt appearing left lateral displacement of L2 with respect to L3 on AP view of uncertain chronicity. There is posterior rod and fixating screws at L3-L4 with suspected lucency/loosening about the right pedicle screws, and possible partial dislodgement of the rods and fixating screws on the right side. No gross acute fracture is seen. Electronically Signed   By: Donavan Foil M.D.   On: 04/04/2019 03:43   Ct Head Wo Contrast  Result Date: 04/04/2019 CLINICAL DATA:  85 year old female with fall EXAM: CT HEAD WITHOUT CONTRAST CT CERVICAL SPINE WITHOUT CONTRAST TECHNIQUE: Multidetector CT imaging of the head and cervical spine was performed following the standard protocol without intravenous contrast. Multiplanar CT image reconstructions of the cervical spine were also generated. COMPARISON:  None. FINDINGS: CT HEAD FINDINGS Brain: There is mild age-related atrophy and chronic microvascular ischemic changes. There is no acute intracranial. No mass effect or midline shift. No extra-axial fluid collection. Vascular: No hyperdense vessel or unexpected calcification. Skull: Normal. Negative for fracture or focal lesion. Sinuses/Orbits: No acute finding. Other: None CT CERVICAL SPINE FINDINGS Evaluation of this exam is limited due to motion artifact. Alignment: No acute subluxation. There is grade 1 C4-C5 anterolisthesis. Skull base and vertebrae: No definite acute fracture. C4 and C5 laminectomy changes. Soft tissues and spinal canal: No prevertebral fluid or swelling. No visible canal hematoma. Disc levels: Multilevel degenerative changes and disc disease with endplate irregularity and disc space narrowing. Multilevel facet arthropathy. Upper chest: Negative. Other: Bilateral carotid bulb calcified plaques. IMPRESSION: 1. No acute intracranial hemorrhage. 2. Mild age-related atrophy and chronic microvascular ischemic changes. 3. No acute/traumatic cervical  spine pathology. Electronically Signed   By: Anner Crete M.D.   On: 04/04/2019 03:43   Ct Cervical Spine Wo Contrast  Result Date: 04/04/2019 CLINICAL DATA:  85 year old female with fall EXAM: CT HEAD WITHOUT CONTRAST CT CERVICAL SPINE WITHOUT CONTRAST TECHNIQUE: Multidetector CT imaging of the head and cervical spine was performed following the standard protocol without intravenous contrast. Multiplanar CT image reconstructions of the cervical spine were also generated. COMPARISON:  None. FINDINGS: CT HEAD FINDINGS Brain: There is mild age-related atrophy and chronic microvascular ischemic changes. There is no acute intracranial. No mass effect or midline shift. No extra-axial fluid collection. Vascular: No hyperdense vessel or unexpected calcification. Skull: Normal. Negative for fracture or focal lesion. Sinuses/Orbits: No acute finding.  Other: None CT CERVICAL SPINE FINDINGS Evaluation of this exam is limited due to motion artifact. Alignment: No acute subluxation. There is grade 1 C4-C5 anterolisthesis. Skull base and vertebrae: No definite acute fracture. C4 and C5 laminectomy changes. Soft tissues and spinal canal: No prevertebral fluid or swelling. No visible canal hematoma. Disc levels: Multilevel degenerative changes and disc disease with endplate irregularity and disc space narrowing. Multilevel facet arthropathy. Upper chest: Negative. Other: Bilateral carotid bulb calcified plaques. IMPRESSION: 1. No acute intracranial hemorrhage. 2. Mild age-related atrophy and chronic microvascular ischemic changes. 3. No acute/traumatic cervical spine pathology. Electronically Signed   By: Anner Crete M.D.   On: 04/04/2019 03:43   Dg Hip Unilat With Pelvis 2-3 Views Right  Result Date: 04/04/2019 CLINICAL DATA:  Fall with hip pain EXAM: DG HIP (WITH OR WITHOUT PELVIS) 2-3V RIGHT COMPARISON:  None. FINDINGS: Right SI joint is non widened. Pubic symphysis is intact. Acute comminuted right  intertrochanteric fracture. Right femoral head projects in joint. Skin fold artifact versus nondisplaced fracture over the right inferior pubic ramus. IMPRESSION: 1. Acute comminuted right intertrochanteric fracture. 2. Skin fold artifact versus nondisplaced fracture over the right inferior pubic ramus Electronically Signed   By: Donavan Foil M.D.   On: 04/04/2019 03:47    Pending Labs Unresulted Labs (From admission, onward)    Start     Ordered   04/05/19 0500  CBC  Tomorrow morning,   R     04/04/19 0547   04/04/19 0548  Urinalysis, Routine w reflex microscopic  Once,   STAT     04/04/19 0547          Vitals/Pain Today's Vitals   04/04/19 0530 04/04/19 0600 04/04/19 0630 04/04/19 0730  BP: (!) 170/148 (!) 168/76 (!) 145/58 (!) 168/77  Pulse: 79 61 (!) 59 71  Resp: 19  18   Temp:      TempSrc:      SpO2: 92% 95% 97% 96%    Isolation Precautions No active isolations  Medications Medications  DULoxetine (CYMBALTA) DR capsule 30 mg (has no administration in time range)  traZODone (DESYREL) tablet 25 mg (has no administration in time range)  levothyroxine (SYNTHROID) tablet 25 mcg (has no administration in time range)  vitamin B-12 (CYANOCOBALAMIN) tablet 1,000 mcg (has no administration in time range)  Melatonin TABS 5 mg (has no administration in time range)  divalproex (DEPAKOTE SPRINKLE) capsule 125 mg (has no administration in time range)  cholecalciferol (VITAMIN D3) tablet 1,000 Units (has no administration in time range)  vitamin C (ASCORBIC ACID) tablet 500 mg (has no administration in time range)  HYDROcodone-acetaminophen (NORCO/VICODIN) 5-325 MG per tablet 1-2 tablet (has no administration in time range)  morphine 2 MG/ML injection 0.5 mg (has no administration in time range)  0.9 %  sodium chloride infusion (has no administration in time range)  hydrALAZINE (APRESOLINE) injection 5 mg (has no administration in time range)    Mobility non-ambulatory

## 2019-04-04 NOTE — Discharge Instructions (Signed)
 Dr. Virtie Bungert Adult Hip & Knee Specialist [facility] [address] , [place] [address] [phone]   POSTOPERATIVE DIRECTIONS    Hip Rehabilitation, Guidelines Following Surgery   WEIGHT BEARING Weight bearing as tolerated with assist device (walker, cane, etc) as directed, use it as long as suggested by your surgeon or therapist, typically at least 4-6 weeks.   HOME CARE INSTRUCTIONS  Remove items at home which could result in a fall. This includes throw rugs or furniture in walking pathways.  Continue medications as instructed at time of discharge.  You may have some home medications which will be placed on hold until you complete the course of blood thinner medication.  4 days after discharge, you may start showering. No tub baths or soaking your incisions. Do not put on socks or shoes without following the instructions of your caregivers.   Sit on chairs with arms. Use the chair arms to help push yourself up when arising.  Arrange for the use of a toilet seat elevator so you are not sitting low.   Walk with walker as instructed.  You may resume a sexual relationship in one month or when given the OK by your caregiver.  Use walker as long as suggested by your caregivers.  Avoid periods of inactivity such as sitting longer than an hour when not asleep. This helps prevent blood clots.  You may return to work once you are cleared by your surgeon.  Do not drive a car for 6 weeks or until released by your surgeon.  Do not drive while taking narcotics.  Wear elastic stockings for two weeks following surgery during the day but you may remove then at night.  Make sure you keep all of your appointments after your operation with all of your doctors and caregivers. You should call the office at the above phone number and make an appointment for approximately two weeks after the date of your surgery. Please pick up a stool softener and laxative  for home use as long as you are requiring pain medications.  ICE to the affected hip every three hours for 30 minutes at a time and then as needed for pain and swelling. Continue to use ice on the hip for pain and swelling from surgery. You may notice swelling that will progress down to the foot and ankle.  This is normal after surgery.  Elevate the leg when you are not up walking on it.   It is important for you to complete the blood thinner medication as prescribed by your doctor.  Continue to use the breathing machine which will help keep your temperature down.  It is common for your temperature to cycle up and down following surgery, especially at night when you are not up moving around and exerting yourself.  The breathing machine keeps your lungs expanded and your temperature down.  RANGE OF MOTION AND STRENGTHENING EXERCISES  These exercises are designed to help you keep full movement of your hip joint. Follow your caregiver's or physical therapist's instructions. Perform all exercises about fifteen times, three times per day or as directed. Exercise both hips, even if you have had only one joint replacement. These exercises can be done on a training (exercise) mat, on the floor, on a table or on a bed. Use whatever works the best and is most comfortable for you. Use music or television while you are exercising so that the exercises are a pleasant break in your day. This   will make your life better with the exercises acting as a break in routine you can look forward to.  Lying on your back, slowly slide your foot toward your buttocks, raising your knee up off the floor. Then slowly slide your foot back down until your leg is straight again.  Lying on your back spread your legs as far apart as you can without causing discomfort.  Lying on your side, raise your upper leg and foot straight up from the floor as far as is comfortable. Slowly lower the leg and repeat.  Lying on your back, tighten up the  muscle in the front of your thigh (quadriceps muscles). You can do this by keeping your leg straight and trying to raise your heel off the floor. This helps strengthen the largest muscle supporting your knee.  Lying on your back, tighten up the muscles of your buttocks both with the legs straight and with the knee bent at a comfortable angle while keeping your heel on the floor.   SKILLED REHAB INSTRUCTIONS: If the patient is transferred to a skilled rehab facility following release from the hospital, a list of the current medications will be sent to the facility for the patient to continue.  When discharged from the skilled rehab facility, please have the facility set up the patient's Home Health Physical Therapy prior to being released. Also, the skilled facility will be responsible for providing the patient with their medications at time of release from the facility to include their pain medication and their blood thinner medication. If the patient is still at the rehab facility at time of the two week follow up appointment, the skilled rehab facility will also need to assist the patient in arranging follow up appointment in our office and any transportation needs.  MAKE SURE YOU:  Understand these instructions.  Will watch your condition.  Will get help right away if you are not doing well or get worse.  Pick up stool softner and laxative for home use following surgery while on pain medications. Daily dry dressing changes as needed. In 4 days, you may remove your dressings and begin taking showers - no tub baths or soaking the incisions. Continue to use ice for pain and swelling after surgery. Do not use any lotions or creams on the incision until instructed by your surgeon.   

## 2019-04-04 NOTE — ED Notes (Signed)
Rpt called to [employer]

## 2019-04-04 NOTE — Anesthesia Postprocedure Evaluation (Signed)
Anesthesia Post Note  Patient: Personnel officer  Procedure(s) Performed: INTRAMEDULLARY (IM) NAIL INTERTROCHANTRIC (Right Hip)     Patient location during evaluation: PACU Anesthesia Type: General Level of consciousness: awake and alert Pain management: pain level controlled Vital Signs Assessment: post-procedure vital signs reviewed and stable Respiratory status: spontaneous breathing, nonlabored ventilation and respiratory function stable Cardiovascular status: blood pressure returned to baseline and stable Postop Assessment: no apparent nausea or vomiting Anesthetic complications: no    Last Vitals:  Vitals:   04/04/19 1700 04/04/19 1715  BP: (!) 163/81 117/72  Pulse: 78 81  Resp: 17 16  Temp:    SpO2: 93% 95%    Last Pain:  Vitals:   04/04/19 1700  TempSrc:   PainSc: Asleep                 Lynda Rainwater

## 2019-04-04 NOTE — ED Notes (Signed)
Patient is resting comfortably. 

## 2019-04-04 NOTE — Op Note (Addendum)
OPERATIVE REPORT  SURGEON: Rod Can, MD   ASSISTANT: Sherlean Foot, RNFA  PREOPERATIVE DIAGNOSIS: Right intertrochanteric femur fracture.   POSTOPERATIVE DIAGNOSIS: Right intertrochanteric femur fracture.   PROCEDURE: Intramedullary fixation, Right femur.   IMPLANTS: Stryker Gamma3 Hip Fracture Nail, 11 by 180 mm, 125 degrees. 10.5 x 95 mm Hip Fracture Nail Lag Screw. 5 x 32.5 mm distal interlocking screw 1.  ANESTHESIA:  General  ESTIMATED BLOOD LOSS:-50 mL    ANTIBIOTICS: 2 g Ancef.  DRAINS: None.  COMPLICATIONS: None.   CONDITION: PACU - hemodynamically stable.   BRIEF CLINICAL NOTE: Haley Cohen is a 85 y.o. female who presented with an intertrochanteric femur fracture. The patient was admitted to the hospitalist service and underwent perioperative risk stratification and medical optimization. The risks, benefits, and alternatives to the procedure were explained, and the patient elected to proceed.  PROCEDURE IN DETAIL: Surgical site was marked by myself. The patient was taken to the operating room and anesthesia was induced on the bed. The patient was then transferred to the [facility] table and the nonoperative lower extremity was scissored underneath the operative side. The fracture was reduced with traction, internal rotation, and adduction. The hip was prepped and draped in the normal sterile surgical fashion. Timeout was called verifying side and site of surgery. Preop antibiotics were given with 60 minutes of beginning the procedure.  Fluoroscopy was used to define the patient's anatomy. A 4 cm incision was made just proximal to the tip of the greater trochanter. The awl was used to obtain the standard starting point for a trochanteric entry nail under fluoroscopic control. The guidepin was placed. The entry reamer was used to open the proximal femur.  On the back table, the nail was assembled onto the jig. The nail was placed into the femur without any difficulty.  Through a separate stab incision, the cannula was placed down to the bone in preparation for the cephalomedullary device. A guidepin was placed into the femoral head using AP and lateral fluoroscopy views. The pin was measured, and then reaming was performed to the appropriate depth. The lag screw was inserted to the appropriate depth. The fracture was compressed through the jig. The setscrew was tightened and then loosened one quarter turn. A separate stab incision was created, and the distal interlocking screw was placed using standard AO technique. The jig was removed. Final AP and lateral fluoroscopy views were obtained to confirm fracture reduction and hardware placement. Tip apex distance was appropriate. There was no chondral penetration.  The wounds were copiously irrigated with saline. The wound was closed in layers with #1 Vicryl for the fascia, 2-0 Monocryl for the deep dermal layer, and 3-0 Monocryl subcuticular stitch. Glue was applied to the skin. Once the glue was fully hardened, sterile dressing was applied. The patient was then awakened from anesthesia and taken to the PACU in stable condition. Sponge needle and instrument counts were correct at the end of the case 2. There were no known complications.  We will readmit the patient to the hospitalist. Weightbearing status will be weightbearing as tolerated with a walker. We will begin Lovenox for DVT prophylaxis. The patient will work with physical therapy and undergo disposition planning.

## 2019-04-04 NOTE — ED Provider Notes (Addendum)
[facility] DEPT Provider Note   CSN: [id number] Arrival date & time: 04/03/19  2352     History   Chief Complaint No chief complaint on file.   HPI Haley Cohen is a 85 y.o. female.     Patient presents to the emergency department for evaluation after a fall.  Patient reports that she was getting ready for bed and tripped on her walker.  She fell to the right side.  She is complaining mainly of pain in the right hip and upper leg area but also back pain.  Patient given 100 mcg of fentanyl IV by EMS during transport.  She still reports significant pain in the leg.     Past Medical History:  Diagnosis Date   Arthritis     There are no active problems to display for this patient.      OB History   No obstetric history on file.      Home Medications    Prior to Admission medications   Medication Sig Start Date End Date Taking? Authorizing Provider  acetaminophen (TYLENOL) 325 MG tablet Take 650 mg by mouth every 8 (eight) hours as needed for mild pain.   Yes [provider]  cholecalciferol (VITAMIN D3) 25 MCG (1000 UT) tablet Take 1,000 Units by mouth daily.   Yes [provider]  cloNIDine (CATAPRES - DOSED IN MG/24 HR) 0.1 mg/24hr patch Place 0.1 mg onto the skin once a week. On Mondays   Yes [provider]  divalproex (DEPAKOTE SPRINKLE) 125 MG capsule Take 125 mg by mouth 2 (two) times daily.   Yes [provider]  DULoxetine (CYMBALTA) 30 MG capsule Take 30 mg by mouth daily.   Yes [provider]  levothyroxine (SYNTHROID) 25 MCG tablet Take 25 mcg by mouth daily before breakfast.   Yes [provider]  Melatonin 5 MG TABS Take 5 mg by mouth at bedtime as needed (sleep).   Yes [provider]  traZODone (DESYREL) 50 MG tablet Take 25 mg by mouth at bedtime.   Yes [provider]  vitamin B-12 (CYANOCOBALAMIN) 1000 MCG tablet Take 1,000 mcg by mouth daily.    Yes [provider]  vitamin C (ASCORBIC ACID) 500 MG tablet Take 500 mg by mouth daily.   Yes [provider]    Family History No family history on file.  Social History Social History   Tobacco Use   Smoking status: Never Smoker   Smokeless tobacco: Never Used  Substance Use Topics   Alcohol use: Not Currently   Drug use: Never     Allergies   Patient has no known allergies.   Review of Systems Review of Systems  Musculoskeletal: Positive for arthralgias and back pain.  All other systems reviewed and are negative.    Physical Exam Updated Vital Signs BP (!) 162/81    Pulse 73    Temp 98.1 F (36.7 C) (Oral)    Resp 18    SpO2 98%   Physical Exam Vitals signs and nursing note reviewed.  Constitutional:      General: She is not in acute distress.    Appearance: Normal appearance. She is well-developed.  HENT:     Head: Normocephalic and atraumatic.     Right Ear: Hearing normal.     Left Ear: Hearing normal.     Nose: Nose normal.  Eyes:     Conjunctiva/sclera: Conjunctivae normal.     Pupils: Pupils are equal,  round, and reactive to light.  Neck:     Musculoskeletal: Normal range of motion and neck supple.  Cardiovascular:     Rate and Rhythm: Regular rhythm.     Heart sounds: S1 normal and S2 normal. No murmur. No friction rub. No gallop.   Pulmonary:     Effort: Pulmonary effort is normal. No respiratory distress.     Breath sounds: Normal breath sounds.  Chest:     Chest wall: No tenderness.  Abdominal:     General: Bowel sounds are normal.     Palpations: Abdomen is soft.     Tenderness: There is no abdominal tenderness. There is no guarding or rebound. Negative signs include Murphy's sign and McBurney's sign.     Hernia: No hernia is present.  Musculoskeletal:     Right hip: She exhibits decreased range of motion (Held slightly flexed and rotated outwards, severe pain with any movement) and tenderness.  Skin:    General:  Skin is warm and dry.     Findings: No rash.  Neurological:     Mental Status: She is alert and oriented to person, place, and time.     GCS: GCS eye subscore is 4. GCS verbal subscore is 5. GCS motor subscore is 6.     Cranial Nerves: No cranial nerve deficit.     Sensory: No sensory deficit.     Coordination: Coordination normal.  Psychiatric:        Speech: Speech normal.        Behavior: Behavior normal.        Thought Content: Thought content normal.      ED Treatments / Results  Labs (all labs ordered are listed, but only abnormal results are displayed) Labs Reviewed  BASIC METABOLIC PANEL - Abnormal; Notable for the following components:      Result Value   Glucose, Bld 132 (*)    All other components within normal limits  CBC - Abnormal; Notable for the following components:   WBC 13.3 (*)    All other components within normal limits  SARS CORONAVIRUS 2 (HOSPITAL ORDER, [facility])  [place]    EKG None  Radiology Dg Chest 1 View  Result Date: 04/04/2019 CLINICAL DATA:  Fall EXAM: CHEST  1 VIEW COMPARISON:  None. FINDINGS: Low lung volumes. Linear atelectasis or scarring at the left mid lung and left lung base. Right lung is clear. Normal heart size. No pneumothorax. IMPRESSION: Low lung volumes with atelectasis and or scarring in the left mid lung and left lung base Electronically Signed   By: Donavan Foil M.D.   On: 04/04/2019 03:38   Dg Thoracic Spine 2 View  Result Date: 04/04/2019 CLINICAL DATA:  Fall EXAM: THORACIC SPINE 2 VIEWS COMPARISON:  None. FINDINGS: Limited by positioning. Thoracic alignment grossly within normal limits. Vertebral body heights appear maintained. Diffuse osteophytosis of the thoracic spine. IMPRESSION: No definite acute osseous abnormality. Electronically Signed   By: Donavan Foil M.D.   On: 04/04/2019 03:45   Dg Lumbar Spine Complete  Result Date: 04/04/2019 CLINICAL DATA:  Fall EXAM: LUMBAR SPINE -  COMPLETE 4+ VIEW COMPARISON:  None. FINDINGS: Transitional anatomy suspected with 4 non rib-bearing vertebra. First non rib-bearing lumbar vertebra will be designated L1. Left lateral positioning of L2 with respect to L3 on the frontal view. This is of uncertain chronicity. Suspected lucency about the right posterior rod and fixating screws at L3-L4. Vertebral body heights are grossly maintained. Prominent  degenerative changes at L3-L4. Diffuse osteophytes of the lumbar spine. IMPRESSION: 1. Suspected transitional anatomy. First non rib-bearing lumbar vertebra is designated L1 for the purposes of reporting 2. Somewhat abrupt appearing left lateral displacement of L2 with respect to L3 on AP view of uncertain chronicity. There is posterior rod and fixating screws at L3-L4 with suspected lucency/loosening about the right pedicle screws, and possible partial dislodgement of the rods and fixating screws on the right side. No gross acute fracture is seen. Electronically Signed   By: Donavan Foil M.D.   On: 04/04/2019 03:43   Ct Head Wo Contrast  Result Date: 04/04/2019 CLINICAL DATA:  85 year old female with fall EXAM: CT HEAD WITHOUT CONTRAST CT CERVICAL SPINE WITHOUT CONTRAST TECHNIQUE: Multidetector CT imaging of the head and cervical spine was performed following the standard protocol without intravenous contrast. Multiplanar CT image reconstructions of the cervical spine were also generated. COMPARISON:  None. FINDINGS: CT HEAD FINDINGS Brain: There is mild age-related atrophy and chronic microvascular ischemic changes. There is no acute intracranial. No mass effect or midline shift. No extra-axial fluid collection. Vascular: No hyperdense vessel or unexpected calcification. Skull: Normal. Negative for fracture or focal lesion. Sinuses/Orbits: No acute finding. Other: None CT CERVICAL SPINE FINDINGS Evaluation of this exam is limited due to motion artifact. Alignment: No acute subluxation. There is grade 1  C4-C5 anterolisthesis. Skull base and vertebrae: No definite acute fracture. C4 and C5 laminectomy changes. Soft tissues and spinal canal: No prevertebral fluid or swelling. No visible canal hematoma. Disc levels: Multilevel degenerative changes and disc disease with endplate irregularity and disc space narrowing. Multilevel facet arthropathy. Upper chest: Negative. Other: Bilateral carotid bulb calcified plaques. IMPRESSION: 1. No acute intracranial hemorrhage. 2. Mild age-related atrophy and chronic microvascular ischemic changes. 3. No acute/traumatic cervical spine pathology. Electronically Signed   By: Anner Crete M.D.   On: 04/04/2019 03:43   Ct Cervical Spine Wo Contrast  Result Date: 04/04/2019 CLINICAL DATA:  85 year old female with fall EXAM: CT HEAD WITHOUT CONTRAST CT CERVICAL SPINE WITHOUT CONTRAST TECHNIQUE: Multidetector CT imaging of the head and cervical spine was performed following the standard protocol without intravenous contrast. Multiplanar CT image reconstructions of the cervical spine were also generated. COMPARISON:  None. FINDINGS: CT HEAD FINDINGS Brain: There is mild age-related atrophy and chronic microvascular ischemic changes. There is no acute intracranial. No mass effect or midline shift. No extra-axial fluid collection. Vascular: No hyperdense vessel or unexpected calcification. Skull: Normal. Negative for fracture or focal lesion. Sinuses/Orbits: No acute finding. Other: None CT CERVICAL SPINE FINDINGS Evaluation of this exam is limited due to motion artifact. Alignment: No acute subluxation. There is grade 1 C4-C5 anterolisthesis. Skull base and vertebrae: No definite acute fracture. C4 and C5 laminectomy changes. Soft tissues and spinal canal: No prevertebral fluid or swelling. No visible canal hematoma. Disc levels: Multilevel degenerative changes and disc disease with endplate irregularity and disc space narrowing. Multilevel facet arthropathy. Upper chest: Negative.  Other: Bilateral carotid bulb calcified plaques. IMPRESSION: 1. No acute intracranial hemorrhage. 2. Mild age-related atrophy and chronic microvascular ischemic changes. 3. No acute/traumatic cervical spine pathology. Electronically Signed   By: Anner Crete M.D.   On: 04/04/2019 03:43   Dg Hip Unilat With Pelvis 2-3 Views Right  Result Date: 04/04/2019 CLINICAL DATA:  Fall with hip pain EXAM: DG HIP (WITH OR WITHOUT PELVIS) 2-3V RIGHT COMPARISON:  None. FINDINGS: Right SI joint is non widened. Pubic symphysis is intact. Acute comminuted right intertrochanteric fracture. Right femoral  head projects in joint. Skin fold artifact versus nondisplaced fracture over the right inferior pubic ramus. IMPRESSION: 1. Acute comminuted right intertrochanteric fracture. 2. Skin fold artifact versus nondisplaced fracture over the right inferior pubic ramus Electronically Signed   By: Donavan Foil M.D.   On: 04/04/2019 03:47    Procedures Procedures (including critical care time)  Medications Ordered in ED Medications - No data to display   Initial Impression / Assessment and Plan / ED Course  I have reviewed the triage vital signs and the nursing notes.  Pertinent labs & imaging results that were available during my care of the patient were reviewed by me and considered in my medical decision making (see chart for details).        Patient presents to the emergency department for evaluation of right hip pain after a fall.  Patient reports that she was walking, using her walker when she got tangled up and fell.  She fell onto her right side, injuring the right hip.  Patient presented with severe pain with minimal movement of the hip.  X-ray confirms intertrochanteric hip fracture.  Initially complained of back pain, then denied back pain, then complained about pain again.  She does have some dementia and is not reliable.  No significant tenderness on exam.  She has had previous surgery.  X-ray of thoracic  and lumbar spine does not show acute fracture although there is some malalignment noted in the lumbar spine.  No neurologic findings on examination.  Patient will require hospitalization for surgical treatment of her right hip fracture.  Discussed with Dr. Lucia Gaskins, on-call for orthopedics.  Request patient to stay n.p.o., likely repair today.  Final Clinical Impressions(s) / ED Diagnoses   Final diagnoses:  Closed right hip fracture, initial encounter [facility])    ED Discharge Orders    None       Orpah Greek, MD 04/04/19 [id number]    Orpah Greek, MD 04/04/19 [phone]

## 2019-04-04 NOTE — Progress Notes (Signed)
PROGRESS NOTE    Haley Cohen  [id number] DOB: [birth year] DOA: 04/03/2019 PCP: [name], MD    Brief Narrative:  85 y.o. female with medical history significant of hypertension, arthritis presenting to the hospital via EMS from her nursing home for evaluation after a fall.  Patient states she was getting ready for bed, using a walker to ambulate and somehow fell.  Since the fall she has been having pain in her back and right hip.  She does not recall having chest pain, lightheadedness, or shortness of breath prior to the fall.  Denies loss of consciousness.  Assessment & Plan:   Principal Problem:   Hip fracture ([place]) Active Problems:   Leukocytosis   HTN (hypertension)   Depression   Hypothyroidism  Acute right hip fracture secondary to mechanical fall -X-ray of right hip/pelvis showing acute comminuted right intertrochanteric fracture. -Orthopedic Surgery consulted and pt is now s/p surgery 8/27 -PT/OT eval pending  Leukocytosis -Likely reactive. Afebrile. -Repeat CBC in AM  Hypertension -Blood pressure elevated with systolic in the [id number]. -Continue with hydralazine PRN  Depression -Continue Cymbalta as tolerated  Hypothyroidism -Continue Synthroid as pt tolerates  Dementia -Stable, pt unable to provide any meaningful detailed history at this time  DVT prophylaxis: Lovenox, scd's Code Status: Full Family Communication: Pt in room, family not at bedside Disposition Plan: uncertain at this time  Consultants:   Orthopedic Surgery  Procedures:  Intramedullary fixation, Right femur 8/27  Antimicrobials: Anti-infectives (From admission, onward)   Start     Dose/Rate Route Frequency Ordered Stop   04/04/19 2000  ceFAZolin (ANCEF) IVPB 2g/100 mL premix     2 g 200 mL/hr over 30 Minutes Intravenous Every 6 hours 04/04/19 1757 04/05/19 0759   04/04/19 0815  ceFAZolin (ANCEF) IVPB 2g/100 mL premix     2 g 200 mL/hr over 30 Minutes Intravenous  On call to O.R. 04/04/19 0806 04/04/19 1458       Subjective: Confused, complains of hip pain this AM  Objective: Vitals:   04/04/19 1700 04/04/19 1715 04/04/19 1730 04/04/19 1758  BP: (!) 163/81 117/72 138/63 138/72  Pulse: 78 81 88 97  Resp: 17 16 15 16   Temp:   97.8 F (36.6 C) 98.1 F (36.7 C)  TempSrc:    Axillary  SpO2: 93% 95% 94% 92%    Intake/Output Summary (Last 24 hours) at 04/04/2019 1828 Last data filed at 04/04/2019 1730 Gross per 24 hour  Intake 1439.56 ml  Output 1150 ml  Net 289.56 ml   There were no vitals filed for this visit.  Examination:  General exam: Appears calm and comfortable  Respiratory system: Clear to auscultation. Respiratory effort normal. Cardiovascular system: S1 & S2 heard, RRR. Gastrointestinal system: Abdomen is nondistended, soft and nontender. No organomegaly or masses felt. Normal bowel sounds heard. Central nervous system: Alert . No focal neurological deficits. Extremities: Symmetric 5 x 5 power. Skin: No rashes, lesions  Psychiatry: confused, anxious  Data Reviewed: I have personally reviewed following labs and imaging studies  CBC: Recent Labs  Lab 04/04/19 0340  WBC 13.3*  HGB 13.2  HCT 42.5  MCV 93.8  PLT [id number]   Basic Metabolic Panel: Recent Labs  Lab 04/04/19 0051  NA 139  K 3.7  CL 105  CO2 22  GLUCOSE 132*  BUN 13  CREATININE 0.83  CALCIUM 10.0   GFR: CrCl cannot be calculated (Unknown ideal weight.). Liver Function Tests: No results for input(s): AST, ALT, ALKPHOS, BILITOT,  PROT, ALBUMIN in the last 168 hours. No results for input(s): LIPASE, AMYLASE in the last 168 hours. No results for input(s): AMMONIA in the last 168 hours. Coagulation Profile: Recent Labs  Lab 04/04/19 0051  INR 0.9   Cardiac Enzymes: No results for input(s): CKTOTAL, CKMB, CKMBINDEX, TROPONINI in the last 168 hours. BNP (last 3 results) No results for input(s): PROBNP in the last 8760 hours. HbA1C: No results for  input(s): HGBA1C in the last 72 hours. CBG: No results for input(s): GLUCAP in the last 168 hours. Lipid Profile: No results for input(s): CHOL, HDL, LDLCALC, TRIG, CHOLHDL, LDLDIRECT in the last 72 hours. Thyroid Function Tests: No results for input(s): TSH, T4TOTAL, FREET4, T3FREE, THYROIDAB in the last 72 hours. Anemia Panel: No results for input(s): VITAMINB12, FOLATE, FERRITIN, TIBC, IRON, RETICCTPCT in the last 72 hours. Sepsis Labs: No results for input(s): PROCALCITON, LATICACIDVEN in the last 168 hours.  Recent Results (from the past 240 hour(s))  SARS Coronavirus 2 [facility] order, Performed in [facility] hospital lab) Nasopharyngeal Nasopharyngeal Swab     Status: None   Collection Time: 04/04/19  4:58 AM   Specimen: Nasopharyngeal Swab  Result Value Ref Range Status   SARS Coronavirus 2 NEGATIVE NEGATIVE Final    Comment: (NOTE) If result is NEGATIVE SARS-CoV-2 target nucleic acids are NOT DETECTED. The SARS-CoV-2 RNA is generally detectable in upper and lower  respiratory specimens during the acute phase of infection. The lowest  concentration of SARS-CoV-2 viral copies this assay can detect is 250  copies / mL. A negative result does not preclude SARS-CoV-2 infection  and should not be used as the sole basis for treatment or other  patient management decisions.  A negative result may occur with  improper specimen collection / handling, submission of specimen other  than nasopharyngeal swab, presence of viral mutation(s) within the  areas targeted by this assay, and inadequate number of viral copies  (<250 copies / mL). A negative result must be combined with clinical  observations, patient history, and epidemiological information. If result is POSITIVE SARS-CoV-2 target nucleic acids are DETECTED. The SARS-CoV-2 RNA is generally detectable in upper and lower  respiratory specimens dur ing the acute phase of infection.  Positive  results are indicative of active  infection with SARS-CoV-2.  Clinical  correlation with patient history and other diagnostic information is  necessary to determine patient infection status.  Positive results do  not rule out bacterial infection or co-infection with other viruses. If result is PRESUMPTIVE POSTIVE SARS-CoV-2 nucleic acids MAY BE PRESENT.   A presumptive positive result was obtained on the submitted specimen  and confirmed on repeat testing.  While 2019 novel coronavirus  (SARS-CoV-2) nucleic acids may be present in the submitted sample  additional confirmatory testing may be necessary for epidemiological  and / or clinical management purposes  to differentiate between  SARS-CoV-2 and other Sarbecovirus currently known to infect humans.  If clinically indicated additional testing with an alternate test  methodology [phone]) is advised. The SARS-CoV-2 RNA is generally  detectable in upper and lower respiratory sp ecimens during the acute  phase of infection. The expected result is Negative. Fact Sheet for Patients:  StrictlyIdeas.no Fact Sheet for Healthcare Providers: BankingDealers.co.za This test is not yet approved or cleared by the [place] FDA and has been authorized for detection and/or diagnosis of SARS-CoV-2 by FDA under an Emergency Use Authorization (EUA).  This EUA will remain in effect (meaning this test can be used) for  the duration of the COVID-19 declaration under Section 564(b)(1) of the Act, 21 U.S.C. section 360bbb-3(b)(1), unless the authorization is terminated or revoked sooner. Performed at [facility], [place] [address] [place], [place] [address]   Surgical pcr screen     Status: None   Collection Time: 04/04/19  1:04 PM   Specimen: Nasal Mucosa; Nasal Swab  Result Value Ref Range Status   MRSA, PCR NEGATIVE NEGATIVE Final   Staphylococcus aureus NEGATIVE NEGATIVE Final    Comment: (NOTE) The Xpert SA Assay  (FDA approved for NASAL specimens in patients 80 years of age and older), is one component of a comprehensive surveillance program. It is not intended to diagnose infection nor to guide or monitor treatment. Performed at [facility], [place] [address], [place], [place] [address]      Radiology Studies: Dg Chest 1 View  Result Date: 04/04/2019 CLINICAL DATA:  Fall EXAM: CHEST  1 VIEW COMPARISON:  None. FINDINGS: Low lung volumes. Linear atelectasis or scarring at the left mid lung and left lung base. Right lung is clear. Normal heart size. No pneumothorax. IMPRESSION: Low lung volumes with atelectasis and or scarring in the left mid lung and left lung base Electronically Signed   By: Donavan Foil M.D.   On: 04/04/2019 03:38   Dg Thoracic Spine 2 View  Result Date: 04/04/2019 CLINICAL DATA:  Fall EXAM: THORACIC SPINE 2 VIEWS COMPARISON:  None. FINDINGS: Limited by positioning. Thoracic alignment grossly within normal limits. Vertebral body heights appear maintained. Diffuse osteophytosis of the thoracic spine. IMPRESSION: No definite acute osseous abnormality. Electronically Signed   By: Donavan Foil M.D.   On: 04/04/2019 03:45   Dg Lumbar Spine Complete  Result Date: 04/04/2019 CLINICAL DATA:  Fall EXAM: LUMBAR SPINE - COMPLETE 4+ VIEW COMPARISON:  None. FINDINGS: Transitional anatomy suspected with 4 non rib-bearing vertebra. First non rib-bearing lumbar vertebra will be designated L1. Left lateral positioning of L2 with respect to L3 on the frontal view. This is of uncertain chronicity. Suspected lucency about the right posterior rod and fixating screws at L3-L4. Vertebral body heights are grossly maintained. Prominent degenerative changes at L3-L4. Diffuse osteophytes of the lumbar spine. IMPRESSION: 1. Suspected transitional anatomy. First non rib-bearing lumbar vertebra is designated L1 for the purposes of reporting 2. Somewhat abrupt appearing left lateral displacement of  L2 with respect to L3 on AP view of uncertain chronicity. There is posterior rod and fixating screws at L3-L4 with suspected lucency/loosening about the right pedicle screws, and possible partial dislodgement of the rods and fixating screws on the right side. No gross acute fracture is seen. Electronically Signed   By: Donavan Foil M.D.   On: 04/04/2019 03:43   Ct Head Wo Contrast  Result Date: 04/04/2019 CLINICAL DATA:  85 year old female with fall EXAM: CT HEAD WITHOUT CONTRAST CT CERVICAL SPINE WITHOUT CONTRAST TECHNIQUE: Multidetector CT imaging of the head and cervical spine was performed following the standard protocol without intravenous contrast. Multiplanar CT image reconstructions of the cervical spine were also generated. COMPARISON:  None. FINDINGS: CT HEAD FINDINGS Brain: There is mild age-related atrophy and chronic microvascular ischemic changes. There is no acute intracranial. No mass effect or midline shift. No extra-axial fluid collection. Vascular: No hyperdense vessel or unexpected calcification. Skull: Normal. Negative for fracture or focal lesion. Sinuses/Orbits: No acute finding. Other: None CT CERVICAL SPINE FINDINGS Evaluation of this exam is limited due to motion artifact. Alignment: No acute subluxation. There is grade 1  C4-C5 anterolisthesis. Skull base and vertebrae: No definite acute fracture. C4 and C5 laminectomy changes. Soft tissues and spinal canal: No prevertebral fluid or swelling. No visible canal hematoma. Disc levels: Multilevel degenerative changes and disc disease with endplate irregularity and disc space narrowing. Multilevel facet arthropathy. Upper chest: Negative. Other: Bilateral carotid bulb calcified plaques. IMPRESSION: 1. No acute intracranial hemorrhage. 2. Mild age-related atrophy and chronic microvascular ischemic changes. 3. No acute/traumatic cervical spine pathology. Electronically Signed   By: Anner Crete M.D.   On: 04/04/2019 03:43   Ct Cervical  Spine Wo Contrast  Result Date: 04/04/2019 CLINICAL DATA:  85 year old female with fall EXAM: CT HEAD WITHOUT CONTRAST CT CERVICAL SPINE WITHOUT CONTRAST TECHNIQUE: Multidetector CT imaging of the head and cervical spine was performed following the standard protocol without intravenous contrast. Multiplanar CT image reconstructions of the cervical spine were also generated. COMPARISON:  None. FINDINGS: CT HEAD FINDINGS Brain: There is mild age-related atrophy and chronic microvascular ischemic changes. There is no acute intracranial. No mass effect or midline shift. No extra-axial fluid collection. Vascular: No hyperdense vessel or unexpected calcification. Skull: Normal. Negative for fracture or focal lesion. Sinuses/Orbits: No acute finding. Other: None CT CERVICAL SPINE FINDINGS Evaluation of this exam is limited due to motion artifact. Alignment: No acute subluxation. There is grade 1 C4-C5 anterolisthesis. Skull base and vertebrae: No definite acute fracture. C4 and C5 laminectomy changes. Soft tissues and spinal canal: No prevertebral fluid or swelling. No visible canal hematoma. Disc levels: Multilevel degenerative changes and disc disease with endplate irregularity and disc space narrowing. Multilevel facet arthropathy. Upper chest: Negative. Other: Bilateral carotid bulb calcified plaques. IMPRESSION: 1. No acute intracranial hemorrhage. 2. Mild age-related atrophy and chronic microvascular ischemic changes. 3. No acute/traumatic cervical spine pathology. Electronically Signed   By: Anner Crete M.D.   On: 04/04/2019 03:43   Pelvis Portable  Result Date: 04/04/2019 CLINICAL DATA:  Post right hip ORIF EXAM: PORTABLE PELVIS 1-2 VIEWS COMPARISON:  Same day radiographs and intraoperative fluoroscopy FINDINGS: Radiograph demonstrates the placement of a right femoral intramedullary rod with transcervical cancellous fixation screw. Alignment across the fracture line is near anatomic. There is increased  retraction of the fracture fragment involving the lesser trochanter of the right proximal femur. Overlying soft tissue swelling is noted. The left femoral head remains normally located with mild left hip arthrosis. Remaining bones are unremarkable. Foley catheter in place. IMPRESSION: Right femoral ORIF without evidence of hardware complication. Increased retraction of fracture fragment involving the right lesser trochanter Electronically Signed   By: Lovena Le M.D.   On: 04/04/2019 17:54   Dg Knee Right Port  Result Date: 04/04/2019 CLINICAL DATA:  Right knee pain after fall. EXAM: PORTABLE RIGHT KNEE - 1-2 VIEW COMPARISON:  None. FINDINGS: Status post right total knee arthroplasty. The right femoral and tibial components appear to be well situated. No joint effusion is noted. No fracture or dislocation is noted. No soft tissue abnormality is noted. IMPRESSION: Status post right total knee arthroplasty. No acute abnormality seen in the right knee. Electronically Signed   By: Marijo Conception M.D.   On: 04/04/2019 08:50   Dg C-arm 1-60 Min-no Report  Result Date: 04/04/2019 Fluoroscopy was utilized by the requesting physician.  No radiographic interpretation.   Dg Hip Operative Unilat W Or W/o Pelvis Right  Result Date: 04/04/2019 CLINICAL DATA:  Intramedullary nail placement for right femoral fracture EXAM: OPERATIVE RIGHT HIP (WITH PELVIS IF PERFORMED) 4 VIEWS TECHNIQUE: Fluoroscopic spot image(s)  were submitted for interpretation post-operatively. COMPARISON:  Radiographs April 04, 2019 FINDINGS: Initial frontal and frogleg lateral views demonstrate and intertrochanteric right femur fracture. Subsequent images demonstrate the placement a short stem right femoral intramedullary nail with a transcervical cancellous screw and a distal threaded secure screw within the proximal femoral diaphysis. Post fixation alignment is near anatomic. Expected postsurgical soft tissue changes are noted. IMPRESSION:  Right femoral intramedullary nail placement with near anatomic alignment. Electronically Signed   By: Lovena Le M.D.   On: 04/04/2019 17:52   Dg Hip Unilat With Pelvis 2-3 Views Right  Result Date: 04/04/2019 CLINICAL DATA:  Fall with hip pain EXAM: DG HIP (WITH OR WITHOUT PELVIS) 2-3V RIGHT COMPARISON:  None. FINDINGS: Right SI joint is non widened. Pubic symphysis is intact. Acute comminuted right intertrochanteric fracture. Right femoral head projects in joint. Skin fold artifact versus nondisplaced fracture over the right inferior pubic ramus. IMPRESSION: 1. Acute comminuted right intertrochanteric fracture. 2. Skin fold artifact versus nondisplaced fracture over the right inferior pubic ramus Electronically Signed   By: Donavan Foil M.D.   On: 04/04/2019 03:47    Scheduled Meds:  divalproex  125 mg Oral BID   docusate sodium  100 mg Oral BID   DULoxetine  30 mg Oral Daily   [START ON 04/05/2019] enoxaparin (LOVENOX) injection  40 mg Subcutaneous Q24H   levothyroxine  25 mcg Oral Q0600   senna  1 tablet Oral BID   traZODone  25 mg Oral QHS   vitamin B-12  1,000 mcg Oral Daily   vitamin C  500 mg Oral Daily   Continuous Infusions:   ceFAZolin (ANCEF) IV       LOS: 0 days   Marylu Lund, MD [facility] Pager On Amion  If 7PM-7AM, please contact night-coverage 04/04/2019, 6:28 PM

## 2019-04-04 NOTE — NC FL2 (Addendum)
[place] MEDICAID FL2 LEVEL OF CARE SCREENING TOOL     IDENTIFICATION  Patient Name: Haley Cohen Birthdate: April 06, 1932 Sex: female Admission Date (Current Location): 04/03/2019  [facility] and [place] Number:  Herbalist and Address:  [facility],  [place], [place]      Provider Number: [id number]  Attending Physician Name and Address:  Donne Hazel, MD  Relative Name and Phone Number:       Current Level of Care: Hospital Recommended Level of Care: [place] Prior Approval Number:    Date Approved/Denied:   PASRR Number:   [id number]  Discharge Plan: SNF    Current Diagnoses: Patient Active Problem List   Diagnosis Date Noted  . Hip fracture ([place]) 04/04/2019  . Leukocytosis 04/04/2019  . HTN (hypertension) 04/04/2019  . Depression 04/04/2019  . Hypothyroidism 04/04/2019    Orientation RESPIRATION BLADDER Height & Weight     Self,  Place  Normal Continent Weight:   Height:     BEHAVIORAL SYMPTOMS/MOOD NEUROLOGICAL BOWEL NUTRITION STATUS      Continent Diet(regular)  AMBULATORY STATUS COMMUNICATION OF NEEDS Skin   Extensive Assist(pt x5 weekly) Verbally Normal                       Personal Care Assistance Level of Assistance  Bathing, Feeding, Dressing Bathing Assistance: Limited assistance Feeding assistance: Limited assistance Dressing Assistance: Limited assistance     Functional Limitations Info             SPECIAL CARE FACTORS FREQUENCY   PT OT  5x  5x                Contractures Contractures Info: Not present    Additional Factors Info  Code Status Code Status Info: full             Current Medications (04/04/2019):  This is the current hospital active medication list Current Facility-Administered Medications  Medication Dose Route Frequency Provider Last Rate Last Dose  . 0.9 %  sodium chloride infusion   Intravenous Continuous Shela Leff, MD 125  mL/hr at 04/04/19 [phone]    . ceFAZolin (ANCEF) IVPB 2g/100 mL premix  2 g Intravenous On Call to OR Swinteck, Aaron Edelman, MD      . chlorhexidine (HIBICLENS) 4 % liquid 4 application  60 mL Topical Once Swinteck, Aaron Edelman, MD      . cholecalciferol (VITAMIN D3) tablet 1,000 Units  1,000 Units Oral Daily Shela Leff, MD      . divalproex (DEPAKOTE SPRINKLE) capsule 125 mg  125 mg Oral BID Shela Leff, MD      . DULoxetine (CYMBALTA) DR capsule 30 mg  30 mg Oral Daily Shela Leff, MD      . feeding supplement (ENSURE PRE-SURGERY) liquid 296 mL  296 mL Oral Once Rod Can, MD      . hydrALAZINE (APRESOLINE) injection 5 mg  5 mg Intravenous Q4H PRN Shela Leff, MD      . HYDROcodone-acetaminophen (NORCO/VICODIN) 5-325 MG per tablet 1-2 tablet  1-2 tablet Oral Q6H PRN Shela Leff, MD      . levothyroxine (SYNTHROID) tablet 25 mcg  25 mcg Oral Q0600 Shela Leff, MD      . Melatonin TABS 5 mg  5 mg Oral QHS PRN Shela Leff, MD      . morphine 2 MG/ML injection 0.5 mg  0.5 mg Intravenous Q2H PRN Shela Leff, MD   0.5 mg  at 04/04/19 [id number]  . povidone-iodine 10 % swab 2 application  2 application Topical Once Swinteck, Aaron Edelman, MD      . tranexamic acid (CYKLOKAPRON) IVPB 1,000 mg  1,000 mg Intravenous To OR Swinteck, Aaron Edelman, MD      . traZODone (DESYREL) tablet 25 mg  25 mg Oral QHS Shela Leff, MD      . vitamin B-12 (CYANOCOBALAMIN) tablet 1,000 mcg  1,000 mcg Oral Daily Shela Leff, MD      . vitamin C (ASCORBIC ACID) tablet 500 mg  500 mg Oral Daily Shela Leff, MD         Discharge Medications: TAKE these medications   cholecalciferol 25 MCG (1000 UT) tablet Commonly known as: VITAMIN D3 Take 1,000 Units by mouth daily.   divalproex 125 MG capsule Commonly known as: DEPAKOTE SPRINKLE Take 125 mg by mouth 2 (two) times daily.   docusate sodium 100 MG capsule Commonly known as: COLACE Take 1 capsule (100 mg total) by  mouth 2 (two) times daily.   DULoxetine 30 MG capsule Commonly known as: CYMBALTA Take 30 mg by mouth daily.   enoxaparin 40 MG/0.4ML injection Commonly known as: LOVENOX Inject 0.4 mLs (40 mg total) into the skin daily.   HYDROcodone-acetaminophen 5-325 MG tablet Commonly known as: NORCO/VICODIN Take 1 tablet by mouth every 4 (four) hours as needed for moderate pain.   levothyroxine 25 MCG tablet Commonly known as: SYNTHROID Take 25 mcg by mouth daily before breakfast.   Melatonin 5 MG Tabs Take 5 mg by mouth at bedtime as needed (sleep).   traZODone 50 MG tablet Commonly known as: DESYREL Take 25 mg by mouth at bedtime.   vitamin B-12 1000 MCG tablet Commonly known as: CYANOCOBALAMIN Take 1,000 mcg by mouth daily.   vitamin C 500 MG tablet Commonly known as: ASCORBIC ACID Take 500 mg by mouth daily.     Relevant Imaging Results:  Relevant Lab Results:   Additional Information ssn [id number]  Follow-up Information    Swinteck, Aaron Edelman, MD. Schedule an appointment as soon as possible for a visit in 2 weeks.   Specialty: Orthopedic Surgery Why: For wound re-check Contact information: [address] [place] [address] [phone]        Follow up with PCP in 1-2 weeks Follow up.         Leeroy Cha, RN

## 2019-04-04 NOTE — ED Triage Notes (Signed)
Pt arrived via EMS from [facility], pt stated she suffered a fall while using her walker and landed on her side, also states she is suffering from low back pain. EMS states shortening and rotation of Right leg, pt also has a C collar in place.  BP 171/95  HR 77 Spo2 92 Temp 97.2 Cbg 140

## 2019-04-04 NOTE — H&P (Signed)
History and Physical    Providence Haggerty [id number] DOB: 11/30/1931 DOA: 04/03/2019  PCP: [name], MD Patient [facility] from: [facility]  Chief Complaint: Lytle Michaels  HPI: Haley Cohen is a 85 y.o. female with medical history significant of hypertension, arthritis presenting to the hospital via EMS from her nursing home for evaluation after a fall.  Patient states she was getting ready for bed, using a walker to ambulate and somehow fell.  Since the fall she has been having pain in her back and right hip.  She does not recall having chest pain, lightheadedness, or shortness of breath prior to the fall.  Denies loss of consciousness.  No additional history could be obtained from her.  ED Course: Afebrile.  White count 13.3.  Testing for SARS-CoV-2 pending.  Chest x-ray showing low lung volumes with atelectasis and/or scarring in the left midlung and left lung base, no pneumothorax.  Head CT negative for acute abnormality.  CT C-spine negative for acute abnormality. X-ray of thoracic spine without acute osseous abnormality.  X-ray of lumbar spine: 1. Suspected transitional anatomy. First non rib-bearing lumbar vertebra is designated L1 for the purposes of reporting 2. Somewhat abrupt appearing left lateral displacement of L2 with respect to L3 on AP view of uncertain chronicity. There is posterior rod and fixating screws at L3-L4 with suspected lucency/loosening about the right pedicle screws, and possible partial dislodgement of the rods and fixating screws on the right side. No gross acute fracture is seen.  X-ray of right hip/pelvis showing acute comminuted right intertrochanteric fracture.  Skinfold artifact versus nondisplaced fracture over the right inferior pubic ramus.  ED provider discussed the case with Dr. Lucia Gaskins from Ortho, patient will be taken to the OR in the morning.   Review of Systems:  All systems reviewed and apart from history of presenting illness, are  negative.  Past Medical History:  Diagnosis Date   Arthritis     Past surgical history: Unable to obtain at this time.   reports that she has never smoked. She has never used smokeless tobacco. She reports previous alcohol use. She reports that she does not use drugs.  No Known Allergies  No family history on file.  Unable to obtain family history at this time.  Prior to Admission medications   Medication Sig Start Date End Date Taking? Authorizing Provider  acetaminophen (TYLENOL) 325 MG tablet Take 650 mg by mouth every 8 (eight) hours as needed for mild pain.   Yes [provider]  cholecalciferol (VITAMIN D3) 25 MCG (1000 UT) tablet Take 1,000 Units by mouth daily.   Yes [provider]  cloNIDine (CATAPRES - DOSED IN MG/24 HR) 0.1 mg/24hr patch Place 0.1 mg onto the skin once a week. On Mondays   Yes [provider]  divalproex (DEPAKOTE SPRINKLE) 125 MG capsule Take 125 mg by mouth 2 (two) times daily.   Yes [provider]  DULoxetine (CYMBALTA) 30 MG capsule Take 30 mg by mouth daily.   Yes [provider]  levothyroxine (SYNTHROID) 25 MCG tablet Take 25 mcg by mouth daily before breakfast.   Yes [provider]  Melatonin 5 MG TABS Take 5 mg by mouth at bedtime as needed (sleep).   Yes [provider]  traZODone (DESYREL) 50 MG tablet Take 25 mg by mouth at bedtime.   Yes [provider]  vitamin B-12 (CYANOCOBALAMIN) 1000 MCG tablet Take 1,000 mcg by mouth daily.   Yes [provider]  vitamin  C (ASCORBIC ACID) 500 MG tablet Take 500 mg by mouth daily.   Yes [provider]    Physical Exam: Vitals:   04/04/19 0400 04/04/19 0430 04/04/19 0500 04/04/19 0530  BP: (!) 162/81 (!) 157/70 (!) 159/79 (!) 170/148  Pulse: 73  71 79  Resp: 18  19 19   Temp:      TempSrc:      SpO2: 98%  96% 92%    Physical Exam  Constitutional: She appears well-developed and well-nourished. No distress.   HENT:  Head: Normocephalic.  Mouth/Throat: Oropharynx is clear and moist.  Eyes: Right eye exhibits no discharge. Left eye exhibits no discharge.  Neck: Neck supple.  Cardiovascular: Normal rate, regular rhythm and intact distal pulses.  Pulmonary/Chest: Effort normal. No respiratory distress. She has no wheezes. She has no rales.  Abdominal: Soft. Bowel sounds are normal. She exhibits no distension. There is no abdominal tenderness. There is no guarding.  Musculoskeletal:     Comments: +1 pedal edema bilaterally Right lower extremity shortened and externally rotated Dorsalis pedis pulse intact bilaterally  Neurological:  Awake and alert  Skin: Skin is warm and dry. She is not diaphoretic.     Labs on Admission: I have personally reviewed following labs and imaging studies  CBC: Recent Labs  Lab 04/04/19 0340  WBC 13.3*  HGB 13.2  HCT 42.5  MCV 93.8  PLT 99991111   Basic Metabolic Panel: Recent Labs  Lab 04/04/19 0051  NA 139  K 3.7  CL 105  CO2 22  GLUCOSE 132*  BUN 13  CREATININE 0.83  CALCIUM 10.0   GFR: CrCl cannot be calculated (Unknown ideal weight.). Liver Function Tests: No results for input(s): AST, ALT, ALKPHOS, BILITOT, PROT, ALBUMIN in the last 168 hours. No results for input(s): LIPASE, AMYLASE in the last 168 hours. No results for input(s): AMMONIA in the last 168 hours. Coagulation Profile: Recent Labs  Lab 04/04/19 0051  INR 0.9   Cardiac Enzymes: No results for input(s): CKTOTAL, CKMB, CKMBINDEX, TROPONINI in the last 168 hours. BNP (last 3 results) No results for input(s): PROBNP in the last 8760 hours. HbA1C: No results for input(s): HGBA1C in the last 72 hours. CBG: No results for input(s): GLUCAP in the last 168 hours. Lipid Profile: No results for input(s): CHOL, HDL, LDLCALC, TRIG, CHOLHDL, LDLDIRECT in the last 72 hours. Thyroid Function Tests: No results for input(s): TSH, T4TOTAL, FREET4, T3FREE, THYROIDAB in the last 72  hours. Anemia Panel: No results for input(s): VITAMINB12, FOLATE, FERRITIN, TIBC, IRON, RETICCTPCT in the last 72 hours. Urine analysis: No results found for: COLORURINE, APPEARANCEUR, LABSPEC, [place], GLUCOSEU, HGBUR, BILIRUBINUR, KETONESUR, PROTEINUR, UROBILINOGEN, NITRITE, LEUKOCYTESUR  Radiological Exams on Admission: Dg Chest 1 View  Result Date: 04/04/2019 CLINICAL DATA:  Fall EXAM: CHEST  1 VIEW COMPARISON:  None. FINDINGS: Low lung volumes. Linear atelectasis or scarring at the left mid lung and left lung base. Right lung is clear. Normal heart size. No pneumothorax. IMPRESSION: Low lung volumes with atelectasis and or scarring in the left mid lung and left lung base Electronically Signed   By: Donavan Foil M.D.   On: 04/04/2019 03:38   Dg Thoracic Spine 2 View  Result Date: 04/04/2019 CLINICAL DATA:  Fall EXAM: THORACIC SPINE 2 VIEWS COMPARISON:  None. FINDINGS: Limited by positioning. Thoracic alignment grossly within normal limits. Vertebral body heights appear maintained. Diffuse osteophytosis of the thoracic spine. IMPRESSION: No definite acute osseous abnormality. Electronically Signed   By: Donavan Foil  M.D.   On: 04/04/2019 03:45   Dg Lumbar Spine Complete  Result Date: 04/04/2019 CLINICAL DATA:  Fall EXAM: LUMBAR SPINE - COMPLETE 4+ VIEW COMPARISON:  None. FINDINGS: Transitional anatomy suspected with 4 non rib-bearing vertebra. First non rib-bearing lumbar vertebra will be designated L1. Left lateral positioning of L2 with respect to L3 on the frontal view. This is of uncertain chronicity. Suspected lucency about the right posterior rod and fixating screws at L3-L4. Vertebral body heights are grossly maintained. Prominent degenerative changes at L3-L4. Diffuse osteophytes of the lumbar spine. IMPRESSION: 1. Suspected transitional anatomy. First non rib-bearing lumbar vertebra is designated L1 for the purposes of reporting 2. Somewhat abrupt appearing left lateral displacement of  L2 with respect to L3 on AP view of uncertain chronicity. There is posterior rod and fixating screws at L3-L4 with suspected lucency/loosening about the right pedicle screws, and possible partial dislodgement of the rods and fixating screws on the right side. No gross acute fracture is seen. Electronically Signed   By: Donavan Foil M.D.   On: 04/04/2019 03:43   Ct Head Wo Contrast  Result Date: 04/04/2019 CLINICAL DATA:  85 year old female with fall EXAM: CT HEAD WITHOUT CONTRAST CT CERVICAL SPINE WITHOUT CONTRAST TECHNIQUE: Multidetector CT imaging of the head and cervical spine was performed following the standard protocol without intravenous contrast. Multiplanar CT image reconstructions of the cervical spine were also generated. COMPARISON:  None. FINDINGS: CT HEAD FINDINGS Brain: There is mild age-related atrophy and chronic microvascular ischemic changes. There is no acute intracranial. No mass effect or midline shift. No extra-axial fluid collection. Vascular: No hyperdense vessel or unexpected calcification. Skull: Normal. Negative for fracture or focal lesion. Sinuses/Orbits: No acute finding. Other: None CT CERVICAL SPINE FINDINGS Evaluation of this exam is limited due to motion artifact. Alignment: No acute subluxation. There is grade 1 C4-C5 anterolisthesis. Skull base and vertebrae: No definite acute fracture. C4 and C5 laminectomy changes. Soft tissues and spinal canal: No prevertebral fluid or swelling. No visible canal hematoma. Disc levels: Multilevel degenerative changes and disc disease with endplate irregularity and disc space narrowing. Multilevel facet arthropathy. Upper chest: Negative. Other: Bilateral carotid bulb calcified plaques. IMPRESSION: 1. No acute intracranial hemorrhage. 2. Mild age-related atrophy and chronic microvascular ischemic changes. 3. No acute/traumatic cervical spine pathology. Electronically Signed   By: Anner Crete M.D.   On: 04/04/2019 03:43   Ct Cervical  Spine Wo Contrast  Result Date: 04/04/2019 CLINICAL DATA:  85 year old female with fall EXAM: CT HEAD WITHOUT CONTRAST CT CERVICAL SPINE WITHOUT CONTRAST TECHNIQUE: Multidetector CT imaging of the head and cervical spine was performed following the standard protocol without intravenous contrast. Multiplanar CT image reconstructions of the cervical spine were also generated. COMPARISON:  None. FINDINGS: CT HEAD FINDINGS Brain: There is mild age-related atrophy and chronic microvascular ischemic changes. There is no acute intracranial. No mass effect or midline shift. No extra-axial fluid collection. Vascular: No hyperdense vessel or unexpected calcification. Skull: Normal. Negative for fracture or focal lesion. Sinuses/Orbits: No acute finding. Other: None CT CERVICAL SPINE FINDINGS Evaluation of this exam is limited due to motion artifact. Alignment: No acute subluxation. There is grade 1 C4-C5 anterolisthesis. Skull base and vertebrae: No definite acute fracture. C4 and C5 laminectomy changes. Soft tissues and spinal canal: No prevertebral fluid or swelling. No visible canal hematoma. Disc levels: Multilevel degenerative changes and disc disease with endplate irregularity and disc space narrowing. Multilevel facet arthropathy. Upper chest: Negative. Other: Bilateral carotid bulb calcified plaques. IMPRESSION:  1. No acute intracranial hemorrhage. 2. Mild age-related atrophy and chronic microvascular ischemic changes. 3. No acute/traumatic cervical spine pathology. Electronically Signed   By: Anner Crete M.D.   On: 04/04/2019 03:43   Dg Hip Unilat With Pelvis 2-3 Views Right  Result Date: 04/04/2019 CLINICAL DATA:  Fall with hip pain EXAM: DG HIP (WITH OR WITHOUT PELVIS) 2-3V RIGHT COMPARISON:  None. FINDINGS: Right SI joint is non widened. Pubic symphysis is intact. Acute comminuted right intertrochanteric fracture. Right femoral head projects in joint. Skin fold artifact versus nondisplaced fracture over  the right inferior pubic ramus. IMPRESSION: 1. Acute comminuted right intertrochanteric fracture. 2. Skin fold artifact versus nondisplaced fracture over the right inferior pubic ramus Electronically Signed   By: Donavan Foil M.D.   On: 04/04/2019 03:47    EKG: Pending at this time.  Assessment/Plan Principal Problem:   Hip fracture (HCC) Active Problems:   Leukocytosis   HTN (hypertension)   Depression   Hypothyroidism   Acute right hip fracture secondary to mechanical fall X-ray of right hip/pelvis showing acute comminuted right intertrochanteric fracture.  Skinfold artifact versus nondisplaced fracture over the right inferior pubic ramus. -ED provider discussed the case with Dr. Lucia Gaskins from Ortho, patient will be taken to the OR in the morning. -Keep n.p.o. IV fluid hydration. -Pain management: Morphine 25 mg every 2 hours as needed, Norco 5-325 mg 1 to 2 tablets every 6 hours as needed -Bedrest, NWB  Leukocytosis Possibly reactive. Afebrile.  White count 13.3.  Chest x-ray not suggestive of pneumonia. -Check UA -Continue to monitor CBC  Hypertension Blood pressure elevated with systolic in the [id number]. -Hydralazine PRN  Depression -Continue Cymbalta  Hypothyroidism -Continue Synthroid  DVT prophylaxis: SCDs Code Status: Full code.  Patient did not engage in discussion regarding CODE STATUS.  This needs to be readdressed in the morning.  Obtain paperwork from her nursing home. Family Communication: No family available at this time. Disposition Plan: Anticipate discharge after hip surgery. Consults called: Orthopedics Admission status: It is my clinical opinion that admission to INPATIENT is reasonable and necessary in this 85 y.o. female  presenting with acute right hip fracture secondary to mechanical fall.  Ortho planning on taking her to the OR in the morning.  Given the aforementioned, the predictability of an adverse outcome is felt to be significant. I expect  that the patient will require at least 2 midnights in the hospital to treat this condition.  Shela Leff MD [facility] Pager [phone]  If 7PM-7AM, please contact night-coverage [email or web address] Password [facility]  04/04/2019, 5:57 AM

## 2019-04-04 NOTE — Consult Note (Signed)
ORTHOPAEDIC CONSULTATION  REQUESTING PHYSICIAN: Donne Hazel, MD  PCP:  [name], MD  Chief Complaint: Right hip injury  HPI: Haley Cohen is a 85 y.o. female who complains of right hip pain after she had a fall.  Patient is confused and states that she fell while trying to get her Christmas decorations.  Review of the chart shows a couple different recollections of how the injury happened.  She does reside at friend's home.  She has right hip pain and inability to weight-bear.  She denies other injuries.  Past Medical History:  Diagnosis Date   Arthritis    History reviewed. No pertinent surgical history. Social History   Socioeconomic History   Marital status: Unknown    Spouse name: Not on file   Number of children: Not on file   Years of education: Not on file   Highest education level: Not on file  Occupational History   Not on file  Social Needs   Financial resource strain: Not on file   Food insecurity    Worry: Not on file    Inability: Not on file   Transportation needs    Medical: Not on file    Non-medical: Not on file  Tobacco Use   Smoking status: Never Smoker   Smokeless tobacco: Never Used  Substance and Sexual Activity   Alcohol use: Not Currently   Drug use: Never   Sexual activity: Not Currently  Lifestyle   Physical activity    Days per week: Not on file    Minutes per session: Not on file   Stress: Not on file  Relationships   Social connections    Talks on phone: Not on file    Gets together: Not on file    Attends religious service: Not on file    Active member of club or organization: Not on file    Attends meetings of clubs or organizations: Not on file    Relationship status: Not on file  Other Topics Concern   Not on file  Social History Narrative   Not on file   History reviewed. No pertinent family history. No Known Allergies Prior to Admission medications   Medication Sig Start Date End  Date Taking? Authorizing Provider  acetaminophen (TYLENOL) 325 MG tablet Take 650 mg by mouth every 8 (eight) hours as needed for mild pain.   Yes [provider]  cholecalciferol (VITAMIN D3) 25 MCG (1000 UT) tablet Take 1,000 Units by mouth daily.   Yes [provider]  cloNIDine (CATAPRES - DOSED IN MG/24 HR) 0.1 mg/24hr patch Place 0.1 mg onto the skin once a week. On Mondays   Yes [provider]  divalproex (DEPAKOTE SPRINKLE) 125 MG capsule Take 125 mg by mouth 2 (two) times daily.   Yes [provider]  DULoxetine (CYMBALTA) 30 MG capsule Take 30 mg by mouth daily.   Yes [provider]  levothyroxine (SYNTHROID) 25 MCG tablet Take 25 mcg by mouth daily before breakfast.   Yes [provider]  Melatonin 5 MG TABS Take 5 mg by mouth at bedtime as needed (sleep).   Yes [provider]  traZODone (DESYREL) 50 MG tablet Take 25 mg by mouth at bedtime.   Yes [provider]  vitamin B-12 (CYANOCOBALAMIN) 1000 MCG tablet Take 1,000 mcg by mouth daily.   Yes [provider]  vitamin C (ASCORBIC ACID) 500 MG tablet Take 500 mg by mouth daily.  Yes [provider]   Dg Chest 1 View  Result Date: 04/04/2019 CLINICAL DATA:  Fall EXAM: CHEST  1 VIEW COMPARISON:  None. FINDINGS: Low lung volumes. Linear atelectasis or scarring at the left mid lung and left lung base. Right lung is clear. Normal heart size. No pneumothorax. IMPRESSION: Low lung volumes with atelectasis and or scarring in the left mid lung and left lung base Electronically Signed   By: Donavan Foil M.D.   On: 04/04/2019 03:38   Dg Thoracic Spine 2 View  Result Date: 04/04/2019 CLINICAL DATA:  Fall EXAM: THORACIC SPINE 2 VIEWS COMPARISON:  None. FINDINGS: Limited by positioning. Thoracic alignment grossly within normal limits. Vertebral body heights appear maintained. Diffuse osteophytosis of the thoracic spine. IMPRESSION: No definite acute osseous  abnormality. Electronically Signed   By: Donavan Foil M.D.   On: 04/04/2019 03:45   Dg Lumbar Spine Complete  Result Date: 04/04/2019 CLINICAL DATA:  Fall EXAM: LUMBAR SPINE - COMPLETE 4+ VIEW COMPARISON:  None. FINDINGS: Transitional anatomy suspected with 4 non rib-bearing vertebra. First non rib-bearing lumbar vertebra will be designated L1. Left lateral positioning of L2 with respect to L3 on the frontal view. This is of uncertain chronicity. Suspected lucency about the right posterior rod and fixating screws at L3-L4. Vertebral body heights are grossly maintained. Prominent degenerative changes at L3-L4. Diffuse osteophytes of the lumbar spine. IMPRESSION: 1. Suspected transitional anatomy. First non rib-bearing lumbar vertebra is designated L1 for the purposes of reporting 2. Somewhat abrupt appearing left lateral displacement of L2 with respect to L3 on AP view of uncertain chronicity. There is posterior rod and fixating screws at L3-L4 with suspected lucency/loosening about the right pedicle screws, and possible partial dislodgement of the rods and fixating screws on the right side. No gross acute fracture is seen. Electronically Signed   By: Donavan Foil M.D.   On: 04/04/2019 03:43   Ct Head Wo Contrast  Result Date: 04/04/2019 CLINICAL DATA:  85 year old female with fall EXAM: CT HEAD WITHOUT CONTRAST CT CERVICAL SPINE WITHOUT CONTRAST TECHNIQUE: Multidetector CT imaging of the head and cervical spine was performed following the standard protocol without intravenous contrast. Multiplanar CT image reconstructions of the cervical spine were also generated. COMPARISON:  None. FINDINGS: CT HEAD FINDINGS Brain: There is mild age-related atrophy and chronic microvascular ischemic changes. There is no acute intracranial. No mass effect or midline shift. No extra-axial fluid collection. Vascular: No hyperdense vessel or unexpected calcification. Skull: Normal. Negative for fracture or focal lesion.  Sinuses/Orbits: No acute finding. Other: None CT CERVICAL SPINE FINDINGS Evaluation of this exam is limited due to motion artifact. Alignment: No acute subluxation. There is grade 1 C4-C5 anterolisthesis. Skull base and vertebrae: No definite acute fracture. C4 and C5 laminectomy changes. Soft tissues and spinal canal: No prevertebral fluid or swelling. No visible canal hematoma. Disc levels: Multilevel degenerative changes and disc disease with endplate irregularity and disc space narrowing. Multilevel facet arthropathy. Upper chest: Negative. Other: Bilateral carotid bulb calcified plaques. IMPRESSION: 1. No acute intracranial hemorrhage. 2. Mild age-related atrophy and chronic microvascular ischemic changes. 3. No acute/traumatic cervical spine pathology. Electronically Signed   By: Anner Crete M.D.   On: 04/04/2019 03:43   Ct Cervical Spine Wo Contrast  Result Date: 04/04/2019 CLINICAL DATA:  85 year old female with fall EXAM: CT HEAD WITHOUT CONTRAST CT CERVICAL SPINE WITHOUT CONTRAST TECHNIQUE: Multidetector CT imaging of the head and cervical spine was performed following the standard protocol without intravenous contrast. Multiplanar CT  image reconstructions of the cervical spine were also generated. COMPARISON:  None. FINDINGS: CT HEAD FINDINGS Brain: There is mild age-related atrophy and chronic microvascular ischemic changes. There is no acute intracranial. No mass effect or midline shift. No extra-axial fluid collection. Vascular: No hyperdense vessel or unexpected calcification. Skull: Normal. Negative for fracture or focal lesion. Sinuses/Orbits: No acute finding. Other: None CT CERVICAL SPINE FINDINGS Evaluation of this exam is limited due to motion artifact. Alignment: No acute subluxation. There is grade 1 C4-C5 anterolisthesis. Skull base and vertebrae: No definite acute fracture. C4 and C5 laminectomy changes. Soft tissues and spinal canal: No prevertebral fluid or swelling. No visible  canal hematoma. Disc levels: Multilevel degenerative changes and disc disease with endplate irregularity and disc space narrowing. Multilevel facet arthropathy. Upper chest: Negative. Other: Bilateral carotid bulb calcified plaques. IMPRESSION: 1. No acute intracranial hemorrhage. 2. Mild age-related atrophy and chronic microvascular ischemic changes. 3. No acute/traumatic cervical spine pathology. Electronically Signed   By: Anner Crete M.D.   On: 04/04/2019 03:43   Dg Knee Right Port  Result Date: 04/04/2019 CLINICAL DATA:  Right knee pain after fall. EXAM: PORTABLE RIGHT KNEE - 1-2 VIEW COMPARISON:  None. FINDINGS: Status post right total knee arthroplasty. The right femoral and tibial components appear to be well situated. No joint effusion is noted. No fracture or dislocation is noted. No soft tissue abnormality is noted. IMPRESSION: Status post right total knee arthroplasty. No acute abnormality seen in the right knee. Electronically Signed   By: Marijo Conception M.D.   On: 04/04/2019 08:50   Dg Hip Unilat With Pelvis 2-3 Views Right  Result Date: 04/04/2019 CLINICAL DATA:  Fall with hip pain EXAM: DG HIP (WITH OR WITHOUT PELVIS) 2-3V RIGHT COMPARISON:  None. FINDINGS: Right SI joint is non widened. Pubic symphysis is intact. Acute comminuted right intertrochanteric fracture. Right femoral head projects in joint. Skin fold artifact versus nondisplaced fracture over the right inferior pubic ramus. IMPRESSION: 1. Acute comminuted right intertrochanteric fracture. 2. Skin fold artifact versus nondisplaced fracture over the right inferior pubic ramus Electronically Signed   By: Donavan Foil M.D.   On: 04/04/2019 03:47    Positive ROS: All other systems have been reviewed and were otherwise negative with the exception of those mentioned in the HPI and as above.  Physical Exam: General: Alert, no acute distress Cardiovascular: No pedal edema Respiratory: No cyanosis, no use of accessory  musculature GI: No organomegaly, abdomen is soft and non-tender Skin: No lesions in the area of chief complaint Neurologic: Sensation intact distally Psychiatric: Pleasantly confused  lymphatic: No axillary or cervical lymphadenopathy  MUSCULOSKELETAL: Examination of the right lower extremity reveals no lesions over the hip.  She is shortened and externally rotated.  She has a healed anterior scar over the knee compatible with knee replacement.  She has pain with logrolling of the hip.  She has positive motor function dorsiflexion, plantarflexion, and great toe extension.  She has palpable pulses.  She reports intact sensation to light touch in all distributions.  Assessment: Displaced right intertrochanteric femur fracture. ?  Dementia.  Plan: I discussed the findings with the patient.  Her injury requires operative stabilization for pain control and to allow mobilization out of bed.  Plan for intramedullary fixation of the right femur today.  All questions were solicited and answered.    Bertram Savin, MD Cell [phone]    04/04/2019 1:04 PM

## 2019-04-05 ENCOUNTER — Encounter (HOSPITAL_COMMUNITY): Payer: Self-pay | Admitting: Orthopedic Surgery

## 2019-04-05 LAB — URINALYSIS, ROUTINE W REFLEX MICROSCOPIC
Bacteria, UA: NONE SEEN
Bilirubin Urine: NEGATIVE
Glucose, UA: NEGATIVE mg/dL
Ketones, ur: NEGATIVE mg/dL
Nitrite: NEGATIVE
Protein, ur: NEGATIVE mg/dL
RBC / HPF: >50 RBC/hpf — ABNORMAL HIGH (ref 0–5)
Specific Gravity, Urine: 1.021 (ref 1.005–1.030)
pH: 5 (ref 5.0–8.0)

## 2019-04-05 LAB — CBC
HCT: 34.7 % — ABNORMAL LOW (ref 36.0–46.0)
Hemoglobin: 10.9 g/dL — ABNORMAL LOW (ref 12.0–15.0)
MCH: 29.9 pg (ref 26.0–34.0)
MCHC: 31.4 g/dL (ref 30.0–36.0)
MCV: 95.1 fL (ref 80.0–100.0)
Platelets: 199 10*3/uL (ref 150–400)
RBC: 3.65 MIL/uL — ABNORMAL LOW (ref 3.87–5.11)
RDW: 14 % (ref 11.5–15.5)
WBC: 11.4 10*3/uL — ABNORMAL HIGH (ref 4.0–10.5)
nRBC: 0 % (ref 0.0–0.2)

## 2019-04-05 LAB — BASIC METABOLIC PANEL
Anion gap: 8 (ref 5–15)
BUN: 13 mg/dL (ref 8–23)
CO2: 23 mmol/L (ref 22–32)
Calcium: 9.1 mg/dL (ref 8.9–10.3)
Chloride: 107 mmol/L (ref 98–111)
Creatinine, Ser: 0.83 mg/dL (ref 0.44–1.00)
GFR calc Af Amer: >60 mL/min (ref >60)
GFR calc non Af Amer: >60 mL/min (ref >60)
Glucose, Bld: 148 mg/dL — ABNORMAL HIGH (ref 70–99)
Potassium: 4.5 mmol/L (ref 3.5–5.1)
Sodium: 138 mmol/L (ref 135–145)

## 2019-04-05 MED ORDER — ACETAMINOPHEN 325 MG PO TABS
650.0000 mg | ORAL_TABLET | Freq: Four times a day (QID) | ORAL | Status: DC | PRN
  Administered 2019-04-05 – 2019-04-07 (×5): 650 mg via ORAL
  Filled 2019-04-05 (×5): qty 2

## 2019-04-05 MED ORDER — HYDROCODONE-ACETAMINOPHEN 5-325 MG PO TABS: 1.0000 | ORAL_TABLET | ORAL | 0 refills | Status: DC | PRN

## 2019-04-05 MED ORDER — ENOXAPARIN SODIUM 40 MG/0.4ML ~~LOC~~ SOLN: 40.0000 mg | SUBCUTANEOUS | 0 refills | Status: DC

## 2019-04-05 MED ORDER — DOCUSATE SODIUM 100 MG PO CAPS: 100.0000 mg | ORAL_CAPSULE | Freq: Two times a day (BID) | ORAL | 0 refills | Status: DC

## 2019-04-05 NOTE — Progress Notes (Signed)
Physical Therapy Treatment Patient Details Name: Haley Cohen MRN: [id number] DOB: 06-Dec-1931 Today's Date: 04/05/2019    History of Present Illness 85 y.o. female with medical history significant of hypertension, arthritis presenting to the hospital via EMS from her nursing home for evaluation after a fall. Dx R intertrochanteric femur fx, s/p IM nail    PT Comments    Pt premedicated for pain. Sit to stand with +2 total assist, stand pivot transfer from 3 in 1 to recliner with +2 total assist. Pt unable to advance RLE to take a step 2* pain and difficulty following commands 2* dementia. She tolerated gentle AAROM to RLE.    Follow Up Recommendations  SNF;Supervision/Assistance - 24 hour     Equipment Recommendations  None recommended by PT(defer to SNF)    Recommendations for Other Services       Precautions / Restrictions Precautions Precautions: Fall Precaution Comments: son reports pt has had ~5 falls in past 1 year Restrictions Weight Bearing Restrictions: No Other Position/Activity Restrictions: WBAT    Mobility  Bed Mobility               General bed mobility comments: up in recliner  Transfers Overall transfer level: Needs assistance Equipment used: Rolling walker (2 wheeled) Transfers: Sit to/from [employer] Sit to Stand: +2 physical assistance;Total assist Stand pivot transfers: Total assist;+2 physical assistance       General transfer comment: sit to stand from 3 in 1, then pivot to recliner, VCs hand placement, RLE buckling, pt 25%, flexed posture in standing, pain limiting activity tolerance  Ambulation/Gait                 Stairs             Wheelchair Mobility    Modified Rankin (Stroke Patients Only)       Balance Overall balance assessment: Needs assistance Sitting-balance support: Feet supported;Bilateral upper extremity supported Sitting balance-Leahy Scale: Fair                                      Cognition Arousal/Alertness: Awake/alert Behavior During Therapy: WFL for tasks assessed/performed Overall Cognitive Status: History of cognitive impairments - at baseline                                 General Comments: oriented to self only, not aware of her location nor her situation      Exercises General Exercises - Lower Extremity Ankle Circles/Pumps: AROM;Both;10 reps;Supine Heel Slides: AAROM;Right;10 reps;Supine Hip ABduction/ADduction: AAROM;Right;10 reps;Supine    General Comments        Pertinent Vitals/Pain Pain Assessment: Faces Faces Pain Scale: Hurts even more Pain Location: R hip Pain Descriptors / Indicators: Guarding;Grimacing Pain Intervention(s): Limited activity within patient's tolerance;Monitored during session;Premedicated before session;Ice applied    Home Living Family/patient expects to be discharged to:: Skilled nursing facility               Additional Comments: pt is from dementia unit at [facility]    Prior Function Level of Independence: Needs assistance  Gait / Transfers Assistance Needed: walks with RW, h/o falls ADL's / Homemaking Assistance Needed: assit for bathing     PT Goals (current goals can now be found in the care plan section) Acute Rehab PT Goals Patient Stated Goal: DC to SNF at  [facility] PT Goal Formulation: With family Time For Goal Achievement: 04/19/19 Potential to Achieve Goals: Fair Progress towards PT goals: Progressing toward goals    Frequency    7X/week      PT Plan Current plan remains appropriate    Co-evaluation              AM-PAC PT "6 Clicks" Mobility   Outcome Measure  Help needed turning from your back to your side while in a flat bed without using bedrails?: Total Help needed moving from lying on your back to sitting on the side of a flat bed without using bedrails?: Total Help needed moving to and from a bed to a chair (including a  wheelchair)?: Total Help needed standing up from a chair using your arms (e.g., wheelchair or bedside chair)?: Total Help needed to walk in hospital room?: Total Help needed climbing 3-5 steps with a railing? : Total 6 Click Score: 6    End of Session Equipment Utilized During Treatment: Gait belt Activity Tolerance: Patient limited by pain Patient left: in chair;with call bell/phone within reach;with chair alarm set Nurse Communication: Mobility status PT Visit Diagnosis: Pain;Muscle weakness (generalized) (M62.81);Difficulty in walking, not elsewhere classified (R26.2) Pain - Right/Left: Right Pain - part of body: Hip;Knee     Time: [id number] PT Time Calculation (min) (ACUTE ONLY): 18 min  Charges:  $Therapeutic Activity: 8-22 mins                     Blondell Reveal Kistler PT 04/05/2019  Acute Rehabilitation Services Pager [phone] Office [phone]

## 2019-04-05 NOTE — Progress Notes (Signed)
    Subjective:  Patient reports pain as mild to moderate.  Denies N/V/CP/SOB. No c/o.  Objective:   VITALS:   Vitals:   04/04/19 2053 04/05/19 0233 04/05/19 0422 04/05/19 1034  BP: 115/62 138/90 136/82   Pulse: 81 78 81   Resp: 16 16 16    Temp: 98.7 F (37.1 C) 98.5 F (36.9 C)    TempSrc: Axillary Oral    SpO2: 93% 92% 91%   Height:    5\' 6"  (1.676 m)    NAD Sensation intact distally Intact pulses distally Dorsiflexion/Plantar flexion intact Incision: dressing C/D/I Compartment soft   Lab Results  Component Value Date   WBC 11.4 (H) 04/05/2019   HGB 10.9 (L) 04/05/2019   HCT 34.7 (L) 04/05/2019   MCV 95.1 04/05/2019   PLT 199 04/05/2019   BMET    Component Value Date/Time   NA 138 04/05/2019 0213   K 4.5 04/05/2019 0213   CL 107 04/05/2019 0213   CO2 23 04/05/2019 0213   GLUCOSE 148 (H) 04/05/2019 0213   BUN 13 04/05/2019 0213   CREATININE 0.83 04/05/2019 0213   CALCIUM 9.1 04/05/2019 0213   GFRNONAA >60 04/05/2019 0213   GFRAA >60 04/05/2019 0213     Assessment/Plan: 1 Day Post-Op   Principal Problem:   Hip fracture (HCC) Active Problems:   Leukocytosis   HTN (hypertension)   Depression   Hypothyroidism   WBAT with walker DVT ppx: Lovenox, SCDs, TEDS PO pain control PT/OT Dispo: SNF placement   Hilton Cork Andrzej Scully 04/05/2019, 12:27 PM   Rod Can, MD Cell: [phone] [place] [facility]  [place] [address], [place], [place] [address] Phone: [phone] [email or web address] Facebook  [employer]

## 2019-04-05 NOTE — Progress Notes (Signed)
Patient non-verbal and combative first half of shift, then pleasantly confused and cooperative. Morphine IV PRN held this shift. Sats in the low 80's on room air, low 90's on 2L nasal cannula.

## 2019-04-05 NOTE — Progress Notes (Signed)
Paged floor coverage to request Tylenol as patient's confusion seems to increase after narcotic  administration.

## 2019-04-05 NOTE — Evaluation (Addendum)
Physical Therapy Evaluation Patient Details Name: Haley Cohen MRN: [id number] DOB: 08/26/1931 Today's Date: 04/05/2019   History of Present Illness  85 y.o. female with medical history significant of hypertension, arthritis presenting to the hospital via EMS from her nursing home for evaluation after a fall. Dx R intertrochanteric femur fx, s/p IM nail  Clinical Impression  Pt admitted with above diagnosis. Attempted sit to stand with +2 total assist, pt unable to tolerate minimal movement and did not clear buttocks from recliner. She did not tolerate attempted ROM to R hip/knee 2* pain. Pt is oriented to herself only, which is baseline per her son. After being oriented to her location and situation, she had no recall of this a few minutes later. Pt is admitted from dementia unit at [facility], plan is to DC to SNF there.  Pt currently with functional limitations due to the deficits listed below (see PT Problem List). Pt will benefit from skilled PT to increase their independence and safety with mobility to allow discharge to the venue listed below.       Follow Up Recommendations SNF;Supervision/Assistance - 24 hour    Equipment Recommendations  None recommended by PT(defer to SNF)    Recommendations for Other Services       Precautions / Restrictions Precautions Precautions: Fall Precaution Comments: son reports pt has had ~5 falls in past 1 year Restrictions Weight Bearing Restrictions: No Other Position/Activity Restrictions: WBAT      Mobility  Bed Mobility               General bed mobility comments: up in recliner  Transfers Overall transfer level: Needs assistance Equipment used: Rolling walker (2 wheeled) Transfers: Sit to/from Stand Sit to Stand: +2 physical assistance;Max assist         General transfer comment: attempted sit to stand, pt unable to clear buttocks from seat 2* pain in RLE  Ambulation/Gait                Stairs             Wheelchair Mobility    Modified Rankin (Stroke Patients Only)       Balance Overall balance assessment: Needs assistance Sitting-balance support: Feet supported;Bilateral upper extremity supported Sitting balance-Leahy Scale: Fair                                       Pertinent Vitals/Pain Pain Assessment: Faces Faces Pain Scale: Hurts whole lot Pain Location: R thigh and knee Pain Descriptors / Indicators: Guarding;Grimacing Pain Intervention(s): Limited activity within patient's tolerance;Monitored during session;Ice applied;Other (comment)(RN notified)    Home Living Family/patient expects to be discharged to:: Skilled nursing facility                 Additional Comments: pt is from dementia unit at [facility]    Prior Function Level of Independence: Needs assistance   Gait / Transfers Assistance Needed: walks with RW, h/o falls  ADL's / Homemaking Assistance Needed: assit for bathing        Hand Dominance        Extremity/Trunk Assessment        Lower Extremity Assessment Lower Extremity Assessment: RLE deficits/detail RLE Deficits / Details: able to DF/PF ankles actively, unable to tolerate ROM to R knee/hip 2* pain RLE: Unable to fully assess due to pain RLE Sensation: WNL    Cervical / Trunk  Assessment Cervical / Trunk Assessment: Normal  Communication   Communication: No difficulties  Cognition Arousal/Alertness: Awake/alert Behavior During Therapy: WFL for tasks assessed/performed Overall Cognitive Status: History of cognitive impairments - at baseline                                 General Comments: oriented to self only, not aware of her location nor her situation      General Comments      Exercises General Exercises - Lower Extremity Ankle Circles/Pumps: AROM;Both;10 reps Heel Slides: (attempted, pt unable to tolerate 2* pain)   Assessment/Plan    PT Assessment Patient needs continued PT  services  PT Problem List Decreased strength;Decreased range of motion;Decreased activity tolerance;Decreased mobility;Pain       PT Treatment Interventions Gait training;DME instruction;Functional mobility training;Therapeutic exercise;Therapeutic activities;Patient/family education    PT Goals (Current goals can be found in the Care Plan section)  Acute Rehab PT Goals Patient Stated Goal: DC to SNF at [facility] PT Goal Formulation: With family Time For Goal Achievement: 04/19/19 Potential to Achieve Goals: Fair    Frequency 7X/week   Barriers to discharge        Co-evaluation               AM-PAC PT "6 Clicks" Mobility  Outcome Measure Help needed turning from your back to your side while in a flat bed without using bedrails?: Total Help needed moving from lying on your back to sitting on the side of a flat bed without using bedrails?: Total Help needed moving to and from a bed to a chair (including a wheelchair)?: Total Help needed standing up from a chair using your arms (e.g., wheelchair or bedside chair)?: Total Help needed to walk in hospital room?: Total Help needed climbing 3-5 steps with a railing? : Total 6 Click Score: 6    End of Session Equipment Utilized During Treatment: Gait belt Activity Tolerance: Patient limited by pain Patient left: in chair;with call bell/phone within reach;with chair alarm set Nurse Communication: Mobility status PT Visit Diagnosis: Pain;Muscle weakness (generalized) (M62.81);Difficulty in walking, not elsewhere classified (R26.2) Pain - Right/Left: Right Pain - part of body: Hip;Knee    Time: [id number] PT Time Calculation (min) (ACUTE ONLY): 18 min   Charges:   PT Evaluation $PT Eval Low Complexity: 1 Low        Blondell Reveal Kistler PT 04/05/2019  Acute Rehabilitation Services Pager [phone] Office [phone]

## 2019-04-05 NOTE — Progress Notes (Signed)
PROGRESS NOTE    Haley Cohen  [id number] DOB: 04-21-1932 DOA: 04/03/2019 PCP: [name], MD    Brief Narrative:  85 y.o. female with medical history significant of hypertension, arthritis presenting to the hospital via EMS from her nursing home for evaluation after a fall.  Patient states she was getting ready for bed, using a walker to ambulate and somehow fell.  Since the fall she has been having pain in her back and right hip.  She does not recall having chest pain, lightheadedness, or shortness of breath prior to the fall.  Denies loss of consciousness.  Assessment & Plan:   Principal Problem:   Hip fracture ([place]) Active Problems:   Leukocytosis   HTN (hypertension)   Depression   Hypothyroidism  Acute right hip fracture secondary to mechanical fall -X-ray of right hip/pelvis showing acute comminuted right intertrochanteric fracture. -Orthopedic Surgery consulted and pt is now s/p surgery 8/27 -PT recs for SNF. Discussed with care manager, pt is planned for tx to SNF on 8/30 secondary to needing 3 night stay for medicare reasons  Leukocytosis -Likely reactive. Afebrile. -improved  Hypertension -Blood pressure currently stable -Continue with hydralazine PRN  Depression -Continue Cymbalta as pt tolerates  Hypothyroidism -Continue Synthroid as tolerated  Dementia -Stable, pt unable to provide any meaningful detailed history at this time -Remains pleasantly confused  DVT prophylaxis: Lovenox, scd's Code Status: Full Family Communication: Pt in room, family not at bedside Disposition Plan: SNF likely 8/30  Consultants:   Orthopedic Surgery  Procedures:  Intramedullary fixation, Right femur 8/27  Antimicrobials: Anti-infectives (From admission, onward)   Start     Dose/Rate Route Frequency Ordered Stop   04/04/19 2000  ceFAZolin (ANCEF) IVPB 2g/100 mL premix     2 g 200 mL/hr over 30 Minutes Intravenous Every 6 hours 04/04/19 1757 04/05/19  0301   04/04/19 0815  ceFAZolin (ANCEF) IVPB 2g/100 mL premix     2 g 200 mL/hr over 30 Minutes Intravenous On call to O.R. 04/04/19 0806 04/04/19 1458      Subjective: Pleasantly confused. Without complaints  Objective: Vitals:   04/05/19 0233 04/05/19 0422 04/05/19 1034 04/05/19 1330  BP: 138/90 136/82  (!) 142/60  Pulse: 78 81  90  Resp: 16 16  16   Temp: 98.5 F (36.9 C)   98.3 F (36.8 C)  TempSrc: Oral     SpO2: 92% 91%  91%  Height:   5\' 6"  (1.676 m)     Intake/Output Summary (Last 24 hours) at 04/05/2019 1340 Last data filed at 04/05/2019 1023 Gross per 24 hour  Intake 1270 ml  Output 1455 ml  Net -185 ml   There were no vitals filed for this visit.  Examination: General exam: Awake, laying in bed, in nad Respiratory system: Normal respiratory effort, no wheezing Cardiovascular system: regular rate, s1, s2 Gastrointestinal system: Soft, nondistended, positive BS Central nervous system: CN2-12 grossly intact, strength intact Extremities: Perfused, no clubbing Skin: Normal skin turgor, no notable skin lesions seen Psychiatry: Mood normal // no visual hallucinations   Data Reviewed: I have personally reviewed following labs and imaging studies  CBC: Recent Labs  Lab 04/04/19 0340 04/05/19 0213  WBC 13.3* 11.4*  HGB 13.2 10.9*  HCT 42.5 34.7*  MCV 93.8 95.1  PLT 206 [id number]   Basic Metabolic Panel: Recent Labs  Lab 04/04/19 0051 04/05/19 0213  NA 139 138  K 3.7 4.5  CL 105 107  CO2 22 23  GLUCOSE 132* 148*  BUN 13 13  CREATININE 0.83 0.83  CALCIUM 10.0 9.1   GFR: CrCl cannot be calculated (Unknown ideal weight.). Liver Function Tests: No results for input(s): AST, ALT, ALKPHOS, BILITOT, PROT, ALBUMIN in the last 168 hours. No results for input(s): LIPASE, AMYLASE in the last 168 hours. No results for input(s): AMMONIA in the last 168 hours. Coagulation Profile: Recent Labs  Lab 04/04/19 0051  INR 0.9   Cardiac Enzymes: No results for  input(s): CKTOTAL, CKMB, CKMBINDEX, TROPONINI in the last 168 hours. BNP (last 3 results) No results for input(s): PROBNP in the last 8760 hours. HbA1C: No results for input(s): HGBA1C in the last 72 hours. CBG: No results for input(s): GLUCAP in the last 168 hours. Lipid Profile: No results for input(s): CHOL, HDL, LDLCALC, TRIG, CHOLHDL, LDLDIRECT in the last 72 hours. Thyroid Function Tests: No results for input(s): TSH, T4TOTAL, FREET4, T3FREE, THYROIDAB in the last 72 hours. Anemia Panel: No results for input(s): VITAMINB12, FOLATE, FERRITIN, TIBC, IRON, RETICCTPCT in the last 72 hours. Sepsis Labs: No results for input(s): PROCALCITON, LATICACIDVEN in the last 168 hours.  Recent Results (from the past 240 hour(s))  SARS Coronavirus 2 [facility] order, Performed in [facility] hospital lab) Nasopharyngeal Nasopharyngeal Swab     Status: None   Collection Time: 04/04/19  4:58 AM   Specimen: Nasopharyngeal Swab  Result Value Ref Range Status   SARS Coronavirus 2 NEGATIVE NEGATIVE Final    Comment: (NOTE) If result is NEGATIVE SARS-CoV-2 target nucleic acids are NOT DETECTED. The SARS-CoV-2 RNA is generally detectable in upper and lower  respiratory specimens during the acute phase of infection. The lowest  concentration of SARS-CoV-2 viral copies this assay can detect is 250  copies / mL. A negative result does not preclude SARS-CoV-2 infection  and should not be used as the sole basis for treatment or other  patient management decisions.  A negative result may occur with  improper specimen collection / handling, submission of specimen other  than nasopharyngeal swab, presence of viral mutation(s) within the  areas targeted by this assay, and inadequate number of viral copies  (<250 copies / mL). A negative result must be combined with clinical  observations, patient history, and epidemiological information. If result is POSITIVE SARS-CoV-2 target nucleic acids are  DETECTED. The SARS-CoV-2 RNA is generally detectable in upper and lower  respiratory specimens dur ing the acute phase of infection.  Positive  results are indicative of active infection with SARS-CoV-2.  Clinical  correlation with patient history and other diagnostic information is  necessary to determine patient infection status.  Positive results do  not rule out bacterial infection or co-infection with other viruses. If result is PRESUMPTIVE POSTIVE SARS-CoV-2 nucleic acids MAY BE PRESENT.   A presumptive positive result was obtained on the submitted specimen  and confirmed on repeat testing.  While 2019 novel coronavirus  (SARS-CoV-2) nucleic acids may be present in the submitted sample  additional confirmatory testing may be necessary for epidemiological  and / or clinical management purposes  to differentiate between  SARS-CoV-2 and other Sarbecovirus currently known to infect humans.  If clinically indicated additional testing with an alternate test  methodology [phone]) is advised. The SARS-CoV-2 RNA is generally  detectable in upper and lower respiratory sp ecimens during the acute  phase of infection. The expected result is Negative. Fact Sheet for Patients:  StrictlyIdeas.no Fact Sheet for Healthcare Providers: BankingDealers.co.za This test is not yet approved or cleared by the [place] FDA  and has been authorized for detection and/or diagnosis of SARS-CoV-2 by FDA under an Emergency Use Authorization (EUA).  This EUA will remain in effect (meaning this test can be used) for the duration of the COVID-19 declaration under Section 564(b)(1) of the Act, 21 U.S.C. section 360bbb-3(b)(1), unless the authorization is terminated or revoked sooner. Performed at [facility], [place] [address] [place], [place] [address]   Surgical pcr screen     Status: None   Collection Time: 04/04/19  1:04 PM   Specimen:  Nasal Mucosa; Nasal Swab  Result Value Ref Range Status   MRSA, PCR NEGATIVE NEGATIVE Final   Staphylococcus aureus NEGATIVE NEGATIVE Final    Comment: (NOTE) The Xpert SA Assay (FDA approved for NASAL specimens in patients 57 years of age and older), is one component of a comprehensive surveillance program. It is not intended to diagnose infection nor to guide or monitor treatment. Performed at [facility], [place] [address], [place], [place] [address]      Radiology Studies: Dg Chest 1 View  Result Date: 04/04/2019 CLINICAL DATA:  Fall EXAM: CHEST  1 VIEW COMPARISON:  None. FINDINGS: Low lung volumes. Linear atelectasis or scarring at the left mid lung and left lung base. Right lung is clear. Normal heart size. No pneumothorax. IMPRESSION: Low lung volumes with atelectasis and or scarring in the left mid lung and left lung base Electronically Signed   By: Donavan Foil M.D.   On: 04/04/2019 03:38   Dg Thoracic Spine 2 View  Result Date: 04/04/2019 CLINICAL DATA:  Fall EXAM: THORACIC SPINE 2 VIEWS COMPARISON:  None. FINDINGS: Limited by positioning. Thoracic alignment grossly within normal limits. Vertebral body heights appear maintained. Diffuse osteophytosis of the thoracic spine. IMPRESSION: No definite acute osseous abnormality. Electronically Signed   By: Donavan Foil M.D.   On: 04/04/2019 03:45   Dg Lumbar Spine Complete  Result Date: 04/04/2019 CLINICAL DATA:  Fall EXAM: LUMBAR SPINE - COMPLETE 4+ VIEW COMPARISON:  None. FINDINGS: Transitional anatomy suspected with 4 non rib-bearing vertebra. First non rib-bearing lumbar vertebra will be designated L1. Left lateral positioning of L2 with respect to L3 on the frontal view. This is of uncertain chronicity. Suspected lucency about the right posterior rod and fixating screws at L3-L4. Vertebral body heights are grossly maintained. Prominent degenerative changes at L3-L4. Diffuse osteophytes of the lumbar spine.  IMPRESSION: 1. Suspected transitional anatomy. First non rib-bearing lumbar vertebra is designated L1 for the purposes of reporting 2. Somewhat abrupt appearing left lateral displacement of L2 with respect to L3 on AP view of uncertain chronicity. There is posterior rod and fixating screws at L3-L4 with suspected lucency/loosening about the right pedicle screws, and possible partial dislodgement of the rods and fixating screws on the right side. No gross acute fracture is seen. Electronically Signed   By: Donavan Foil M.D.   On: 04/04/2019 03:43   Ct Head Wo Contrast  Result Date: 04/04/2019 CLINICAL DATA:  85 year old female with fall EXAM: CT HEAD WITHOUT CONTRAST CT CERVICAL SPINE WITHOUT CONTRAST TECHNIQUE: Multidetector CT imaging of the head and cervical spine was performed following the standard protocol without intravenous contrast. Multiplanar CT image reconstructions of the cervical spine were also generated. COMPARISON:  None. FINDINGS: CT HEAD FINDINGS Brain: There is mild age-related atrophy and chronic microvascular ischemic changes. There is no acute intracranial. No mass effect or midline shift. No extra-axial fluid collection. Vascular: No hyperdense vessel or unexpected calcification. Skull: Normal. Negative for  fracture or focal lesion. Sinuses/Orbits: No acute finding. Other: None CT CERVICAL SPINE FINDINGS Evaluation of this exam is limited due to motion artifact. Alignment: No acute subluxation. There is grade 1 C4-C5 anterolisthesis. Skull base and vertebrae: No definite acute fracture. C4 and C5 laminectomy changes. Soft tissues and spinal canal: No prevertebral fluid or swelling. No visible canal hematoma. Disc levels: Multilevel degenerative changes and disc disease with endplate irregularity and disc space narrowing. Multilevel facet arthropathy. Upper chest: Negative. Other: Bilateral carotid bulb calcified plaques. IMPRESSION: 1. No acute intracranial hemorrhage. 2. Mild age-related  atrophy and chronic microvascular ischemic changes. 3. No acute/traumatic cervical spine pathology. Electronically Signed   By: Anner Crete M.D.   On: 04/04/2019 03:43   Ct Cervical Spine Wo Contrast  Result Date: 04/04/2019 CLINICAL DATA:  85 year old female with fall EXAM: CT HEAD WITHOUT CONTRAST CT CERVICAL SPINE WITHOUT CONTRAST TECHNIQUE: Multidetector CT imaging of the head and cervical spine was performed following the standard protocol without intravenous contrast. Multiplanar CT image reconstructions of the cervical spine were also generated. COMPARISON:  None. FINDINGS: CT HEAD FINDINGS Brain: There is mild age-related atrophy and chronic microvascular ischemic changes. There is no acute intracranial. No mass effect or midline shift. No extra-axial fluid collection. Vascular: No hyperdense vessel or unexpected calcification. Skull: Normal. Negative for fracture or focal lesion. Sinuses/Orbits: No acute finding. Other: None CT CERVICAL SPINE FINDINGS Evaluation of this exam is limited due to motion artifact. Alignment: No acute subluxation. There is grade 1 C4-C5 anterolisthesis. Skull base and vertebrae: No definite acute fracture. C4 and C5 laminectomy changes. Soft tissues and spinal canal: No prevertebral fluid or swelling. No visible canal hematoma. Disc levels: Multilevel degenerative changes and disc disease with endplate irregularity and disc space narrowing. Multilevel facet arthropathy. Upper chest: Negative. Other: Bilateral carotid bulb calcified plaques. IMPRESSION: 1. No acute intracranial hemorrhage. 2. Mild age-related atrophy and chronic microvascular ischemic changes. 3. No acute/traumatic cervical spine pathology. Electronically Signed   By: Anner Crete M.D.   On: 04/04/2019 03:43   Pelvis Portable  Result Date: 04/04/2019 CLINICAL DATA:  Post right hip ORIF EXAM: PORTABLE PELVIS 1-2 VIEWS COMPARISON:  Same day radiographs and intraoperative fluoroscopy FINDINGS:  Radiograph demonstrates the placement of a right femoral intramedullary rod with transcervical cancellous fixation screw. Alignment across the fracture line is near anatomic. There is increased retraction of the fracture fragment involving the lesser trochanter of the right proximal femur. Overlying soft tissue swelling is noted. The left femoral head remains normally located with mild left hip arthrosis. Remaining bones are unremarkable. Foley catheter in place. IMPRESSION: Right femoral ORIF without evidence of hardware complication. Increased retraction of fracture fragment involving the right lesser trochanter Electronically Signed   By: Lovena Le M.D.   On: 04/04/2019 17:54   Dg Knee Right Port  Result Date: 04/04/2019 CLINICAL DATA:  Right knee pain after fall. EXAM: PORTABLE RIGHT KNEE - 1-2 VIEW COMPARISON:  None. FINDINGS: Status post right total knee arthroplasty. The right femoral and tibial components appear to be well situated. No joint effusion is noted. No fracture or dislocation is noted. No soft tissue abnormality is noted. IMPRESSION: Status post right total knee arthroplasty. No acute abnormality seen in the right knee. Electronically Signed   By: Marijo Conception M.D.   On: 04/04/2019 08:50   Dg C-arm 1-60 Min-no Report  Result Date: 04/04/2019 Fluoroscopy was utilized by the requesting physician.  No radiographic interpretation.   Dg Hip Operative Unilat W  Or W/o Pelvis Right  Result Date: 04/04/2019 CLINICAL DATA:  Intramedullary nail placement for right femoral fracture EXAM: OPERATIVE RIGHT HIP (WITH PELVIS IF PERFORMED) 4 VIEWS TECHNIQUE: Fluoroscopic spot image(s) were submitted for interpretation post-operatively. COMPARISON:  Radiographs April 04, 2019 FINDINGS: Initial frontal and frogleg lateral views demonstrate and intertrochanteric right femur fracture. Subsequent images demonstrate the placement a short stem right femoral intramedullary nail with a transcervical  cancellous screw and a distal threaded secure screw within the proximal femoral diaphysis. Post fixation alignment is near anatomic. Expected postsurgical soft tissue changes are noted. IMPRESSION: Right femoral intramedullary nail placement with near anatomic alignment. Electronically Signed   By: Lovena Le M.D.   On: 04/04/2019 17:52   Dg Hip Unilat With Pelvis 2-3 Views Right  Result Date: 04/04/2019 CLINICAL DATA:  Fall with hip pain EXAM: DG HIP (WITH OR WITHOUT PELVIS) 2-3V RIGHT COMPARISON:  None. FINDINGS: Right SI joint is non widened. Pubic symphysis is intact. Acute comminuted right intertrochanteric fracture. Right femoral head projects in joint. Skin fold artifact versus nondisplaced fracture over the right inferior pubic ramus. IMPRESSION: 1. Acute comminuted right intertrochanteric fracture. 2. Skin fold artifact versus nondisplaced fracture over the right inferior pubic ramus Electronically Signed   By: Donavan Foil M.D.   On: 04/04/2019 03:47    Scheduled Meds:  divalproex  125 mg Oral BID   docusate sodium  100 mg Oral BID   DULoxetine  30 mg Oral Daily   enoxaparin (LOVENOX) injection  40 mg Subcutaneous Q24H   levothyroxine  25 mcg Oral Q0600   senna  1 tablet Oral BID   traZODone  25 mg Oral QHS   vitamin B-12  1,000 mcg Oral Daily   vitamin C  500 mg Oral Daily   Continuous Infusions:    LOS: 1 day   Marylu Lund, MD [facility] Pager On Amion  If 7PM-7AM, please contact night-coverage 04/05/2019, 1:40 PM

## 2019-04-06 LAB — CBC
HCT: 30.3 % — ABNORMAL LOW (ref 36.0–46.0)
Hemoglobin: 9.4 g/dL — ABNORMAL LOW (ref 12.0–15.0)
MCH: 29.4 pg (ref 26.0–34.0)
MCHC: 31 g/dL (ref 30.0–36.0)
MCV: 94.7 fL (ref 80.0–100.0)
Platelets: 170 10*3/uL (ref 150–400)
RBC: 3.2 MIL/uL — ABNORMAL LOW (ref 3.87–5.11)
RDW: 14.3 % (ref 11.5–15.5)
WBC: 9 10*3/uL (ref 4.0–10.5)
nRBC: 0 % (ref 0.0–0.2)

## 2019-04-06 LAB — BASIC METABOLIC PANEL
Anion gap: 6 (ref 5–15)
BUN: 18 mg/dL (ref 8–23)
CO2: 26 mmol/L (ref 22–32)
Calcium: 9.2 mg/dL (ref 8.9–10.3)
Chloride: 105 mmol/L (ref 98–111)
Creatinine, Ser: 0.78 mg/dL (ref 0.44–1.00)
GFR calc Af Amer: >60 mL/min (ref >60)
GFR calc non Af Amer: >60 mL/min (ref >60)
Glucose, Bld: 119 mg/dL — ABNORMAL HIGH (ref 70–99)
Potassium: 4 mmol/L (ref 3.5–5.1)
Sodium: 137 mmol/L (ref 135–145)

## 2019-04-06 LAB — GLUCOSE, CAPILLARY: Glucose-Capillary: 91 mg/dL (ref 70–99)

## 2019-04-06 NOTE — Progress Notes (Signed)
Patient transferred to Room 1334 to be closer to nurse's station. All belongings with patient. Family notified.

## 2019-04-06 NOTE — Progress Notes (Signed)
During bedside report patient was talkative and pleasant, opened eyes and engaged staff.  During rounding at [place], patient will not open eyes, will not answer questions verbally, will only nod or shake head. Escalated attempts to awaken patient. Not responding to sternal rubs. Blood sugar and vitals normal. Patient did awaken fully suddenly after patient was moved up in bed to reposition. She opened eyes and began conversing with staff, c/o pain during movement.  Patient reported she needed to void, assisted x2 assist to [facility] then to chair, positioned with chair alarm and breakfast tray. Will cont to monitor.

## 2019-04-06 NOTE — Progress Notes (Signed)
Orthopedics Progress Note  Subjective: Patient with no complaints this AM  Objective:  Vitals:   04/05/19 2211 04/06/19 0703  BP: (!) 111/56 (!) 146/68  Pulse: 86 86  Resp: 20 20  Temp: 98.9 F (37.2 C) 98.6 F (37 C)  SpO2: 96% 95%    General: Awake and alert  Musculoskeletal: Right hip dressings intact, no erythema and no drainage Neurovascularly intact  Lab Results  Component Value Date   WBC 9.0 04/06/2019   HGB 9.4 (L) 04/06/2019   HCT 30.3 (L) 04/06/2019   MCV 94.7 04/06/2019   PLT 170 04/06/2019       Component Value Date/Time   NA 137 04/06/2019 0226   K 4.0 04/06/2019 0226   CL 105 04/06/2019 0226   CO2 26 04/06/2019 0226   GLUCOSE 119 (H) 04/06/2019 0226   BUN 18 04/06/2019 0226   CREATININE 0.78 04/06/2019 0226   CALCIUM 9.2 04/06/2019 0226   GFRNONAA >60 04/06/2019 0226   GFRAA >60 04/06/2019 0226    Lab Results  Component Value Date   INR 0.9 04/04/2019    Assessment/Plan: POD #3 s/p Procedure(s): INTRAMEDULLARY (IM) NAIL INTERTROCHANTRIC PT, OT - slow progress  Discharge planning DVT prophylaxis with Lovenox and mechanical  Doran Heater. Veverly Fells, MD 04/06/2019 9:19 AM

## 2019-04-06 NOTE — Progress Notes (Signed)
PROGRESS NOTE    Haley Cohen  [id number] DOB: Mar 18, 1932 DOA: 04/03/2019 PCP: [name], MD    Brief Narrative:  85 y.o. female with medical history significant of hypertension, arthritis presenting to the hospital via EMS from her nursing home for evaluation after a fall.  Patient states she was getting ready for bed, using a walker to ambulate and somehow fell.  Since the fall she has been having pain in her back and right hip.  She does not recall having chest pain, lightheadedness, or shortness of breath prior to the fall.  Denies loss of consciousness.  Assessment & Plan:   Principal Problem:   Hip fracture ([place]) Active Problems:   Leukocytosis   HTN (hypertension)   Depression   Hypothyroidism  Acute right hip fracture secondary to mechanical fall -X-ray of right hip/pelvis showing acute comminuted right intertrochanteric fracture. -Orthopedic Surgery consulted and pt is now s/p surgery 8/27 -PT recs for SNF. Dispo planning in progress. Anticipate possible tx to snf within next 24hrs  Leukocytosis -Likely reactive. Afebrile. -improved  Hypertension -Blood pressure currently stable -Continue with hydralazine PRN  Depression -Continue Cymbalta as pt tolerates  Hypothyroidism -Continue Synthroid as tolerated  Dementia -Stable, pt unable to provide any meaningful detailed history at this time -Remains pleasantly confused  DVT prophylaxis: Lovenox, scd's Code Status: Full Family Communication: Pt in room, family not at bedside Disposition Plan: SNF likely 8/30  Consultants:   Orthopedic Surgery  Procedures:  Intramedullary fixation, Right femur 8/27  Antimicrobials: Anti-infectives (From admission, onward)   Start     Dose/Rate Route Frequency Ordered Stop   04/04/19 2000  ceFAZolin (ANCEF) IVPB 2g/100 mL premix     2 g 200 mL/hr over 30 Minutes Intravenous Every 6 hours 04/04/19 1757 04/05/19 0301   04/04/19 0815  ceFAZolin (ANCEF) IVPB  2g/100 mL premix     2 g 200 mL/hr over 30 Minutes Intravenous On call to O.R. 04/04/19 0806 04/04/19 1458      Subjective: Without complaints at this time  Objective: Vitals:   04/05/19 2211 04/06/19 0703 04/06/19 0924 04/06/19 1423  BP: (!) 111/56 (!) 146/68 138/68 117/60  Pulse: 86 86 82 90  Resp: 20 20 16    Temp: 98.9 F (37.2 C) 98.6 F (37 C)    TempSrc: Oral     SpO2: 96% 95% 98% 91%  Weight:      Height:        Intake/Output Summary (Last 24 hours) at 04/06/2019 1529 Last data filed at 04/06/2019 1039 Gross per 24 hour  Intake 120 ml  Output 1000 ml  Net -880 ml   Filed Weights   04/05/19 1825  Weight: 73.6 kg    Examination: General exam: Conversant, in no acute distress Respiratory system: normal chest rise, clear, no audible wheezing  Data Reviewed: I have personally reviewed following labs and imaging studies  CBC: Recent Labs  Lab 04/04/19 0340 04/05/19 0213 04/06/19 0226  WBC 13.3* 11.4* 9.0  HGB 13.2 10.9* 9.4*  HCT 42.5 34.7* 30.3*  MCV 93.8 95.1 94.7  PLT 206 199 [id number]   Basic Metabolic Panel: Recent Labs  Lab 04/04/19 0051 04/05/19 0213 04/06/19 0226  NA 139 138 137  K 3.7 4.5 4.0  CL 105 107 105  CO2 22 23 26   GLUCOSE 132* 148* 119*  BUN 13 13 18   CREATININE 0.83 0.83 0.78  CALCIUM 10.0 9.1 9.2   GFR: Estimated Creatinine Clearance: 52.8 mL/min (by C-G formula based on  SCr of 0.78 mg/dL). Liver Function Tests: No results for input(s): AST, ALT, ALKPHOS, BILITOT, PROT, ALBUMIN in the last 168 hours. No results for input(s): LIPASE, AMYLASE in the last 168 hours. No results for input(s): AMMONIA in the last 168 hours. Coagulation Profile: Recent Labs  Lab 04/04/19 0051  INR 0.9   Cardiac Enzymes: No results for input(s): CKTOTAL, CKMB, CKMBINDEX, TROPONINI in the last 168 hours. BNP (last 3 results) No results for input(s): PROBNP in the last 8760 hours. HbA1C: No results for input(s): HGBA1C in the last 72 hours.  CBG: Recent Labs  Lab 04/06/19 0923  GLUCAP 91   Lipid Profile: No results for input(s): CHOL, HDL, LDLCALC, TRIG, CHOLHDL, LDLDIRECT in the last 72 hours. Thyroid Function Tests: No results for input(s): TSH, T4TOTAL, FREET4, T3FREE, THYROIDAB in the last 72 hours. Anemia Panel: No results for input(s): VITAMINB12, FOLATE, FERRITIN, TIBC, IRON, RETICCTPCT in the last 72 hours. Sepsis Labs: No results for input(s): PROCALCITON, LATICACIDVEN in the last 168 hours.  Recent Results (from the past 240 hour(s))  SARS Coronavirus 2 [facility] order, Performed in [facility] hospital lab) Nasopharyngeal Nasopharyngeal Swab     Status: None   Collection Time: 04/04/19  4:58 AM   Specimen: Nasopharyngeal Swab  Result Value Ref Range Status   SARS Coronavirus 2 NEGATIVE NEGATIVE Final    Comment: (NOTE) If result is NEGATIVE SARS-CoV-2 target nucleic acids are NOT DETECTED. The SARS-CoV-2 RNA is generally detectable in upper and lower  respiratory specimens during the acute phase of infection. The lowest  concentration of SARS-CoV-2 viral copies this assay can detect is 250  copies / mL. A negative result does not preclude SARS-CoV-2 infection  and should not be used as the sole basis for treatment or other  patient management decisions.  A negative result may occur with  improper specimen collection / handling, submission of specimen other  than nasopharyngeal swab, presence of viral mutation(s) within the  areas targeted by this assay, and inadequate number of viral copies  (<250 copies / mL). A negative result must be combined with clinical  observations, patient history, and epidemiological information. If result is POSITIVE SARS-CoV-2 target nucleic acids are DETECTED. The SARS-CoV-2 RNA is generally detectable in upper and lower  respiratory specimens dur ing the acute phase of infection.  Positive  results are indicative of active infection with SARS-CoV-2.  Clinical   correlation with patient history and other diagnostic information is  necessary to determine patient infection status.  Positive results do  not rule out bacterial infection or co-infection with other viruses. If result is PRESUMPTIVE POSTIVE SARS-CoV-2 nucleic acids MAY BE PRESENT.   A presumptive positive result was obtained on the submitted specimen  and confirmed on repeat testing.  While 2019 novel coronavirus  (SARS-CoV-2) nucleic acids may be present in the submitted sample  additional confirmatory testing may be necessary for epidemiological  and / or clinical management purposes  to differentiate between  SARS-CoV-2 and other Sarbecovirus currently known to infect humans.  If clinically indicated additional testing with an alternate test  methodology [phone]) is advised. The SARS-CoV-2 RNA is generally  detectable in upper and lower respiratory sp ecimens during the acute  phase of infection. The expected result is Negative. Fact Sheet for Patients:  StrictlyIdeas.no Fact Sheet for Healthcare Providers: BankingDealers.co.za This test is not yet approved or cleared by the [place] FDA and has been authorized for detection and/or diagnosis of SARS-CoV-2 by FDA under an Emergency Use  Authorization (EUA).  This EUA will remain in effect (meaning this test can be used) for the duration of the COVID-19 declaration under Section 564(b)(1) of the Act, 21 U.S.C. section 360bbb-3(b)(1), unless the authorization is terminated or revoked sooner. Performed at [facility], [place] [address], [place], [place] [address]   Surgical pcr screen     Status: None   Collection Time: 04/04/19  1:04 PM   Specimen: Nasal Mucosa; Nasal Swab  Result Value Ref Range Status   MRSA, PCR NEGATIVE NEGATIVE Final   Staphylococcus aureus NEGATIVE NEGATIVE Final    Comment: (NOTE) The Xpert SA Assay (FDA approved for NASAL specimens in  patients 65 years of age and older), is one component of a comprehensive surveillance program. It is not intended to diagnose infection nor to guide or monitor treatment. Performed at [facility], [place] [address], [place], [place] [address]      Radiology Studies: Pelvis Portable  Result Date: 04/04/2019 CLINICAL DATA:  Post right hip ORIF EXAM: PORTABLE PELVIS 1-2 VIEWS COMPARISON:  Same day radiographs and intraoperative fluoroscopy FINDINGS: Radiograph demonstrates the placement of a right femoral intramedullary rod with transcervical cancellous fixation screw. Alignment across the fracture line is near anatomic. There is increased retraction of the fracture fragment involving the lesser trochanter of the right proximal femur. Overlying soft tissue swelling is noted. The left femoral head remains normally located with mild left hip arthrosis. Remaining bones are unremarkable. Foley catheter in place. IMPRESSION: Right femoral ORIF without evidence of hardware complication. Increased retraction of fracture fragment involving the right lesser trochanter Electronically Signed   By: Lovena Le M.D.   On: 04/04/2019 17:54   Dg C-arm 1-60 Min-no Report  Result Date: 04/04/2019 Fluoroscopy was utilized by the requesting physician.  No radiographic interpretation.   Dg Hip Operative Unilat W Or W/o Pelvis Right  Result Date: 04/04/2019 CLINICAL DATA:  Intramedullary nail placement for right femoral fracture EXAM: OPERATIVE RIGHT HIP (WITH PELVIS IF PERFORMED) 4 VIEWS TECHNIQUE: Fluoroscopic spot image(s) were submitted for interpretation post-operatively. COMPARISON:  Radiographs April 04, 2019 FINDINGS: Initial frontal and frogleg lateral views demonstrate and intertrochanteric right femur fracture. Subsequent images demonstrate the placement a short stem right femoral intramedullary nail with a transcervical cancellous screw and a distal threaded secure screw within the  proximal femoral diaphysis. Post fixation alignment is near anatomic. Expected postsurgical soft tissue changes are noted. IMPRESSION: Right femoral intramedullary nail placement with near anatomic alignment. Electronically Signed   By: Lovena Le M.D.   On: 04/04/2019 17:52    Scheduled Meds: . divalproex  125 mg Oral BID  . docusate sodium  100 mg Oral BID  . DULoxetine  30 mg Oral Daily  . enoxaparin (LOVENOX) injection  40 mg Subcutaneous Q24H  . levothyroxine  25 mcg Oral Q0600  . senna  1 tablet Oral BID  . traZODone  25 mg Oral QHS  . vitamin B-12  1,000 mcg Oral Daily  . vitamin C  500 mg Oral Daily   Continuous Infusions:    LOS: 2 days   Marylu Lund, MD [facility] Pager On Amion  If 7PM-7AM, please contact night-coverage 04/06/2019, 3:29 PM

## 2019-04-06 NOTE — Progress Notes (Signed)
PT Cancellation Note  Patient Details Name: Haley Cohen MRN: [id number] DOB: Jan 17, 1932   Cancelled Treatment:    Reason Eval/Treat Not Completed: Patient declined, no reason specified. Pt asleep on arrival. Upon waking, pt declined mobilizing and stated she was going back to sleep. She requested therapy come back later. Offered pt encouragement to participate, she continued to decline. Will check back as time allows.   Benjiman Core, PTA Pager [phone] Acute Rehab   Allena Katz 04/06/2019, 2:42 PM

## 2019-04-07 LAB — CBC
HCT: 29 % — ABNORMAL LOW (ref 36.0–46.0)
Hemoglobin: 8.9 g/dL — ABNORMAL LOW (ref 12.0–15.0)
MCH: 29.5 pg (ref 26.0–34.0)
MCHC: 30.7 g/dL (ref 30.0–36.0)
MCV: 96 fL (ref 80.0–100.0)
Platelets: 177 10*3/uL (ref 150–400)
RBC: 3.02 MIL/uL — ABNORMAL LOW (ref 3.87–5.11)
RDW: 14.4 % (ref 11.5–15.5)
WBC: 8 10*3/uL (ref 4.0–10.5)
nRBC: 0 % (ref 0.0–0.2)

## 2019-04-07 NOTE — Discharge Summary (Signed)
Physician Discharge Summary  Haley Cohen [id number] DOB: 1932-06-23 DOA: 04/03/2019  PCP: [name], MD  Admit date: 04/03/2019 Discharge date: 04/07/2019  Admitted From: Home Disposition:  SNF  Recommendations for Outpatient Follow-up:  1. Follow up with PCP in 1-2 weeks 2. Follow up with Orthopedic Surgery as scheduled  Discharge Condition:Improved CODE STATUS:Full Diet recommendation: Regular   Brief/Interim Summary: 85 y.o.femalewith medical history significant ofhypertension,arthritis presenting to the hospital via EMS from her nursing home for evaluation after a fall.Patient states she was getting ready for bed, using a walker to ambulate and somehow fell. Since the fall she has been having pain in her back and right hip. She does not recall having chest pain, lightheadedness, or shortness of breath prior to the fall. Denies loss of consciousness.   Discharge Diagnoses:  Principal Problem:   Hip fracture ([place]) Active Problems:   Leukocytosis   HTN (hypertension)   Depression   Hypothyroidism  Acute right hip fracture secondary to mechanical fall -X-ray of right hip/pelvis showing acute comminuted right intertrochanteric fracture. -Orthopedic Surgery consulted and pt is now s/p surgery 8/27 -Plan d/c to SNF  Leukocytosis -Likely reactive.Afebrile. -improved  Hypertension -Blood pressure currently stable  Depression -Continue Cymbalta as pt tolerates  Hypothyroidism -Continue Synthroid as tolerated  Dementia -Stable, pt unable to provide any meaningful detailed history at this time -Remains pleasantly confused and likely baseline   Discharge Instructions   Allergies as of 04/07/2019   No Known Allergies     Medication List    STOP taking these medications   acetaminophen 325 MG tablet Commonly known as: TYLENOL   cloNIDine 0.1 mg/24hr patch Commonly known as: CATAPRES - Dosed in mg/24 hr     TAKE these medications    cholecalciferol 25 MCG (1000 UT) tablet Commonly known as: VITAMIN D3 Take 1,000 Units by mouth daily.   divalproex 125 MG capsule Commonly known as: DEPAKOTE SPRINKLE Take 125 mg by mouth 2 (two) times daily.   docusate sodium 100 MG capsule Commonly known as: COLACE Take 1 capsule (100 mg total) by mouth 2 (two) times daily.   DULoxetine 30 MG capsule Commonly known as: CYMBALTA Take 30 mg by mouth daily.   enoxaparin 40 MG/0.4ML injection Commonly known as: LOVENOX Inject 0.4 mLs (40 mg total) into the skin daily.   HYDROcodone-acetaminophen 5-325 MG tablet Commonly known as: NORCO/VICODIN Take 1 tablet by mouth every 4 (four) hours as needed for moderate pain.   levothyroxine 25 MCG tablet Commonly known as: SYNTHROID Take 25 mcg by mouth daily before breakfast.   Melatonin 5 MG Tabs Take 5 mg by mouth at bedtime as needed (sleep).   traZODone 50 MG tablet Commonly known as: DESYREL Take 25 mg by mouth at bedtime.   vitamin B-12 1000 MCG tablet Commonly known as: CYANOCOBALAMIN Take 1,000 mcg by mouth daily.   vitamin C 500 MG tablet Commonly known as: ASCORBIC ACID Take 500 mg by mouth daily.      Follow-up Information    Swinteck, Aaron Edelman, MD. Schedule an appointment as soon as possible for a visit in 2 weeks.   Specialty: Orthopedic Surgery Why: For wound re-check Contact information: [address] [place] [address] [phone]        Follow up with PCP in 1-2 weeks Follow up.          No Known Allergies  Consultations:  Orthopedic Surgery  Procedures/Studies: Dg Chest 1 View  Result Date: 04/04/2019 CLINICAL DATA:  Fall EXAM: CHEST  1 VIEW COMPARISON:  None. FINDINGS: Low lung volumes. Linear atelectasis or scarring at the left mid lung and left lung base. Right lung is clear. Normal heart size. No pneumothorax. IMPRESSION: Low lung volumes with atelectasis and or scarring in the left mid lung and left lung base  Electronically Signed   By: Donavan Foil M.D.   On: 04/04/2019 03:38   Dg Thoracic Spine 2 View  Result Date: 04/04/2019 CLINICAL DATA:  Fall EXAM: THORACIC SPINE 2 VIEWS COMPARISON:  None. FINDINGS: Limited by positioning. Thoracic alignment grossly within normal limits. Vertebral body heights appear maintained. Diffuse osteophytosis of the thoracic spine. IMPRESSION: No definite acute osseous abnormality. Electronically Signed   By: Donavan Foil M.D.   On: 04/04/2019 03:45   Dg Lumbar Spine Complete  Result Date: 04/04/2019 CLINICAL DATA:  Fall EXAM: LUMBAR SPINE - COMPLETE 4+ VIEW COMPARISON:  None. FINDINGS: Transitional anatomy suspected with 4 non rib-bearing vertebra. First non rib-bearing lumbar vertebra will be designated L1. Left lateral positioning of L2 with respect to L3 on the frontal view. This is of uncertain chronicity. Suspected lucency about the right posterior rod and fixating screws at L3-L4. Vertebral body heights are grossly maintained. Prominent degenerative changes at L3-L4. Diffuse osteophytes of the lumbar spine. IMPRESSION: 1. Suspected transitional anatomy. First non rib-bearing lumbar vertebra is designated L1 for the purposes of reporting 2. Somewhat abrupt appearing left lateral displacement of L2 with respect to L3 on AP view of uncertain chronicity. There is posterior rod and fixating screws at L3-L4 with suspected lucency/loosening about the right pedicle screws, and possible partial dislodgement of the rods and fixating screws on the right side. No gross acute fracture is seen. Electronically Signed   By: Donavan Foil M.D.   On: 04/04/2019 03:43   Ct Head Wo Contrast  Result Date: 04/04/2019 CLINICAL DATA:  85 year old female with fall EXAM: CT HEAD WITHOUT CONTRAST CT CERVICAL SPINE WITHOUT CONTRAST TECHNIQUE: Multidetector CT imaging of the head and cervical spine was performed following the standard protocol without intravenous contrast. Multiplanar CT image  reconstructions of the cervical spine were also generated. COMPARISON:  None. FINDINGS: CT HEAD FINDINGS Brain: There is mild age-related atrophy and chronic microvascular ischemic changes. There is no acute intracranial. No mass effect or midline shift. No extra-axial fluid collection. Vascular: No hyperdense vessel or unexpected calcification. Skull: Normal. Negative for fracture or focal lesion. Sinuses/Orbits: No acute finding. Other: None CT CERVICAL SPINE FINDINGS Evaluation of this exam is limited due to motion artifact. Alignment: No acute subluxation. There is grade 1 C4-C5 anterolisthesis. Skull base and vertebrae: No definite acute fracture. C4 and C5 laminectomy changes. Soft tissues and spinal canal: No prevertebral fluid or swelling. No visible canal hematoma. Disc levels: Multilevel degenerative changes and disc disease with endplate irregularity and disc space narrowing. Multilevel facet arthropathy. Upper chest: Negative. Other: Bilateral carotid bulb calcified plaques. IMPRESSION: 1. No acute intracranial hemorrhage. 2. Mild age-related atrophy and chronic microvascular ischemic changes. 3. No acute/traumatic cervical spine pathology. Electronically Signed   By: Anner Crete M.D.   On: 04/04/2019 03:43   Ct Cervical Spine Wo Contrast  Result Date: 04/04/2019 CLINICAL DATA:  85 year old female with fall EXAM: CT HEAD WITHOUT CONTRAST CT CERVICAL SPINE WITHOUT CONTRAST TECHNIQUE: Multidetector CT imaging of the head and cervical spine was performed following the standard protocol without intravenous contrast. Multiplanar CT image reconstructions of the cervical spine were also generated. COMPARISON:  None. FINDINGS: CT HEAD FINDINGS Brain:  There is mild age-related atrophy and chronic microvascular ischemic changes. There is no acute intracranial. No mass effect or midline shift. No extra-axial fluid collection. Vascular: No hyperdense vessel or unexpected calcification. Skull: Normal.  Negative for fracture or focal lesion. Sinuses/Orbits: No acute finding. Other: None CT CERVICAL SPINE FINDINGS Evaluation of this exam is limited due to motion artifact. Alignment: No acute subluxation. There is grade 1 C4-C5 anterolisthesis. Skull base and vertebrae: No definite acute fracture. C4 and C5 laminectomy changes. Soft tissues and spinal canal: No prevertebral fluid or swelling. No visible canal hematoma. Disc levels: Multilevel degenerative changes and disc disease with endplate irregularity and disc space narrowing. Multilevel facet arthropathy. Upper chest: Negative. Other: Bilateral carotid bulb calcified plaques. IMPRESSION: 1. No acute intracranial hemorrhage. 2. Mild age-related atrophy and chronic microvascular ischemic changes. 3. No acute/traumatic cervical spine pathology. Electronically Signed   By: Anner Crete M.D.   On: 04/04/2019 03:43   Pelvis Portable  Result Date: 04/04/2019 CLINICAL DATA:  Post right hip ORIF EXAM: PORTABLE PELVIS 1-2 VIEWS COMPARISON:  Same day radiographs and intraoperative fluoroscopy FINDINGS: Radiograph demonstrates the placement of a right femoral intramedullary rod with transcervical cancellous fixation screw. Alignment across the fracture line is near anatomic. There is increased retraction of the fracture fragment involving the lesser trochanter of the right proximal femur. Overlying soft tissue swelling is noted. The left femoral head remains normally located with mild left hip arthrosis. Remaining bones are unremarkable. Foley catheter in place. IMPRESSION: Right femoral ORIF without evidence of hardware complication. Increased retraction of fracture fragment involving the right lesser trochanter Electronically Signed   By: Lovena Le M.D.   On: 04/04/2019 17:54   Dg Knee Right Port  Result Date: 04/04/2019 CLINICAL DATA:  Right knee pain after fall. EXAM: PORTABLE RIGHT KNEE - 1-2 VIEW COMPARISON:  None. FINDINGS: Status post right total  knee arthroplasty. The right femoral and tibial components appear to be well situated. No joint effusion is noted. No fracture or dislocation is noted. No soft tissue abnormality is noted. IMPRESSION: Status post right total knee arthroplasty. No acute abnormality seen in the right knee. Electronically Signed   By: Marijo Conception M.D.   On: 04/04/2019 08:50   Dg C-arm 1-60 Min-no Report  Result Date: 04/04/2019 Fluoroscopy was utilized by the requesting physician.  No radiographic interpretation.   Dg Hip Operative Unilat W Or W/o Pelvis Right  Result Date: 04/04/2019 CLINICAL DATA:  Intramedullary nail placement for right femoral fracture EXAM: OPERATIVE RIGHT HIP (WITH PELVIS IF PERFORMED) 4 VIEWS TECHNIQUE: Fluoroscopic spot image(s) were submitted for interpretation post-operatively. COMPARISON:  Radiographs April 04, 2019 FINDINGS: Initial frontal and frogleg lateral views demonstrate and intertrochanteric right femur fracture. Subsequent images demonstrate the placement a short stem right femoral intramedullary nail with a transcervical cancellous screw and a distal threaded secure screw within the proximal femoral diaphysis. Post fixation alignment is near anatomic. Expected postsurgical soft tissue changes are noted. IMPRESSION: Right femoral intramedullary nail placement with near anatomic alignment. Electronically Signed   By: Lovena Le M.D.   On: 04/04/2019 17:52   Dg Hip Unilat With Pelvis 2-3 Views Right  Result Date: 04/04/2019 CLINICAL DATA:  Fall with hip pain EXAM: DG HIP (WITH OR WITHOUT PELVIS) 2-3V RIGHT COMPARISON:  None. FINDINGS: Right SI joint is non widened. Pubic symphysis is intact. Acute comminuted right intertrochanteric fracture. Right femoral head projects in joint. Skin fold artifact versus nondisplaced fracture over the right inferior pubic ramus. IMPRESSION:  1. Acute comminuted right intertrochanteric fracture. 2. Skin fold artifact versus nondisplaced fracture over  the right inferior pubic ramus Electronically Signed   By: Donavan Foil M.D.   On: 04/04/2019 03:47     Subjective: Eager to go to rehab  Discharge Exam: Vitals:   04/06/19 2152 04/07/19 0545  BP: 132/71 (!) 159/67  Pulse: 81 80  Resp: 16 16  Temp: 98.2 F (36.8 C) 99.4 F (37.4 C)  SpO2: 100% 93%   Vitals:   04/06/19 0924 04/06/19 1423 04/06/19 2152 04/07/19 0545  BP: 138/68 117/60 132/71 (!) 159/67  Pulse: 82 90 81 80  Resp: 16  16 16   Temp:   98.2 F (36.8 C) 99.4 F (37.4 C)  TempSrc:   Oral Oral  SpO2: 98% 91% 100% 93%  Weight:      Height:        General: Pt is alert, awake, not in acute distress Cardiovascular: RRR, S1/S2 +, no rubs, no gallops Respiratory: CTA bilaterally, no wheezing, no rhonchi Abdominal: Soft, NT, ND, bowel sounds + Extremities: no edema, no cyanosis   The results of significant diagnostics from this hospitalization (including imaging, microbiology, ancillary and laboratory) are listed below for reference.     Microbiology: Recent Results (from the past 240 hour(s))  SARS Coronavirus 2 [facility] order, Performed in [facility] hospital lab) Nasopharyngeal Nasopharyngeal Swab     Status: None   Collection Time: 04/04/19  4:58 AM   Specimen: Nasopharyngeal Swab  Result Value Ref Range Status   SARS Coronavirus 2 NEGATIVE NEGATIVE Final    Comment: (NOTE) If result is NEGATIVE SARS-CoV-2 target nucleic acids are NOT DETECTED. The SARS-CoV-2 RNA is generally detectable in upper and lower  respiratory specimens during the acute phase of infection. The lowest  concentration of SARS-CoV-2 viral copies this assay can detect is 250  copies / mL. A negative result does not preclude SARS-CoV-2 infection  and should not be used as the sole basis for treatment or other  patient management decisions.  A negative result may occur with  improper specimen collection / handling, submission of specimen other  than nasopharyngeal swab, presence of  viral mutation(s) within the  areas targeted by this assay, and inadequate number of viral copies  (<250 copies / mL). A negative result must be combined with clinical  observations, patient history, and epidemiological information. If result is POSITIVE SARS-CoV-2 target nucleic acids are DETECTED. The SARS-CoV-2 RNA is generally detectable in upper and lower  respiratory specimens dur ing the acute phase of infection.  Positive  results are indicative of active infection with SARS-CoV-2.  Clinical  correlation with patient history and other diagnostic information is  necessary to determine patient infection status.  Positive results do  not rule out bacterial infection or co-infection with other viruses. If result is PRESUMPTIVE POSTIVE SARS-CoV-2 nucleic acids MAY BE PRESENT.   A presumptive positive result was obtained on the submitted specimen  and confirmed on repeat testing.  While 2019 novel coronavirus  (SARS-CoV-2) nucleic acids may be present in the submitted sample  additional confirmatory testing may be necessary for epidemiological  and / or clinical management purposes  to differentiate between  SARS-CoV-2 and other Sarbecovirus currently known to infect humans.  If clinically indicated additional testing with an alternate test  methodology [phone]) is advised. The SARS-CoV-2 RNA is generally  detectable in upper and lower respiratory sp ecimens during the acute  phase of infection. The expected result is Negative. Fact  Sheet for Patients:  StrictlyIdeas.no Fact Sheet for Healthcare Providers: BankingDealers.co.za This test is not yet approved or cleared by the [place] FDA and has been authorized for detection and/or diagnosis of SARS-CoV-2 by FDA under an Emergency Use Authorization (EUA).  This EUA will remain in effect (meaning this test can be used) for the duration of the COVID-19 declaration under Section  564(b)(1) of the Act, 21 U.S.C. section 360bbb-3(b)(1), unless the authorization is terminated or revoked sooner. Performed at [facility], [place] [address], [place], [place] [address]   Surgical pcr screen     Status: None   Collection Time: 04/04/19  1:04 PM   Specimen: Nasal Mucosa; Nasal Swab  Result Value Ref Range Status   MRSA, PCR NEGATIVE NEGATIVE Final   Staphylococcus aureus NEGATIVE NEGATIVE Final    Comment: (NOTE) The Xpert SA Assay (FDA approved for NASAL specimens in patients 38 years of age and older), is one component of a comprehensive surveillance program. It is not intended to diagnose infection nor to guide or monitor treatment. Performed at [facility], [place] [address] [place], [place] [address]      Labs: BNP (last 3 results) No results for input(s): BNP in the last 8760 hours. Basic Metabolic Panel: Recent Labs  Lab 04/04/19 0051 04/05/19 0213 04/06/19 0226  NA 139 138 137  K 3.7 4.5 4.0  CL 105 107 105  CO2 22 23 26   GLUCOSE 132* 148* 119*  BUN 13 13 18   CREATININE 0.83 0.83 0.78  CALCIUM 10.0 9.1 9.2   Liver Function Tests: No results for input(s): AST, ALT, ALKPHOS, BILITOT, PROT, ALBUMIN in the last 168 hours. No results for input(s): LIPASE, AMYLASE in the last 168 hours. No results for input(s): AMMONIA in the last 168 hours. CBC: Recent Labs  Lab 04/04/19 0340 04/05/19 0213 04/06/19 0226 04/07/19 0324  WBC 13.3* 11.4* 9.0 8.0  HGB 13.2 10.9* 9.4* 8.9*  HCT 42.5 34.7* 30.3* 29.0*  MCV 93.8 95.1 94.7 96.0  PLT 206 199 170 177   Cardiac Enzymes: No results for input(s): CKTOTAL, CKMB, CKMBINDEX, TROPONINI in the last 168 hours. BNP: Invalid input(s): POCBNP CBG: Recent Labs  Lab 04/06/19 0923  GLUCAP 91   D-Dimer No results for input(s): DDIMER in the last 72 hours. Hgb A1c No results for input(s): HGBA1C in the last 72 hours. Lipid Profile No results for input(s): CHOL, HDL,  LDLCALC, TRIG, CHOLHDL, LDLDIRECT in the last 72 hours. Thyroid function studies No results for input(s): TSH, T4TOTAL, T3FREE, THYROIDAB in the last 72 hours.  Invalid input(s): FREET3 Anemia work up No results for input(s): VITAMINB12, FOLATE, FERRITIN, TIBC, IRON, RETICCTPCT in the last 72 hours. Urinalysis    Component Value Date/Time   COLORURINE YELLOW 04/05/2019 0430   APPEARANCEUR CLEAR 04/05/2019 0430   LABSPEC 1.021 04/05/2019 0430   PHURINE 5.0 04/05/2019 0430   GLUCOSEU NEGATIVE 04/05/2019 0430   HGBUR SMALL (A) 04/05/2019 0430   BILIRUBINUR NEGATIVE 04/05/2019 0430   KETONESUR NEGATIVE 04/05/2019 0430   PROTEINUR NEGATIVE 04/05/2019 0430   NITRITE NEGATIVE 04/05/2019 0430   LEUKOCYTESUR SMALL (A) 04/05/2019 0430   Sepsis Labs Invalid input(s): PROCALCITONIN,  WBC,  LACTICIDVEN Microbiology Recent Results (from the past 240 hour(s))  SARS Coronavirus 2 [facility] order, Performed in [facility] hospital lab) Nasopharyngeal Nasopharyngeal Swab     Status: None   Collection Time: 04/04/19  4:58 AM   Specimen: Nasopharyngeal Swab  Result Value Ref Range Status  SARS Coronavirus 2 NEGATIVE NEGATIVE Final    Comment: (NOTE) If result is NEGATIVE SARS-CoV-2 target nucleic acids are NOT DETECTED. The SARS-CoV-2 RNA is generally detectable in upper and lower  respiratory specimens during the acute phase of infection. The lowest  concentration of SARS-CoV-2 viral copies this assay can detect is 250  copies / mL. A negative result does not preclude SARS-CoV-2 infection  and should not be used as the sole basis for treatment or other  patient management decisions.  A negative result may occur with  improper specimen collection / handling, submission of specimen other  than nasopharyngeal swab, presence of viral mutation(s) within the  areas targeted by this assay, and inadequate number of viral copies  (<250 copies / mL). A negative result must be combined with clinical   observations, patient history, and epidemiological information. If result is POSITIVE SARS-CoV-2 target nucleic acids are DETECTED. The SARS-CoV-2 RNA is generally detectable in upper and lower  respiratory specimens dur ing the acute phase of infection.  Positive  results are indicative of active infection with SARS-CoV-2.  Clinical  correlation with patient history and other diagnostic information is  necessary to determine patient infection status.  Positive results do  not rule out bacterial infection or co-infection with other viruses. If result is PRESUMPTIVE POSTIVE SARS-CoV-2 nucleic acids MAY BE PRESENT.   A presumptive positive result was obtained on the submitted specimen  and confirmed on repeat testing.  While 2019 novel coronavirus  (SARS-CoV-2) nucleic acids may be present in the submitted sample  additional confirmatory testing may be necessary for epidemiological  and / or clinical management purposes  to differentiate between  SARS-CoV-2 and other Sarbecovirus currently known to infect humans.  If clinically indicated additional testing with an alternate test  methodology [phone]) is advised. The SARS-CoV-2 RNA is generally  detectable in upper and lower respiratory sp ecimens during the acute  phase of infection. The expected result is Negative. Fact Sheet for Patients:  StrictlyIdeas.no Fact Sheet for Healthcare Providers: BankingDealers.co.za This test is not yet approved or cleared by the [place] FDA and has been authorized for detection and/or diagnosis of SARS-CoV-2 by FDA under an Emergency Use Authorization (EUA).  This EUA will remain in effect (meaning this test can be used) for the duration of the COVID-19 declaration under Section 564(b)(1) of the Act, 21 U.S.C. section 360bbb-3(b)(1), unless the authorization is terminated or revoked sooner. Performed at [facility], [place]  [address] [place], [place] [address]   Surgical pcr screen     Status: None   Collection Time: 04/04/19  1:04 PM   Specimen: Nasal Mucosa; Nasal Swab  Result Value Ref Range Status   MRSA, PCR NEGATIVE NEGATIVE Final   Staphylococcus aureus NEGATIVE NEGATIVE Final    Comment: (NOTE) The Xpert SA Assay (FDA approved for NASAL specimens in patients 52 years of age and older), is one component of a comprehensive surveillance program. It is not intended to diagnose infection nor to guide or monitor treatment. Performed at [facility], [place] [address], [place], [place] [address]    Time spent: 30 min  SIGNED:   Marylu Lund, MD  [facility] 04/07/2019, 9:29 AM  If 7PM-7AM, please contact night-coverage

## 2019-04-07 NOTE — Plan of Care (Signed)
  Problem: Pain Managment: Goal: General experience of comfort will improve 04/07/2019 1420 by Audrea Muscat, RN Outcome: Adequate for Discharge 04/07/2019 1420 by Audrea Muscat, RN Outcome: Adequate for Discharge   Problem: Safety: Goal: Ability to remain free from injury will improve 04/07/2019 1420 by Audrea Muscat, RN Outcome: Adequate for Discharge 04/07/2019 1420 by Audrea Muscat, RN Outcome: Adequate for Discharge   Problem: Pain Management: Goal: Pain level will decrease 04/07/2019 1420 by Audrea Muscat, RN Outcome: Adequate for Discharge 04/07/2019 1420 by Audrea Muscat, RN Outcome: Adequate for Discharge   Problem: Acute Rehab PT Goals(only PT should resolve) Goal: Pt Will Go Supine/Side To Sit Outcome: Adequate for Discharge Goal: Patient Will Transfer Sit To/From Stand Outcome: Adequate for Discharge Goal: Pt Will Transfer Bed To Chair/Chair To Bed Outcome: Adequate for Discharge Goal: Pt/caregiver will Perform Home Exercise Program Outcome: Adequate for Discharge

## 2019-04-07 NOTE — TOC Transition Note (Signed)
Transition of Care [facility]) - CM/SW Discharge Note   Patient Details  Name: Haley Cohen MRN: [id number] Date of Birth: May 13, 1932  Transition of Care [facility]) CM/SW Contact:  Nila Nephew, LCSW Phone Number: [phone] 04/07/2019, 12:15 PM   Clinical Narrative:   Pt admitting to [place] room 924, report [phone] Marye Round). Pt's son aware/agreeable. Provided DC summary and updated FL2 to [place] at [facility].          Social Determinants of Health (SDOH) Interventions     Readmission Risk Interventions No flowsheet data found.

## 2019-04-08 ENCOUNTER — Encounter: Payer: Self-pay | Admitting: Nurse Practitioner

## 2019-04-08 ENCOUNTER — Non-Acute Institutional Stay (SKILLED_NURSING_FACILITY): Payer: Medicare Other | Admitting: Nurse Practitioner

## 2019-04-08 DIAGNOSIS — F329 Major depressive disorder, single episode, unspecified: Secondary | ICD-10-CM

## 2019-04-08 DIAGNOSIS — R269 Unspecified abnormalities of gait and mobility: Secondary | ICD-10-CM | POA: Diagnosis not present

## 2019-04-08 DIAGNOSIS — F0150 Vascular dementia without behavioral disturbance: Secondary | ICD-10-CM

## 2019-04-08 DIAGNOSIS — I10 Essential (primary) hypertension: Secondary | ICD-10-CM | POA: Diagnosis not present

## 2019-04-08 DIAGNOSIS — F0280 Dementia in other diseases classified elsewhere without behavioral disturbance: Secondary | ICD-10-CM

## 2019-04-08 DIAGNOSIS — G309 Alzheimer's disease, unspecified: Secondary | ICD-10-CM

## 2019-04-08 DIAGNOSIS — S72001A Fracture of unspecified part of neck of right femur, initial encounter for closed fracture: Secondary | ICD-10-CM | POA: Diagnosis not present

## 2019-04-08 DIAGNOSIS — F32A Depression, unspecified: Secondary | ICD-10-CM

## 2019-04-08 DIAGNOSIS — E039 Hypothyroidism, unspecified: Secondary | ICD-10-CM | POA: Diagnosis not present

## 2019-04-08 NOTE — Progress Notes (Signed)
Location:  [place] Room Number: [place] of Service:  SNF (31) Provider:  Levander Katzenstein, Lennie Odor  NP  [name], MD  Patient Care Team: [name], MD as PCP - General (Internal Medicine) Jidenna Figgs X, NP as Nurse Practitioner (Internal Medicine)  Extended Emergency Contact Information Primary Emergency Contact: [facility] Address: [address]          [place], [place] [address] [name] of [place] Phone: [phone] Relation: Son  Code Status:  DNR Goals of care: Advanced Directive information Advanced Directives 03/26/2019  Does Patient Have a Medical Advance Directive? Yes  Type of Paramedic of [place];Out of facility DNR (pink MOST or yellow form)  Does patient want to make changes to medical advance directive? No - Patient declined  Copy of [place] in Chart? Yes - validated most recent copy scanned in chart (See row information)  Pre-existing out of facility DNR order (yellow form or pink MOST form) Yellow form placed in chart (order not valid for inpatient use)     Chief Complaint  Patient presents with  . Acute Visit    C/o - medication    HPI:  Pt is a 85 y.o. female seen today for an acute visit for    Past Medical History:  Diagnosis Date  . Alzheimer disease ([place]) 10/27/2016  . Depression, recurrent ([place]) 10/27/2016  . HTN (hypertension) 10/27/2016  . Hypertension   . Hypertensive kidney disease with CKD (chronic kidney disease) stage V ([place]) 11/02/2016   11/02/16 Na 136, K 4.7, Bun 30, creat 1.52, TSH 0.86, wbc 6.7, Hgb 11.3, plt 264  . Hypothyroidism 10/27/2016  . Osteoarthritis 10/27/2016  . Thyroid disease    Past Surgical History:  Procedure Laterality Date  . ABDOMINAL HYSTERECTOMY  1986  . KNEE ARTHROSCOPY  2015/2017   Dr. Gustavus Bryant  . SPINE SURGERY  2009, 2011   Dr.Mark Nicoletta Dress    No Known Allergies  Outpatient Encounter Medications as of 04/08/2019   Medication Sig  . cholecalciferol (VITAMIN D) 1000 units tablet Take 1,000 Units by mouth daily.  . divalproex (DEPAKOTE SPRINKLE) 125 MG capsule Take 125 mg by mouth 2 (two) times daily.  Marland Kitchen docusate sodium (COLACE) 100 MG capsule Take 100 mg by mouth 2 (two) times daily.  . DULoxetine (CYMBALTA) 30 MG capsule Take 30 mg by mouth daily.   Marland Kitchen HYDROcodone-acetaminophen (NORCO/VICODIN) 5-325 MG tablet Take 1 tablet by mouth as needed for moderate pain.  Marland Kitchen levothyroxine (SYNTHROID, LEVOTHROID) 25 MCG tablet Take 25 mcg by mouth daily before breakfast.  . Melatonin 5 MG TABS Take 5 mg by mouth at bedtime as needed.  . traZODone (DESYREL) 50 MG tablet Take 25 mg by mouth at bedtime.  . vitamin B-12 (CYANOCOBALAMIN) 1000 MCG tablet Take 1,000 mcg by mouth daily.  . vitamin C (ASCORBIC ACID) 500 MG tablet Take 500 mg by mouth daily.  . [DISCONTINUED] acetaminophen (TYLENOL) 325 MG tablet Take 650 mg by mouth every 8 (eight) hours as needed for mild pain.   . [DISCONTINUED] cloNIDine (CATAPRES - DOSED IN MG/24 HR) 0.1 mg/24hr patch Place 0.1 mg onto the skin once a week. On Monday. REMOVE OLD PATCH BEFORE APPLYING NEW PATCH AND ROTATE SITES   No facility-administered encounter medications on file as of 04/08/2019.     Review of Systems  Immunization History  Administered Date(s) Administered  . Influenza-Unspecified 05/29/2017  . Pneumococcal Conjugate-13 06/21/2017  . Tdap 06/21/2017  Pertinent  Health Maintenance Due  Topic Date Due  . PNA vac Low Risk Adult (2 of 2 - PPSV23) 06/21/2018  . INFLUENZA VACCINE  03/09/2019  . DEXA SCAN  Completed   Fall Risk  04/27/2018 04/25/2017  Falls in the past year? No No   Functional Status Survey:    Vitals:   04/08/19 1247  BP: (!) 160/78  Pulse: 86  Resp: 20  Temp: 98.1 F (36.7 C)  SpO2: 96%  Weight: 149 lb 12.8 oz (67.9 kg)  Height: 4\' 11"  (1.499 m)   Body mass index is 30.26 kg/m. Physical Exam  Labs reviewed: Recent Labs     08/16/18 10/26/18  NA 141 140  K 4.1 4.3  CL 109  --   CO2 25  --   BUN 12 11  CREATININE 1.0 0.9  CALCIUM 9.6  --    Recent Labs    08/16/18 10/26/18  AST 9* 13  ALT 7 8  ALKPHOS 73  --   PROT 5.9  --   ALBUMIN 3.4  --    Recent Labs    05/01/18 08/16/18 10/26/18  WBC 8.1 6.5  --   HGB 13.7 11.8* 11.7*  HCT 41 36 36  PLT 244 224 219   Lab Results  Component Value Date   TSH 1.94 08/16/2018   Lab Results  Component Value Date   HGBA1C 5.3 11/01/2016   No results found for: CHOL, HDL, LDLCALC, LDLDIRECT, TRIG, CHOLHDL  Significant Diagnostic Results in last 30 days:  No results found.  Assessment/Plan No problem-specific Assessment & Plan notes found for this encounter.     Family/ staff Communication: plan of care reviewed with the patient and charge nurse.   Labs/tests ordered: none  Time spend 35 minutes.  This encounter was created in error - please disregard.

## 2019-04-08 NOTE — Progress Notes (Signed)
Location:  [place] Room Number: [place] of Service:  SNF (31) Provider:  Velda Wendt, Lennie Odor  NP  [name], MD  Patient Care Team: [name], MD as PCP - General (Internal Medicine) Leevon Upperman X, NP as Nurse Practitioner (Internal Medicine) [name], MD (Internal Medicine)  Extended Emergency Contact Information Primary Emergency Contact: [facility] Address: [address]          [place], [place] [address] [name] of [place] Phone: [phone] Relation: Son  Code Status:  DNR Goals of care: Advanced Directive information Advanced Directives 04/09/2019  Does Patient Have a Medical Advance Directive? Yes  Type of Advance Directive Out of facility DNR (pink MOST or yellow form)  Does patient want to make changes to medical advance directive? No - Patient declined  Copy of [place] in Chart? -  Pre-existing out of facility DNR order (yellow form or pink MOST form) Yellow form placed in chart (order not valid for inpatient use)     Chief Complaint  Patient presents with   Acute Visit    C/o - medication    HPI:  Pt is a 85 y.o. female seen today for an acute visit for review medications following hospital stay 04/03/19-04/07/19 for acute right hip fracture 2nd to a mechanical fall, s/p 04/04/19 IM nail intertrochanteric R. Lovenox 40mg  qd, f/u Ortho. Prn Norco 5/325mg  available to her.    Hx of dementia, HTN, depression-on Trazodone 25mg  qd, Duloxetine 30mg  qd. Depakote 125mg  bid,  Hypothyroidism, on Levothyroxine 63mcg qd.  Past Medical History:  Diagnosis Date   Alzheimer disease ([place]) 10/27/2016   Arthritis    Confusion 04/04/2019   Depression, recurrent ([place]) 10/27/2016   HTN (hypertension) 10/27/2016   Hypertension    Hypertensive kidney disease with CKD (chronic kidney disease) stage V (HCC) 11/02/2016   11/02/16 Na 136, K 4.7, Bun 30, creat 1.52, TSH 0.86, wbc 6.7, Hgb 11.3, plt 264    Hypothyroidism 10/27/2016   Osteoarthritis 10/27/2016   Thyroid disease    Past Surgical History:  Procedure Laterality Date   ABDOMINAL HYSTERECTOMY  1986   INTRAMEDULLARY (IM) NAIL INTERTROCHANTERIC Right 04/04/2019   Procedure: INTRAMEDULLARY (IM) NAIL INTERTROCHANTRIC;  Surgeon: Rod Can, MD;  Location: [facility];  Service: Orthopedics;  Laterality: Right;   KNEE ARTHROSCOPY  2015/2017   Dr. Gustavus Bryant   SPINE SURGERY  2009, 2011   Dr.Mark Nicoletta Dress    No Known Allergies  Outpatient Encounter Medications as of 04/08/2019  Medication Sig   cholecalciferol (VITAMIN D3) 25 MCG (1000 UT) tablet Take 1,000 Units by mouth daily.   divalproex (DEPAKOTE SPRINKLE) 125 MG capsule Take 125 mg by mouth 2 (two) times daily.   docusate sodium (COLACE) 100 MG capsule Take 1 capsule (100 mg total) by mouth 2 (two) times daily.   DULoxetine (CYMBALTA) 30 MG capsule Take 30 mg by mouth daily.   enoxaparin (LOVENOX) 40 MG/0.4ML injection Inject 40 mg into the skin daily.   HYDROcodone-acetaminophen (NORCO/VICODIN) 5-325 MG tablet Take 1 tablet by mouth as needed for moderate pain.   levothyroxine (SYNTHROID) 25 MCG tablet Take 25 mcg by mouth daily before breakfast.   Melatonin 5 MG TABS Take 5 mg by mouth at bedtime as needed (sleep).   traZODone (DESYREL) 50 MG tablet Take 25 mg by mouth at bedtime.   vitamin B-12 (CYANOCOBALAMIN) 1000 MCG tablet Take 1,000 mcg by mouth daily.   vitamin C (ASCORBIC ACID) 500 MG  tablet Take 500 mg by mouth daily.   [DISCONTINUED] enoxaparin (LOVENOX) 40 MG/0.4ML injection Inject 0.4 mLs (40 mg total) into the skin daily.   [DISCONTINUED] HYDROcodone-acetaminophen (NORCO/VICODIN) 5-325 MG tablet Take 1 tablet by mouth every 4 (four) hours as needed for moderate pain.   No facility-administered encounter medications on file as of 04/08/2019.    ROS was provided with assistance of staff.  Review of Systems  Constitutional: Negative for activity change,  chills, diaphoresis and fever.  HENT: Positive for hearing loss. Negative for congestion and voice change.   Respiratory: Negative for cough, shortness of breath and wheezing.   Gastrointestinal: Negative for abdominal distention, abdominal pain, constipation, diarrhea, nausea and vomiting.  Genitourinary: Negative for difficulty urinating, dysuria and urgency.  Musculoskeletal: Positive for arthralgias and gait problem.  Skin: Positive for color change.       Right hip surgical incisions  Neurological: Negative for dizziness, speech difficulty, weakness and headaches.  Psychiatric/Behavioral: Positive for confusion and dysphoric mood. Negative for agitation, behavioral problems, hallucinations and sleep disturbance. The patient is not nervous/anxious.     Immunization History  Administered Date(s) Administered   Influenza-Unspecified 05/29/2017   Pneumococcal Conjugate-13 06/21/2017   Tdap 06/21/2017   Pertinent  Health Maintenance Due  Topic Date Due   PNA vac Low Risk Adult (2 of 2 - PPSV23) 06/21/2018   INFLUENZA VACCINE  03/09/2019   DEXA SCAN  Completed   Fall Risk  04/27/2018 04/25/2017  Falls in the past year? No No   Functional Status Survey:    Vitals:   04/08/19 1627  BP: (!) 160/78  Pulse: 86  Resp: 20  Temp: 98.1 F (36.7 C)  SpO2: 96%  Weight: 149 lb (67.6 kg)  Height: 5\' 6"  (1.676 m)   Body mass index is 24.05 kg/m. Physical Exam Vitals signs and nursing note reviewed.  Constitutional:      General: She is not in acute distress.    Appearance: Normal appearance. She is not ill-appearing, toxic-appearing or diaphoretic.  HENT:     Head: Normocephalic and atraumatic.     Nose: Nose normal.     Mouth/Throat:     Mouth: Mucous membranes are moist.  Eyes:     Extraocular Movements: Extraocular movements intact.     Conjunctiva/sclera: Conjunctivae normal.     Pupils: Pupils are equal, round, and reactive to light.  Neck:     Musculoskeletal:  Normal range of motion and neck supple.  Cardiovascular:     Rate and Rhythm: Normal rate and regular rhythm.     Heart sounds: No murmur.  Pulmonary:     Breath sounds: No wheezing, rhonchi or rales.  Abdominal:     General: Bowel sounds are normal.     Palpations: Abdomen is soft.     Tenderness: There is no abdominal tenderness. There is no right CVA tenderness, left CVA tenderness, guarding or rebound.  Musculoskeletal:     Right lower leg: Edema present.     Left lower leg: Edema present.     Comments: Edema 1+ RLE, trace LLE  Skin:    General: Skin is warm and dry.     Findings: Bruising present.     Comments: Right hip surgical incisions intact, no s/s of peri wound cellulitis.   Neurological:     General: No focal deficit present.     Mental Status: She is alert. Mental status is at baseline.     Cranial Nerves: No cranial nerve deficit.  Motor: No weakness.     Coordination: Coordination normal.     Gait: Gait abnormal.     Comments: Oriented to self.   Psychiatric:        Mood and Affect: Mood normal.        Behavior: Behavior normal.     Labs reviewed: Recent Labs    04/04/19 0051 04/05/19 0213 04/06/19 0226  NA 139 138 137  K 3.7 4.5 4.0  CL 105 107 105  CO2 22 23 26   GLUCOSE 132* 148* 119*  BUN 13 13 18   CREATININE 0.83 0.83 0.78  CALCIUM 10.0 9.1 9.2   Recent Labs    08/16/18 10/26/18  AST 9* 13  ALT 7 8  ALKPHOS 73  --   PROT 5.9  --   ALBUMIN 3.4  --    Recent Labs    04/05/19 0213 04/06/19 0226 04/07/19 0324  WBC 11.4* 9.0 8.0  HGB 10.9* 9.4* 8.9*  HCT 34.7* 30.3* 29.0*  MCV 95.1 94.7 96.0  PLT 199 170 177   Lab Results  Component Value Date   TSH 1.94 08/16/2018   Lab Results  Component Value Date   HGBA1C 5.3 11/01/2016   No results found for: CHOL, HDL, LDLCALC, LDLDIRECT, TRIG, CHOLHDL  Significant Diagnostic Results in last 30 days:  Dg Chest 1 View  Result Date: 04/04/2019 CLINICAL DATA:  Fall EXAM: CHEST  1 VIEW  COMPARISON:  None. FINDINGS: Low lung volumes. Linear atelectasis or scarring at the left mid lung and left lung base. Right lung is clear. Normal heart size. No pneumothorax. IMPRESSION: Low lung volumes with atelectasis and or scarring in the left mid lung and left lung base Electronically Signed   By: Donavan Foil M.D.   On: 04/04/2019 03:38   Dg Thoracic Spine 2 View  Result Date: 04/04/2019 CLINICAL DATA:  Fall EXAM: THORACIC SPINE 2 VIEWS COMPARISON:  None. FINDINGS: Limited by positioning. Thoracic alignment grossly within normal limits. Vertebral body heights appear maintained. Diffuse osteophytosis of the thoracic spine. IMPRESSION: No definite acute osseous abnormality. Electronically Signed   By: Donavan Foil M.D.   On: 04/04/2019 03:45   Dg Lumbar Spine Complete  Result Date: 04/04/2019 CLINICAL DATA:  Fall EXAM: LUMBAR SPINE - COMPLETE 4+ VIEW COMPARISON:  None. FINDINGS: Transitional anatomy suspected with 4 non rib-bearing vertebra. First non rib-bearing lumbar vertebra will be designated L1. Left lateral positioning of L2 with respect to L3 on the frontal view. This is of uncertain chronicity. Suspected lucency about the right posterior rod and fixating screws at L3-L4. Vertebral body heights are grossly maintained. Prominent degenerative changes at L3-L4. Diffuse osteophytes of the lumbar spine. IMPRESSION: 1. Suspected transitional anatomy. First non rib-bearing lumbar vertebra is designated L1 for the purposes of reporting 2. Somewhat abrupt appearing left lateral displacement of L2 with respect to L3 on AP view of uncertain chronicity. There is posterior rod and fixating screws at L3-L4 with suspected lucency/loosening about the right pedicle screws, and possible partial dislodgement of the rods and fixating screws on the right side. No gross acute fracture is seen. Electronically Signed   By: Donavan Foil M.D.   On: 04/04/2019 03:43   Ct Head Wo Contrast  Result Date:  04/04/2019 CLINICAL DATA:  85 year old female with fall EXAM: CT HEAD WITHOUT CONTRAST CT CERVICAL SPINE WITHOUT CONTRAST TECHNIQUE: Multidetector CT imaging of the head and cervical spine was performed following the standard protocol without intravenous contrast. Multiplanar CT image reconstructions of the  cervical spine were also generated. COMPARISON:  None. FINDINGS: CT HEAD FINDINGS Brain: There is mild age-related atrophy and chronic microvascular ischemic changes. There is no acute intracranial. No mass effect or midline shift. No extra-axial fluid collection. Vascular: No hyperdense vessel or unexpected calcification. Skull: Normal. Negative for fracture or focal lesion. Sinuses/Orbits: No acute finding. Other: None CT CERVICAL SPINE FINDINGS Evaluation of this exam is limited due to motion artifact. Alignment: No acute subluxation. There is grade 1 C4-C5 anterolisthesis. Skull base and vertebrae: No definite acute fracture. C4 and C5 laminectomy changes. Soft tissues and spinal canal: No prevertebral fluid or swelling. No visible canal hematoma. Disc levels: Multilevel degenerative changes and disc disease with endplate irregularity and disc space narrowing. Multilevel facet arthropathy. Upper chest: Negative. Other: Bilateral carotid bulb calcified plaques. IMPRESSION: 1. No acute intracranial hemorrhage. 2. Mild age-related atrophy and chronic microvascular ischemic changes. 3. No acute/traumatic cervical spine pathology. Electronically Signed   By: Anner Crete M.D.   On: 04/04/2019 03:43   Ct Cervical Spine Wo Contrast  Result Date: 04/04/2019 CLINICAL DATA:  85 year old female with fall EXAM: CT HEAD WITHOUT CONTRAST CT CERVICAL SPINE WITHOUT CONTRAST TECHNIQUE: Multidetector CT imaging of the head and cervical spine was performed following the standard protocol without intravenous contrast. Multiplanar CT image reconstructions of the cervical spine were also generated. COMPARISON:  None.  FINDINGS: CT HEAD FINDINGS Brain: There is mild age-related atrophy and chronic microvascular ischemic changes. There is no acute intracranial. No mass effect or midline shift. No extra-axial fluid collection. Vascular: No hyperdense vessel or unexpected calcification. Skull: Normal. Negative for fracture or focal lesion. Sinuses/Orbits: No acute finding. Other: None CT CERVICAL SPINE FINDINGS Evaluation of this exam is limited due to motion artifact. Alignment: No acute subluxation. There is grade 1 C4-C5 anterolisthesis. Skull base and vertebrae: No definite acute fracture. C4 and C5 laminectomy changes. Soft tissues and spinal canal: No prevertebral fluid or swelling. No visible canal hematoma. Disc levels: Multilevel degenerative changes and disc disease with endplate irregularity and disc space narrowing. Multilevel facet arthropathy. Upper chest: Negative. Other: Bilateral carotid bulb calcified plaques. IMPRESSION: 1. No acute intracranial hemorrhage. 2. Mild age-related atrophy and chronic microvascular ischemic changes. 3. No acute/traumatic cervical spine pathology. Electronically Signed   By: Anner Crete M.D.   On: 04/04/2019 03:43   Pelvis Portable  Result Date: 04/04/2019 CLINICAL DATA:  Post right hip ORIF EXAM: PORTABLE PELVIS 1-2 VIEWS COMPARISON:  Same day radiographs and intraoperative fluoroscopy FINDINGS: Radiograph demonstrates the placement of a right femoral intramedullary rod with transcervical cancellous fixation screw. Alignment across the fracture line is near anatomic. There is increased retraction of the fracture fragment involving the lesser trochanter of the right proximal femur. Overlying soft tissue swelling is noted. The left femoral head remains normally located with mild left hip arthrosis. Remaining bones are unremarkable. Foley catheter in place. IMPRESSION: Right femoral ORIF without evidence of hardware complication. Increased retraction of fracture fragment involving  the right lesser trochanter Electronically Signed   By: Lovena Le M.D.   On: 04/04/2019 17:54   Dg Knee Right Port  Result Date: 04/04/2019 CLINICAL DATA:  Right knee pain after fall. EXAM: PORTABLE RIGHT KNEE - 1-2 VIEW COMPARISON:  None. FINDINGS: Status post right total knee arthroplasty. The right femoral and tibial components appear to be well situated. No joint effusion is noted. No fracture or dislocation is noted. No soft tissue abnormality is noted. IMPRESSION: Status post right total knee arthroplasty. No acute abnormality  seen in the right knee. Electronically Signed   By: Marijo Conception M.D.   On: 04/04/2019 08:50   Dg C-arm 1-60 Min-no Report  Result Date: 04/04/2019 CLINICAL DATA:  Intramedullary nail placement for right femoral fracture EXAM: OPERATIVE RIGHT HIP (WITH PELVIS IF PERFORMED) 4 VIEWS TECHNIQUE: Fluoroscopic spot image(s) were submitted for interpretation post-operatively. COMPARISON:  Radiographs April 04, 2019 FINDINGS: Initial frontal and frogleg lateral views demonstrate and intertrochanteric right femur fracture. Subsequent images demonstrate the placement a short stem right femoral intramedullary nail with a transcervical cancellous screw and a distal threaded secure screw within the proximal femoral diaphysis. Post fixation alignment is near anatomic. Expected postsurgical soft tissue changes are noted. IMPRESSION: Right femoral intramedullary nail placement with near anatomic alignment. Electronically Signed   By: Lovena Le M.D.   On: 04/04/2019 17:52   Dg Hip Operative Unilat W Or W/o Pelvis Right  Result Date: 04/04/2019 CLINICAL DATA:  Intramedullary nail placement for right femoral fracture EXAM: OPERATIVE RIGHT HIP (WITH PELVIS IF PERFORMED) 4 VIEWS TECHNIQUE: Fluoroscopic spot image(s) were submitted for interpretation post-operatively. COMPARISON:  Radiographs April 04, 2019 FINDINGS: Initial frontal and frogleg lateral views demonstrate and  intertrochanteric right femur fracture. Subsequent images demonstrate the placement a short stem right femoral intramedullary nail with a transcervical cancellous screw and a distal threaded secure screw within the proximal femoral diaphysis. Post fixation alignment is near anatomic. Expected postsurgical soft tissue changes are noted. IMPRESSION: Right femoral intramedullary nail placement with near anatomic alignment. Electronically Signed   By: Lovena Le M.D.   On: 04/04/2019 17:52   Dg Hip Unilat With Pelvis 2-3 Views Right  Result Date: 04/04/2019 CLINICAL DATA:  Fall with hip pain EXAM: DG HIP (WITH OR WITHOUT PELVIS) 2-3V RIGHT COMPARISON:  None. FINDINGS: Right SI joint is non widened. Pubic symphysis is intact. Acute comminuted right intertrochanteric fracture. Right femoral head projects in joint. Skin fold artifact versus nondisplaced fracture over the right inferior pubic ramus. IMPRESSION: 1. Acute comminuted right intertrochanteric fracture. 2. Skin fold artifact versus nondisplaced fracture over the right inferior pubic ramus Electronically Signed   By: Donavan Foil M.D.   On: 04/04/2019 03:47    Assessment/Plan Hip fracture (HCC) acute right hip fracture 2nd to a mechanical fall, s/p 04/04/19 IM nail intertrochanteric R. Lovenox 40mg  qd, f/u Ortho. Prn Norco 5/325mg  available to her.    HTN (hypertension) Mild elevated Sbp, not new, asymptomatic today, observe.   Depression Her mood is stable, continue Trazodone 25mg  qd, Duloxetine 30mg  qd, Depakote 125mg  bid.   Mixed Alzheimer's and vascular dementia ([place]) Continue SNF [facility] for safety, care assistance.   Gait abnormality Ambulates with walker prior to hip fx, gaol is to ambulate with walker.      Family/ staff Communication: plan of care reviewed with the patient and charge nurse.   Labs/tests ordered:    Time spend 35 minutes.

## 2019-04-09 ENCOUNTER — Encounter: Payer: Self-pay | Admitting: Nurse Practitioner

## 2019-04-09 ENCOUNTER — Non-Acute Institutional Stay (SKILLED_NURSING_FACILITY): Payer: Medicare Other | Admitting: Internal Medicine

## 2019-04-09 ENCOUNTER — Encounter: Payer: Self-pay | Admitting: Internal Medicine

## 2019-04-09 DIAGNOSIS — S72001D Fracture of unspecified part of neck of right femur, subsequent encounter for closed fracture with routine healing: Secondary | ICD-10-CM | POA: Diagnosis not present

## 2019-04-09 DIAGNOSIS — D500 Iron deficiency anemia secondary to blood loss (chronic): Secondary | ICD-10-CM | POA: Diagnosis not present

## 2019-04-09 DIAGNOSIS — S72001S Fracture of unspecified part of neck of right femur, sequela: Secondary | ICD-10-CM | POA: Diagnosis not present

## 2019-04-09 DIAGNOSIS — E039 Hypothyroidism, unspecified: Secondary | ICD-10-CM | POA: Diagnosis not present

## 2019-04-09 DIAGNOSIS — M6281 Muscle weakness (generalized): Secondary | ICD-10-CM | POA: Diagnosis not present

## 2019-04-09 DIAGNOSIS — F329 Major depressive disorder, single episode, unspecified: Secondary | ICD-10-CM

## 2019-04-09 DIAGNOSIS — R269 Unspecified abnormalities of gait and mobility: Secondary | ICD-10-CM | POA: Diagnosis not present

## 2019-04-09 DIAGNOSIS — G4700 Insomnia, unspecified: Secondary | ICD-10-CM | POA: Diagnosis not present

## 2019-04-09 DIAGNOSIS — M9701XA Periprosthetic fracture around internal prosthetic right hip joint, initial encounter: Secondary | ICD-10-CM | POA: Diagnosis not present

## 2019-04-09 DIAGNOSIS — D518 Other vitamin B12 deficiency anemias: Secondary | ICD-10-CM | POA: Diagnosis not present

## 2019-04-09 DIAGNOSIS — R609 Edema, unspecified: Secondary | ICD-10-CM | POA: Diagnosis not present

## 2019-04-09 DIAGNOSIS — I10 Essential (primary) hypertension: Secondary | ICD-10-CM

## 2019-04-09 DIAGNOSIS — R41841 Cognitive communication deficit: Secondary | ICD-10-CM | POA: Diagnosis not present

## 2019-04-09 DIAGNOSIS — F0150 Vascular dementia without behavioral disturbance: Secondary | ICD-10-CM | POA: Insufficient documentation

## 2019-04-09 DIAGNOSIS — Z7389 Other problems related to life management difficulty: Secondary | ICD-10-CM | POA: Diagnosis not present

## 2019-04-09 DIAGNOSIS — R2681 Unsteadiness on feet: Secondary | ICD-10-CM | POA: Diagnosis not present

## 2019-04-09 DIAGNOSIS — D72829 Elevated white blood cell count, unspecified: Secondary | ICD-10-CM | POA: Diagnosis not present

## 2019-04-09 DIAGNOSIS — J329 Chronic sinusitis, unspecified: Secondary | ICD-10-CM | POA: Diagnosis not present

## 2019-04-09 DIAGNOSIS — F339 Major depressive disorder, recurrent, unspecified: Secondary | ICD-10-CM | POA: Diagnosis not present

## 2019-04-09 DIAGNOSIS — F99 Mental disorder, not otherwise specified: Secondary | ICD-10-CM | POA: Diagnosis not present

## 2019-04-09 DIAGNOSIS — G309 Alzheimer's disease, unspecified: Secondary | ICD-10-CM | POA: Insufficient documentation

## 2019-04-09 DIAGNOSIS — M150 Primary generalized (osteo)arthritis: Secondary | ICD-10-CM | POA: Diagnosis not present

## 2019-04-09 DIAGNOSIS — E642 Sequelae of vitamin C deficiency: Secondary | ICD-10-CM | POA: Diagnosis not present

## 2019-04-09 DIAGNOSIS — M1990 Unspecified osteoarthritis, unspecified site: Secondary | ICD-10-CM | POA: Diagnosis not present

## 2019-04-09 DIAGNOSIS — S72141D Displaced intertrochanteric fracture of right femur, subsequent encounter for closed fracture with routine healing: Secondary | ICD-10-CM | POA: Diagnosis not present

## 2019-04-09 DIAGNOSIS — Z9181 History of falling: Secondary | ICD-10-CM | POA: Diagnosis not present

## 2019-04-09 DIAGNOSIS — E538 Deficiency of other specified B group vitamins: Secondary | ICD-10-CM

## 2019-04-09 DIAGNOSIS — F0280 Dementia in other diseases classified elsewhere without behavioral disturbance: Secondary | ICD-10-CM | POA: Diagnosis not present

## 2019-04-09 DIAGNOSIS — Z20828 Contact with and (suspected) exposure to other viral communicable diseases: Secondary | ICD-10-CM | POA: Diagnosis not present

## 2019-04-09 DIAGNOSIS — F5105 Insomnia due to other mental disorder: Secondary | ICD-10-CM | POA: Diagnosis not present

## 2019-04-09 DIAGNOSIS — E559 Vitamin D deficiency, unspecified: Secondary | ICD-10-CM | POA: Diagnosis not present

## 2019-04-09 DIAGNOSIS — G301 Alzheimer's disease with late onset: Secondary | ICD-10-CM | POA: Diagnosis not present

## 2019-04-09 DIAGNOSIS — G308 Other Alzheimer's disease: Secondary | ICD-10-CM | POA: Diagnosis not present

## 2019-04-09 DIAGNOSIS — W19XXXA Unspecified fall, initial encounter: Secondary | ICD-10-CM | POA: Diagnosis not present

## 2019-04-09 DIAGNOSIS — K5900 Constipation, unspecified: Secondary | ICD-10-CM | POA: Diagnosis not present

## 2019-04-09 NOTE — Assessment & Plan Note (Signed)
Continue SNF [facility] for safety, care assistance.  

## 2019-04-09 NOTE — Assessment & Plan Note (Signed)
Mild elevated Sbp, not new, asymptomatic today, observe.

## 2019-04-09 NOTE — Assessment & Plan Note (Signed)
Ambulates with walker prior to hip fx, gaol is to ambulate with walker.

## 2019-04-09 NOTE — Progress Notes (Signed)
Provider:  Veleta Miners  MD Location:  [facility] Room Number: 924[facility] Place of Service:  SNF ([phone])  PCP: [name], MD Patient Care Team: [name], MD as PCP - General (Internal Medicine) Mast, Man X, NP as Nurse Practitioner (Internal Medicine) [name], MD (Internal Medicine)  Extended Emergency Contact Information Primary Emergency Contact: [facility] Address: [address]          [place], [place] [address] [name] of [place] Phone: [phone] Relation: Son  Code Status: DNR Goals of Care: Advanced Directive information Advanced Directives 04/09/2019  Does Patient Have a Medical Advance Directive? Yes  Type of Advance Directive Out of facility DNR (pink MOST or yellow form)  Does patient want to make changes to medical advance directive? No - Patient declined  Copy of [place] in Chart? -  Pre-existing out of facility DNR order (yellow form or pink MOST form) Yellow form placed in chart (order not valid for inpatient use)      Chief Complaint  Patient presents with   Readmit To SNF    HPI: Patient is a 85 y.o. female seen today for Readmission to SNF Patient was admitted from 8/26-8/30 for right hip fracture secondary to mechanical fall Underwent intramedullary fixation on 8/27 Patient has h/o Hypertension, Hypothyroidism, Alzheimer's Dementia with behavior issues, Depression, B12 def  Patient lives in the memory unit for her dementia.  She does not remember anything from her fracture and surgery.  Apparently ED notes patient fell when she was getting ready to go to her bed.  She did not lose any consciousness. In the ED she was found to have right hip fracture.  She underwent intramedullary fixation by Dr. Feliciana Rossetti on 8/27 Her postop course was uncomplicated.  She is now back in SNF for therapy Per nurses patient is doing well.  Her only problem is that she forgets and tries to get without any  assist.  Patient denied severe pain in her leg ,just mild pain relieved with Tylenol. Did not have any other acute issues.   Past Medical History:  Diagnosis Date   Alzheimer disease ([facility]) 10/27/2016   Arthritis    Confusion 04/04/2019   Depression, recurrent ([place]) 10/27/2016   HTN (hypertension) 10/27/2016   Hypertension    Hypertensive kidney disease with CKD (chronic kidney disease) stage V ([facility]) 11/02/2016   11/02/16 Na 136, K 4.7, Bun 30, creat 1.52, TSH 0.86, wbc 6.7, Hgb 11.3, plt 264   Hypothyroidism 10/27/2016   Osteoarthritis 10/27/2016   Thyroid disease    Past Surgical History:  Procedure Laterality Date   ABDOMINAL HYSTERECTOMY  1986   INTRAMEDULLARY (IM) NAIL INTERTROCHANTERIC Right 04/04/2019   Procedure: INTRAMEDULLARY (IM) NAIL INTERTROCHANTRIC;  Surgeon: Rod Can, MD;  Location: [facility];  Service: Orthopedics;  Laterality: Right;   KNEE ARTHROSCOPY  2015/2017   Dr. Gustavus Bryant   SPINE SURGERY  2009, 2011   Dr.Mark Nicoletta Dress    reports that she has never smoked. She has never used smokeless tobacco. She reports previous alcohol use. She reports that she does not use drugs. Social History   Socioeconomic History   Marital status: Unknown    Spouse name: Not on file   Number of children: Not on file   Years of education: Not on file   Highest education level: Not on file  Occupational History   Not on file  Social Needs   Financial resource strain: Not hard at all  Food insecurity    Worry: Never true    Inability: Never true   Transportation needs    Medical: No    Non-medical: No  Tobacco Use   Smoking status: Never Smoker   Smokeless tobacco: Never Used  Substance and Sexual Activity   Alcohol use: Not Currently   Drug use: Never   Sexual activity: Not Currently  Lifestyle   Physical activity    Days per week: 0 days    Minutes per session: 0 min   Stress: Not at all  Relationships   Social connections    Talks on  phone: More than three times a week    Gets together: More than three times a week    Attends religious service: Never    Active member of club or organization: No    Attends meetings of clubs or organizations: Never    Relationship status: Widowed   Intimate partner violence    Fear of current or ex partner: No    Emotionally abused: No    Physically abused: No    Forced sexual activity: No  Other Topics Concern   Not on file  Social History Narrative   ** Merged History Encounter **       Admitted to [facility] 10/21/16 Widowed Never smoked Alcohol none POA/DNR      Functional Status Survey:    Family History  Problem Relation Age of Onset   Cancer Mother     Health Maintenance  Topic Date Due   PNA vac Low Risk Adult (2 of 2 - PPSV23) 06/21/2018   INFLUENZA VACCINE  03/09/2019   TETANUS/TDAP  06/22/2027   DEXA SCAN  Completed    No Known Allergies  Outpatient Encounter Medications as of 04/09/2019  Medication Sig   cholecalciferol (VITAMIN D3) 25 MCG (1000 UT) tablet Take 1,000 Units by mouth daily.   divalproex (DEPAKOTE SPRINKLE) 125 MG capsule Take 125 mg by mouth 2 (two) times daily.   docusate sodium (COLACE) 100 MG capsule Take 1 capsule (100 mg total) by mouth 2 (two) times daily.   DULoxetine (CYMBALTA) 30 MG capsule Take 30 mg by mouth daily.   enoxaparin (LOVENOX) 40 MG/0.4ML injection Inject 40 mg into the skin daily.   Melatonin 5 MG TABS Take 5 mg by mouth at bedtime as needed (sleep).   traZODone (DESYREL) 50 MG tablet Take 25 mg by mouth at bedtime.   vitamin B-12 (CYANOCOBALAMIN) 1000 MCG tablet Take 1,000 mcg by mouth daily.   vitamin C (ASCORBIC ACID) 500 MG tablet Take 500 mg by mouth daily.   [DISCONTINUED] HYDROcodone-acetaminophen (NORCO/VICODIN) 5-325 MG tablet Take 1 tablet by mouth as needed for moderate pain.   [DISCONTINUED] levothyroxine (SYNTHROID) 25 MCG tablet Take 25 mcg by mouth daily before  breakfast.   [DISCONTINUED] HYDROcodone-acetaminophen (NORCO/VICODIN) 5-325 MG tablet Take 1 tablet by mouth every 4 (four) hours as needed for moderate pain.   No facility-administered encounter medications on file as of 04/09/2019.     Review of Systems  Unable to perform ROS: Dementia    Vitals:   04/09/19 0928  BP: (!) 150/80  Pulse: 80  Resp: 20  Temp: 98.9 F (37.2 C)  SpO2: 95%  Weight: 152 lb 3.2 oz (69 kg)  Height: 4\' 11"  (1.499 m)   Body mass index is 30.74 kg/m. Physical Exam Vitals signs reviewed.  Constitutional:      Appearance: Normal appearance. She is normal weight.  HENT:  Head: Normocephalic.     Nose: Nose normal.     Mouth/Throat:     Mouth: Mucous membranes are moist.     Pharynx: Oropharynx is clear.  Eyes:     Pupils: Pupils are equal, round, and reactive to light.  Neck:     Musculoskeletal: Neck supple.  Cardiovascular:     Rate and Rhythm: Normal rate and regular rhythm.     Pulses: Normal pulses.  Pulmonary:     Effort: Pulmonary effort is normal. No respiratory distress.     Breath sounds: Normal breath sounds. No wheezing or rales.  Musculoskeletal:        General: No swelling.  Skin:    General: Skin is warm and dry.  Neurological:     Mental Status: She is alert.     Comments: Not Oriented. Unable to lift her Right Leg  Psychiatric:        Mood and Affect: Mood normal.        Thought Content: Thought content normal.        Judgment: Judgment normal.     Labs reviewed: Basic Metabolic Panel: Recent Labs    04/04/19 0051 04/05/19 0213 04/06/19 0226  NA 139 138 137  K 3.7 4.5 4.0  CL 105 107 105  CO2 22 23 26   GLUCOSE 132* 148* 119*  BUN 13 13 18   CREATININE 0.83 0.83 0.78  CALCIUM 10.0 9.1 9.2   Liver Function Tests: Recent Labs    08/16/18 10/26/18  AST 9* 13  ALT 7 8  ALKPHOS 73  --   PROT 5.9  --   ALBUMIN 3.4  --    No results for input(s): LIPASE, AMYLASE in the last 8760 hours. No results for  input(s): AMMONIA in the last 8760 hours. CBC: Recent Labs    04/05/19 0213 04/06/19 0226 04/07/19 0324  WBC 11.4* 9.0 8.0  HGB 10.9* 9.4* 8.9*  HCT 34.7* 30.3* 29.0*  MCV 95.1 94.7 96.0  PLT 199 170 177   Cardiac Enzymes: No results for input(s): CKTOTAL, CKMB, CKMBINDEX, TROPONINI in the last 8760 hours. BNP: Invalid input(s): POCBNP Lab Results  Component Value Date   HGBA1C 5.3 11/01/2016   Lab Results  Component Value Date   TSH 1.94 08/16/2018   Lab Results  Component Value Date   [id number] 10/19/2017   No results found for: FOLATE No results found for: IRON, TIBC, FERRITIN  Imaging and Procedures obtained prior to SNF admission: Dg Chest 1 View  Result Date: 04/04/2019 CLINICAL DATA:  Fall EXAM: CHEST  1 VIEW COMPARISON:  None. FINDINGS: Low lung volumes. Linear atelectasis or scarring at the left mid lung and left lung base. Right lung is clear. Normal heart size. No pneumothorax. IMPRESSION: Low lung volumes with atelectasis and or scarring in the left mid lung and left lung base Electronically Signed   By: Donavan Foil M.D.   On: 04/04/2019 03:38   Dg Thoracic Spine 2 View  Result Date: 04/04/2019 CLINICAL DATA:  Fall EXAM: THORACIC SPINE 2 VIEWS COMPARISON:  None. FINDINGS: Limited by positioning. Thoracic alignment grossly within normal limits. Vertebral body heights appear maintained. Diffuse osteophytosis of the thoracic spine. IMPRESSION: No definite acute osseous abnormality. Electronically Signed   By: Donavan Foil M.D.   On: 04/04/2019 03:45   Dg Lumbar Spine Complete  Result Date: 04/04/2019 CLINICAL DATA:  Fall EXAM: LUMBAR SPINE - COMPLETE 4+ VIEW COMPARISON:  None. FINDINGS: Transitional anatomy suspected with 4 non rib-bearing  vertebra. First non rib-bearing lumbar vertebra will be designated L1. Left lateral positioning of L2 with respect to L3 on the frontal view. This is of uncertain chronicity. Suspected lucency about the right posterior  rod and fixating screws at L3-L4. Vertebral body heights are grossly maintained. Prominent degenerative changes at L3-L4. Diffuse osteophytes of the lumbar spine. IMPRESSION: 1. Suspected transitional anatomy. First non rib-bearing lumbar vertebra is designated L1 for the purposes of reporting 2. Somewhat abrupt appearing left lateral displacement of L2 with respect to L3 on AP view of uncertain chronicity. There is posterior rod and fixating screws at L3-L4 with suspected lucency/loosening about the right pedicle screws, and possible partial dislodgement of the rods and fixating screws on the right side. No gross acute fracture is seen. Electronically Signed   By: Donavan Foil M.D.   On: 04/04/2019 03:43   Ct Head Wo Contrast  Result Date: 04/04/2019 CLINICAL DATA:  85 year old female with fall EXAM: CT HEAD WITHOUT CONTRAST CT CERVICAL SPINE WITHOUT CONTRAST TECHNIQUE: Multidetector CT imaging of the head and cervical spine was performed following the standard protocol without intravenous contrast. Multiplanar CT image reconstructions of the cervical spine were also generated. COMPARISON:  None. FINDINGS: CT HEAD FINDINGS Brain: There is mild age-related atrophy and chronic microvascular ischemic changes. There is no acute intracranial. No mass effect or midline shift. No extra-axial fluid collection. Vascular: No hyperdense vessel or unexpected calcification. Skull: Normal. Negative for fracture or focal lesion. Sinuses/Orbits: No acute finding. Other: None CT CERVICAL SPINE FINDINGS Evaluation of this exam is limited due to motion artifact. Alignment: No acute subluxation. There is grade 1 C4-C5 anterolisthesis. Skull base and vertebrae: No definite acute fracture. C4 and C5 laminectomy changes. Soft tissues and spinal canal: No prevertebral fluid or swelling. No visible canal hematoma. Disc levels: Multilevel degenerative changes and disc disease with endplate irregularity and disc space narrowing.  Multilevel facet arthropathy. Upper chest: Negative. Other: Bilateral carotid bulb calcified plaques. IMPRESSION: 1. No acute intracranial hemorrhage. 2. Mild age-related atrophy and chronic microvascular ischemic changes. 3. No acute/traumatic cervical spine pathology. Electronically Signed   By: Anner Crete M.D.   On: 04/04/2019 03:43   Ct Cervical Spine Wo Contrast  Result Date: 04/04/2019 CLINICAL DATA:  86 year old female with fall EXAM: CT HEAD WITHOUT CONTRAST CT CERVICAL SPINE WITHOUT CONTRAST TECHNIQUE: Multidetector CT imaging of the head and cervical spine was performed following the standard protocol without intravenous contrast. Multiplanar CT image reconstructions of the cervical spine were also generated. COMPARISON:  None. FINDINGS: CT HEAD FINDINGS Brain: There is mild age-related atrophy and chronic microvascular ischemic changes. There is no acute intracranial. No mass effect or midline shift. No extra-axial fluid collection. Vascular: No hyperdense vessel or unexpected calcification. Skull: Normal. Negative for fracture or focal lesion. Sinuses/Orbits: No acute finding. Other: None CT CERVICAL SPINE FINDINGS Evaluation of this exam is limited due to motion artifact. Alignment: No acute subluxation. There is grade 1 C4-C5 anterolisthesis. Skull base and vertebrae: No definite acute fracture. C4 and C5 laminectomy changes. Soft tissues and spinal canal: No prevertebral fluid or swelling. No visible canal hematoma. Disc levels: Multilevel degenerative changes and disc disease with endplate irregularity and disc space narrowing. Multilevel facet arthropathy. Upper chest: Negative. Other: Bilateral carotid bulb calcified plaques. IMPRESSION: 1. No acute intracranial hemorrhage. 2. Mild age-related atrophy and chronic microvascular ischemic changes. 3. No acute/traumatic cervical spine pathology. Electronically Signed   By: Anner Crete M.D.   On: 04/04/2019 03:43   Pelvis  Portable  Result Date: 04/04/2019 CLINICAL DATA:  Post right hip ORIF EXAM: PORTABLE PELVIS 1-2 VIEWS COMPARISON:  Same day radiographs and intraoperative fluoroscopy FINDINGS: Radiograph demonstrates the placement of a right femoral intramedullary rod with transcervical cancellous fixation screw. Alignment across the fracture line is near anatomic. There is increased retraction of the fracture fragment involving the lesser trochanter of the right proximal femur. Overlying soft tissue swelling is noted. The left femoral head remains normally located with mild left hip arthrosis. Remaining bones are unremarkable. Foley catheter in place. IMPRESSION: Right femoral ORIF without evidence of hardware complication. Increased retraction of fracture fragment involving the right lesser trochanter Electronically Signed   By: Lovena Le M.D.   On: 04/04/2019 17:54   Dg Knee Right Port  Result Date: 04/04/2019 CLINICAL DATA:  Right knee pain after fall. EXAM: PORTABLE RIGHT KNEE - 1-2 VIEW COMPARISON:  None. FINDINGS: Status post right total knee arthroplasty. The right femoral and tibial components appear to be well situated. No joint effusion is noted. No fracture or dislocation is noted. No soft tissue abnormality is noted. IMPRESSION: Status post right total knee arthroplasty. No acute abnormality seen in the right knee. Electronically Signed   By: Marijo Conception M.D.   On: 04/04/2019 08:50   Dg C-arm 1-60 Min-no Report  Result Date: 04/04/2019 CLINICAL DATA:  Intramedullary nail placement for right femoral fracture EXAM: OPERATIVE RIGHT HIP (WITH PELVIS IF PERFORMED) 4 VIEWS TECHNIQUE: Fluoroscopic spot image(s) were submitted for interpretation post-operatively. COMPARISON:  Radiographs April 04, 2019 FINDINGS: Initial frontal and frogleg lateral views demonstrate and intertrochanteric right femur fracture. Subsequent images demonstrate the placement a short stem right femoral intramedullary nail with a  transcervical cancellous screw and a distal threaded secure screw within the proximal femoral diaphysis. Post fixation alignment is near anatomic. Expected postsurgical soft tissue changes are noted. IMPRESSION: Right femoral intramedullary nail placement with near anatomic alignment. Electronically Signed   By: Lovena Le M.D.   On: 04/04/2019 17:52   Dg Hip Operative Unilat W Or W/o Pelvis Right  Result Date: 04/04/2019 CLINICAL DATA:  Intramedullary nail placement for right femoral fracture EXAM: OPERATIVE RIGHT HIP (WITH PELVIS IF PERFORMED) 4 VIEWS TECHNIQUE: Fluoroscopic spot image(s) were submitted for interpretation post-operatively. COMPARISON:  Radiographs April 04, 2019 FINDINGS: Initial frontal and frogleg lateral views demonstrate and intertrochanteric right femur fracture. Subsequent images demonstrate the placement a short stem right femoral intramedullary nail with a transcervical cancellous screw and a distal threaded secure screw within the proximal femoral diaphysis. Post fixation alignment is near anatomic. Expected postsurgical soft tissue changes are noted. IMPRESSION: Right femoral intramedullary nail placement with near anatomic alignment. Electronically Signed   By: Lovena Le M.D.   On: 04/04/2019 17:52   Dg Hip Unilat With Pelvis 2-3 Views Right  Result Date: 04/04/2019 CLINICAL DATA:  Fall with hip pain EXAM: DG HIP (WITH OR WITHOUT PELVIS) 2-3V RIGHT COMPARISON:  None. FINDINGS: Right SI joint is non widened. Pubic symphysis is intact. Acute comminuted right intertrochanteric fracture. Right femoral head projects in joint. Skin fold artifact versus nondisplaced fracture over the right inferior pubic ramus. IMPRESSION: 1. Acute comminuted right intertrochanteric fracture. 2. Skin fold artifact versus nondisplaced fracture over the right inferior pubic ramus Electronically Signed   By: Donavan Foil M.D.   On: 04/04/2019 03:47    Assessment/Plan S/P Right Hip nailing Pain  seems to be controlled on Tylenol Patient already getting up and transferring herself Needs Constant Supervision On Lovenox  Follow up with Ortho WBAT Essential hypertension On Clonidine Patch as she refuses Meds sometimes Was discontinued in the hospital BP running high in facility Will start her on Norvasc 2.5 mg QD  Acquired hypothyroidism TSH Normal in 01/20 Continue same dose  Late onset Alzheimer's disease with behavioral disturbance On Depakote and Trazodone She continues ot be high Fall risk   Major depression, chronic Continue on Cymbalta  B12 deficiency On B12 supplement Anemia Start on iron Family/ staff Communication:   Labs/tests ordered: Repeat CBC and CMP  Total time spent in this patient care encounter was  45_  minutes; greater than 50% of the visit spent counseling patient and staff, reviewing records , Labs and coordinating care for problems addressed at this encounter.

## 2019-04-09 NOTE — Assessment & Plan Note (Signed)
acute right hip fracture 2nd to a mechanical fall, s/p 04/04/19 IM nail intertrochanteric R. Lovenox 40mg  qd, f/u Ortho. Prn Norco 5/325mg  available to her.

## 2019-04-09 NOTE — Assessment & Plan Note (Signed)
Her mood is stable, continue Trazodone 25mg  qd, Duloxetine 30mg  qd, Depakote 125mg  bid.

## 2019-04-10 ENCOUNTER — Encounter: Payer: Self-pay | Admitting: Nurse Practitioner

## 2019-04-11 LAB — BASIC METABOLIC PANEL
BUN: 10 (ref 4–21)
Creatinine: 0.8 (ref 0.5–1.1)
Glucose: 109
Potassium: 4.2 (ref 3.4–5.3)
Sodium: 140 (ref 137–147)

## 2019-04-11 LAB — CBC AND DIFFERENTIAL
HCT: 30 — AB (ref 36–46)
Hemoglobin: 9.7 — AB (ref 12.0–16.0)
Platelets: 354 (ref 150–399)
WBC: 7.4

## 2019-04-11 LAB — TSH: TSH: 2.43 (ref 0.41–5.90)

## 2019-04-16 ENCOUNTER — Encounter: Payer: Self-pay | Admitting: Nurse Practitioner

## 2019-04-16 ENCOUNTER — Non-Acute Institutional Stay (SKILLED_NURSING_FACILITY): Payer: Medicare Other | Admitting: Nurse Practitioner

## 2019-04-16 DIAGNOSIS — R609 Edema, unspecified: Secondary | ICD-10-CM

## 2019-04-16 DIAGNOSIS — I10 Essential (primary) hypertension: Secondary | ICD-10-CM

## 2019-04-16 DIAGNOSIS — R269 Unspecified abnormalities of gait and mobility: Secondary | ICD-10-CM | POA: Diagnosis not present

## 2019-04-16 DIAGNOSIS — F0280 Dementia in other diseases classified elsewhere without behavioral disturbance: Secondary | ICD-10-CM

## 2019-04-16 DIAGNOSIS — G308 Other Alzheimer's disease: Secondary | ICD-10-CM

## 2019-04-16 DIAGNOSIS — D500 Iron deficiency anemia secondary to blood loss (chronic): Secondary | ICD-10-CM

## 2019-04-16 DIAGNOSIS — W19XXXA Unspecified fall, initial encounter: Secondary | ICD-10-CM | POA: Diagnosis not present

## 2019-04-16 NOTE — Progress Notes (Signed)
Location:  [facility] Room Number: 04/07/2019 Place of Service:  SNF (31) Provider: Marlana Latus  NP  Haley Dad, MD  Patient Care Team: Haley Dad, MD as PCP - General (Internal Medicine) Haley Lewellyn X, NP as Nurse Practitioner (Internal Medicine) Haley Dad, MD (Internal Medicine)  Extended Emergency Contact Information Primary Emergency Contact: Haley Cohen Address: [address]          [place], [place] [address] Haley Cohen of [place] Phone: [phone] Relation: Son  Code Status:  DNR Goals of care: Advanced Directive information Advanced Directives 04/18/2019  Does Patient Have a Medical Advance Directive? Yes  Type of Advance Directive Out of facility DNR (pink MOST or yellow form);Healthcare Power of Attorney  Does patient want to make changes to medical advance directive? No - Patient declined  Copy of [place] in Chart? Yes - validated most recent copy scanned in chart (See row information)  Pre-existing out of facility DNR order (yellow form or pink MOST form) Yellow form placed in chart (order not valid for inpatient use)     Chief Complaint  Patient presents with   Acute Visit    C/o - Fall    HPI:  Pt is a 85 y.o. female seen today for an acute visit for reported fall 04/13/19, the patient was found sitting in the floor of her room, between w/c and doorway, R hip pain is about as usual. HPI was provided with assistance of staff. Prn Norco 5/325mg  q4h available to her. S/p R hip ORIF, on Lovenox 40mg  inj qd. Post op anemia, Hgb 9.7 04/11/19   Past Medical History:  Diagnosis Date   Alzheimer disease ([place]) 10/27/2016   Arthritis    Confusion 04/04/2019   Depression, recurrent ([place]) 10/27/2016   HTN (hypertension) 10/27/2016   Hypertension    Hypertensive kidney disease with CKD (chronic kidney disease) stage V (H[facility]) 11/02/2016   11/02/16 Na 136, K 4.7, Bun 30, creat 1.52, TSH 0.86, wbc 6.7,  Hgb 11.3, plt 264   Hypothyroidism 10/27/2016   Osteoarthritis 10/27/2016   Thyroid disease    Past Surgical History:  Procedure Laterality Date   ABDOMINAL HYSTERECTOMY  1986   INTRAMEDULLARY (IM) NAIL INTERTROCHANTERIC Right 04/04/2019   Procedure: INTRAMEDULLARY (IM) NAIL INTERTROCHANTRIC;  Surgeon: Haley Can, MD;  Location: [facility];  Service: Orthopedics;  Laterality: Right;   KNEE ARTHROSCOPY  2015/2017   Dr. Gustavus Cohen   SPINE SURGERY  2009, 2011   Dr.Mark Nicoletta Cohen    No Known Allergies  Outpatient Encounter Medications as of 04/16/2019  Medication Sig   cholecalciferol (VITAMIN D3) 25 MCG (1000 UT) tablet Take 1,000 Units by mouth daily.   divalproex (DEPAKOTE SPRINKLE) 125 MG capsule Take 125 mg by mouth 2 (two) times daily.   docusate sodium (COLACE) 100 MG capsule Take 1 capsule (100 mg total) by mouth 2 (two) times daily.   DULoxetine (CYMBALTA) 30 MG capsule Take 30 mg by mouth daily.   enoxaparin (LOVENOX) 40 MG/0.4ML injection Inject 40 mg into the skin daily.   levothyroxine (SYNTHROID, LEVOTHROID) 25 MCG tablet Take 25 mcg by mouth daily before breakfast.   Melatonin 5 MG TABS Take 5 mg by mouth at bedtime as needed (sleep).   traZODone (DESYREL) 50 MG tablet Take 25 mg by mouth at bedtime.   vitamin B-12 (CYANOCOBALAMIN) 1000 MCG tablet Take 1,000 mcg by mouth daily.   vitamin C (ASCORBIC ACID) 500 MG tablet Take 500 mg by  mouth daily.   [DISCONTINUED] HYDROcodone-acetaminophen (NORCO/VICODIN) 5-325 MG tablet Take 1 tablet by mouth as needed for moderate pain.   [DISCONTINUED] vitamin C (ASCORBIC ACID) 500 MG tablet Take 500 mg by mouth daily.   No facility-administered encounter medications on file as of 04/16/2019.    ROS was provided with assistance of staff.  Review of Systems  Constitutional: Positive for fatigue. Negative for activity change, appetite change, chills, diaphoresis and fever.  HENT: Positive for hearing loss. Negative for congestion  and voice change.   Respiratory: Negative for cough and shortness of breath.   Cardiovascular: Positive for leg swelling. Negative for chest pain and palpitations.  Gastrointestinal: Negative for abdominal distention, abdominal pain, constipation, diarrhea, nausea and vomiting.  Genitourinary: Negative for difficulty urinating, dysuria and urgency.  Musculoskeletal: Positive for arthralgias and gait problem.  Skin: Negative for color change and pallor.  Neurological: Negative for dizziness, speech difficulty, weakness and headaches.       Dementia  Psychiatric/Behavioral: Negative for agitation, behavioral problems, hallucinations and sleep disturbance. The patient is not nervous/anxious.     Immunization History  Administered Date(s) Administered   Influenza-Unspecified 05/29/2017   Pneumococcal Conjugate-13 06/21/2017   Tdap 06/21/2017   Pertinent  Health Maintenance Due  Topic Date Due   PNA vac Low Risk Adult (2 of 2 - PPSV23) 06/21/2018   INFLUENZA VACCINE  03/09/2019   DEXA SCAN  Completed   Fall Risk  04/27/2018 04/25/2017  Falls in the past year? No No   Functional Status Survey:    Vitals:   04/16/19 1241  BP: 130/64  Pulse: 82  Resp: 20  Temp: 97.8 F (36.6 C)  SpO2: 95%  Weight: 154 lb 3.2 oz (69.9 kg)  Height: 4\' 11"  (1.499 m)   Body mass index is 31.14 kg/m. Physical Exam Vitals signs and nursing note reviewed.  Constitutional:      General: She is not in acute distress.    Appearance: Normal appearance. She is not ill-appearing, toxic-appearing or diaphoretic.  HENT:     Head: Normocephalic and atraumatic.     Nose: Nose normal.     Mouth/Throat:     Mouth: Mucous membranes are moist.  Eyes:     Extraocular Movements: Extraocular movements intact.     Conjunctiva/sclera: Conjunctivae normal.     Pupils: Pupils are equal, round, and reactive to light.  Neck:     Musculoskeletal: Normal range of motion and neck supple.  Cardiovascular:      Rate and Rhythm: Normal rate and regular rhythm.     Heart sounds: No murmur.  Pulmonary:     Breath sounds: No wheezing, rhonchi or rales.  Chest:     Chest wall: No tenderness.  Abdominal:     General: Bowel sounds are normal.     Palpations: Abdomen is soft.     Tenderness: There is no abdominal tenderness. There is no right CVA tenderness, left CVA tenderness, guarding or rebound.  Musculoskeletal:        General: Tenderness present.     Right lower leg: Edema present.     Left lower leg: Edema present.     Comments: 2+ edema BLE, R>L. Right hip pain related to movement or weight bearing.   Skin:    General: Skin is warm and dry.  Neurological:     General: No focal deficit present.     Mental Status: She is alert. Mental status is at baseline.     Motor: No weakness.  Coordination: Coordination normal.     Gait: Gait abnormal.     Comments: Oriented to self.   Psychiatric:        Mood and Affect: Mood normal.        Behavior: Behavior normal.     Labs reviewed: Recent Labs    04/04/19 0051 04/05/19 0213 04/06/19 0226 04/11/19  NA 139 138 137 140  K 3.7 4.5 4.0 4.2  CL 105 107 105  --   CO2 22 23 26   --   GLUCOSE 132* 148* 119*  --   BUN 13 13 18 10   CREATININE 0.83 0.83 0.78 0.8  CALCIUM 10.0 9.1 9.2  --    Recent Labs    08/16/18 10/26/18  AST 9* 13  ALT 7 8  ALKPHOS 73  --   PROT 5.9  --   ALBUMIN 3.4  --    Recent Labs    04/05/19 0213 04/06/19 0226 04/07/19 0324 04/11/19  WBC 11.4* 9.0 8.0 7.4  HGB 10.9* 9.4* 8.9* 9.7*  HCT 34.7* 30.3* 29.0* 30*  MCV 95.1 94.7 96.0  --   PLT 199 170 177 354   Lab Results  Component Value Date   TSH 2.43 04/11/2019   Lab Results  Component Value Date   HGBA1C 5.3 11/01/2016   No results found for: CHOL, HDL, LDLCALC, LDLDIRECT, TRIG, CHOLHDL  Significant Diagnostic Results in last 30 days:  Dg Chest 1 View  Result Date: 04/04/2019 CLINICAL DATA:  Fall EXAM: CHEST  1 VIEW COMPARISON:  None.  FINDINGS: Low lung volumes. Linear atelectasis or scarring at the left mid lung and left lung base. Right lung is clear. Normal heart size. No pneumothorax. IMPRESSION: Low lung volumes with atelectasis and or scarring in the left mid lung and left lung base Electronically Signed   By: Donavan Foil M.D.   On: 04/04/2019 03:38   Dg Thoracic Spine 2 View  Result Date: 04/04/2019 CLINICAL DATA:  Fall EXAM: THORACIC SPINE 2 VIEWS COMPARISON:  None. FINDINGS: Limited by positioning. Thoracic alignment grossly within normal limits. Vertebral body heights appear maintained. Diffuse osteophytosis of the thoracic spine. IMPRESSION: No definite acute osseous abnormality. Electronically Signed   By: Donavan Foil M.D.   On: 04/04/2019 03:45   Dg Lumbar Spine Complete  Result Date: 04/04/2019 CLINICAL DATA:  Fall EXAM: LUMBAR SPINE - COMPLETE 4+ VIEW COMPARISON:  None. FINDINGS: Transitional anatomy suspected with 4 non rib-bearing vertebra. First non rib-bearing lumbar vertebra will be designated L1. Left lateral positioning of L2 with respect to L3 on the frontal view. This is of uncertain chronicity. Suspected lucency about the right posterior Haley and fixating screws at L3-L4. Vertebral body heights are grossly maintained. Prominent degenerative changes at L3-L4. Diffuse osteophytes of the lumbar spine. IMPRESSION: 1. Suspected transitional anatomy. First non rib-bearing lumbar vertebra is designated L1 for the purposes of reporting 2. Somewhat abrupt appearing left lateral displacement of L2 with respect to L3 on AP view of uncertain chronicity. There is posterior Haley and fixating screws at L3-L4 with suspected lucency/loosening about the right pedicle screws, and possible partial dislodgement of the rods and fixating screws on the right side. No gross acute fracture is seen. Electronically Signed   By: Donavan Foil M.D.   On: 04/04/2019 03:43   Ct Head Wo Contrast  Result Date: 04/04/2019 CLINICAL DATA:   85 year old female with fall EXAM: CT HEAD WITHOUT CONTRAST CT CERVICAL SPINE WITHOUT CONTRAST TECHNIQUE: Multidetector CT imaging of the head  and cervical spine was performed following the standard protocol without intravenous contrast. Multiplanar CT image reconstructions of the cervical spine were also generated. COMPARISON:  None. FINDINGS: CT HEAD FINDINGS Brain: There is mild age-related atrophy and chronic microvascular ischemic changes. There is no acute intracranial. No mass effect or midline shift. No extra-axial fluid collection. Vascular: No hyperdense vessel or unexpected calcification. Skull: Normal. Negative for fracture or focal lesion. Sinuses/Orbits: No acute finding. Other: None CT CERVICAL SPINE FINDINGS Evaluation of this exam is limited due to motion artifact. Alignment: No acute subluxation. There is grade 1 C4-C5 anterolisthesis. Skull base and vertebrae: No definite acute fracture. C4 and C5 laminectomy changes. Soft tissues and spinal canal: No prevertebral fluid or swelling. No visible canal hematoma. Disc levels: Multilevel degenerative changes and disc disease with endplate irregularity and disc space narrowing. Multilevel facet arthropathy. Upper chest: Negative. Other: Bilateral carotid bulb calcified plaques. IMPRESSION: 1. No acute intracranial hemorrhage. 2. Mild age-related atrophy and chronic microvascular ischemic changes. 3. No acute/traumatic cervical spine pathology. Electronically Signed   By: Anner Crete M.D.   On: 04/04/2019 03:43   Ct Cervical Spine Wo Contrast  Result Date: 04/04/2019 CLINICAL DATA:  85 year old female with fall EXAM: CT HEAD WITHOUT CONTRAST CT CERVICAL SPINE WITHOUT CONTRAST TECHNIQUE: Multidetector CT imaging of the head and cervical spine was performed following the standard protocol without intravenous contrast. Multiplanar CT image reconstructions of the cervical spine were also generated. COMPARISON:  None. FINDINGS: CT HEAD FINDINGS  Brain: There is mild age-related atrophy and chronic microvascular ischemic changes. There is no acute intracranial. No mass effect or midline shift. No extra-axial fluid collection. Vascular: No hyperdense vessel or unexpected calcification. Skull: Normal. Negative for fracture or focal lesion. Sinuses/Orbits: No acute finding. Other: None CT CERVICAL SPINE FINDINGS Evaluation of this exam is limited due to motion artifact. Alignment: No acute subluxation. There is grade 1 C4-C5 anterolisthesis. Skull base and vertebrae: No definite acute fracture. C4 and C5 laminectomy changes. Soft tissues and spinal canal: No prevertebral fluid or swelling. No visible canal hematoma. Disc levels: Multilevel degenerative changes and disc disease with endplate irregularity and disc space narrowing. Multilevel facet arthropathy. Upper chest: Negative. Other: Bilateral carotid bulb calcified plaques. IMPRESSION: 1. No acute intracranial hemorrhage. 2. Mild age-related atrophy and chronic microvascular ischemic changes. 3. No acute/traumatic cervical spine pathology. Electronically Signed   By: Anner Crete M.D.   On: 04/04/2019 03:43   Pelvis Portable  Result Date: 04/04/2019 CLINICAL DATA:  Post right hip ORIF EXAM: PORTABLE PELVIS 1-2 VIEWS COMPARISON:  Same day radiographs and intraoperative fluoroscopy FINDINGS: Radiograph demonstrates the placement of a right femoral intramedullary Haley with transcervical cancellous fixation screw. Alignment across the fracture line is near anatomic. There is increased retraction of the fracture fragment involving the lesser trochanter of the right proximal femur. Overlying soft tissue swelling is noted. The left femoral head remains normally located with mild left hip arthrosis. Remaining bones are unremarkable. Foley catheter in place. IMPRESSION: Right femoral ORIF without evidence of hardware complication. Increased retraction of fracture fragment involving the right lesser  trochanter Electronically Signed   By: Lovena Le M.D.   On: 04/04/2019 17:54   Dg Knee Right Port  Result Date: 04/04/2019 CLINICAL DATA:  Right knee pain after fall. EXAM: PORTABLE RIGHT KNEE - 1-2 VIEW COMPARISON:  None. FINDINGS: Status post right total knee arthroplasty. The right femoral and tibial components appear to be well situated. No joint effusion is noted. No fracture or dislocation  is noted. No soft tissue abnormality is noted. IMPRESSION: Status post right total knee arthroplasty. No acute abnormality seen in the right knee. Electronically Signed   By: Marijo Conception M.D.   On: 04/04/2019 08:50   Dg C-arm 1-60 Min-no Report  Result Date: 04/04/2019 CLINICAL DATA:  Intramedullary nail placement for right femoral fracture EXAM: OPERATIVE RIGHT HIP (WITH PELVIS IF PERFORMED) 4 VIEWS TECHNIQUE: Fluoroscopic spot image(s) were submitted for interpretation post-operatively. COMPARISON:  Radiographs April 04, 2019 FINDINGS: Initial frontal and frogleg lateral views demonstrate and intertrochanteric right femur fracture. Subsequent images demonstrate the placement a short stem right femoral intramedullary nail with a transcervical cancellous screw and a distal threaded secure screw within the proximal femoral diaphysis. Post fixation alignment is near anatomic. Expected postsurgical soft tissue changes are noted. IMPRESSION: Right femoral intramedullary nail placement with near anatomic alignment. Electronically Signed   By: Lovena Le M.D.   On: 04/04/2019 17:52   Dg Hip Operative Unilat W Or W/o Pelvis Right  Result Date: 04/04/2019 CLINICAL DATA:  Intramedullary nail placement for right femoral fracture EXAM: OPERATIVE RIGHT HIP (WITH PELVIS IF PERFORMED) 4 VIEWS TECHNIQUE: Fluoroscopic spot image(s) were submitted for interpretation post-operatively. COMPARISON:  Radiographs April 04, 2019 FINDINGS: Initial frontal and frogleg lateral views demonstrate and intertrochanteric right femur  fracture. Subsequent images demonstrate the placement a short stem right femoral intramedullary nail with a transcervical cancellous screw and a distal threaded secure screw within the proximal femoral diaphysis. Post fixation alignment is near anatomic. Expected postsurgical soft tissue changes are noted. IMPRESSION: Right femoral intramedullary nail placement with near anatomic alignment. Electronically Signed   By: Lovena Le M.D.   On: 04/04/2019 17:52   Dg Hip Unilat With Pelvis 2-3 Views Right  Result Date: 04/04/2019 CLINICAL DATA:  Fall with hip pain EXAM: DG HIP (WITH OR WITHOUT PELVIS) 2-3V RIGHT COMPARISON:  None. FINDINGS: Right SI joint is non widened. Pubic symphysis is intact. Acute comminuted right intertrochanteric fracture. Right femoral head projects in joint. Skin fold artifact versus nondisplaced fracture over the right inferior pubic ramus. IMPRESSION: 1. Acute comminuted right intertrochanteric fracture. 2. Skin fold artifact versus nondisplaced fracture over the right inferior pubic ramus Electronically Signed   By: Donavan Foil M.D.   On: 04/04/2019 03:47    Assessment/Plan Fall Lack of safety awareness, unsteady gait are contributory, close supervision for safety.   Gait abnormality Worsened gait issue since R hip ORIF, continue therapy, prn Norco. Close supervision for safety.   Alzheimer disease ([place]) Continue SN[facility] for safety, care assistance.   Blood loss anemia Post op, last Hgb 9.7 04/11/19, update CBC/diff in setting of falling and on Lovenox.   Edema More noticeable, will dc Amlodipine, try Lisinopril 10mg  , BMP one week.   HTN (hypertension) Controlled Bp, dc Amlodipine-more swelling in legs, try Lisinopril 10mg  qd, monitor Bp     Family/ staff Communication: plan of care reviewed with the patient and charge nurse.   Labs/tests ordered:  BMP 1 wk.   Time spend 25 minutes.

## 2019-04-17 ENCOUNTER — Other Ambulatory Visit: Payer: Self-pay | Admitting: *Deleted

## 2019-04-17 MED ORDER — HYDROCODONE-ACETAMINOPHEN 5-325 MG PO TABS: ORAL_TABLET | ORAL | 0 refills | Status: DC

## 2019-04-17 NOTE — Telephone Encounter (Signed)
[facility] and sent to [facility] for approval.

## 2019-04-18 ENCOUNTER — Non-Acute Institutional Stay (SKILLED_NURSING_FACILITY): Payer: Medicare Other | Admitting: Nurse Practitioner

## 2019-04-18 ENCOUNTER — Encounter: Payer: Self-pay | Admitting: Nurse Practitioner

## 2019-04-18 DIAGNOSIS — F0150 Vascular dementia without behavioral disturbance: Secondary | ICD-10-CM

## 2019-04-18 DIAGNOSIS — R609 Edema, unspecified: Secondary | ICD-10-CM | POA: Diagnosis not present

## 2019-04-18 DIAGNOSIS — G309 Alzheimer's disease, unspecified: Secondary | ICD-10-CM | POA: Diagnosis not present

## 2019-04-18 DIAGNOSIS — M150 Primary generalized (osteo)arthritis: Secondary | ICD-10-CM

## 2019-04-18 DIAGNOSIS — M159 Polyosteoarthritis, unspecified: Secondary | ICD-10-CM

## 2019-04-18 DIAGNOSIS — F0280 Dementia in other diseases classified elsewhere without behavioral disturbance: Secondary | ICD-10-CM

## 2019-04-18 NOTE — Assessment & Plan Note (Signed)
Continue SN[facility] for safety, care assistance, ambulates with walker . 

## 2019-04-18 NOTE — Assessment & Plan Note (Addendum)
Right hip, s/p ORIF, prn Norco available to her, used at least 1-2x/day, will schedule Norco 5/325mg  bid, continue q4h prn.

## 2019-04-18 NOTE — Progress Notes (Addendum)
Location:  [facility] Room Number: [phone] Place of Service:  SNF (31) Provider:  Allaina Brotzman Otho Darner, NP   [name], MD  Patient Care Team: [name], MD as PCP - General (Internal Medicine) Eion Timbrook X, NP as Nurse Practitioner (Internal Medicine) [name], MD (Internal Medicine)  Extended Emergency Contact Information Primary Emergency Contact: [facility] Address: [address]          [place], [place] [address] [name] of [place] Phone: [phone] Relation: Son  Code Status:  DNR Goals of care: Advanced Directive information Advanced Directives 04/18/2019  Does Patient Have a Medical Advance Directive? Yes  Type of Advance Directive Out of facility DNR (pink MOST or yellow form);Healthcare Power of Attorney  Does patient want to make changes to medical advance directive? No - Patient declined  Copy of [place] in Chart? Yes - validated most recent copy scanned in chart (See row information)  Pre-existing out of facility DNR order (yellow form or pink MOST form) Yellow form placed in chart (order not valid for inpatient use)     Chief Complaint  Patient presents with   Acute Visit    Leg swelling     HPI:  Pt is a 85 y.o. female seen today for an acute visit for reported swelling in legs, R>L, s/p R hip ORIF, healing nicely, no per incision cellulitis. No redness, tenderness, Homans sign, or tenderness noted. She denied cough, SOB, chest pain/pressrue, or palpitation. She is afebrile, no O2 desaturation. HPI was provided with assistance of staff.    Past Medical History:  Diagnosis Date   Alzheimer disease ([place]) 10/27/2016   Arthritis    Confusion 04/04/2019   Depression, recurrent ([place]) 10/27/2016   HTN (hypertension) 10/27/2016   Hypertension    Hypertensive kidney disease with CKD (chronic kidney disease) stage V (H[facility]) 11/02/2016   11/02/16 Na 136, K 4.7, Bun 30, creat 1.52, TSH 0.86, wbc 6.7,  Hgb 11.3, plt 264   Hypothyroidism 10/27/2016   Osteoarthritis 10/27/2016   Thyroid disease    Past Surgical History:  Procedure Laterality Date   ABDOMINAL HYSTERECTOMY  1986   INTRAMEDULLARY (IM) NAIL INTERTROCHANTERIC Right 04/04/2019   Procedure: INTRAMEDULLARY (IM) NAIL INTERTROCHANTRIC;  Surgeon: Rod Can, MD;  Location: [facility];  Service: Orthopedics;  Laterality: Right;   KNEE ARTHROSCOPY  2015/2017   Dr. Gustavus Bryant   SPINE SURGERY  2009, 2011   Dr.Mark Nicoletta Dress    No Known Allergies  Outpatient Encounter Medications as of 04/18/2019  Medication Sig   cholecalciferol (VITAMIN D3) 25 MCG (1000 UT) tablet Take 1,000 Units by mouth daily.   divalproex (DEPAKOTE SPRINKLE) 125 MG capsule Take 125 mg by mouth 2 (two) times daily.   docusate sodium (COLACE) 100 MG capsule Take 1 capsule (100 mg total) by mouth 2 (two) times daily.   DULoxetine (CYMBALTA) 30 MG capsule Take 30 mg by mouth daily.   enoxaparin (LOVENOX) 40 MG/0.4ML injection Inject 40 mg into the skin daily.   HYDROcodone-acetaminophen (NORCO/VICODIN) 5-325 MG tablet Take one tablet by mouth every 4 hours as needed for pain   levothyroxine (SYNTHROID, LEVOTHROID) 25 MCG tablet Take 25 mcg by mouth daily before breakfast.   Melatonin 5 MG TABS Take 5 mg by mouth at bedtime as needed (sleep).   traZODone (DESYREL) 50 MG tablet Take 25 mg by mouth at bedtime.   vitamin B-12 (CYANOCOBALAMIN) 1000 MCG tablet Take 1,000 mcg by mouth daily.  vitamin C (ASCORBIC ACID) 500 MG tablet Take 500 mg by mouth daily.   No facility-administered encounter medications on file as of 04/18/2019.    ROS was provided with assistance of staff.  Review of Systems  Constitutional: Negative for activity change, appetite change, chills, diaphoresis, fatigue, fever and unexpected weight change.  HENT: Positive for hearing loss. Negative for congestion and voice change.   Respiratory: Negative for cough, shortness of breath and  wheezing.   Cardiovascular: Positive for leg swelling. Negative for chest pain and palpitations.  Gastrointestinal: Negative for abdominal distention, abdominal pain, constipation, diarrhea, nausea and vomiting.  Genitourinary: Negative for difficulty urinating, dysuria and urgency.  Musculoskeletal: Positive for arthralgias and gait problem.  Skin: Negative for color change and pallor.  Neurological: Negative for dizziness, speech difficulty, weakness and headaches.       Dementia  Psychiatric/Behavioral: Negative for agitation, behavioral problems, hallucinations and sleep disturbance. The patient is not nervous/anxious.     Immunization History  Administered Date(s) Administered   Influenza-Unspecified 05/29/2017   Pneumococcal Conjugate-13 06/21/2017   Tdap 06/21/2017   Pertinent  Health Maintenance Due  Topic Date Due   PNA vac Low Risk Adult (2 of 2 - PPSV23) 06/21/2018   INFLUENZA VACCINE  03/09/2019   DEXA SCAN  Completed   Fall Risk  04/27/2018 04/25/2017  Falls in the past year? No No   Functional Status Survey:    Vitals:   04/18/19 1348  BP: 130/80  Pulse: 92  Resp: 20  Temp: 98.7 F (37.1 C)  TempSrc: Oral  SpO2: 94%  Weight: 153 lb 9.6 oz (69.7 kg)  Height: 4\' 11"  (1.499 m)   Body mass index is 31.02 kg/m. Physical Exam Vitals signs and nursing note reviewed.  Constitutional:      General: She is not in acute distress.    Appearance: Normal appearance. She is not ill-appearing, toxic-appearing or diaphoretic.  HENT:     Head: Normocephalic and atraumatic.     Nose: Nose normal.     Mouth/Throat:     Mouth: Mucous membranes are moist.  Eyes:     Extraocular Movements: Extraocular movements intact.     Conjunctiva/sclera: Conjunctivae normal.     Pupils: Pupils are equal, round, and reactive to light.  Neck:     Musculoskeletal: Normal range of motion and neck supple.  Cardiovascular:     Rate and Rhythm: Normal rate and regular rhythm.      Heart sounds: No murmur.  Pulmonary:     Breath sounds: No wheezing, rhonchi or rales.  Abdominal:     General: Bowel sounds are normal. There is no distension.     Palpations: Abdomen is soft.     Tenderness: There is no abdominal tenderness. There is no right CVA tenderness, left CVA tenderness, guarding or rebound.  Musculoskeletal:     Right lower leg: Edema present.     Left lower leg: Edema present.     Comments: 1-2+ edema BLE, R>L. S/p R hip ORIF, pain with movement.   Skin:    General: Skin is warm and dry.  Neurological:     General: No focal deficit present.     Mental Status: She is alert. Mental status is at baseline.     Cranial Nerves: No cranial nerve deficit.     Motor: No weakness.     Coordination: Coordination normal.     Gait: Gait abnormal.  Psychiatric:        Mood and Affect:  Mood normal.        Behavior: Behavior normal.     Labs reviewed: Recent Labs    04/04/19 0051 04/05/19 0213 04/06/19 0226 04/11/19  NA 139 138 137 140  K 3.7 4.5 4.0 4.2  CL 105 107 105  --   CO2 22 23 26   --   GLUCOSE 132* 148* 119*  --   BUN 13 13 18 10   CREATININE 0.83 0.83 0.78 0.8  CALCIUM 10.0 9.1 9.2  --    Recent Labs    08/16/18 10/26/18  AST 9* 13  ALT 7 8  ALKPHOS 73  --   PROT 5.9  --   ALBUMIN 3.4  --    Recent Labs    04/05/19 0213 04/06/19 0226 04/07/19 0324 04/11/19  WBC 11.4* 9.0 8.0 7.4  HGB 10.9* 9.4* 8.9* 9.7*  HCT 34.7* 30.3* 29.0* 30*  MCV 95.1 94.7 96.0  --   PLT 199 170 177 354   Lab Results  Component Value Date   TSH 2.43 04/11/2019   Lab Results  Component Value Date   HGBA1C 5.3 11/01/2016   No results found for: CHOL, HDL, LDLCALC, LDLDIRECT, TRIG, CHOLHDL  Significant Diagnostic Results in last 30 days:  Dg Chest 1 View  Result Date: 04/04/2019 CLINICAL DATA:  Fall EXAM: CHEST  1 VIEW COMPARISON:  None. FINDINGS: Low lung volumes. Linear atelectasis or scarring at the left mid lung and left lung base. Right lung is  clear. Normal heart size. No pneumothorax. IMPRESSION: Low lung volumes with atelectasis and or scarring in the left mid lung and left lung base Electronically Signed   By: Donavan Foil M.D.   On: 04/04/2019 03:38   Dg Thoracic Spine 2 View  Result Date: 04/04/2019 CLINICAL DATA:  Fall EXAM: THORACIC SPINE 2 VIEWS COMPARISON:  None. FINDINGS: Limited by positioning. Thoracic alignment grossly within normal limits. Vertebral body heights appear maintained. Diffuse osteophytosis of the thoracic spine. IMPRESSION: No definite acute osseous abnormality. Electronically Signed   By: Donavan Foil M.D.   On: 04/04/2019 03:45   Dg Lumbar Spine Complete  Result Date: 04/04/2019 CLINICAL DATA:  Fall EXAM: LUMBAR SPINE - COMPLETE 4+ VIEW COMPARISON:  None. FINDINGS: Transitional anatomy suspected with 4 non rib-bearing vertebra. First non rib-bearing lumbar vertebra will be designated L1. Left lateral positioning of L2 with respect to L3 on the frontal view. This is of uncertain chronicity. Suspected lucency about the right posterior rod and fixating screws at L3-L4. Vertebral body heights are grossly maintained. Prominent degenerative changes at L3-L4. Diffuse osteophytes of the lumbar spine. IMPRESSION: 1. Suspected transitional anatomy. First non rib-bearing lumbar vertebra is designated L1 for the purposes of reporting 2. Somewhat abrupt appearing left lateral displacement of L2 with respect to L3 on AP view of uncertain chronicity. There is posterior rod and fixating screws at L3-L4 with suspected lucency/loosening about the right pedicle screws, and possible partial dislodgement of the rods and fixating screws on the right side. No gross acute fracture is seen. Electronically Signed   By: Donavan Foil M.D.   On: 04/04/2019 03:43   Ct Head Wo Contrast  Result Date: 04/04/2019 CLINICAL DATA:  85 year old female with fall EXAM: CT HEAD WITHOUT CONTRAST CT CERVICAL SPINE WITHOUT CONTRAST TECHNIQUE:  Multidetector CT imaging of the head and cervical spine was performed following the standard protocol without intravenous contrast. Multiplanar CT image reconstructions of the cervical spine were also generated. COMPARISON:  None. FINDINGS: CT HEAD FINDINGS Brain:  There is mild age-related atrophy and chronic microvascular ischemic changes. There is no acute intracranial. No mass effect or midline shift. No extra-axial fluid collection. Vascular: No hyperdense vessel or unexpected calcification. Skull: Normal. Negative for fracture or focal lesion. Sinuses/Orbits: No acute finding. Other: None CT CERVICAL SPINE FINDINGS Evaluation of this exam is limited due to motion artifact. Alignment: No acute subluxation. There is grade 1 C4-C5 anterolisthesis. Skull base and vertebrae: No definite acute fracture. C4 and C5 laminectomy changes. Soft tissues and spinal canal: No prevertebral fluid or swelling. No visible canal hematoma. Disc levels: Multilevel degenerative changes and disc disease with endplate irregularity and disc space narrowing. Multilevel facet arthropathy. Upper chest: Negative. Other: Bilateral carotid bulb calcified plaques. IMPRESSION: 1. No acute intracranial hemorrhage. 2. Mild age-related atrophy and chronic microvascular ischemic changes. 3. No acute/traumatic cervical spine pathology. Electronically Signed   By: Anner Crete M.D.   On: 04/04/2019 03:43   Ct Cervical Spine Wo Contrast  Result Date: 04/04/2019 CLINICAL DATA:  85 year old female with fall EXAM: CT HEAD WITHOUT CONTRAST CT CERVICAL SPINE WITHOUT CONTRAST TECHNIQUE: Multidetector CT imaging of the head and cervical spine was performed following the standard protocol without intravenous contrast. Multiplanar CT image reconstructions of the cervical spine were also generated. COMPARISON:  None. FINDINGS: CT HEAD FINDINGS Brain: There is mild age-related atrophy and chronic microvascular ischemic changes. There is no acute  intracranial. No mass effect or midline shift. No extra-axial fluid collection. Vascular: No hyperdense vessel or unexpected calcification. Skull: Normal. Negative for fracture or focal lesion. Sinuses/Orbits: No acute finding. Other: None CT CERVICAL SPINE FINDINGS Evaluation of this exam is limited due to motion artifact. Alignment: No acute subluxation. There is grade 1 C4-C5 anterolisthesis. Skull base and vertebrae: No definite acute fracture. C4 and C5 laminectomy changes. Soft tissues and spinal canal: No prevertebral fluid or swelling. No visible canal hematoma. Disc levels: Multilevel degenerative changes and disc disease with endplate irregularity and disc space narrowing. Multilevel facet arthropathy. Upper chest: Negative. Other: Bilateral carotid bulb calcified plaques. IMPRESSION: 1. No acute intracranial hemorrhage. 2. Mild age-related atrophy and chronic microvascular ischemic changes. 3. No acute/traumatic cervical spine pathology. Electronically Signed   By: Anner Crete M.D.   On: 04/04/2019 03:43   Pelvis Portable  Result Date: 04/04/2019 CLINICAL DATA:  Post right hip ORIF EXAM: PORTABLE PELVIS 1-2 VIEWS COMPARISON:  Same day radiographs and intraoperative fluoroscopy FINDINGS: Radiograph demonstrates the placement of a right femoral intramedullary rod with transcervical cancellous fixation screw. Alignment across the fracture line is near anatomic. There is increased retraction of the fracture fragment involving the lesser trochanter of the right proximal femur. Overlying soft tissue swelling is noted. The left femoral head remains normally located with mild left hip arthrosis. Remaining bones are unremarkable. Foley catheter in place. IMPRESSION: Right femoral ORIF without evidence of hardware complication. Increased retraction of fracture fragment involving the right lesser trochanter Electronically Signed   By: Lovena Le M.D.   On: 04/04/2019 17:54   Dg Knee Right Port  Result  Date: 04/04/2019 CLINICAL DATA:  Right knee pain after fall. EXAM: PORTABLE RIGHT KNEE - 1-2 VIEW COMPARISON:  None. FINDINGS: Status post right total knee arthroplasty. The right femoral and tibial components appear to be well situated. No joint effusion is noted. No fracture or dislocation is noted. No soft tissue abnormality is noted. IMPRESSION: Status post right total knee arthroplasty. No acute abnormality seen in the right knee. Electronically Signed   By: Sabino Dick  Jr M.D.   On: 04/04/2019 08:50   Dg C-arm 1-60 Min-no Report  Result Date: 04/04/2019 CLINICAL DATA:  Intramedullary nail placement for right femoral fracture EXAM: OPERATIVE RIGHT HIP (WITH PELVIS IF PERFORMED) 4 VIEWS TECHNIQUE: Fluoroscopic spot image(s) were submitted for interpretation post-operatively. COMPARISON:  Radiographs April 04, 2019 FINDINGS: Initial frontal and frogleg lateral views demonstrate and intertrochanteric right femur fracture. Subsequent images demonstrate the placement a short stem right femoral intramedullary nail with a transcervical cancellous screw and a distal threaded secure screw within the proximal femoral diaphysis. Post fixation alignment is near anatomic. Expected postsurgical soft tissue changes are noted. IMPRESSION: Right femoral intramedullary nail placement with near anatomic alignment. Electronically Signed   By: Lovena Le M.D.   On: 04/04/2019 17:52   Dg Hip Operative Unilat W Or W/o Pelvis Right  Result Date: 04/04/2019 CLINICAL DATA:  Intramedullary nail placement for right femoral fracture EXAM: OPERATIVE RIGHT HIP (WITH PELVIS IF PERFORMED) 4 VIEWS TECHNIQUE: Fluoroscopic spot image(s) were submitted for interpretation post-operatively. COMPARISON:  Radiographs April 04, 2019 FINDINGS: Initial frontal and frogleg lateral views demonstrate and intertrochanteric right femur fracture. Subsequent images demonstrate the placement a short stem right femoral intramedullary nail with a  transcervical cancellous screw and a distal threaded secure screw within the proximal femoral diaphysis. Post fixation alignment is near anatomic. Expected postsurgical soft tissue changes are noted. IMPRESSION: Right femoral intramedullary nail placement with near anatomic alignment. Electronically Signed   By: Lovena Le M.D.   On: 04/04/2019 17:52   Dg Hip Unilat With Pelvis 2-3 Views Right  Result Date: 04/04/2019 CLINICAL DATA:  Fall with hip pain EXAM: DG HIP (WITH OR WITHOUT PELVIS) 2-3V RIGHT COMPARISON:  None. FINDINGS: Right SI joint is non widened. Pubic symphysis is intact. Acute comminuted right intertrochanteric fracture. Right femoral head projects in joint. Skin fold artifact versus nondisplaced fracture over the right inferior pubic ramus. IMPRESSION: 1. Acute comminuted right intertrochanteric fracture. 2. Skin fold artifact versus nondisplaced fracture over the right inferior pubic ramus Electronically Signed   By: Donavan Foil M.D.   On: 04/04/2019 03:47    Assessment/Plan Edema Slightly improved since off Amlodipine, will continue to monitor wt for gaining #2-3Ibs/24hrs or #3-5Ibs over week.   Mixed Alzheimer's and vascular dementia ([place]) Continue SNF [facility] for safety, care assistance, ambulates with walker.   Osteoarthritis Right hip, s/p ORIF, prn Norco available to her, used at least 1-2x/day, will schedule Norco 5/325mg  bid, continue q4h prn.     Family/ staff Communication: plan of care reviewed with the patient and charge nurse.   Labs/tests ordered:  None  Time spend 25 minutes.

## 2019-04-18 NOTE — Assessment & Plan Note (Signed)
Slightly improved since off Amlodipine, will continue to monitor wt for gaining #2-3Ibs/24hrs or #3-5Ibs over week.

## 2019-04-19 ENCOUNTER — Encounter: Payer: Self-pay | Admitting: Nurse Practitioner

## 2019-04-19 DIAGNOSIS — D500 Iron deficiency anemia secondary to blood loss (chronic): Secondary | ICD-10-CM | POA: Insufficient documentation

## 2019-04-19 NOTE — Assessment & Plan Note (Signed)
Lack of safety awareness, unsteady gait are contributory, close supervision for safety.  

## 2019-04-19 NOTE — Assessment & Plan Note (Signed)
More noticeable, will dc Amlodipine, try Lisinopril 10mg  , BMP one week.

## 2019-04-19 NOTE — Assessment & Plan Note (Signed)
Controlled Bp, dc Amlodipine-more swelling in legs, try Lisinopril 10mg  qd, monitor Bp

## 2019-04-19 NOTE — Assessment & Plan Note (Signed)
Continue SNF [facility] for safety, care assistance.  

## 2019-04-19 NOTE — Assessment & Plan Note (Signed)
Worsened gait issue since R hip ORIF, continue therapy, prn Norco. Close supervision for safety.

## 2019-04-19 NOTE — Assessment & Plan Note (Signed)
Post op, last Hgb 9.7 04/11/19, update CBC/diff in setting of falling and on Lovenox.

## 2019-04-22 DIAGNOSIS — S72141D Displaced intertrochanteric fracture of right femur, subsequent encounter for closed fracture with routine healing: Secondary | ICD-10-CM | POA: Diagnosis not present

## 2019-04-25 LAB — CBC AND DIFFERENTIAL
HCT: 32 — AB (ref 36–46)
Hemoglobin: 10.3 — AB (ref 12.0–16.0)
Neutrophils Absolute: 4145
Platelets: 369 (ref 150–399)
WBC: 6.3

## 2019-04-25 LAB — BASIC METABOLIC PANEL
BUN: 13 (ref 4–21)
Creatinine: 0.8 (ref 0.5–1.1)
Glucose: 101
Potassium: 4.3 (ref 3.4–5.3)
Sodium: 139 (ref 137–147)

## 2019-05-07 DIAGNOSIS — Z03818 Encounter for observation for suspected exposure to other biological agents ruled out: Secondary | ICD-10-CM | POA: Diagnosis not present

## 2019-05-09 DIAGNOSIS — G4700 Insomnia, unspecified: Secondary | ICD-10-CM | POA: Diagnosis not present

## 2019-05-09 DIAGNOSIS — J329 Chronic sinusitis, unspecified: Secondary | ICD-10-CM | POA: Diagnosis not present

## 2019-05-09 DIAGNOSIS — F99 Mental disorder, not otherwise specified: Secondary | ICD-10-CM | POA: Diagnosis not present

## 2019-05-09 DIAGNOSIS — M25561 Pain in right knee: Secondary | ICD-10-CM | POA: Diagnosis not present

## 2019-05-09 DIAGNOSIS — M1990 Unspecified osteoarthritis, unspecified site: Secondary | ICD-10-CM | POA: Diagnosis not present

## 2019-05-09 DIAGNOSIS — R41841 Cognitive communication deficit: Secondary | ICD-10-CM | POA: Diagnosis not present

## 2019-05-09 DIAGNOSIS — M6281 Muscle weakness (generalized): Secondary | ICD-10-CM | POA: Diagnosis not present

## 2019-05-09 DIAGNOSIS — G301 Alzheimer's disease with late onset: Secondary | ICD-10-CM | POA: Diagnosis not present

## 2019-05-09 DIAGNOSIS — Z7389 Other problems related to life management difficulty: Secondary | ICD-10-CM | POA: Diagnosis not present

## 2019-05-09 DIAGNOSIS — I10 Essential (primary) hypertension: Secondary | ICD-10-CM | POA: Diagnosis not present

## 2019-05-09 DIAGNOSIS — R2681 Unsteadiness on feet: Secondary | ICD-10-CM | POA: Diagnosis not present

## 2019-05-09 DIAGNOSIS — Z9181 History of falling: Secondary | ICD-10-CM | POA: Diagnosis not present

## 2019-05-09 DIAGNOSIS — S72001D Fracture of unspecified part of neck of right femur, subsequent encounter for closed fracture with routine healing: Secondary | ICD-10-CM | POA: Diagnosis not present

## 2019-05-09 DIAGNOSIS — F329 Major depressive disorder, single episode, unspecified: Secondary | ICD-10-CM | POA: Diagnosis not present

## 2019-05-09 DIAGNOSIS — F339 Major depressive disorder, recurrent, unspecified: Secondary | ICD-10-CM | POA: Diagnosis not present

## 2019-05-09 DIAGNOSIS — F5105 Insomnia due to other mental disorder: Secondary | ICD-10-CM | POA: Diagnosis not present

## 2019-05-09 DIAGNOSIS — E039 Hypothyroidism, unspecified: Secondary | ICD-10-CM | POA: Diagnosis not present

## 2019-05-09 DIAGNOSIS — E642 Sequelae of vitamin C deficiency: Secondary | ICD-10-CM | POA: Diagnosis not present

## 2019-05-09 DIAGNOSIS — D518 Other vitamin B12 deficiency anemias: Secondary | ICD-10-CM | POA: Diagnosis not present

## 2019-05-09 DIAGNOSIS — E559 Vitamin D deficiency, unspecified: Secondary | ICD-10-CM | POA: Diagnosis not present

## 2019-05-09 DIAGNOSIS — E538 Deficiency of other specified B group vitamins: Secondary | ICD-10-CM | POA: Diagnosis not present

## 2019-05-10 DIAGNOSIS — M25561 Pain in right knee: Secondary | ICD-10-CM | POA: Diagnosis not present

## 2019-05-10 DIAGNOSIS — M6281 Muscle weakness (generalized): Secondary | ICD-10-CM | POA: Diagnosis not present

## 2019-05-10 DIAGNOSIS — R2681 Unsteadiness on feet: Secondary | ICD-10-CM | POA: Diagnosis not present

## 2019-05-10 DIAGNOSIS — Z7389 Other problems related to life management difficulty: Secondary | ICD-10-CM | POA: Diagnosis not present

## 2019-05-10 DIAGNOSIS — Z9181 History of falling: Secondary | ICD-10-CM | POA: Diagnosis not present

## 2019-05-10 DIAGNOSIS — S72001D Fracture of unspecified part of neck of right femur, subsequent encounter for closed fracture with routine healing: Secondary | ICD-10-CM | POA: Diagnosis not present

## 2019-05-13 DIAGNOSIS — Z7389 Other problems related to life management difficulty: Secondary | ICD-10-CM | POA: Diagnosis not present

## 2019-05-13 DIAGNOSIS — M6281 Muscle weakness (generalized): Secondary | ICD-10-CM | POA: Diagnosis not present

## 2019-05-13 DIAGNOSIS — S72001D Fracture of unspecified part of neck of right femur, subsequent encounter for closed fracture with routine healing: Secondary | ICD-10-CM | POA: Diagnosis not present

## 2019-05-13 DIAGNOSIS — Z9181 History of falling: Secondary | ICD-10-CM | POA: Diagnosis not present

## 2019-05-13 DIAGNOSIS — M25561 Pain in right knee: Secondary | ICD-10-CM | POA: Diagnosis not present

## 2019-05-13 DIAGNOSIS — R2681 Unsteadiness on feet: Secondary | ICD-10-CM | POA: Diagnosis not present

## 2019-05-14 DIAGNOSIS — M6281 Muscle weakness (generalized): Secondary | ICD-10-CM | POA: Diagnosis not present

## 2019-05-14 DIAGNOSIS — Z7389 Other problems related to life management difficulty: Secondary | ICD-10-CM | POA: Diagnosis not present

## 2019-05-14 DIAGNOSIS — Z9181 History of falling: Secondary | ICD-10-CM | POA: Diagnosis not present

## 2019-05-14 DIAGNOSIS — Z03818 Encounter for observation for suspected exposure to other biological agents ruled out: Secondary | ICD-10-CM | POA: Diagnosis not present

## 2019-05-14 DIAGNOSIS — S72001D Fracture of unspecified part of neck of right femur, subsequent encounter for closed fracture with routine healing: Secondary | ICD-10-CM | POA: Diagnosis not present

## 2019-05-14 DIAGNOSIS — M25561 Pain in right knee: Secondary | ICD-10-CM | POA: Diagnosis not present

## 2019-05-14 DIAGNOSIS — R2681 Unsteadiness on feet: Secondary | ICD-10-CM | POA: Diagnosis not present

## 2019-05-15 DIAGNOSIS — M25561 Pain in right knee: Secondary | ICD-10-CM | POA: Diagnosis not present

## 2019-05-15 DIAGNOSIS — Z7389 Other problems related to life management difficulty: Secondary | ICD-10-CM | POA: Diagnosis not present

## 2019-05-15 DIAGNOSIS — Z9181 History of falling: Secondary | ICD-10-CM | POA: Diagnosis not present

## 2019-05-15 DIAGNOSIS — S72001D Fracture of unspecified part of neck of right femur, subsequent encounter for closed fracture with routine healing: Secondary | ICD-10-CM | POA: Diagnosis not present

## 2019-05-15 DIAGNOSIS — M6281 Muscle weakness (generalized): Secondary | ICD-10-CM | POA: Diagnosis not present

## 2019-05-15 DIAGNOSIS — R2681 Unsteadiness on feet: Secondary | ICD-10-CM | POA: Diagnosis not present

## 2019-05-16 ENCOUNTER — Non-Acute Institutional Stay (SKILLED_NURSING_FACILITY): Payer: Medicare Other | Admitting: Internal Medicine

## 2019-05-16 DIAGNOSIS — G301 Alzheimer's disease with late onset: Secondary | ICD-10-CM | POA: Diagnosis not present

## 2019-05-16 DIAGNOSIS — F0280 Dementia in other diseases classified elsewhere without behavioral disturbance: Secondary | ICD-10-CM

## 2019-05-16 DIAGNOSIS — S72001A Fracture of unspecified part of neck of right femur, initial encounter for closed fracture: Secondary | ICD-10-CM

## 2019-05-16 DIAGNOSIS — E039 Hypothyroidism, unspecified: Secondary | ICD-10-CM | POA: Diagnosis not present

## 2019-05-16 NOTE — Progress Notes (Addendum)
Location:  [place] of Service:  SNF [phone]) Provider:    [name], MD  Patient Care Team: [name], MD as PCP - General (Internal Medicine) Mast, Man X, NP as Nurse Practitioner (Internal Medicine) [name], MD (Internal Medicine)  Extended Emergency Contact Information Primary Emergency Contact: [facility] Address: [address]          [place], [place] [address] [name] of [place] Phone: [phone] Relation: Son  Code Status:   Goals of care: Advanced Directive information Advanced Directives 04/18/2019  Does Patient Have a Medical Advance Directive? Yes  Type of Advance Directive Out of facility DNR (pink MOST or yellow form);Healthcare Power of Attorney  Does patient want to make changes to medical advance directive? No - Patient declined  Copy of [place] in Chart? Yes - validated most recent copy scanned in chart (See row information)  Pre-existing out of facility DNR order (yellow form or pink MOST form) Yellow form placed in chart (order not valid for inpatient use)     Chief Complaint  Patient presents with  . Acute Visit    HPI:  Pt is a 85 y.o. female seen today for an acute visit for Rash in the Groin area  Patient was admitted from 8/26-8/30 for right hip fracture secondary to mechanical fall Underwent intramedullary fixation on 8/27 Patient has h/o Hypertension, Hypothyroidism, Alzheimer's Dementia with behavior issues, Depression, B12 def She lives in Memory Unit Nurses had noticed that patient had developed rash mostly in left side of her inguinal folds. Patient denies any itching any pain.  Past Medical History:  Diagnosis Date  . Alzheimer disease ([place]) 10/27/2016  . Arthritis   . Confusion 04/04/2019  . Depression, recurrent ([place]) 10/27/2016  . HTN (hypertension) 10/27/2016  . Hypertension   . Hypertensive kidney disease with CKD (chronic kidney disease) stage V ([place])  11/02/2016   11/02/16 Na 136, K 4.7, Bun 30, creat 1.52, TSH 0.86, wbc 6.7, Hgb 11.3, plt 264  . Hypothyroidism 10/27/2016  . Osteoarthritis 10/27/2016  . Thyroid disease    Past Surgical History:  Procedure Laterality Date  . ABDOMINAL HYSTERECTOMY  1986  . INTRAMEDULLARY (IM) NAIL INTERTROCHANTERIC Right 04/04/2019   Procedure: INTRAMEDULLARY (IM) NAIL INTERTROCHANTRIC;  Surgeon: Rod Can, MD;  Location: [facility];  Service: Orthopedics;  Laterality: Right;  . KNEE ARTHROSCOPY  2015/2017   Dr. Gustavus Bryant  . SPINE SURGERY  2009, 2011   Dr.Mark Nicoletta Dress    No Known Allergies  Outpatient Encounter Medications as of 05/16/2019  Medication Sig  . HYDROcodone-acetaminophen (NORCO/VICODIN) 5-325 MG tablet Take 1 tablet by mouth 2 (two) times daily.  Marland Kitchen lisinopril (ZESTRIL) 10 MG tablet Take 10 mg by mouth daily.  . cholecalciferol (VITAMIN D3) 25 MCG (1000 UT) tablet Take 1,000 Units by mouth daily.  . divalproex (DEPAKOTE SPRINKLE) 125 MG capsule Take 125 mg by mouth 2 (two) times daily.  Marland Kitchen docusate sodium (COLACE) 100 MG capsule Take 1 capsule (100 mg total) by mouth 2 (two) times daily.  . DULoxetine (CYMBALTA) 30 MG capsule Take 30 mg by mouth daily.  Marland Kitchen HYDROcodone-acetaminophen (NORCO/VICODIN) 5-325 MG tablet Take one tablet by mouth every 4 hours as needed for pain  . levothyroxine (SYNTHROID, LEVOTHROID) 25 MCG tablet Take 25 mcg by mouth daily before breakfast.  . Melatonin 5 MG TABS Take 5 mg by mouth at bedtime as needed (sleep).  . traZODone (DESYREL) 50 MG tablet Take 25  mg by mouth at bedtime.  . vitamin B-12 (CYANOCOBALAMIN) 1000 MCG tablet Take 1,000 mcg by mouth daily.  . vitamin C (ASCORBIC ACID) 500 MG tablet Take 500 mg by mouth daily.   No facility-administered encounter medications on file as of 05/16/2019.     Review of Systems  Skin: Positive for rash.  All other systems reviewed and are negative.   Immunization History  Administered Date(s) Administered  .  Influenza-Unspecified 05/29/2017  . Pneumococcal Conjugate-13 06/21/2017  . Tdap 06/21/2017   Pertinent  Health Maintenance Due  Topic Date Due  . PNA vac Low Risk Adult (2 of 2 - PPSV23) 06/21/2018  . INFLUENZA VACCINE  03/09/2019  . DEXA SCAN  Completed   Fall Risk  04/27/2018 04/25/2017  Falls in the past year? No No   Functional Status Survey:    Vitals:   05/25/19 2051  BP: (!) 148/82  Pulse: 76  Temp: (!) 97.3 F (36.3 C)   There is no height or weight on file to calculate BMI. Physical Exam Vitals signs reviewed.   Constitutional:Well-developed and well-nourished.  HENT:  Head: Normocephalic.  Mouth/Throat: Oropharynx is clear and moist.  Eyes: Pupils are equal, round, and reactive to light.  Neck: Neck supple.  Cardiovascular: Normal rate and normal heart sounds.  No murmur heard. Pulmonary/Chest: Effort normal and breath sounds normal. No respiratory distress. No wheezes. She has no rales.  Abdominal: Soft. Bowel sounds are normal. No distension. There is no tenderness. There is no rebound.  Musculoskeletal:Bilateral Mild Edema Lymphadenopathy: none Neurological: No Focal Deficits Skin: Skin is warm and dry. Has Rash in Left Groin Area in inguinal Folds Psychiatric: Normal mood and affect. Behavior is normal. Thought content normal.    Labs reviewed: Recent Labs    04/04/19 0051 04/05/19 0213 04/06/19 0226 04/11/19  NA 139 138 137 140  K 3.7 4.5 4.0 4.2  CL 105 107 105  --   CO2 22 23 26   --   GLUCOSE 132* 148* 119*  --   BUN 13 13 18 10   CREATININE 0.83 0.83 0.78 0.8  CALCIUM 10.0 9.1 9.2  --    Recent Labs    08/16/18 10/26/18  AST 9* 13  ALT 7 8  ALKPHOS 73  --   PROT 5.9  --   ALBUMIN 3.4  --    Recent Labs    04/05/19 0213 04/06/19 0226 04/07/19 0324 04/11/19  WBC 11.4* 9.0 8.0 7.4  HGB 10.9* 9.4* 8.9* 9.7*  HCT 34.7* 30.3* 29.0* 30*  MCV 95.1 94.7 96.0  --   PLT 199 170 177 354   Lab Results  Component Value Date   TSH 2.43  04/11/2019   Lab Results  Component Value Date   HGBA1C 5.3 11/01/2016   No results found for: CHOL, HDL, LDLCALC, LDLDIRECT, TRIG, CHOLHDL  Significant Diagnostic Results in last 30 days:  No results found.  Assessment/Plan Inguinal Rash Will treat with Nystatin Reval if not better s/P Right Hip nailing Pain seems to be controlled on Tylenol And Norco Walks wit the walker  Essential hypertension On lisinopril Acquired hypothyroidism TSH Normal in 01/20 Continue same dose  Late onset Alzheimer's disease with behavioral disturbance On Depakote and Trazodone She continues ot be high Fall risk  Major depression, chronic Continue on Cymbalta B12 deficiency On B12 supplement    Family/ staff Communication:   Labs/tests ordered:   Total time spent in this patient care encounter was  25_  minutes; greater  than 50% of the visit spent counseling patient and staff, reviewing records , Labs and coordinating care for problems addressed at this encounter.

## 2019-05-17 DIAGNOSIS — R2681 Unsteadiness on feet: Secondary | ICD-10-CM | POA: Diagnosis not present

## 2019-05-17 DIAGNOSIS — Z7389 Other problems related to life management difficulty: Secondary | ICD-10-CM | POA: Diagnosis not present

## 2019-05-17 DIAGNOSIS — M25561 Pain in right knee: Secondary | ICD-10-CM | POA: Diagnosis not present

## 2019-05-17 DIAGNOSIS — M6281 Muscle weakness (generalized): Secondary | ICD-10-CM | POA: Diagnosis not present

## 2019-05-17 DIAGNOSIS — Z9181 History of falling: Secondary | ICD-10-CM | POA: Diagnosis not present

## 2019-05-17 DIAGNOSIS — S72001D Fracture of unspecified part of neck of right femur, subsequent encounter for closed fracture with routine healing: Secondary | ICD-10-CM | POA: Diagnosis not present

## 2019-05-20 DIAGNOSIS — S72141D Displaced intertrochanteric fracture of right femur, subsequent encounter for closed fracture with routine healing: Secondary | ICD-10-CM | POA: Diagnosis not present

## 2019-05-20 DIAGNOSIS — R2681 Unsteadiness on feet: Secondary | ICD-10-CM | POA: Diagnosis not present

## 2019-05-20 DIAGNOSIS — Z7389 Other problems related to life management difficulty: Secondary | ICD-10-CM | POA: Diagnosis not present

## 2019-05-20 DIAGNOSIS — M25561 Pain in right knee: Secondary | ICD-10-CM | POA: Diagnosis not present

## 2019-05-20 DIAGNOSIS — Z9181 History of falling: Secondary | ICD-10-CM | POA: Diagnosis not present

## 2019-05-20 DIAGNOSIS — S72001D Fracture of unspecified part of neck of right femur, subsequent encounter for closed fracture with routine healing: Secondary | ICD-10-CM | POA: Diagnosis not present

## 2019-05-20 DIAGNOSIS — M6281 Muscle weakness (generalized): Secondary | ICD-10-CM | POA: Diagnosis not present

## 2019-05-21 DIAGNOSIS — S72001D Fracture of unspecified part of neck of right femur, subsequent encounter for closed fracture with routine healing: Secondary | ICD-10-CM | POA: Diagnosis not present

## 2019-05-21 DIAGNOSIS — M6281 Muscle weakness (generalized): Secondary | ICD-10-CM | POA: Diagnosis not present

## 2019-05-21 DIAGNOSIS — Z9181 History of falling: Secondary | ICD-10-CM | POA: Diagnosis not present

## 2019-05-21 DIAGNOSIS — M25561 Pain in right knee: Secondary | ICD-10-CM | POA: Diagnosis not present

## 2019-05-21 DIAGNOSIS — R2681 Unsteadiness on feet: Secondary | ICD-10-CM | POA: Diagnosis not present

## 2019-05-21 DIAGNOSIS — Z7389 Other problems related to life management difficulty: Secondary | ICD-10-CM | POA: Diagnosis not present

## 2019-05-22 DIAGNOSIS — Z9181 History of falling: Secondary | ICD-10-CM | POA: Diagnosis not present

## 2019-05-22 DIAGNOSIS — S72001D Fracture of unspecified part of neck of right femur, subsequent encounter for closed fracture with routine healing: Secondary | ICD-10-CM | POA: Diagnosis not present

## 2019-05-22 DIAGNOSIS — R2681 Unsteadiness on feet: Secondary | ICD-10-CM | POA: Diagnosis not present

## 2019-05-22 DIAGNOSIS — Z7389 Other problems related to life management difficulty: Secondary | ICD-10-CM | POA: Diagnosis not present

## 2019-05-22 DIAGNOSIS — M25561 Pain in right knee: Secondary | ICD-10-CM | POA: Diagnosis not present

## 2019-05-22 DIAGNOSIS — M6281 Muscle weakness (generalized): Secondary | ICD-10-CM | POA: Diagnosis not present

## 2019-05-23 DIAGNOSIS — M6281 Muscle weakness (generalized): Secondary | ICD-10-CM | POA: Diagnosis not present

## 2019-05-23 DIAGNOSIS — Z9181 History of falling: Secondary | ICD-10-CM | POA: Diagnosis not present

## 2019-05-23 DIAGNOSIS — R2681 Unsteadiness on feet: Secondary | ICD-10-CM | POA: Diagnosis not present

## 2019-05-23 DIAGNOSIS — Z7389 Other problems related to life management difficulty: Secondary | ICD-10-CM | POA: Diagnosis not present

## 2019-05-23 DIAGNOSIS — M25561 Pain in right knee: Secondary | ICD-10-CM | POA: Diagnosis not present

## 2019-05-23 DIAGNOSIS — S72001D Fracture of unspecified part of neck of right femur, subsequent encounter for closed fracture with routine healing: Secondary | ICD-10-CM | POA: Diagnosis not present

## 2019-05-24 DIAGNOSIS — S72001D Fracture of unspecified part of neck of right femur, subsequent encounter for closed fracture with routine healing: Secondary | ICD-10-CM | POA: Diagnosis not present

## 2019-05-24 DIAGNOSIS — Z9181 History of falling: Secondary | ICD-10-CM | POA: Diagnosis not present

## 2019-05-24 DIAGNOSIS — M6281 Muscle weakness (generalized): Secondary | ICD-10-CM | POA: Diagnosis not present

## 2019-05-24 DIAGNOSIS — M25561 Pain in right knee: Secondary | ICD-10-CM | POA: Diagnosis not present

## 2019-05-24 DIAGNOSIS — R2681 Unsteadiness on feet: Secondary | ICD-10-CM | POA: Diagnosis not present

## 2019-05-24 DIAGNOSIS — Z7389 Other problems related to life management difficulty: Secondary | ICD-10-CM | POA: Diagnosis not present

## 2019-05-25 ENCOUNTER — Encounter: Payer: Self-pay | Admitting: Internal Medicine

## 2019-05-27 ENCOUNTER — Encounter: Payer: Self-pay | Admitting: Nurse Practitioner

## 2019-05-27 ENCOUNTER — Non-Acute Institutional Stay (SKILLED_NURSING_FACILITY): Payer: Medicare Other | Admitting: Nurse Practitioner

## 2019-05-27 DIAGNOSIS — F320 Major depressive disorder, single episode, mild: Secondary | ICD-10-CM

## 2019-05-27 DIAGNOSIS — F0281 Dementia in other diseases classified elsewhere with behavioral disturbance: Secondary | ICD-10-CM

## 2019-05-27 DIAGNOSIS — M153 Secondary multiple arthritis: Secondary | ICD-10-CM | POA: Diagnosis not present

## 2019-05-27 DIAGNOSIS — E039 Hypothyroidism, unspecified: Secondary | ICD-10-CM

## 2019-05-27 DIAGNOSIS — M25561 Pain in right knee: Secondary | ICD-10-CM | POA: Diagnosis not present

## 2019-05-27 DIAGNOSIS — D500 Iron deficiency anemia secondary to blood loss (chronic): Secondary | ICD-10-CM | POA: Diagnosis not present

## 2019-05-27 DIAGNOSIS — E538 Deficiency of other specified B group vitamins: Secondary | ICD-10-CM | POA: Diagnosis not present

## 2019-05-27 DIAGNOSIS — Z9181 History of falling: Secondary | ICD-10-CM | POA: Diagnosis not present

## 2019-05-27 DIAGNOSIS — G308 Other Alzheimer's disease: Secondary | ICD-10-CM | POA: Diagnosis not present

## 2019-05-27 DIAGNOSIS — Z7389 Other problems related to life management difficulty: Secondary | ICD-10-CM | POA: Diagnosis not present

## 2019-05-27 DIAGNOSIS — M6281 Muscle weakness (generalized): Secondary | ICD-10-CM | POA: Diagnosis not present

## 2019-05-27 DIAGNOSIS — F02818 Dementia in other diseases classified elsewhere, unspecified severity, with other behavioral disturbance: Secondary | ICD-10-CM

## 2019-05-27 DIAGNOSIS — R269 Unspecified abnormalities of gait and mobility: Secondary | ICD-10-CM | POA: Diagnosis not present

## 2019-05-27 DIAGNOSIS — S72001D Fracture of unspecified part of neck of right femur, subsequent encounter for closed fracture with routine healing: Secondary | ICD-10-CM | POA: Diagnosis not present

## 2019-05-27 DIAGNOSIS — R2681 Unsteadiness on feet: Secondary | ICD-10-CM | POA: Diagnosis not present

## 2019-05-27 LAB — CHLORIDE
Calcium: 10
Carbon Dioxide, Total: 27
Chloride: 106

## 2019-05-27 NOTE — Progress Notes (Signed)
Location:   SNF [place] Room Number: 106 Place of Service:  SNF (31) Provider:  Virdell Hoiland NP  [name], MD  Patient Care Team: [name], MD as PCP - General (Internal Medicine) Peace Jost X, NP as Nurse Practitioner (Internal Medicine) [name], MD (Internal Medicine)  Extended Emergency Contact Information Primary Emergency Contact: [facility] Address: [address]          [place], [place] [address] [name] of [place] Phone: [phone] Relation: Son  Code Status:  DNR Goals of care: Advanced Directive information Advanced Directives 05/27/2019  Does Patient Have a Medical Advance Directive? Yes  Type of Paramedic of [place];Out of facility DNR (pink MOST or yellow form)  Does patient want to make changes to medical advance directive? No - Patient declined  Copy of [place] in Chart? Yes - validated most recent copy scanned in chart (See row information)  Pre-existing out of facility DNR order (yellow form or pink MOST form) Yellow form placed in chart (order not valid for inpatient use)     Chief Complaint  Patient presents with  . Medical Management of Chronic Issues  . Health Maintenance    PNA and influenza vaccine    HPI:  Pt is a 85 y.o. female seen today for medical management of chronic diseases.    The patient resides in memory care unit [facility] for safety, care assistance, ambulates with walker, her mood is stable on Trazodone 25mg  qd, Depakote 125mg  bid, Duloxetine 30mg  qd. R hip pain, stable, on Norco 5/325mg  bid. Hypothyroidism, on Levothyroxine 67mcg qd, last TSH 2.43 04/11/19. HTN, blood pressure is controlled on Lisinopril 10mg  qd. Anemia, on Vit B12 1071mcg qd, last  04/25/19 Hgb 10.3   Past Medical History:  Diagnosis Date  . Alzheimer disease ([place]) 10/27/2016  . Arthritis   . Confusion 04/04/2019  . Depression, recurrent ([place]) 10/27/2016  . HTN (hypertension) 10/27/2016   . Hypertension   . Hypertensive kidney disease with CKD (chronic kidney disease) stage V ([place]) 11/02/2016   11/02/16 Na 136, K 4.7, Bun 30, creat 1.52, TSH 0.86, wbc 6.7, Hgb 11.3, plt 264  . Hypothyroidism 10/27/2016  . Osteoarthritis 10/27/2016  . Thyroid disease    Past Surgical History:  Procedure Laterality Date  . ABDOMINAL HYSTERECTOMY  1986  . INTRAMEDULLARY (IM) NAIL INTERTROCHANTERIC Right 04/04/2019   Procedure: INTRAMEDULLARY (IM) NAIL INTERTROCHANTRIC;  Surgeon: Rod Can, MD;  [facility]: [facility];  Service: Orthopedics;  Laterality: Right;  . KNEE ARTHROSCOPY  2015/2017   Dr. Gustavus Bryant  . SPINE SURGERY  2009, 2011   Dr.Mark Nicoletta Dress    No Known Allergies  Allergies as of 05/27/2019   No Known Allergies     Medication List       Accurate as of May 27, 2019  4:33 PM. If you have any questions, ask your nurse or doctor.        cholecalciferol 25 MCG (1000 UT) tablet Commonly known as: VITAMIN D3 Take 1,000 Units by mouth daily.   divalproex 125 MG capsule Commonly known as: DEPAKOTE SPRINKLE Take 125 mg by mouth 2 (two) times daily.   docusate sodium 100 MG capsule Commonly known as: COLACE Take 1 capsule (100 mg total) by mouth 2 (two) times daily.   DULoxetine 30 MG capsule Commonly known as: CYMBALTA Take 30 mg by mouth daily.   HYDROcodone-acetaminophen 5-325 MG tablet Commonly known as: NORCO/VICODIN Take 1 tablet by mouth  2 (two) times daily.   HYDROcodone-acetaminophen 5-325 MG tablet Commonly known as: NORCO/VICODIN Take one tablet by mouth every 4 hours as needed for pain   levothyroxine 25 MCG tablet Commonly known as: SYNTHROID Take 25 mcg by mouth daily before breakfast.   lisinopril 10 MG tablet Commonly known as: ZESTRIL Take 10 mg by mouth daily.   Melatonin 5 MG Tabs Take 5 mg by mouth at bedtime as needed (sleep).   traZODone 50 MG tablet Commonly known as: DESYREL Take 25 mg by mouth at bedtime.   vitamin B-12 1000 MCG  tablet Commonly known as: CYANOCOBALAMIN Take 1,000 mcg by mouth daily.   vitamin C 500 MG tablet Commonly known as: ASCORBIC ACID Take 500 mg by mouth daily.      ROS was provided with assistance of staff.  Review of Systems  Constitutional: Negative for activity change, appetite change, chills, diaphoresis, fatigue and unexpected weight change.  HENT: Positive for hearing loss. Negative for congestion and voice change.   Eyes: Negative for visual disturbance.  Respiratory: Negative for cough, shortness of breath and wheezing.   Cardiovascular: Positive for leg swelling. Negative for chest pain and palpitations.  Gastrointestinal: Negative for abdominal distention, abdominal pain, constipation, diarrhea, nausea and vomiting.  Genitourinary: Negative for difficulty urinating, dysuria and urgency.  Musculoskeletal: Positive for arthralgias and gait problem.  Skin: Negative for color change and pallor.  Neurological: Negative for dizziness, speech difficulty, weakness and headaches.       Dementia  Psychiatric/Behavioral: Positive for confusion. Negative for agitation, behavioral problems, hallucinations and sleep disturbance. The patient is not nervous/anxious.     Immunization History  Administered Date(s) Administered  . Influenza-Unspecified 05/29/2017  . Pneumococcal Conjugate-13 06/21/2017  . Tdap 06/21/2017   Pertinent  Health Maintenance Due  Topic Date Due  . PNA vac Low Risk Adult (2 of 2 - PPSV23) 06/21/2018  . INFLUENZA VACCINE  03/09/2019  . DEXA SCAN  Completed   Fall Risk  04/27/2018 04/25/2017  Falls in the past year? No No   Functional Status Survey:    Vitals:   05/27/19 1523  BP: (!) 148/82  Pulse: 76  Resp: 18  Temp: (!) 97.3 F (36.3 C)  SpO2: 96%  Weight: 149 lb 3.2 oz (67.7 kg)  Height: 4\' 11"  (1.499 m)   Body mass index is 30.13 kg/m. Physical Exam Vitals signs and nursing note reviewed.  Constitutional:      General: She is not in acute  distress.    Appearance: Normal appearance. She is not ill-appearing, toxic-appearing or diaphoretic.  HENT:     Head: Normocephalic and atraumatic.     Nose: Nose normal.     Mouth/Throat:     Mouth: Mucous membranes are moist.  Eyes:     Extraocular Movements: Extraocular movements intact.     Conjunctiva/sclera: Conjunctivae normal.     Pupils: Pupils are equal, round, and reactive to light.  Neck:     Musculoskeletal: Normal range of motion and neck supple.  Cardiovascular:     Rate and Rhythm: Regular rhythm.     Heart sounds: No murmur.  Pulmonary:     Breath sounds: No wheezing or rhonchi.  Chest:     Chest wall: No tenderness.  Abdominal:     General: Bowel sounds are normal.     Palpations: Abdomen is soft.     Tenderness: There is no abdominal tenderness. There is no right CVA tenderness, left CVA tenderness, guarding or rebound.  Musculoskeletal:     Right lower leg: Edema present.     Left lower leg: No edema.     Comments: Ambulates with walker, R hip pain is well controlled.   Skin:    General: Skin is warm and dry.  Neurological:     General: No focal deficit present.     Mental Status: She is alert. Mental status is at baseline.     Cranial Nerves: No cranial nerve deficit.     Coordination: Coordination normal.     Gait: Gait abnormal.     Comments: Oriented to self.  Psychiatric:        Mood and Affect: Mood normal.        Behavior: Behavior normal.     Labs reviewed: Recent Labs    04/04/19 0051 04/05/19 0213 04/06/19 0226 04/11/19 04/25/19 05/25/19  NA 139 138 137 140 139  --   K 3.7 4.5 4.0 4.2 4.3  --   CL 105 107 105  --   --  106  CO2 22 23 26   --   --  27  GLUCOSE 132* 148* 119*  --   --   --   BUN 13 13 18 10 13   --   CREATININE 0.83 0.83 0.78 0.8 0.8  --   CALCIUM 10.0 9.1 9.2  --   --  10.0   Recent Labs    08/16/18 10/26/18  AST 9* 13  ALT 7 8  ALKPHOS 73  --   PROT 5.9  --   ALBUMIN 3.4  --    Recent Labs    04/05/19  0213 04/06/19 0226 04/07/19 0324 04/11/19 04/25/19  WBC 11.4* 9.0 8.0 7.4 6.3  NEUTROABS  --   --   --   --  4,145  HGB 10.9* 9.4* 8.9* 9.7* 10.3*  HCT 34.7* 30.3* 29.0* 30* 32*  MCV 95.1 94.7 96.0  --   --   PLT 199 170 177 354 369   Lab Results  Component Value Date   TSH 2.43 04/11/2019   Lab Results  Component Value Date   HGBA1C 5.3 11/01/2016   No results found for: CHOL, HDL, LDLCALC, LDLDIRECT, TRIG, CHOLHDL  Significant Diagnostic Results in last 30 days:  No results found.  Assessment/Plan B12 deficiency Stable, continue Vit B12 1073mcg qd.   Blood loss anemia Improved, last Hgb 10.8 04/25/19  Depression Her mood is stable, continue Trazodone 25mg  qd, Depakote 125mg  bid, Duloxetine 30mg  qd.   Gait abnormality Continue to ambulate with walker.   Osteoarthritis R hip pain, continue Norco 5/325mg  bid.   Alzheimer disease [facility]) Continue memory care unit [facility] for safety, care assistance.   Hypothyroidism Stable, last TSH wnl 2.43 04/11/19, continue Levothyroxine 8mcg qd.      Family/ staff Communication: plan of care reviewed with the patient and charge nurse.   Labs/tests ordered:  none  Time spend 25 minutes.

## 2019-05-27 NOTE — Assessment & Plan Note (Signed)
Stable, continue Vit B12 1034mcg qd.

## 2019-05-27 NOTE — Assessment & Plan Note (Signed)
Continue to ambulate with walker.  

## 2019-05-27 NOTE — Assessment & Plan Note (Signed)
Continue memory care unit [facility] for safety, care assistance.  

## 2019-05-27 NOTE — Assessment & Plan Note (Signed)
Improved, last Hgb 10.8 04/25/19

## 2019-05-27 NOTE — Assessment & Plan Note (Signed)
Her mood is stable, continue Trazodone 25mg  qd, Depakote 125mg  bid, Duloxetine 30mg  qd.

## 2019-05-27 NOTE — Assessment & Plan Note (Signed)
Stable, last TSH wnl 2.43 04/11/19, continue Levothyroxine 72mcg qd.

## 2019-05-27 NOTE — Assessment & Plan Note (Signed)
R hip pain, continue Norco 5/325mg  bid.

## 2019-05-28 DIAGNOSIS — M25561 Pain in right knee: Secondary | ICD-10-CM | POA: Diagnosis not present

## 2019-05-28 DIAGNOSIS — Z7389 Other problems related to life management difficulty: Secondary | ICD-10-CM | POA: Diagnosis not present

## 2019-05-28 DIAGNOSIS — S72001D Fracture of unspecified part of neck of right femur, subsequent encounter for closed fracture with routine healing: Secondary | ICD-10-CM | POA: Diagnosis not present

## 2019-05-28 DIAGNOSIS — M6281 Muscle weakness (generalized): Secondary | ICD-10-CM | POA: Diagnosis not present

## 2019-05-28 DIAGNOSIS — R2681 Unsteadiness on feet: Secondary | ICD-10-CM | POA: Diagnosis not present

## 2019-05-28 DIAGNOSIS — Z9181 History of falling: Secondary | ICD-10-CM | POA: Diagnosis not present

## 2019-05-29 DIAGNOSIS — M6281 Muscle weakness (generalized): Secondary | ICD-10-CM | POA: Diagnosis not present

## 2019-05-29 DIAGNOSIS — S72001D Fracture of unspecified part of neck of right femur, subsequent encounter for closed fracture with routine healing: Secondary | ICD-10-CM | POA: Diagnosis not present

## 2019-05-29 DIAGNOSIS — Z7389 Other problems related to life management difficulty: Secondary | ICD-10-CM | POA: Diagnosis not present

## 2019-05-29 DIAGNOSIS — M25561 Pain in right knee: Secondary | ICD-10-CM | POA: Diagnosis not present

## 2019-05-29 DIAGNOSIS — Z9181 History of falling: Secondary | ICD-10-CM | POA: Diagnosis not present

## 2019-05-29 DIAGNOSIS — R2681 Unsteadiness on feet: Secondary | ICD-10-CM | POA: Diagnosis not present

## 2019-05-30 ENCOUNTER — Non-Acute Institutional Stay (SKILLED_NURSING_FACILITY): Payer: Medicare Other | Admitting: Internal Medicine

## 2019-05-30 ENCOUNTER — Encounter: Payer: Self-pay | Admitting: Internal Medicine

## 2019-05-30 DIAGNOSIS — S72001D Fracture of unspecified part of neck of right femur, subsequent encounter for closed fracture with routine healing: Secondary | ICD-10-CM | POA: Diagnosis not present

## 2019-05-30 DIAGNOSIS — S72001S Fracture of unspecified part of neck of right femur, sequela: Secondary | ICD-10-CM | POA: Diagnosis not present

## 2019-05-30 DIAGNOSIS — G308 Other Alzheimer's disease: Secondary | ICD-10-CM | POA: Diagnosis not present

## 2019-05-30 DIAGNOSIS — M6281 Muscle weakness (generalized): Secondary | ICD-10-CM | POA: Diagnosis not present

## 2019-05-30 DIAGNOSIS — Z7389 Other problems related to life management difficulty: Secondary | ICD-10-CM | POA: Diagnosis not present

## 2019-05-30 DIAGNOSIS — F0281 Dementia in other diseases classified elsewhere with behavioral disturbance: Secondary | ICD-10-CM

## 2019-05-30 DIAGNOSIS — Z9181 History of falling: Secondary | ICD-10-CM | POA: Diagnosis not present

## 2019-05-30 DIAGNOSIS — R600 Localized edema: Secondary | ICD-10-CM

## 2019-05-30 DIAGNOSIS — R2681 Unsteadiness on feet: Secondary | ICD-10-CM | POA: Diagnosis not present

## 2019-05-30 DIAGNOSIS — M25561 Pain in right knee: Secondary | ICD-10-CM | POA: Diagnosis not present

## 2019-05-30 NOTE — Progress Notes (Signed)
Location:    Nursing Home Room Number: 106 Place of Service:  SNF (31) Provider:  [name], MD  [name], MD  Patient Care Team: [name], MD as PCP - General (Internal Medicine) Mast, Man X, NP as Nurse Practitioner (Internal Medicine) [name], MD (Internal Medicine)  Extended Emergency Contact Information Primary Emergency Contact: [facility] Address: [address]          [place], [place] [address] [name] of [place] Phone: [phone] Relation: Son  Code Status:  DNR Goals of care: Advanced Directive information Advanced Directives 05/27/2019  Does Patient Have a Medical Advance Directive? Yes  Type of Paramedic of [place];Out of facility DNR (pink MOST or yellow form)  Does patient want to make changes to medical advance directive? No - Patient declined  Copy of [place] in Chart? Yes - validated most recent copy scanned in chart (See row information)  Pre-existing out of facility DNR order (yellow form or pink MOST form) Yellow form placed in chart (order not valid for inpatient use)     Chief Complaint  Patient presents with  . Acute Visit    Behavior issues    HPI:  Pt is a 85 y.o. female seen today for an acute visit for Behavior Issues  Patient was admitted from8/26-8/30for right hip fracture secondary to mechanical fall Underwent intramedullary fixation on8/27 Patient has h/o Hypertension, Hypothyroidism, Alzheimer's Dementia with behavior issues, Depression, B12 def She lives in Memory Unit Per Nurses she has been having some issues with her behavior including trying to exit the facility and resisting care She is on Depakote but they were wondering if anything else can be done for these episodes Patient herself was very pleasant did not have any complains. Now is walking with walker and no falls or Pain in the leg  Past Medical History:  Diagnosis Date  . Alzheimer  disease ([place]) 10/27/2016  . Arthritis   . Confusion 04/04/2019  . Depression, recurrent ([place]) 10/27/2016  . HTN (hypertension) 10/27/2016  . Hypertension   . Hypertensive kidney disease with CKD (chronic kidney disease) stage V ([place]) 11/02/2016   11/02/16 Na 136, K 4.7, Bun 30, creat 1.52, TSH 0.86, wbc 6.7, Hgb 11.3, plt 264  . Hypothyroidism 10/27/2016  . Osteoarthritis 10/27/2016  . Thyroid disease    Past Surgical History:  Procedure Laterality Date  . ABDOMINAL HYSTERECTOMY  1986  . INTRAMEDULLARY (IM) NAIL INTERTROCHANTERIC Right 04/04/2019   Procedure: INTRAMEDULLARY (IM) NAIL INTERTROCHANTRIC;  Surgeon: Rod Can, MD;  Location: [facility];  Service: Orthopedics;  Laterality: Right;  . KNEE ARTHROSCOPY  2015/2017   Dr. Gustavus Bryant  . SPINE SURGERY  2009, 2011   Dr.Mark Nicoletta Dress    No Known Allergies  Allergies as of 05/30/2019   No Known Allergies     Medication List       Accurate as of May 30, 2019 11:59 PM. If you have any questions, ask your nurse or doctor.        cholecalciferol 25 MCG (1000 UT) tablet Commonly known as: VITAMIN D3 Take 1,000 Units by mouth daily.   divalproex 125 MG capsule Commonly known as: DEPAKOTE SPRINKLE Take 125 mg by mouth 2 (two) times daily.   docusate sodium 100 MG capsule Commonly known as: COLACE Take 1 capsule (100 mg total) by mouth 2 (two) times daily.   DULoxetine 30 MG capsule Commonly known as: CYMBALTA Take 30 mg by mouth daily.  HYDROcodone-acetaminophen 5-325 MG tablet Commonly known as: NORCO/VICODIN Take 1 tablet by mouth 2 (two) times daily.   HYDROcodone-acetaminophen 5-325 MG tablet Commonly known as: NORCO/VICODIN Take one tablet by mouth every 4 hours as needed for pain   levothyroxine 25 MCG tablet Commonly known as: SYNTHROID Take 25 mcg by mouth daily before breakfast.   lisinopril 10 MG tablet Commonly known as: ZESTRIL Take 10 mg by mouth daily.   Melatonin 5 MG Tabs Take 5 mg by mouth at  bedtime as needed (sleep).   traZODone 50 MG tablet Commonly known as: DESYREL Take 25 mg by mouth at bedtime.   vitamin B-12 1000 MCG tablet Commonly known as: CYANOCOBALAMIN Take 1,000 mcg by mouth daily.   vitamin C 500 MG tablet Commonly known as: ASCORBIC ACID Take 500 mg by mouth daily.       Review of Systems  Review of Systems  Constitutional: Negative for activity change, appetite change, chills, diaphoresis, fatigue and fever.  HENT: Negative for mouth sores, postnasal drip, rhinorrhea, sinus pain and sore throat.   Respiratory: Negative for apnea, cough, chest tightness, shortness of breath and wheezing.   Cardiovascular: Negative for chest pain, palpitations and leg swelling.  Gastrointestinal: Negative for abdominal distention, abdominal pain, constipation, diarrhea, nausea and vomiting.  Genitourinary: Negative for dysuria and frequency.  Musculoskeletal: Negative for arthralgias, joint swelling and myalgias.  Skin: Negative for rash.  Neurological: Negative for dizziness, syncope, weakness, light-headedness and numbness.  Psychiatric/Behavioral: Negative for behavioral problems, confusion and sleep disturbance.     Immunization History  Administered Date(s) Administered  . Influenza-Unspecified 05/29/2017  . Pneumococcal Conjugate-13 06/21/2017  . Tdap 06/21/2017   Pertinent  Health Maintenance Due  Topic Date Due  . PNA vac Low Risk Adult (2 of 2 - PPSV23) 06/21/2018  . INFLUENZA VACCINE  03/09/2019  . DEXA SCAN  Completed   Fall Risk  04/27/2018 04/25/2017  Falls in the past year? No No   Functional Status Survey:    Vitals:   05/30/19 0949  BP: (!) 147/74  Pulse: 96  Resp: 19  Temp: (!) 97.3 F (36.3 C)  SpO2: 96%  Weight: 146 lb (66.2 kg)  Height: 4\' 11"  (1.499 m)   Body mass index is 29.49 kg/m. Physical Exam  Constitutional:  Well-developed and well-nourished.  HENT:  Head: Normocephalic.  Mouth/Throat: Oropharynx is clear and  moist.  Eyes: Pupils are equal, round, and reactive to light.  Neck: Neck supple.  Cardiovascular: Normal rate and normal heart sounds.  No murmur heard. Pulmonary/Chest: Effort normal and breath sounds normal. No respiratory distress. No wheezes. She has no rales.  Abdominal: Soft. Bowel sounds are normal. No distension. There is no tenderness. There is no rebound.  Musculoskeletal:Bilateral Edema Lymphadenopathy: none Neurological: No Focal deficits Skin: Skin is warm and dry.  Psychiatric: Normal mood and affect. Behavior is normal. Thought content normal.    Labs reviewed: Recent Labs    04/04/19 0051 04/05/19 0213 04/06/19 0226 04/11/19 04/25/19 05/25/19  NA 139 138 137 140 139  --   K 3.7 4.5 4.0 4.2 4.3  --   CL 105 107 105  --   --  106  CO2 22 23 26   --   --  27  GLUCOSE 132* 148* 119*  --   --   --   BUN 13 13 18 10 13   --   CREATININE 0.83 0.83 0.78 0.8 0.8  --   CALCIUM 10.0 9.1 9.2  --   --  10.0   Recent Labs    08/16/18 10/26/18  AST 9* 13  ALT 7 8  ALKPHOS 73  --   PROT 5.9  --   ALBUMIN 3.4  --    Recent Labs    04/05/19 0213 04/06/19 0226 04/07/19 0324 04/11/19 04/25/19  WBC 11.4* 9.0 8.0 7.4 6.3  NEUTROABS  --   --   --   --  4,145  HGB 10.9* 9.4* 8.9* 9.7* 10.3*  HCT 34.7* 30.3* 29.0* 30* 32*  MCV 95.1 94.7 96.0  --   --   PLT 199 170 177 354 369   Lab Results  Component Value Date   TSH 2.43 04/11/2019   Lab Results  Component Value Date   HGBA1C 5.3 11/01/2016   No results found for: CHOL, HDL, LDLCALC, LDLDIRECT, TRIG, CHOLHDL  Significant Diagnostic Results in last 30 days:  No results found.  Assessment/Plan Alzheimer's disease with Behavior issues Will discontinue Norco Continue Depakote same dose Will use Seroquel 12.5 mg PRN for 14 days for these episodes Will consider starting on Namenda  Closed fracture of right hip s/p Repair Tylenol Prn for Pain  Bilateral leg edema Never been on lasix Will reval in few days.  Consider low dose lasix and possible echo     Family/ staff Communication:   Labs/tests ordered:   Total time spent in this patient care encounter was  25_  minutes; greater than 50% of the visit spent counseling patient and staff, reviewing records , Labs and coordinating care for problems addressed at this encounter.

## 2019-05-31 DIAGNOSIS — Z7389 Other problems related to life management difficulty: Secondary | ICD-10-CM | POA: Diagnosis not present

## 2019-05-31 DIAGNOSIS — M25561 Pain in right knee: Secondary | ICD-10-CM | POA: Diagnosis not present

## 2019-05-31 DIAGNOSIS — M6281 Muscle weakness (generalized): Secondary | ICD-10-CM | POA: Diagnosis not present

## 2019-05-31 DIAGNOSIS — S72001D Fracture of unspecified part of neck of right femur, subsequent encounter for closed fracture with routine healing: Secondary | ICD-10-CM | POA: Diagnosis not present

## 2019-05-31 DIAGNOSIS — R2681 Unsteadiness on feet: Secondary | ICD-10-CM | POA: Diagnosis not present

## 2019-05-31 DIAGNOSIS — Z9181 History of falling: Secondary | ICD-10-CM | POA: Diagnosis not present

## 2019-06-03 DIAGNOSIS — M6281 Muscle weakness (generalized): Secondary | ICD-10-CM | POA: Diagnosis not present

## 2019-06-03 DIAGNOSIS — Z9181 History of falling: Secondary | ICD-10-CM | POA: Diagnosis not present

## 2019-06-03 DIAGNOSIS — S72001D Fracture of unspecified part of neck of right femur, subsequent encounter for closed fracture with routine healing: Secondary | ICD-10-CM | POA: Diagnosis not present

## 2019-06-03 DIAGNOSIS — R2681 Unsteadiness on feet: Secondary | ICD-10-CM | POA: Diagnosis not present

## 2019-06-03 DIAGNOSIS — Z7389 Other problems related to life management difficulty: Secondary | ICD-10-CM | POA: Diagnosis not present

## 2019-06-03 DIAGNOSIS — M25561 Pain in right knee: Secondary | ICD-10-CM | POA: Diagnosis not present

## 2019-06-04 DIAGNOSIS — M6281 Muscle weakness (generalized): Secondary | ICD-10-CM | POA: Diagnosis not present

## 2019-06-04 DIAGNOSIS — M25561 Pain in right knee: Secondary | ICD-10-CM | POA: Diagnosis not present

## 2019-06-04 DIAGNOSIS — R2681 Unsteadiness on feet: Secondary | ICD-10-CM | POA: Diagnosis not present

## 2019-06-04 DIAGNOSIS — S72001D Fracture of unspecified part of neck of right femur, subsequent encounter for closed fracture with routine healing: Secondary | ICD-10-CM | POA: Diagnosis not present

## 2019-06-04 DIAGNOSIS — Z9181 History of falling: Secondary | ICD-10-CM | POA: Diagnosis not present

## 2019-06-04 DIAGNOSIS — Z03818 Encounter for observation for suspected exposure to other biological agents ruled out: Secondary | ICD-10-CM | POA: Diagnosis not present

## 2019-06-04 DIAGNOSIS — B351 Tinea unguium: Secondary | ICD-10-CM | POA: Diagnosis not present

## 2019-06-04 DIAGNOSIS — Z7389 Other problems related to life management difficulty: Secondary | ICD-10-CM | POA: Diagnosis not present

## 2019-06-04 DIAGNOSIS — M79671 Pain in right foot: Secondary | ICD-10-CM | POA: Diagnosis not present

## 2019-06-04 DIAGNOSIS — M79672 Pain in left foot: Secondary | ICD-10-CM | POA: Diagnosis not present

## 2019-06-06 DIAGNOSIS — S72001D Fracture of unspecified part of neck of right femur, subsequent encounter for closed fracture with routine healing: Secondary | ICD-10-CM | POA: Diagnosis not present

## 2019-06-06 DIAGNOSIS — Z7389 Other problems related to life management difficulty: Secondary | ICD-10-CM | POA: Diagnosis not present

## 2019-06-06 DIAGNOSIS — M6281 Muscle weakness (generalized): Secondary | ICD-10-CM | POA: Diagnosis not present

## 2019-06-06 DIAGNOSIS — R2681 Unsteadiness on feet: Secondary | ICD-10-CM | POA: Diagnosis not present

## 2019-06-06 DIAGNOSIS — M25561 Pain in right knee: Secondary | ICD-10-CM | POA: Diagnosis not present

## 2019-06-06 DIAGNOSIS — Z9181 History of falling: Secondary | ICD-10-CM | POA: Diagnosis not present

## 2019-06-07 DIAGNOSIS — S72001D Fracture of unspecified part of neck of right femur, subsequent encounter for closed fracture with routine healing: Secondary | ICD-10-CM | POA: Diagnosis not present

## 2019-06-07 DIAGNOSIS — Z9181 History of falling: Secondary | ICD-10-CM | POA: Diagnosis not present

## 2019-06-07 DIAGNOSIS — Z7389 Other problems related to life management difficulty: Secondary | ICD-10-CM | POA: Diagnosis not present

## 2019-06-07 DIAGNOSIS — M25561 Pain in right knee: Secondary | ICD-10-CM | POA: Diagnosis not present

## 2019-06-07 DIAGNOSIS — M6281 Muscle weakness (generalized): Secondary | ICD-10-CM | POA: Diagnosis not present

## 2019-06-07 DIAGNOSIS — R2681 Unsteadiness on feet: Secondary | ICD-10-CM | POA: Diagnosis not present

## 2019-06-17 ENCOUNTER — Non-Acute Institutional Stay (SKILLED_NURSING_FACILITY): Payer: Medicare Other | Admitting: Nurse Practitioner

## 2019-06-17 ENCOUNTER — Encounter: Payer: Self-pay | Admitting: Nurse Practitioner

## 2019-06-17 DIAGNOSIS — F0150 Vascular dementia without behavioral disturbance: Secondary | ICD-10-CM

## 2019-06-17 DIAGNOSIS — G309 Alzheimer's disease, unspecified: Secondary | ICD-10-CM | POA: Diagnosis not present

## 2019-06-17 DIAGNOSIS — M159 Polyosteoarthritis, unspecified: Secondary | ICD-10-CM

## 2019-06-17 DIAGNOSIS — F0280 Dementia in other diseases classified elsewhere without behavioral disturbance: Secondary | ICD-10-CM

## 2019-06-17 DIAGNOSIS — I10 Essential (primary) hypertension: Secondary | ICD-10-CM

## 2019-06-17 DIAGNOSIS — F329 Major depressive disorder, single episode, unspecified: Secondary | ICD-10-CM | POA: Diagnosis not present

## 2019-06-17 NOTE — Assessment & Plan Note (Signed)
Multiple sites, stable, continue Tylenol 650mg  bid.

## 2019-06-17 NOTE — Assessment & Plan Note (Signed)
Continue memory care unit for safety, care assistance, continue to ambulate with walker.

## 2019-06-17 NOTE — Assessment & Plan Note (Signed)
Her mood is stable, continue Trazodone 25mg  qhs, Duloxetine 30mg  qd, Depakote 125mg  bid.

## 2019-06-17 NOTE — Progress Notes (Signed)
Location:   SNF [place] Room Number: 106 Place of Service:  SNF (31) Provider:  Orian Figueira NP  [name], MD  Patient Care Team: [name], MD as PCP - General (Internal Medicine) Martie Muhlbauer X, NP as Nurse Practitioner (Internal Medicine) [name], MD (Internal Medicine)  Extended Emergency Contact Information Primary Emergency Contact: [facility] Address: [address]          [place], [place] [address] [name] of [place] Phone: [phone] Relation: Son  Code Status:  DNR Goals of care: Advanced Directive information Advanced Directives 05/27/2019  Does Patient Have a Medical Advance Directive? Yes  Type of Paramedic of [place];Out of facility DNR (pink MOST or yellow form)  Does patient want to make changes to medical advance directive? No - Patient declined  Copy of [place] in Chart? Yes - validated most recent copy scanned in chart (See row information)  Pre-existing out of facility DNR order (yellow form or pink MOST form) Yellow form placed in chart (order not valid for inpatient use)     Chief Complaint  Patient presents with  . Acute Visit    Agitation, exit seeking    HPI:  Pt is a 85 y.o. female seen today for an acute visit for exit seeking and agitation 06/16/19, Seroquel 12.5mg  x 1 effective. The patient resides in memory care unit, [facility] for safety, care assistance, ambulates with walker. Her mood is stable, on Trazodone 25mg  qd, Duloxetine 30mg  qd, Depakote 125mg  bid. OA, multiple sites, taking Tylenol 650mg  bid. HTN, blood pressure is controlled, on Lisinopril 10mg  qd.    Past Medical History:  Diagnosis Date  . Alzheimer disease ([place]) 10/27/2016  . Arthritis   . Confusion 04/04/2019  . Depression, recurrent ([place]) 10/27/2016  . HTN (hypertension) 10/27/2016  . Hypertension   . Hypertensive kidney disease with CKD (chronic kidney disease) stage V ([place]) 11/02/2016   11/02/16  Na 136, K 4.7, Bun 30, creat 1.52, TSH 0.86, wbc 6.7, Hgb 11.3, plt 264  . Hypothyroidism 10/27/2016  . Osteoarthritis 10/27/2016  . Thyroid disease    Past Surgical History:  Procedure Laterality Date  . ABDOMINAL HYSTERECTOMY  1986  . INTRAMEDULLARY (IM) NAIL INTERTROCHANTERIC Right 04/04/2019   Procedure: INTRAMEDULLARY (IM) NAIL INTERTROCHANTRIC;  Surgeon: Rod Can, MD;  [facility];  Service: Orthopedics;  Laterality: Right;  . KNEE ARTHROSCOPY  2015/2017   Dr. Gustavus Bryant  . SPINE SURGERY  2009, 2011   Dr.Mark Nicoletta Dress    No Known Allergies  Allergies as of 06/17/2019   No Known Allergies     Medication List       Accurate as of June 17, 2019  3:57 PM. If you have any questions, ask your nurse or doctor.        STOP taking these medications   HYDROcodone-acetaminophen 5-325 MG tablet Commonly known as: NORCO/VICODIN Stopped by: Trigger Frasier X Lorieann Argueta, NP     TAKE these medications   acetaminophen 325 MG tablet Commonly known as: TYLENOL Take 650 mg by mouth 2 (two) times daily.   cholecalciferol 25 MCG (1000 UT) tablet Commonly known as: VITAMIN D3 Take 1,000 Units by mouth daily.   divalproex 125 MG capsule Commonly known as: DEPAKOTE SPRINKLE Take 125 mg by mouth 2 (two) times daily.   docusate sodium 100 MG capsule Commonly known as: COLACE Take 1 capsule (100 mg total) by mouth 2 (two) times daily.   DULoxetine 30 MG  capsule Commonly known as: CYMBALTA Take 30 mg by mouth daily.   levothyroxine 25 MCG tablet Commonly known as: SYNTHROID Take 25 mcg by mouth daily before breakfast.   lisinopril 10 MG tablet Commonly known as: ZESTRIL Take 10 mg by mouth daily.   Melatonin 5 MG Tabs Take 5 mg by mouth at bedtime as needed (sleep).   traZODone 50 MG tablet Commonly known as: DESYREL Take 25 mg by mouth at bedtime.   vitamin B-12 1000 MCG tablet Commonly known as: CYANOCOBALAMIN Take 1,000 mcg by mouth daily.   vitamin C 500 MG tablet Commonly  known as: ASCORBIC ACID Take 500 mg by mouth daily.      ROS was provided with assistance of staff.  Review of Systems  Constitutional: Negative for activity change, appetite change, chills, diaphoresis, fatigue, fever and unexpected weight change.  HENT: Positive for hearing loss. Negative for congestion and voice change.   Eyes: Negative for visual disturbance.  Respiratory: Negative for cough, shortness of breath and wheezing.   Cardiovascular: Positive for leg swelling.  Gastrointestinal: Negative for abdominal distention, abdominal pain, constipation, diarrhea, nausea and vomiting.  Genitourinary: Negative for difficulty urinating, dysuria and urgency.  Musculoskeletal: Positive for arthralgias and gait problem.  Skin: Negative for color change and pallor.  Neurological: Negative for dizziness, speech difficulty, weakness and headaches.       Dementia  Psychiatric/Behavioral: Positive for agitation, behavioral problems and confusion. Negative for dysphoric mood, hallucinations and sleep disturbance. The patient is not nervous/anxious.     Immunization History  Administered Date(s) Administered  . Influenza-Unspecified 05/29/2017  . Pneumococcal Conjugate-13 06/21/2017  . Tdap 06/21/2017   Pertinent  Health Maintenance Due  Topic Date Due  . PNA vac Low Risk Adult (2 of 2 - PPSV23) 06/21/2018  . INFLUENZA VACCINE  03/09/2019  . DEXA SCAN  Completed   Fall Risk  04/27/2018 04/25/2017  Falls in the past year? No No   Functional Status Survey:    Vitals:   06/17/19 1503  BP: 132/70  Pulse: 82  Resp: 18  Temp: 97.7 F (36.5 C)  SpO2: 98%  Weight: 143 lb 12.8 oz (65.2 kg)  Height: 4\' 11"  (1.499 m)   Body mass index is 29.04 kg/m. Physical Exam Vitals signs and nursing note reviewed.  Constitutional:      General: She is not in acute distress.    Appearance: Normal appearance. She is not ill-appearing or toxic-appearing.  HENT:     Head: Normocephalic and  atraumatic.     Nose: Nose normal.     Mouth/Throat:     Mouth: Mucous membranes are moist.  Eyes:     Extraocular Movements: Extraocular movements intact.     Conjunctiva/sclera: Conjunctivae normal.     Pupils: Pupils are equal, round, and reactive to light.  Neck:     Musculoskeletal: Normal range of motion and neck supple.  Cardiovascular:     Rate and Rhythm: Normal rate and regular rhythm.     Heart sounds: No murmur.  Pulmonary:     Breath sounds: No wheezing, rhonchi or rales.  Abdominal:     General: Bowel sounds are normal. There is no distension.     Palpations: Abdomen is soft.     Tenderness: There is no abdominal tenderness. There is no right CVA tenderness, left CVA tenderness, guarding or rebound.  Musculoskeletal:     Right lower leg: Edema present.     Left lower leg: Edema present.  Comments: Trace edema BLE LLE>RLE  Skin:    General: Skin is warm and dry.  Neurological:     General: No focal deficit present.     Mental Status: She is alert. Mental status is at baseline.     Cranial Nerves: No cranial nerve deficit.     Motor: No weakness.     Coordination: Coordination normal.     Gait: Gait abnormal.     Comments: Oriented to self.   Psychiatric:        Mood and Affect: Mood normal.     Labs reviewed: Recent Labs    04/04/19 0051 04/05/19 0213 04/06/19 0226 04/11/19 04/25/19 05/25/19  NA 139 138 137 140 139  --   K 3.7 4.5 4.0 4.2 4.3  --   CL 105 107 105  --   --  106  CO2 22 23 26   --   --  27  GLUCOSE 132* 148* 119*  --   --   --   BUN 13 13 18 10 13   --   CREATININE 0.83 0.83 0.78 0.8 0.8  --   CALCIUM 10.0 9.1 9.2  --   --  10.0   Recent Labs    08/16/18 10/26/18  AST 9* 13  ALT 7 8  ALKPHOS 73  --   PROT 5.9  --   ALBUMIN 3.4  --    Recent Labs    04/05/19 0213 04/06/19 0226 04/07/19 0324 04/11/19 04/25/19  WBC 11.4* 9.0 8.0 7.4 6.3  NEUTROABS  --   --   --   --  4,145  HGB 10.9* 9.4* 8.9* 9.7* 10.3*  HCT 34.7* 30.3*  29.0* 30* 32*  MCV 95.1 94.7 96.0  --   --   PLT 199 170 177 354 369   Lab Results  Component Value Date   TSH 2.43 04/11/2019   Lab Results  Component Value Date   HGBA1C 5.3 11/01/2016   No results found for: CHOL, HDL, LDLCALC, LDLDIRECT, TRIG, CHOLHDL  Significant Diagnostic Results in last 30 days:  No results found.  Assessment/Plan Mixed Alzheimer's and vascular dementia ([place]) Continue memory care unit for safety, care assistance, continue to ambulate with walker.   HTN (hypertension) Blood pressure is controlled, continue Lisinopril 10mg  qd.   Osteoarthritis Multiple sites, stable, continue Tylenol 650mg  bid.   Major depression, chronic Her mood is stable, continue Trazodone 25mg  qhs, Duloxetine 30mg  qd, Depakote 125mg  bid.      Family/ staff Communication: plan of care reviewed with the patient and charge nurse.   Labs/tests ordered:  none  Time spend 25 minutes.

## 2019-06-17 NOTE — Assessment & Plan Note (Signed)
Blood pressure is controlled, continue Lisinopril 10mg qd  

## 2019-06-18 ENCOUNTER — Non-Acute Institutional Stay (SKILLED_NURSING_FACILITY): Payer: Medicare Other | Admitting: Internal Medicine

## 2019-06-18 ENCOUNTER — Encounter: Payer: Self-pay | Admitting: Internal Medicine

## 2019-06-18 DIAGNOSIS — I10 Essential (primary) hypertension: Secondary | ICD-10-CM | POA: Diagnosis not present

## 2019-06-18 DIAGNOSIS — G309 Alzheimer's disease, unspecified: Secondary | ICD-10-CM | POA: Diagnosis not present

## 2019-06-18 DIAGNOSIS — F0280 Dementia in other diseases classified elsewhere without behavioral disturbance: Secondary | ICD-10-CM

## 2019-06-18 DIAGNOSIS — R600 Localized edema: Secondary | ICD-10-CM | POA: Diagnosis not present

## 2019-06-18 DIAGNOSIS — D500 Iron deficiency anemia secondary to blood loss (chronic): Secondary | ICD-10-CM

## 2019-06-18 DIAGNOSIS — F0150 Vascular dementia without behavioral disturbance: Secondary | ICD-10-CM | POA: Diagnosis not present

## 2019-06-18 DIAGNOSIS — Z03818 Encounter for observation for suspected exposure to other biological agents ruled out: Secondary | ICD-10-CM | POA: Diagnosis not present

## 2019-06-18 NOTE — Progress Notes (Signed)
Location:    Nursing Home Room Number: 106 Place of Service:  SNF (31) Provider: [name], MD  [name], MD  Patient Care Team: [name], MD as PCP - General (Internal Medicine) Mast, Man X, NP as Nurse Practitioner (Internal Medicine) [name], MD (Internal Medicine)  Extended Emergency Contact Information Primary Emergency Contact: [facility] Address: [address]          [place], [place] [address] [name] of [place] Phone: [phone] Relation: Son  Code Status:  DNR Goals of care: Advanced Directive information Advanced Directives 05/27/2019  Does Patient Have a Medical Advance Directive? Yes  Type of Paramedic of [place];Out of facility DNR (pink MOST or yellow form)  Does patient want to make changes to medical advance directive? No - Patient declined  Copy of [place] in Chart? Yes - validated most recent copy scanned in chart (See row information)  Pre-existing out of facility DNR order (yellow form or pink MOST form) Yellow form placed in chart (order not valid for inpatient use)     Chief Complaint  Patient presents with  . Acute Visit    Behavior issues    HPI:  Pt is a 84 y.o. female seen today for an acute visit for Continued Behavior Issues with Inability to perform care for the patient  Patient has h/o Hypertension, Hypothyroidism, Alzheimer's Dementia with behavior issues, Depression, B12 def, S/P Right Hip fracture with Intramedullary Fixation in 8/27 She lives in Memory Unit Has been having behavior issues. Including resisting care and trying to escape the unit Nurses wanted something for her behavior Patient would not let me examine today She said she is fine and does not need anything   Past Medical History:  Diagnosis Date  . Alzheimer disease ([place]) 10/27/2016  . Arthritis   . Confusion 04/04/2019  . Depression, recurrent ([place]) 10/27/2016  . HTN (hypertension)  10/27/2016  . Hypertension   . Hypertensive kidney disease with CKD (chronic kidney disease) stage V ([place]) 11/02/2016   11/02/16 Na 136, K 4.7, Bun 30, creat 1.52, TSH 0.86, wbc 6.7, Hgb 11.3, plt 264  . Hypothyroidism 10/27/2016  . Osteoarthritis 10/27/2016  . Thyroid disease    Past Surgical History:  Procedure Laterality Date  . ABDOMINAL HYSTERECTOMY  1986  . INTRAMEDULLARY (IM) NAIL INTERTROCHANTERIC Right 04/04/2019   Procedure: INTRAMEDULLARY (IM) NAIL INTERTROCHANTRIC;  Surgeon: Rod Can, MD;  [facility]: [facility];  Service: Orthopedics;  Laterality: Right;  . KNEE ARTHROSCOPY  2015/2017   Dr. Gustavus Bryant  . SPINE SURGERY  2009, 2011   Dr.Mark Nicoletta Dress    No Known Allergies  Allergies as of 06/18/2019   No Known Allergies     Medication List       Accurate as of June 18, 2019  2:03 PM. If you have any questions, ask your nurse or doctor.        acetaminophen 325 MG tablet Commonly known as: TYLENOL Take 650 mg by mouth 2 (two) times daily.   cholecalciferol 25 MCG (1000 UT) tablet Commonly known as: VITAMIN D3 Take 1,000 Units by mouth daily.   divalproex 125 MG capsule Commonly known as: DEPAKOTE SPRINKLE Take 125 mg by mouth 2 (two) times daily.   docusate sodium 100 MG capsule Commonly known as: COLACE Take 1 capsule (100 mg total) by mouth 2 (two) times daily.   DULoxetine 30 MG capsule Commonly known as: CYMBALTA Take 30 mg by mouth daily.  levothyroxine 25 MCG tablet Commonly known as: SYNTHROID Take 25 mcg by mouth daily before breakfast.   lisinopril 10 MG tablet Commonly known as: ZESTRIL Take 10 mg by mouth daily.   Melatonin 5 MG Tabs Take 5 mg by mouth at bedtime as needed (sleep).   traZODone 50 MG tablet Commonly known as: DESYREL Take 25 mg by mouth at bedtime.   vitamin B-12 1000 MCG tablet Commonly known as: CYANOCOBALAMIN Take 1,000 mcg by mouth daily.   vitamin C 500 MG tablet Commonly known as: ASCORBIC ACID Take 500 mg by  mouth daily.       Review of Systems  Unable to perform ROS: Dementia    Immunization History  Administered Date(s) Administered  . Influenza-Unspecified 05/29/2017  . Pneumococcal Conjugate-13 06/21/2017  . Tdap 06/21/2017   Pertinent  Health Maintenance Due  Topic Date Due  . PNA vac Low Risk Adult (2 of 2 - PPSV23) 06/21/2018  . INFLUENZA VACCINE  03/09/2019  . DEXA SCAN  Completed   Fall Risk  04/27/2018 04/25/2017  Falls in the past year? No No   Functional Status Survey:    Vitals:   06/18/19 1400  BP: (!) 168/88  Pulse: 84  Resp: 18  Temp: (!) 97.5 F (36.4 C)  SpO2: 95%  Weight: 143 lb 12.8 oz (65.2 kg)  Height: 4\' 11"  (1.499 m)   Body mass index is 29.04 kg/m. Physical Exam  Constitutional:  Well-developed and well-nourished.  HENT:  Head: Normocephalic.  Mouth/Throat: Oropharynx is clear and moist.  Eyes: Pupils are equal, round, and reactive to light.  Neck: Neck supple.  Cardiovascular: Normal rate and normal heart sounds.  No murmur heard. Pulmonary/Chest: Effort normal and breath sounds normal. No respiratory distress. No wheezes. She has no rales.  Abdominal: Soft. Bowel sounds are normal. No distension. There is no tenderness. There is no rebound.  Musculoskeletal:Mild edema Bialteral Lymphadenopathy: none Neurological:No Focal deficits Walks with the walker Skin: Skin is warm and dry.  Psychiatric: Normal mood and affect. Behavior is normal. Thought content normal.    Labs reviewed: Recent Labs    04/04/19 0051 04/05/19 0213 04/06/19 0226 04/11/19 04/25/19 05/25/19  NA 139 138 137 140 139  --   K 3.7 4.5 4.0 4.2 4.3  --   CL 105 107 105  --   --  106  CO2 22 23 26   --   --  27  GLUCOSE 132* 148* 119*  --   --   --   BUN 13 13 18 10 13   --   CREATININE 0.83 0.83 0.78 0.8 0.8  --   CALCIUM 10.0 9.1 9.2  --   --  10.0   Recent Labs    08/16/18 10/26/18  AST 9* 13  ALT 7 8  ALKPHOS 73  --   PROT 5.9  --   ALBUMIN 3.4  --     Recent Labs    04/05/19 0213 04/06/19 0226 04/07/19 0324 04/11/19 04/25/19  WBC 11.4* 9.0 8.0 7.4 6.3  NEUTROABS  --   --   --   --  4,145  HGB 10.9* 9.4* 8.9* 9.7* 10.3*  HCT 34.7* 30.3* 29.0* 30* 32*  MCV 95.1 94.7 96.0  --   --   PLT 199 170 177 354 369   Lab Results  Component Value Date   TSH 2.43 04/11/2019   Lab Results  Component Value Date   HGBA1C 5.3 11/01/2016   No results found for:  CHOL, HDL, LDLCALC, LDLDIRECT, TRIG, CHOLHDL  Significant Diagnostic Results in last 30 days:  No results found.  Assessment/Plan Alzheimer's disease with Behavior issues Start on Seroquel Prn for 14 days Reval Also Start on Namenda 5mg  BID to see if it will help her behavior Has been on Namzeric before Continue same dose of Depakote  Closed fracture of right hip s/p Repair Tylenol Prn has been good for pain  Bilateral leg edema Much better today Hypertension Some readings of High BP Will consider increasing her Lisinopril Anemia Will need follow up CBC Family/ staff Communication:   Labs/tests ordered:   Total time spent in this patient care encounter was  25_  minutes; greater than 50% of the visit spent counseling patient and staff, reviewing records , Labs and coordinating care for problems addressed at this encounter.

## 2019-06-25 DIAGNOSIS — Z20828 Contact with and (suspected) exposure to other viral communicable diseases: Secondary | ICD-10-CM | POA: Diagnosis not present

## 2019-07-01 ENCOUNTER — Encounter: Payer: Self-pay | Admitting: Nurse Practitioner

## 2019-07-01 DIAGNOSIS — S72141D Displaced intertrochanteric fracture of right femur, subsequent encounter for closed fracture with routine healing: Secondary | ICD-10-CM | POA: Diagnosis not present

## 2019-07-02 ENCOUNTER — Non-Acute Institutional Stay (SKILLED_NURSING_FACILITY): Payer: Medicare Other | Admitting: Nurse Practitioner

## 2019-07-02 DIAGNOSIS — F329 Major depressive disorder, single episode, unspecified: Secondary | ICD-10-CM | POA: Diagnosis not present

## 2019-07-02 DIAGNOSIS — E039 Hypothyroidism, unspecified: Secondary | ICD-10-CM

## 2019-07-02 DIAGNOSIS — G309 Alzheimer's disease, unspecified: Secondary | ICD-10-CM

## 2019-07-02 DIAGNOSIS — F0150 Vascular dementia without behavioral disturbance: Secondary | ICD-10-CM

## 2019-07-02 DIAGNOSIS — F0280 Dementia in other diseases classified elsewhere without behavioral disturbance: Secondary | ICD-10-CM

## 2019-07-02 DIAGNOSIS — M159 Polyosteoarthritis, unspecified: Secondary | ICD-10-CM | POA: Diagnosis not present

## 2019-07-02 DIAGNOSIS — I10 Essential (primary) hypertension: Secondary | ICD-10-CM

## 2019-07-03 ENCOUNTER — Encounter: Payer: Self-pay | Admitting: Nurse Practitioner

## 2019-07-03 NOTE — Assessment & Plan Note (Signed)
Stable, continue Tylenol.  

## 2019-07-03 NOTE — Progress Notes (Signed)
Location:   SNF [place] Room Number: 106 Place of Service:  SNF (31) Provider:  Ardon Franklin NP  [name], MD  Patient Care Team: [name], MD as PCP - General (Internal Medicine) Emberlynn Riggan X, NP as Nurse Practitioner (Internal Medicine) [name], MD (Internal Medicine)  Extended Emergency Contact Information Primary Emergency Contact: [facility] Address: [address]          [place], [place] [address] [name] of [place] Phone: [phone] Relation: Son  Code Status:  DNR Goals of care: Advanced Directive information Advanced Directives 05/27/2019  Does Patient Have a Medical Advance Directive? Yes  Type of Paramedic of [place];Out of facility DNR (pink MOST or yellow form)  Does patient want to make changes to medical advance directive? No - Patient declined  Copy of [place] in Chart? Yes - validated most recent copy scanned in chart (See row information)  Pre-existing out of facility DNR order (yellow form or pink MOST form) Yellow form placed in chart (order not valid for inpatient use)     Chief Complaint  Patient presents with  . Medical Management of Chronic Issues  . Quality Metric Gaps    PPSV23    HPI:  Pt is a 85 y.o. female seen today for medical management of chronic diseases.    The patient resides in Memory care unit, [facility], on Memantine 5mg  bid for memory. Her mood and sundowning behaviors are not well controlled. On prn Quetiapine, Trazodone 25mg  qd, Depakote 12.5mg  bid, Duloxetine 30mg  qd. HTN, blood pressure is controlled, on Lisinopril 10mg  qd. Hypothyroidism, stable, on Levothyroxine 76mcg qd, TSH 2.43 04/11/19. Osteoarthritis, multiple sites, stable, on Tylenol 650mg  bid.    Past Medical History:  Diagnosis Date  . Alzheimer disease ([place]) 10/27/2016  . Arthritis   . Confusion 04/04/2019  . Depression, recurrent ([place]) 10/27/2016  . HTN (hypertension) 10/27/2016  .  Hypertension   . Hypertensive kidney disease with CKD (chronic kidney disease) stage V ([place]) 11/02/2016   11/02/16 Na 136, K 4.7, Bun 30, creat 1.52, TSH 0.86, wbc 6.7, Hgb 11.3, plt 264  . Hypothyroidism 10/27/2016  . Osteoarthritis 10/27/2016  . Thyroid disease    Past Surgical History:  Procedure Laterality Date  . ABDOMINAL HYSTERECTOMY  1986  . INTRAMEDULLARY (IM) NAIL INTERTROCHANTERIC Right 04/04/2019   Procedure: INTRAMEDULLARY (IM) NAIL INTERTROCHANTRIC;  Surgeon: Rod Can, MD;  Location: [facility];  Service: Orthopedics;  Laterality: Right;  . KNEE ARTHROSCOPY  2015/2017   Dr. Gustavus Bryant  . SPINE SURGERY  2009, 2011   Dr.Mark Nicoletta Dress    No Known Allergies  Allergies as of 07/02/2019   No Known Allergies     Medication List       Accurate as of July 02, 2019 11:59 PM. If you have any questions, ask your nurse or doctor.        acetaminophen 325 MG tablet Commonly known as: TYLENOL Take 650 mg by mouth 2 (two) times daily.   cholecalciferol 25 MCG (1000 UT) tablet Commonly known as: VITAMIN D3 Take 1,000 Units by mouth daily.   divalproex 125 MG capsule Commonly known as: DEPAKOTE SPRINKLE Take 125 mg by mouth 2 (two) times daily.   docusate sodium 100 MG capsule Commonly known as: COLACE Take 1 capsule (100 mg total) by mouth 2 (two) times daily.   DULoxetine 30 MG capsule Commonly known as: CYMBALTA Take 30 mg by mouth daily.   levothyroxine  25 MCG tablet Commonly known as: SYNTHROID Take 25 mcg by mouth daily before breakfast.   lisinopril 10 MG tablet Commonly known as: ZESTRIL Take 10 mg by mouth daily.   Melatonin 5 MG Tabs Take 5 mg by mouth at bedtime as needed (sleep).   memantine 5 MG tablet Commonly known as: NAMENDA Take 5 mg by mouth 2 (two) times daily.   QUEtiapine 25 MG tablet Commonly known as: SEROQUEL Take 12.5 mg by mouth at bedtime.   traZODone 50 MG tablet Commonly known as: DESYREL Take 25 mg by mouth at bedtime.    vitamin B-12 1000 MCG tablet Commonly known as: CYANOCOBALAMIN Take 1,000 mcg by mouth daily.   vitamin C 500 MG tablet Commonly known as: ASCORBIC ACID Take 500 mg by mouth daily.      ROS was provided with assistance of staff.  Review of Systems  Constitutional: Negative for activity change, appetite change, chills, diaphoresis, fatigue, fever and unexpected weight change.  HENT: Positive for hearing loss. Negative for congestion and voice change.   Respiratory: Negative for cough, shortness of breath and wheezing.   Cardiovascular: Positive for leg swelling.  Gastrointestinal: Negative for abdominal distention, abdominal pain, constipation, diarrhea, nausea and vomiting.  Genitourinary: Negative for difficulty urinating, dysuria and urgency.  Musculoskeletal: Positive for arthralgias and gait problem.  Skin: Negative for color change and pallor.  Neurological: Negative for dizziness, speech difficulty and weakness.       Dementia  Psychiatric/Behavioral: Positive for agitation, behavioral problems, confusion and sleep disturbance. Negative for dysphoric mood and hallucinations. The patient is nervous/anxious.     Immunization History  Administered Date(s) Administered  . Influenza-Unspecified 05/29/2017  . Pneumococcal Conjugate-13 06/21/2017  . Tdap 06/21/2017   Pertinent  Health Maintenance Due  Topic Date Due  . PNA vac Low Risk Adult (2 of 2 - PPSV23) 06/21/2018  . DEXA SCAN  Completed  . INFLUENZA VACCINE  Discontinued   Fall Risk  04/27/2018 04/25/2017  Falls in the past year? No No   Functional Status Survey:    Vitals:   07/03/19 1339  BP: 120/68  Pulse: 80  Resp: 18  Temp: (!) 97.1 F (36.2 C)  SpO2: 92%  Weight: 149 lb (67.6 kg)  Height: 4\' 11"  (1.499 m)   Body mass index is 30.09 kg/m. Physical Exam Vitals signs and nursing note reviewed.  Constitutional:      General: She is not in acute distress.    Appearance: Normal appearance. She is not  ill-appearing, toxic-appearing or diaphoretic.  HENT:     Head: Normocephalic and atraumatic.     Nose: Nose normal.     Mouth/Throat:     Mouth: Mucous membranes are moist.  Eyes:     Extraocular Movements: Extraocular movements intact.     Conjunctiva/sclera: Conjunctivae normal.     Pupils: Pupils are equal, round, and reactive to light.  Neck:     Musculoskeletal: Normal range of motion and neck supple.  Cardiovascular:     Rate and Rhythm: Normal rate and regular rhythm.     Heart sounds: No murmur.  Pulmonary:     Breath sounds: No wheezing, rhonchi or rales.  Abdominal:     General: Bowel sounds are normal. There is no distension.     Palpations: Abdomen is soft.     Tenderness: There is no abdominal tenderness. There is no right CVA tenderness, left CVA tenderness, guarding or rebound.  Musculoskeletal:     Right lower leg:  Edema present.     Left lower leg: Edema present.     Comments: Trace edema BLE, L>R  Skin:    General: Skin is warm and dry.  Neurological:     General: No focal deficit present.     Mental Status: She is alert. Mental status is at baseline.     Motor: No weakness.     Coordination: Coordination normal.     Gait: Gait abnormal.     Comments: Oriented to self.   Psychiatric:     Comments: Pleasantly confused.      Labs reviewed: Recent Labs    04/04/19 0051 04/05/19 0213 04/06/19 0226 04/11/19 04/25/19 05/25/19  NA 139 138 137 140 139  --   K 3.7 4.5 4.0 4.2 4.3  --   CL 105 107 105  --   --  106  CO2 22 23 26   --   --  27  GLUCOSE 132* 148* 119*  --   --   --   BUN 13 13 18 10 13   --   CREATININE 0.83 0.83 0.78 0.8 0.8  --   CALCIUM 10.0 9.1 9.2  --   --  10.0   Recent Labs    08/16/18 10/26/18  AST 9* 13  ALT 7 8  ALKPHOS 73  --   PROT 5.9  --   ALBUMIN 3.4  --    Recent Labs    04/05/19 0213 04/06/19 0226 04/07/19 0324 04/11/19 04/25/19  WBC 11.4* 9.0 8.0 7.4 6.3  NEUTROABS  --   --   --   --  4,145  HGB 10.9* 9.4*  8.9* 9.7* 10.3*  HCT 34.7* 30.3* 29.0* 30* 32*  MCV 95.1 94.7 96.0  --   --   PLT 199 170 177 354 369   Lab Results  Component Value Date   TSH 2.43 04/11/2019   Lab Results  Component Value Date   HGBA1C 5.3 11/01/2016   No results found for: CHOL, HDL, LDLCALC, LDLDIRECT, TRIG, CHOLHDL  Significant Diagnostic Results in last 30 days:  No results found.  Assessment/Plan Major depression, chronic 07/02/19 the patient does not sleep well at night, noted anxiety, restlessness, exit seeking, difficulty redirecting sometimes in the evenings in setting of sun downing symptoms of dementia. Will continue Cymbalta 30mg  qd, Depakote 125mg  bid, Trazodone 25mg  qhs, will schedule Seroquel 12.5mg  qhs-used 8x/14 days, effective.    Mixed Alzheimer's and vascular dementia ([place]) Continue memory care unit [facility] for safety, care assistance, ambulates with walker, continue Memantine  HTN (hypertension) Blood pressure is controlled, continue Lisinopril.   Hypothyroidism Stable, continue Levothyroxine, last TSH wnl  Osteoarthritis Stable, continue Tylenol.      Family/ staff Communication: plan of care reviewed with the patient and charge nurse.   Labs/tests ordered:  none  Time spend 25 minutes.

## 2019-07-03 NOTE — Assessment & Plan Note (Signed)
Continue memory care unit [facility] for safety, care assistance, ambulates with walker, continue Memantine

## 2019-07-03 NOTE — Assessment & Plan Note (Signed)
Stable, continue Levothyroxine, last TSH wnl

## 2019-07-03 NOTE — Assessment & Plan Note (Signed)
07/02/19 the patient does not sleep well at night, noted anxiety, restlessness, exit seeking, difficulty redirecting sometimes in the evenings in setting of sun downing symptoms of dementia. Will continue Cymbalta 30mg  qd, Depakote 125mg  bid, Trazodone 25mg  qhs, will schedule Seroquel 12.5mg  qhs-used 8x/14 days, effective.

## 2019-07-03 NOTE — Assessment & Plan Note (Signed)
Blood pressure is controlled, continue Lisinopril.  

## 2019-07-23 DIAGNOSIS — Z20828 Contact with and (suspected) exposure to other viral communicable diseases: Secondary | ICD-10-CM | POA: Diagnosis not present

## 2019-07-25 ENCOUNTER — Encounter: Payer: Self-pay | Admitting: Internal Medicine

## 2019-07-25 ENCOUNTER — Non-Acute Institutional Stay (SKILLED_NURSING_FACILITY): Payer: Medicare Other | Admitting: Internal Medicine

## 2019-07-25 DIAGNOSIS — R600 Localized edema: Secondary | ICD-10-CM | POA: Diagnosis not present

## 2019-07-25 DIAGNOSIS — E039 Hypothyroidism, unspecified: Secondary | ICD-10-CM

## 2019-07-25 DIAGNOSIS — I10 Essential (primary) hypertension: Secondary | ICD-10-CM | POA: Diagnosis not present

## 2019-07-25 DIAGNOSIS — F329 Major depressive disorder, single episode, unspecified: Secondary | ICD-10-CM

## 2019-07-25 DIAGNOSIS — F0150 Vascular dementia without behavioral disturbance: Secondary | ICD-10-CM

## 2019-07-25 DIAGNOSIS — G309 Alzheimer's disease, unspecified: Secondary | ICD-10-CM

## 2019-07-25 DIAGNOSIS — F0280 Dementia in other diseases classified elsewhere without behavioral disturbance: Secondary | ICD-10-CM

## 2019-07-25 NOTE — Progress Notes (Signed)
Location:   [place] Room Number: 106 Place of Service:  SNF [phone]) Provider:  DNR  [name], MD  Patient Care Team: [name], MD as PCP - General (Internal Medicine) Mast, Man X, NP as Nurse Practitioner (Internal Medicine) [name], MD (Internal Medicine)  Extended Emergency Contact Information Primary Emergency Contact: [facility] Address: [address]          [place], [place] [address] [name] of [place] Phone: [phone] Relation: Son  Code Status:  DNR Goals of care: Advanced Directive information Advanced Directives 05/27/2019  Does Patient Have a Medical Advance Directive? Yes  Type of Paramedic of [place];Out of facility DNR (pink MOST or yellow form)  Does patient want to make changes to medical advance directive? No - Patient declined  Copy of [place] in Chart? Yes - validated most recent copy scanned in chart (See row information)  Pre-existing out of facility DNR order (yellow form or pink MOST form) Yellow form placed in chart (order not valid for inpatient use)     Chief Complaint  Patient presents with  . Medical Management of Chronic Issues    HPI:  Pt is a 85 y.o. female seen today for medical management of chronic diseases.    Patient has h/o Hypertension, Hypothyroidism, Alzheimer's Dementia with behavior issues, Depression, B12 def, S/P Right Hip fracture with Intramedullary Fixation in 8/27  Patient lives in memory unit.  She continues to have some behavior issues including resisting care and trying to escape the unit But doing little bit better.  She was in a better mood today.   she said does not need anything. Her weight is stable.  She walks with a walker has not had any falls.  Past Medical History:  Diagnosis Date  . Alzheimer disease ([place]) 10/27/2016  . Arthritis   . Confusion 04/04/2019  . Depression, recurrent ([place]) 10/27/2016  . HTN  (hypertension) 10/27/2016  . Hypertension   . Hypertensive kidney disease with CKD (chronic kidney disease) stage V ([place]) 11/02/2016   11/02/16 Na 136, K 4.7, Bun 30, creat 1.52, TSH 0.86, wbc 6.7, Hgb 11.3, plt 264  . Hypothyroidism 10/27/2016  . Osteoarthritis 10/27/2016  . Thyroid disease    Past Surgical History:  Procedure Laterality Date  . ABDOMINAL HYSTERECTOMY  1986  . INTRAMEDULLARY (IM) NAIL INTERTROCHANTERIC Right 04/04/2019   Procedure: INTRAMEDULLARY (IM) NAIL INTERTROCHANTRIC;  Surgeon: Rod Can, MD;  Location: [facility];  Service: Orthopedics;  Laterality: Right;  . KNEE ARTHROSCOPY  2015/2017   Dr. Gustavus Bryant  . SPINE SURGERY  2009, 2011   Dr.Mark Nicoletta Dress    No Known Allergies  Allergies as of 07/25/2019   No Known Allergies     Medication List       Accurate as of July 25, 2019 10:28 AM. If you have any questions, ask your nurse or doctor.        acetaminophen 325 MG tablet Commonly known as: TYLENOL Take 650 mg by mouth 2 (two) times daily.   cholecalciferol 25 MCG (1000 UT) tablet Commonly known as: VITAMIN D3 Take 1,000 Units by mouth daily.   divalproex 125 MG capsule Commonly known as: DEPAKOTE SPRINKLE Take 125 mg by mouth 2 (two) times daily.   docusate sodium 100 MG capsule Commonly known as: COLACE Take 1 capsule (100 mg total) by mouth 2 (two) times daily.   DULoxetine 30 MG capsule Commonly known as: CYMBALTA Take  30 mg by mouth daily.   levothyroxine 25 MCG tablet Commonly known as: SYNTHROID Take 25 mcg by mouth daily before breakfast.   lisinopril 10 MG tablet Commonly known as: ZESTRIL Take 10 mg by mouth daily.   Melatonin 5 MG Tabs Take 5 mg by mouth at bedtime as needed (sleep).   memantine 5 MG tablet Commonly known as: NAMENDA Take 5 mg by mouth 2 (two) times daily.   QUEtiapine 25 MG tablet Commonly known as: SEROQUEL Take 12.5 mg by mouth at bedtime.   traZODone 50 MG tablet Commonly known as: DESYREL Take 25  mg by mouth at bedtime.   vitamin B-12 1000 MCG tablet Commonly known as: CYANOCOBALAMIN Take 1,000 mcg by mouth daily.   vitamin C 500 MG tablet Commonly known as: ASCORBIC ACID Take 500 mg by mouth daily.       Review of Systems  Review of Systems Not reliable due to dementia Constitutional: Negative for activity change, appetite change, chills, diaphoresis, fatigue and fever.  HENT: Negative for mouth sores, postnasal drip, rhinorrhea, sinus pain and sore throat.   Respiratory: Negative for apnea, cough, chest tightness, shortness of breath and wheezing.   Cardiovascular: Negative for chest pain, palpitations and leg swelling.  Gastrointestinal: Negative for abdominal distention, abdominal pain, constipation, diarrhea, nausea and vomiting.  Genitourinary: Negative for dysuria and frequency.  Musculoskeletal: Negative for arthralgias, joint swelling and myalgias.  Skin: Negative for rash.  Neurological: Negative for dizziness, syncope, weakness, light-headedness and numbness.  Psychiatric/Behavioral: Negative for behavioral problems, confusion and sleep disturbance.     Immunization History  Administered Date(s) Administered  . Influenza-Unspecified 05/29/2017  . Pneumococcal Conjugate-13 06/21/2017  . Pneumococcal Polysaccharide-23 07/16/2018  . Tdap 06/21/2017   Pertinent  Health Maintenance Due  Topic Date Due  . DEXA SCAN  Completed  . PNA vac Low Risk Adult  Completed  . INFLUENZA VACCINE  Discontinued   Fall Risk  04/27/2018 04/25/2017  Falls in the past year? No No   Functional Status Survey:    Vitals:   07/25/19 1022  BP: 110/70  Pulse: 80  Resp: 18  Temp: (!) 97.3 F (36.3 C)  SpO2: 95%  Weight: 147 lb (66.7 kg)  Height: 4\' 11"  (1.499 m)   Body mass index is 29.69 kg/m. Physical Exam  Constitutional: . Well-developed and well-nourished.  HENT:  Head: Normocephalic.  Mouth/Throat: Oropharynx is clear and moist.  Eyes: Pupils are equal, round,  and reactive to light.  Neck: Neck supple.  Cardiovascular: Normal rate and normal heart sounds.  No murmur heard. Pulmonary/Chest: Effort normal and breath sounds normal. No respiratory distress. No wheezes. She has no rales.  Abdominal: Soft. Bowel sounds are normal. No distension. There is no tenderness. There is no rebound.  Musculoskeletal:Mild edema Bilateral Lymphadenopathy: none Neurological:No Focal Deficits Skin: Skin is warm and dry.  Psychiatric: Normal mood and affect. Behavior is normal. Thought content normal.    Labs reviewed: Recent Labs    04/04/19 0051 04/05/19 0213 04/06/19 0226 04/11/19 0000 04/25/19 0000 05/25/19 0000  NA 139 138 137 140 139  --   K 3.7 4.5 4.0 4.2 4.3  --   CL 105 107 105  --   --  106  CO2 22 23 26   --   --  27  GLUCOSE 132* 148* 119*  --   --   --   BUN 13 13 18 10 13   --   CREATININE 0.83 0.83 0.78 0.8 0.8  --  CALCIUM 10.0 9.1 9.2  --   --  10.0   Recent Labs    08/16/18 0000 10/26/18 0000  AST 9* 13  ALT 7 8  ALKPHOS 73  --   PROT 5.9  --   ALBUMIN 3.4  --    Recent Labs    04/05/19 0213 04/06/19 0226 04/07/19 0324 04/11/19 0000 04/25/19 0000  WBC 11.4* 9.0 8.0 7.4 6.3  NEUTROABS  --   --   --   --  4,145  HGB 10.9* 9.4* 8.9* 9.7* 10.3*  HCT 34.7* 30.3* 29.0* 30* 32*  MCV 95.1 94.7 96.0  --   --   PLT 199 170 177 354 369   Lab Results  Component Value Date   TSH 2.43 04/11/2019   Lab Results  Component Value Date   HGBA1C 5.3 11/01/2016   No results found for: CHOL, HDL, LDLCALC, LDLDIRECT, TRIG, CHOLHDL  Significant Diagnostic Results in last 30 days:  No results found.  Assessment/Plan Alzheimer's diseasewith Behavior issues Patient is on Namenda for her behavior Will increase the dose to 10 mg BID Continue Depakote Continue Seroquel as patient does have behavior problems especially at night  Closed fracture of right hip s/p Repair Tylenol as needed has been working for Pain She walks pretty  well with the walker Depression On Cymbalta Bilateral leg edema Much better Hypertension Blood pressure has been stable on lisinopril Anemia Hgb Low but stable Repeat Labs due to Depakote Hypothyroidism TSh Normal in 9/20 Vit B12 level check  Family/ staff Communication:   Labs/tests ordered:   Total time spent in this patient care encounter was  25_  minutes; greater than 50% of the visit spent counseling patient and staff, reviewing records , Labs and coordinating care for problems addressed at this encounter.

## 2019-07-30 DIAGNOSIS — Z20828 Contact with and (suspected) exposure to other viral communicable diseases: Secondary | ICD-10-CM | POA: Diagnosis not present

## 2019-07-30 LAB — CBC AND DIFFERENTIAL
HCT: 39 (ref 36–46)
Hemoglobin: 12.7 (ref 12.0–16.0)
Neutrophils Absolute: 3630
Platelets: 232 (ref 150–399)
WBC: 6.1

## 2019-07-30 LAB — COMPREHENSIVE METABOLIC PANEL
Albumin: 3.8 (ref 3.5–5.0)
Globulin: 2.8

## 2019-07-30 LAB — CBC: RBC: 4.42 (ref 3.87–5.11)

## 2019-07-30 LAB — BASIC METABOLIC PANEL
BUN: 17 (ref 4–21)
CO2: 27 — AB (ref 13–22)
Chloride: 107 (ref 99–108)
Creatinine: 0.9 (ref 0.5–1.1)
Glucose: 82
Potassium: 4.5 (ref 3.4–5.3)
Sodium: 143 (ref 137–147)

## 2019-07-30 LAB — HEPATIC FUNCTION PANEL
ALT: 8 (ref 7–35)
AST: 11 — AB (ref 13–35)
Alkaline Phosphatase: 117 (ref 25–125)
Bilirubin, Total: 0.3

## 2019-07-31 LAB — VITAMIN B12: Vitamin B-12: 2000

## 2019-08-05 DIAGNOSIS — Z20828 Contact with and (suspected) exposure to other viral communicable diseases: Secondary | ICD-10-CM | POA: Diagnosis not present

## 2019-08-12 DIAGNOSIS — Z03818 Encounter for observation for suspected exposure to other biological agents ruled out: Secondary | ICD-10-CM | POA: Diagnosis not present

## 2019-08-26 ENCOUNTER — Non-Acute Institutional Stay (SKILLED_NURSING_FACILITY): Payer: Medicare Other | Admitting: Nurse Practitioner

## 2019-08-26 ENCOUNTER — Encounter: Payer: Self-pay | Admitting: Nurse Practitioner

## 2019-08-26 DIAGNOSIS — I10 Essential (primary) hypertension: Secondary | ICD-10-CM | POA: Diagnosis not present

## 2019-08-26 DIAGNOSIS — F0150 Vascular dementia without behavioral disturbance: Secondary | ICD-10-CM | POA: Diagnosis not present

## 2019-08-26 DIAGNOSIS — F0280 Dementia in other diseases classified elsewhere without behavioral disturbance: Secondary | ICD-10-CM

## 2019-08-26 DIAGNOSIS — D500 Iron deficiency anemia secondary to blood loss (chronic): Secondary | ICD-10-CM | POA: Diagnosis not present

## 2019-08-26 DIAGNOSIS — M159 Polyosteoarthritis, unspecified: Secondary | ICD-10-CM | POA: Diagnosis not present

## 2019-08-26 DIAGNOSIS — E538 Deficiency of other specified B group vitamins: Secondary | ICD-10-CM | POA: Diagnosis not present

## 2019-08-26 DIAGNOSIS — G309 Alzheimer's disease, unspecified: Secondary | ICD-10-CM

## 2019-08-26 DIAGNOSIS — F329 Major depressive disorder, single episode, unspecified: Secondary | ICD-10-CM | POA: Diagnosis not present

## 2019-08-26 DIAGNOSIS — E039 Hypothyroidism, unspecified: Secondary | ICD-10-CM

## 2019-08-26 DIAGNOSIS — Z20828 Contact with and (suspected) exposure to other viral communicable diseases: Secondary | ICD-10-CM | POA: Diagnosis not present

## 2019-08-26 NOTE — Assessment & Plan Note (Signed)
Stable, Hgb 12.7 07/30/19

## 2019-08-26 NOTE — Progress Notes (Signed)
Location:   [facility] Room Number: 106 Place of Service:  SNF (31) Provider:  Emelin Dascenzo NP  [name], MD  Patient Care Team: [name], MD as PCP - General (Internal Medicine) Emilyanne Mcgough X, NP as Nurse Practitioner (Internal Medicine) [name], MD (Internal Medicine)  Extended Emergency Contact Information Primary Emergency Contact: [facility] Address: [address]          [place], [place] [address] [name] of [place] Phone: [phone] Relation: Son  Code Status:  DNR Goals of care: Advanced Directive information Advanced Directives 08/26/2019  Does Patient Have a Medical Advance Directive? Yes  Type of Advance Directive Out of facility DNR (pink MOST or yellow form);Living will  Does patient want to make changes to medical advance directive? No - Patient declined  Copy of [place] in Chart? -  Pre-existing out of facility DNR order (yellow form or pink MOST form) Yellow form placed in chart (order not valid for inpatient use)     Chief Complaint  Patient presents with  . Medical Management of Chronic Issues    HPI:  Pt is a 86 y.o. female seen today for medical management of chronic diseases.    The patient resides in Memory care unit [facility] for safety, care assistance, ambulates with walker, on Memantine 10mg  bid for memory, her mood is stable on Depakote 125mg  bid, Cymbalta 30mg  qd, Quetiapine 25mg  qd, Trazodone 25mg  qhs. OA, stable, on Tylenol 650mg  bid. HTN, blood pressure is controlled on Lisinopril 10mg  qd. Hypothyroidism, on Levothyroxine 6mcg qd. Anemia, stable. Taking Vit B12 1024mcg 2x/wk for Vit B12 deficiency.    Past Medical History:  Diagnosis Date  . Alzheimer disease ([place]) 10/27/2016  . Arthritis   . Confusion 04/04/2019  . Depression, recurrent ([place]) 10/27/2016  . HTN (hypertension) 10/27/2016  . Hypertension   . Hypertensive kidney disease with CKD (chronic kidney disease)  stage V ([place]) 11/02/2016   11/02/16 Na 136, K 4.7, Bun 30, creat 1.52, TSH 0.86, wbc 6.7, Hgb 11.3, plt 264  . Hypothyroidism 10/27/2016  . Osteoarthritis 10/27/2016  . Thyroid disease    Past Surgical History:  Procedure Laterality Date  . ABDOMINAL HYSTERECTOMY  1986  . INTRAMEDULLARY (IM) NAIL INTERTROCHANTERIC Right 04/04/2019   Procedure: INTRAMEDULLARY (IM) NAIL INTERTROCHANTRIC;  Surgeon: Rod Can, MD;  Location: [facility];  Service: Orthopedics;  Laterality: Right;  . KNEE ARTHROSCOPY  2015/2017   Dr. Gustavus Bryant  . SPINE SURGERY  2009, 2011   Dr.Mark Nicoletta Dress    No Known Allergies  Allergies as of 08/26/2019   No Known Allergies     Medication List       Accurate as of August 26, 2019  4:44 PM. If you have any questions, ask your nurse or doctor.        acetaminophen 325 MG tablet Commonly known as: TYLENOL Take 650 mg by mouth 2 (two) times daily.   cholecalciferol 25 MCG (1000 UNIT) tablet Commonly known as: VITAMIN D3 Take 1,000 Units by mouth daily.   divalproex 125 MG capsule Commonly known as: DEPAKOTE SPRINKLE Take 125 mg by mouth 2 (two) times daily.   docusate sodium 100 MG capsule Commonly known as: COLACE Take 1 capsule (100 mg total) by mouth 2 (two) times daily.   DULoxetine 30 MG capsule Commonly known as: CYMBALTA Take 30 mg by mouth daily.   levothyroxine 25 MCG tablet Commonly known as: SYNTHROID Take 25 mcg by mouth  daily before breakfast.   lisinopril 10 MG tablet Commonly known as: ZESTRIL Take 10 mg by mouth daily.   Melatonin 5 MG Tabs Take 5 mg by mouth at bedtime as needed (sleep).   memantine 10 MG tablet Commonly known as: NAMENDA Take 10 mg by mouth 2 (two) times daily.   QUEtiapine 25 MG tablet Commonly known as: SEROQUEL Take 25 mg by mouth at bedtime.   traZODone 50 MG tablet Commonly known as: DESYREL Take 25 mg by mouth at bedtime.   vitamin B-12 1000 MCG tablet Commonly known as: CYANOCOBALAMIN Take 1,000 mcg  by mouth. Mondays and Wednesdays   vitamin C 500 MG tablet Commonly known as: ASCORBIC ACID Take 500 mg by mouth daily.      ROS was provided with assistance of staff.  Review of Systems  Constitutional: Negative for activity change, appetite change, chills, diaphoresis, fatigue, fever and unexpected weight change.  HENT: Positive for hearing loss. Negative for congestion and voice change.   Respiratory: Negative for cough, shortness of breath and wheezing.   Cardiovascular: Positive for leg swelling. Negative for chest pain and palpitations.  Gastrointestinal: Negative for abdominal distention, abdominal pain, constipation, diarrhea, nausea and vomiting.  Genitourinary: Negative for difficulty urinating, dysuria and urgency.  Musculoskeletal: Positive for arthralgias and gait problem.  Skin: Negative for color change and pallor.  Neurological: Negative for dizziness, speech difficulty, weakness and headaches.       Dementia  Psychiatric/Behavioral: Positive for confusion. Negative for agitation, behavioral problems, hallucinations and sleep disturbance. The patient is not nervous/anxious.     Immunization History  Administered Date(s) Administered  . Influenza-Unspecified 05/29/2017  . Pneumococcal Conjugate-13 06/21/2017  . Pneumococcal Polysaccharide-23 07/16/2018  . Tdap 06/21/2017   Pertinent  Health Maintenance Due  Topic Date Due  . DEXA SCAN  Completed  . PNA vac Low Risk Adult  Completed  . INFLUENZA VACCINE  Discontinued   Fall Risk  04/27/2018 04/25/2017  Falls in the past year? No No   Functional Status Survey:    Vitals:   08/26/19 0829  BP: 138/80  Pulse: 80  Resp: 18  Temp: 98.6 F (37 C)  SpO2: 94%  Weight: 148 lb 6.4 oz (67.3 kg)  Height: 4\' 11"  (1.499 m)   Body mass index is 29.97 kg/m. Physical Exam Vitals and nursing note reviewed.  Constitutional:      General: She is not in acute distress.    Appearance: Normal appearance. She is obese.  She is not ill-appearing, toxic-appearing or diaphoretic.  HENT:     Head: Normocephalic and atraumatic.     Nose: Nose normal.     Mouth/Throat:     Mouth: Mucous membranes are moist.  Eyes:     Extraocular Movements: Extraocular movements intact.     Conjunctiva/sclera: Conjunctivae normal.     Pupils: Pupils are equal, round, and reactive to light.  Cardiovascular:     Rate and Rhythm: Normal rate and regular rhythm.     Heart sounds: No murmur.  Pulmonary:     Breath sounds: No wheezing, rhonchi or rales.  Abdominal:     General: Bowel sounds are normal. There is no distension.     Palpations: Abdomen is soft.     Tenderness: There is no abdominal tenderness. There is no right CVA tenderness, left CVA tenderness, guarding or rebound.  Musculoskeletal:     Cervical back: Normal range of motion and neck supple.     Right lower leg: Edema present.  Left lower leg: Edema present.     Comments: Trace edema BLE  Skin:    General: Skin is warm and dry.  Neurological:     General: No focal deficit present.     Mental Status: She is alert. Mental status is at baseline.     Motor: No weakness.     Coordination: Coordination normal.     Gait: Gait abnormal.     Comments: Oriented to self.   Psychiatric:        Mood and Affect: Mood normal.        Behavior: Behavior normal.     Comments: Pleasantly confused.      Labs reviewed: Recent Labs    10/26/18 0000 04/04/19 0051 04/04/19 0051 04/05/19 0213 04/05/19 0213 04/06/19 0226 04/11/19 0000 04/25/19 0000 05/25/19 0000 07/30/19 0000  NA   < > 139   < > 138   < > 137 140 139  --  143  K   < > 3.7   < > 4.5   < > 4.0 4.2 4.3  --  4.5  CL  --  105   < > 107   < > 105  --   --  106 107  CO2  --  22   < > 23   < > 26  --   --  27 27*  GLUCOSE  --  132*  --  148*  --  119*  --   --   --   --   BUN   < > 13   < > 13   < > 18 10 13   --  17  CREATININE   < > 0.83   < > 0.83   < > 0.78 0.8 0.8  --  0.9  CALCIUM  --  10.0   <  > 9.1  --  9.2  --   --  10.0  --    < > = values in this interval not displayed.   Recent Labs    10/26/18 0000 07/30/19 0000  AST 13 11*  ALT 8 8  ALKPHOS  --  117  ALBUMIN  --  3.8   Recent Labs    04/05/19 0213 04/05/19 0213 04/06/19 0226 04/06/19 0226 04/07/19 0324 04/07/19 0324 04/11/19 0000 04/25/19 0000 07/30/19 0000  WBC 11.4*   < > 9.0   < > 8.0  --  7.4 6.3 6.1  NEUTROABS  --   --   --   --   --   --   --  4,145 3,630  HGB 10.9*   < > 9.4*   < > 8.9*   < > 9.7* 10.3* 12.7  HCT 34.7*   < > 30.3*   < > 29.0*   < > 30* 32* 39  MCV 95.1  --  94.7  --  96.0  --   --   --   --   PLT 199   < > 170   < > 177   < > 354 369 232   < > = values in this interval not displayed.   Lab Results  Component Value Date   TSH 2.43 04/11/2019   Lab Results  Component Value Date   HGBA1C 5.3 11/01/2016   No results found for: CHOL, HDL, LDLCALC, LDLDIRECT, TRIG, CHOLHDL  Significant Diagnostic Results in last 30 days:  No results found.  Assessment/Plan HTN (hypertension) Blood pressure is controlled, continue Lisinopril.  Mixed Alzheimer's and vascular dementia ([place]) Stable, continue Memory care unit [facility] for safety, care assistance, continue Memantine for memory.   Hypothyroidism Stable, TSH 2.43 04/11/19, continue Levothyroxine 55mcg qd.   Osteoarthritis Stable, multiple sites, continue Tylenol 650mg  bid.   B12 deficiency Stable, continue Vit B12, pending repeated Vit B12 level.   Blood loss anemia Stable, Hgb 12.7 07/30/19  Major depression, chronic Her mood is stable, continue Depakote, Quetiapine, Cymbalta, Trazodone.      Family/ staff Communication: plan of care reviewed with the patient and charge nurse.   Labs/tests ordered:  none  Time spend 25 minutes.

## 2019-08-26 NOTE — Assessment & Plan Note (Signed)
Blood pressure is controlled, continue Lisinopril.  

## 2019-08-26 NOTE — Assessment & Plan Note (Signed)
Her mood is stable, continue Depakote, Quetiapine, Cymbalta, Trazodone.

## 2019-08-26 NOTE — Assessment & Plan Note (Signed)
Stable, continue Memory care unit [name] for safety, care assistance, continue Memantine for memory.

## 2019-08-26 NOTE — Assessment & Plan Note (Addendum)
Stable, continue Vit B12, pending repeated Vit B12 level.

## 2019-08-26 NOTE — Assessment & Plan Note (Signed)
Stable, multiple sites, continue Tylenol 650mg  bid.

## 2019-08-26 NOTE — Assessment & Plan Note (Signed)
Stable, TSH 2.43 04/11/19, continue Levothyroxine 51mcg qd.

## 2019-09-02 DIAGNOSIS — Z20828 Contact with and (suspected) exposure to other viral communicable diseases: Secondary | ICD-10-CM | POA: Diagnosis not present

## 2019-09-03 DIAGNOSIS — Z79899 Other long term (current) drug therapy: Secondary | ICD-10-CM | POA: Diagnosis not present

## 2019-09-03 DIAGNOSIS — I10 Essential (primary) hypertension: Secondary | ICD-10-CM | POA: Diagnosis not present

## 2019-09-03 DIAGNOSIS — E538 Deficiency of other specified B group vitamins: Secondary | ICD-10-CM | POA: Diagnosis not present

## 2019-09-03 DIAGNOSIS — D510 Vitamin B12 deficiency anemia due to intrinsic factor deficiency: Secondary | ICD-10-CM | POA: Diagnosis not present

## 2019-09-03 DIAGNOSIS — E642 Sequelae of vitamin C deficiency: Secondary | ICD-10-CM | POA: Diagnosis not present

## 2019-09-03 DIAGNOSIS — G309 Alzheimer's disease, unspecified: Secondary | ICD-10-CM | POA: Diagnosis not present

## 2019-09-03 LAB — CBC AND DIFFERENTIAL
HCT: 37 (ref 36–46)
Hemoglobin: 12.4 (ref 12.0–16.0)
Platelets: 226 (ref 150–399)
WBC: 7.2

## 2019-09-03 LAB — CBC: RBC: 4.17 (ref 3.87–5.11)

## 2019-09-03 LAB — VITAMIN B12: Vitamin B-12: 1076

## 2019-09-09 DIAGNOSIS — Z20828 Contact with and (suspected) exposure to other viral communicable diseases: Secondary | ICD-10-CM | POA: Diagnosis not present

## 2019-09-16 DIAGNOSIS — Z20828 Contact with and (suspected) exposure to other viral communicable diseases: Secondary | ICD-10-CM | POA: Diagnosis not present

## 2019-09-25 ENCOUNTER — Non-Acute Institutional Stay (SKILLED_NURSING_FACILITY): Payer: Medicare Other | Admitting: Nurse Practitioner

## 2019-09-25 ENCOUNTER — Encounter: Payer: Self-pay | Admitting: Nurse Practitioner

## 2019-09-25 DIAGNOSIS — M159 Polyosteoarthritis, unspecified: Secondary | ICD-10-CM | POA: Diagnosis not present

## 2019-09-25 DIAGNOSIS — F0280 Dementia in other diseases classified elsewhere without behavioral disturbance: Secondary | ICD-10-CM

## 2019-09-25 DIAGNOSIS — I10 Essential (primary) hypertension: Secondary | ICD-10-CM

## 2019-09-25 DIAGNOSIS — E039 Hypothyroidism, unspecified: Secondary | ICD-10-CM | POA: Diagnosis not present

## 2019-09-25 DIAGNOSIS — K5901 Slow transit constipation: Secondary | ICD-10-CM | POA: Diagnosis not present

## 2019-09-25 DIAGNOSIS — F339 Major depressive disorder, recurrent, unspecified: Secondary | ICD-10-CM

## 2019-09-25 DIAGNOSIS — G309 Alzheimer's disease, unspecified: Secondary | ICD-10-CM | POA: Diagnosis not present

## 2019-09-25 DIAGNOSIS — F0150 Vascular dementia without behavioral disturbance: Secondary | ICD-10-CM

## 2019-09-25 NOTE — Assessment & Plan Note (Signed)
Advanced dementia, continue memory care unit [facility] for safety, care assistance, continue Memantine.

## 2019-09-25 NOTE — Progress Notes (Signed)
Location:  [place] Room Number: [place] of Service:  SNF (31) Provider:  Abundio Teuscher Otho Darner, NP   [name], MD  Patient Care Team: [name], MD as PCP - General (Internal Medicine) Riannah Stagner X, NP as Nurse Practitioner (Internal Medicine) [name], MD (Internal Medicine)  Extended Emergency Contact Information Primary Emergency Contact: [facility] Address: [address]          [place], [place] [address] [name] of [place] Phone: [phone] Relation: Son  Code Status:  DNR Goals of care: Advanced Directive information Advanced Directives 09/25/2019  Does Patient Have a Medical Advance Directive? Yes  Type of Advance Directive Living will;Out of facility DNR (pink MOST or yellow form)  Does patient want to make changes to medical advance directive? No - Patient declined  Copy of [place] in Chart? -  Pre-existing out of facility DNR order (yellow form or pink MOST form) Yellow form placed in chart (order not valid for inpatient use)     Chief Complaint  Patient presents with  . Medical Management of Chronic Issues    Routine Visit    HPI:  Pt is a 86 y.o. female seen today for medical management of chronic diseases.    The patient resides in memory care unit [facility] for safety, care assistance, on Memantine 10mg  bid, for memory, her mood is stable on Depakote 125mg  bid, Quetiapine 25mg  qd, Duloxetine 30mg  qd,  Trazodone 25mg  qhs. Hx of HTN, blood pressure is controlled on Lisinopril 10mg  qd. OA, stable, on Tylenol 650mg  bid. Constipation, stable, on Colace bid. Hypothyroidism, on Levothyroxine 27mcg qd.    Past Medical History:  Diagnosis Date  . Alzheimer disease ([place]) 10/27/2016  . Arthritis   . Confusion 04/04/2019  . Depression, recurrent ([place]) 10/27/2016  . HTN (hypertension) 10/27/2016  . Hypertension   . Hypertensive kidney disease with CKD (chronic kidney disease) stage V ([place]) 11/02/2016   11/02/16 Na 136, K 4.7, Bun 30, creat 1.52, TSH 0.86, wbc 6.7, Hgb 11.3, plt 264  . Hypothyroidism 10/27/2016  . Osteoarthritis 10/27/2016  . Thyroid disease    Past Surgical History:  Procedure Laterality Date  . ABDOMINAL HYSTERECTOMY  1986  . INTRAMEDULLARY (IM) NAIL INTERTROCHANTERIC Right 04/04/2019   Procedure: INTRAMEDULLARY (IM) NAIL INTERTROCHANTRIC;  Surgeon: Rod Can, MD;  Location: [facility];  Service: Orthopedics;  Laterality: Right;  . KNEE ARTHROSCOPY  2015/2017   Dr. Gustavus Bryant  . SPINE SURGERY  2009, 2011   Dr.Mark Nicoletta Dress    No Known Allergies  Outpatient Encounter Medications as of 09/25/2019  Medication Sig  . acetaminophen (TYLENOL) 325 MG tablet Take 650 mg by mouth 2 (two) times daily.  . cholecalciferol (VITAMIN D3) 25 MCG (1000 UT) tablet Take 1,000 Units by mouth daily.  . divalproex (DEPAKOTE SPRINKLE) 125 MG capsule Take 125 mg by mouth 2 (two) times daily.  Marland Kitchen docusate sodium (COLACE) 100 MG capsule Take 1 capsule (100 mg total) by mouth 2 (two) times daily.  . DULoxetine (CYMBALTA) 30 MG capsule Take 30 mg by mouth daily.  Marland Kitchen levothyroxine (SYNTHROID, LEVOTHROID) 25 MCG tablet Take 25 mcg by mouth daily before breakfast.  . lisinopril (ZESTRIL) 10 MG tablet Take 10 mg by mouth daily.  . Melatonin 5 MG TABS Take 5 mg by mouth at bedtime.   . memantine (NAMENDA) 10 MG tablet Take 10 mg by mouth 2 (two) times daily.   . QUEtiapine (SEROQUEL) 25 MG tablet  Take 25 mg by mouth at bedtime.   . traZODone (DESYREL) 50 MG tablet Take 25 mg by mouth at bedtime.  . vitamin B-12 (CYANOCOBALAMIN) 1000 MCG tablet Take 1,000 mcg by mouth. Mondays and Wednesdays  . vitamin C (ASCORBIC ACID) 500 MG tablet Take 500 mg by mouth daily.   No facility-administered encounter medications on file as of 09/25/2019.   ROS was provided with assistance of staff.  Review of Systems  Constitutional: Negative for activity change, appetite change, chills, diaphoresis, fatigue, fever and  unexpected weight change.  HENT: Positive for hearing loss. Negative for congestion and voice change.   Respiratory: Negative for cough and shortness of breath.   Cardiovascular: Positive for leg swelling. Negative for palpitations.  Gastrointestinal: Negative for abdominal distention, abdominal pain, constipation, nausea and vomiting.  Genitourinary: Negative for difficulty urinating, dysuria and urgency.  Musculoskeletal: Positive for arthralgias and gait problem.  Skin: Negative for color change.  Neurological: Negative for dizziness, speech difficulty, weakness and headaches.       Dementia  Psychiatric/Behavioral: Positive for confusion. Negative for agitation, behavioral problems and sleep disturbance. The patient is not nervous/anxious.     Immunization History  Administered Date(s) Administered  . Influenza-Unspecified 05/29/2017  . Pneumococcal Conjugate-13 06/21/2017  . Pneumococcal Polysaccharide-23 07/16/2018  . Tdap 06/21/2017   Pertinent  Health Maintenance Due  Topic Date Due  . DEXA SCAN  Completed  . PNA vac Low Risk Adult  Completed  . INFLUENZA VACCINE  Discontinued   Fall Risk  04/27/2018 04/25/2017  Falls in the past year? No No   Functional Status Survey:    Vitals:   09/25/19 1511  BP: 130/80  Pulse: 68  Resp: (!) 24  Temp: (!) 97.5 F (36.4 C)  TempSrc: Oral  SpO2: 95%  Weight: 145 lb 6.4 oz (66 kg)  Height: 4\' 11"  (1.499 m)   Body mass index is 29.37 kg/m. Physical Exam Vitals and nursing note reviewed.  Constitutional:      General: She is not in acute distress.    Appearance: Normal appearance. She is obese. She is not ill-appearing, toxic-appearing or diaphoretic.  HENT:     Head: Normocephalic and atraumatic.     Nose: Nose normal.     Mouth/Throat:     Mouth: Mucous membranes are moist.  Eyes:     Extraocular Movements: Extraocular movements intact.     Conjunctiva/sclera: Conjunctivae normal.     Pupils: Pupils are equal, round,  and reactive to light.  Cardiovascular:     Rate and Rhythm: Normal rate and regular rhythm.     Heart sounds: No murmur.  Pulmonary:     Breath sounds: No wheezing or rales.  Abdominal:     General: Bowel sounds are normal. There is no distension.     Palpations: Abdomen is soft.     Tenderness: There is no abdominal tenderness. There is no guarding or rebound.  Musculoskeletal:     Cervical back: Normal range of motion and neck supple.     Right lower leg: Edema present.     Left lower leg: Edema present.     Comments: Trace edema BLE  Skin:    General: Skin is warm and dry.  Neurological:     General: No focal deficit present.     Mental Status: She is alert. Mental status is at baseline.     Motor: No weakness.     Coordination: Coordination normal.     Gait: Gait abnormal.  Comments: Oriented to self.   Psychiatric:     Comments: Pleasantly confused.      Labs reviewed: Recent Labs    10/26/18 0000 04/04/19 0051 04/04/19 0051 04/05/19 0213 04/05/19 0213 04/06/19 0226 04/11/19 0000 04/25/19 0000 05/25/19 0000 07/30/19 0000  NA   < > 139   < > 138   < > 137 140 139  --  143  K   < > 3.7   < > 4.5   < > 4.0 4.2 4.3  --  4.5  CL  --  105   < > 107   < > 105  --   --  106 107  CO2  --  22   < > 23   < > 26  --   --  27 27*  GLUCOSE  --  132*  --  148*  --  119*  --   --   --   --   BUN   < > 13   < > 13   < > 18 10 13   --  17  CREATININE   < > 0.83   < > 0.83   < > 0.78 0.8 0.8  --  0.9  CALCIUM  --  10.0   < > 9.1  --  9.2  --   --  10.0  --    < > = values in this interval not displayed.   Recent Labs    10/26/18 0000 07/30/19 0000  AST 13 11*  ALT 8 8  ALKPHOS  --  117  ALBUMIN  --  3.8   Recent Labs    04/05/19 0213 04/05/19 0213 04/06/19 0226 04/06/19 0226 04/07/19 0324 04/11/19 0000 04/25/19 0000 07/30/19 0000 09/03/19 0000  WBC 11.4*   < > 9.0   < > 8.0   < > 6.3 6.1 7.2  NEUTROABS  --   --   --   --   --   --  4,145 3,630  --   HGB  10.9*   < > 9.4*   < > 8.9*   < > 10.3* 12.7 12.4  HCT 34.7*   < > 30.3*   < > 29.0*   < > 32* 39 37  MCV 95.1  --  94.7  --  96.0  --   --   --   --   PLT 199   < > 170   < > 177   < > 369 232 226   < > = values in this interval not displayed.   Lab Results  Component Value Date   TSH 2.43 04/11/2019   Lab Results  Component Value Date   HGBA1C 5.3 11/01/2016   No results found for: CHOL, HDL, LDLCALC, LDLDIRECT, TRIG, CHOLHDL  Significant Diagnostic Results in last 30 days:  No results found.  Assessment/Plan HTN (hypertension) Blood pressure is controlled, continue Lisinopril.   Mixed Alzheimer's and vascular dementia ([place]) Advanced dementia, continue memory care unit [facility] for safety, care assistance, continue Memantine.   Hypothyroidism Stable, TSH 2.43 04/11/19, continue Levothyroxine.   Osteoarthritis Stable, continue Tylenol.   Depression, recurrent ([employer]) Her mood is stable, continue Duloxetine, Quetiapine, Depakote, Trazodone.   Slow transit constipation Stable, continue Colace.      Family/ staff Communication: plan of care reviewed with the patient and charge nurse.   Labs/tests ordered:  None  Time spend 25 minutes.

## 2019-09-25 NOTE — Assessment & Plan Note (Signed)
Blood pressure is controlled, continue Lisinopril.  

## 2019-09-25 NOTE — Assessment & Plan Note (Signed)
Stable, TSH 2.43 04/11/19, continue Levothyroxine 

## 2019-09-25 NOTE — Assessment & Plan Note (Signed)
Stable, continue Tylenol.  

## 2019-09-25 NOTE — Assessment & Plan Note (Signed)
Her mood is stable, continue Duloxetine, Quetiapine, Depakote, Trazodone.

## 2019-09-25 NOTE — Assessment & Plan Note (Signed)
Stable, continue Colace.  

## 2019-10-05 DIAGNOSIS — Z23 Encounter for immunization: Secondary | ICD-10-CM | POA: Diagnosis not present

## 2019-10-15 ENCOUNTER — Encounter: Payer: Self-pay | Admitting: Internal Medicine

## 2019-10-15 ENCOUNTER — Non-Acute Institutional Stay (SKILLED_NURSING_FACILITY): Payer: Medicare Other | Admitting: Internal Medicine

## 2019-10-15 DIAGNOSIS — R600 Localized edema: Secondary | ICD-10-CM | POA: Diagnosis not present

## 2019-10-15 DIAGNOSIS — G309 Alzheimer's disease, unspecified: Secondary | ICD-10-CM

## 2019-10-15 DIAGNOSIS — E538 Deficiency of other specified B group vitamins: Secondary | ICD-10-CM

## 2019-10-15 DIAGNOSIS — F0280 Dementia in other diseases classified elsewhere without behavioral disturbance: Secondary | ICD-10-CM | POA: Diagnosis not present

## 2019-10-15 DIAGNOSIS — I10 Essential (primary) hypertension: Secondary | ICD-10-CM

## 2019-10-15 DIAGNOSIS — E039 Hypothyroidism, unspecified: Secondary | ICD-10-CM

## 2019-10-15 DIAGNOSIS — F0150 Vascular dementia without behavioral disturbance: Secondary | ICD-10-CM
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
## 2019-10-16 DIAGNOSIS — Z20828 Contact with and (suspected) exposure to other viral communicable diseases: Secondary | ICD-10-CM | POA: Diagnosis not present

## 2019-10-23 DIAGNOSIS — Z20828 Contact with and (suspected) exposure to other viral communicable diseases: Secondary | ICD-10-CM | POA: Diagnosis not present

## 2019-11-05 ENCOUNTER — Encounter: Payer: Self-pay | Admitting: Internal Medicine

## 2019-11-05 ENCOUNTER — Non-Acute Institutional Stay (SKILLED_NURSING_FACILITY): Payer: Medicare Other | Admitting: Internal Medicine

## 2019-11-05 DIAGNOSIS — E039 Hypothyroidism, unspecified: Secondary | ICD-10-CM

## 2019-11-05 DIAGNOSIS — F0281 Dementia in other diseases classified elsewhere with behavioral disturbance: Secondary | ICD-10-CM | POA: Diagnosis not present

## 2019-11-05 DIAGNOSIS — E538 Deficiency of other specified B group vitamins: Secondary | ICD-10-CM | POA: Diagnosis not present

## 2019-11-05 DIAGNOSIS — R600 Localized edema: Secondary | ICD-10-CM | POA: Diagnosis not present

## 2019-11-05 DIAGNOSIS — G308 Other Alzheimer's disease: Secondary | ICD-10-CM | POA: Diagnosis not present

## 2019-11-05 DIAGNOSIS — I10 Essential (primary) hypertension: Secondary | ICD-10-CM | POA: Diagnosis not present

## 2019-11-05 DIAGNOSIS — F02818 Dementia in other diseases classified elsewhere, unspecified severity, with other behavioral disturbance: Secondary | ICD-10-CM
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
## 2019-11-07 DIAGNOSIS — Z20828 Contact with and (suspected) exposure to other viral communicable diseases: Secondary | ICD-10-CM | POA: Diagnosis not present

## 2019-11-07 DIAGNOSIS — I10 Essential (primary) hypertension: Secondary | ICD-10-CM | POA: Diagnosis not present

## 2019-11-07 DIAGNOSIS — F0391 Unspecified dementia with behavioral disturbance: Secondary | ICD-10-CM | POA: Diagnosis not present

## 2019-11-07 LAB — BASIC METABOLIC PANEL
BUN: 18 (ref 4–21)
CO2: 30 — AB (ref 13–22)
Chloride: 106 (ref 99–108)
Creatinine: 1 (ref 0.5–1.1)
Glucose: 91
Potassium: 4.5 (ref 3.4–5.3)
Sodium: 141 (ref 137–147)

## 2019-11-07 LAB — CBC AND DIFFERENTIAL
HCT: 36 (ref 36–46)
Hemoglobin: 11.8 — AB (ref 12.0–16.0)
Neutrophils Absolute: 2417
Platelets: 221 (ref 150–399)
WBC: 5.3

## 2019-11-07 LAB — CBC: RBC: 3.94 (ref 3.87–5.11)

## 2019-11-07 LAB — COMPREHENSIVE METABOLIC PANEL
Calcium: 10 (ref 8.7–10.7)
GFR calc Af Amer: 57
GFR calc non Af Amer: 50
Globulin: 2.5

## 2019-11-07 LAB — HEPATIC FUNCTION PANEL
ALT: 7 (ref 7–35)
AST: 10 — AB (ref 13–35)
Alkaline Phosphatase: 92 (ref 25–125)

## 2019-11-11 ENCOUNTER — Encounter: Payer: Self-pay | Admitting: Nurse Practitioner

## 2019-11-11 DIAGNOSIS — N1831 Chronic kidney disease, stage 3a: Secondary | ICD-10-CM | POA: Insufficient documentation

## 2019-11-20 ENCOUNTER — Non-Acute Institutional Stay (SKILLED_NURSING_FACILITY): Payer: Medicare Other | Admitting: Nurse Practitioner

## 2019-11-20 ENCOUNTER — Encounter: Payer: Self-pay | Admitting: Nurse Practitioner

## 2019-11-20 DIAGNOSIS — R609 Edema, unspecified: Secondary | ICD-10-CM

## 2019-11-20 DIAGNOSIS — G309 Alzheimer's disease, unspecified: Secondary | ICD-10-CM | POA: Diagnosis not present

## 2019-11-20 DIAGNOSIS — E039 Hypothyroidism, unspecified: Secondary | ICD-10-CM

## 2019-11-20 DIAGNOSIS — F339 Major depressive disorder, recurrent, unspecified: Secondary | ICD-10-CM | POA: Diagnosis not present

## 2019-11-20 DIAGNOSIS — F5105 Insomnia due to other mental disorder: Secondary | ICD-10-CM

## 2019-11-20 DIAGNOSIS — F0150 Vascular dementia without behavioral disturbance: Secondary | ICD-10-CM

## 2019-11-20 DIAGNOSIS — F99 Mental disorder, not otherwise specified: Secondary | ICD-10-CM

## 2019-11-20 DIAGNOSIS — F0280 Dementia in other diseases classified elsewhere without behavioral disturbance: Secondary | ICD-10-CM

## 2019-11-20 DIAGNOSIS — I10 Essential (primary) hypertension: Secondary | ICD-10-CM | POA: Diagnosis not present

## 2019-11-20 DIAGNOSIS — M159 Polyosteoarthritis, unspecified: Secondary | ICD-10-CM

## 2019-11-20 DIAGNOSIS — K5901 Slow transit constipation: Secondary | ICD-10-CM

## 2019-11-20 NOTE — Progress Notes (Signed)
Location:   [facility] Room Number: 106 Place of Service:  SNF (31) Provider: Marlana Latus NP   Patient Care Team: [name], MD as PCP - General (Internal Medicine) Eddie Payette X, NP as Nurse Practitioner (Internal Medicine) [name], MD (Internal Medicine)  Extended Emergency Contact Information Primary Emergency Contact: [facility] Address: [address]          [place], [place] [address] [name] of [place] Phone: [phone] Relation: Son  Code Status:  DNR Goals of care: Advanced Directive information Advanced Directives 11/20/2019  Does Patient Have a Medical Advance Directive? Yes  Type of Advance Directive Living will;Healthcare Power of [place];Out of facility DNR (pink MOST or yellow form)  Does patient want to make changes to medical advance directive? No - Patient declined  Copy of [place] in Chart? Yes - validated most recent copy scanned in chart (See row information)  Pre-existing out of facility DNR order (yellow form or pink MOST form) Yellow form placed in chart (order not valid for inpatient use)     Chief Complaint  Patient presents with  . Medical Management of Chronic Issues    Routine Visit     HPI:  Pt is a 86 y.o. female seen today for medical management of chronic diseases.    The patient resides in memory care unit [facility] for safety, care assistance, ambulates with walker, on Memantine 10mg  bid,for memory, her mood is stable, on Trazodone 50mg  qd, Duloxetine 30mg  qd, Quetiapine 25mg  qd, Depakote 250mg  bid. HTN, blood pressure is controlled on Lisinopril 10mg  qd. Hypothyroidism, stable, on Levothyroxine 51mcg qd. No constipation, taking Colace bid. OA, pain is controlled, taking Tylenol 650mg  bid.    Past Medical History:  Diagnosis Date  . Alzheimer disease ([place]) 10/27/2016  . Arthritis   . Confusion 04/04/2019  . Depression, recurrent ([place]) 10/27/2016  . HTN (hypertension)  10/27/2016  . Hypertension   . Hypertensive kidney disease with CKD (chronic kidney disease) stage V ([place]) 11/02/2016   11/02/16 Na 136, K 4.7, Bun 30, creat 1.52, TSH 0.86, wbc 6.7, Hgb 11.3, plt 264  . Hypothyroidism 10/27/2016  . Osteoarthritis 10/27/2016  . Thyroid disease    Past Surgical History:  Procedure Laterality Date  . ABDOMINAL HYSTERECTOMY  1986  . INTRAMEDULLARY (IM) NAIL INTERTROCHANTERIC Right 04/04/2019   Procedure: INTRAMEDULLARY (IM) NAIL INTERTROCHANTRIC;  Surgeon: Rod Can, MD;  Location: [facility]S;  Service: Orthopedics;  Laterality: Right;  . KNEE ARTHROSCOPY  2015/2017   Dr. Gustavus Bryant  . SPINE SURGERY  2009, 2011   Dr.Mark Nicoletta Dress    No Known Allergies  Allergies as of 11/20/2019   No Known Allergies     Medication List       Accurate as of November 20, 2019  4:40 PM. If you have any questions, ask your nurse or doctor.        acetaminophen 325 MG tablet Commonly known as: TYLENOL Take 650 mg by mouth 2 (two) times daily.   CALCIUM 600 PO Take 1 tablet by mouth daily.   cholecalciferol 25 MCG (1000 UNIT) tablet Commonly known as: VITAMIN D3 Take 1,000 Units by mouth daily.   divalproex 125 MG capsule Commonly known as: DEPAKOTE SPRINKLE Take 250 mg by mouth 2 (two) times daily.   docusate sodium 100 MG capsule Commonly known as: COLACE Take 1 capsule (100 mg total) by mouth 2 (two) times daily.   DULoxetine 30 MG capsule Commonly known  as: CYMBALTA Take 30 mg by mouth daily.   levothyroxine 25 MCG tablet Commonly known as: SYNTHROID Take 25 mcg by mouth daily before breakfast.   lisinopril 10 MG tablet Commonly known as: ZESTRIL Take 10 mg by mouth daily.   memantine 10 MG tablet Commonly known as: NAMENDA Take 10 mg by mouth 2 (two) times daily.   QUEtiapine 25 MG tablet Commonly known as: SEROQUEL Take 25 mg by mouth at bedtime.   traZODone 50 MG tablet Commonly known as: DESYREL Take 50 mg by mouth at bedtime.   vitamin B-12  1000 MCG tablet Commonly known as: CYANOCOBALAMIN Take 1,000 mcg by mouth. Mondays and Wednesdays   vitamin C 500 MG tablet Commonly known as: ASCORBIC ACID Take 500 mg by mouth daily.       Review of Systems  Constitutional: Negative for activity change, appetite change, fever and unexpected weight change.  HENT: Positive for hearing loss. Negative for congestion and voice change.   Eyes: Negative for visual disturbance.  Respiratory: Negative for cough and shortness of breath.   Cardiovascular: Positive for leg swelling. Negative for palpitations.  Gastrointestinal: Negative for abdominal distention, abdominal pain and constipation.  Genitourinary: Negative for difficulty urinating, dysuria and urgency.  Musculoskeletal: Positive for arthralgias and gait problem.  Skin: Negative for color change.  Neurological: Negative for speech difficulty, weakness, light-headedness and headaches.       Dementia  Psychiatric/Behavioral: Positive for confusion. Negative for agitation, behavioral problems and sleep disturbance. The patient is not nervous/anxious.     Immunization History  Administered Date(s) Administered  . Influenza-Unspecified 05/29/2017  . Moderna SARS-COVID-2 Vaccination 09/07/2019, 10/05/2019  . Pneumococcal Conjugate-13 06/21/2017  . Pneumococcal Polysaccharide-23 07/16/2018  . Tdap 06/21/2017   Pertinent  Health Maintenance Due  Topic Date Due  . DEXA SCAN  Completed  . PNA vac Low Risk Adult  Completed  . INFLUENZA VACCINE  Discontinued   Fall Risk  04/27/2018 04/25/2017  Falls in the past year? No No   Functional Status Survey:    Vitals:   11/20/19 1442  BP: 128/60  Pulse: 68  Resp: 18  Temp: 97.8 F (36.6 C)  SpO2: 99%  Weight: 153 lb (69.4 kg)  Height: 4\' 11"  (1.499 m)   Body mass index is 30.9 kg/m. Physical Exam Vitals and nursing note reviewed.  Constitutional:      General: She is not in acute distress.    Appearance: Normal appearance.  She is not ill-appearing, toxic-appearing or diaphoretic.  HENT:     Head: Normocephalic and atraumatic.     Mouth/Throat:     Mouth: Mucous membranes are moist.  Eyes:     Extraocular Movements: Extraocular movements intact.     Conjunctiva/sclera: Conjunctivae normal.     Pupils: Pupils are equal, round, and reactive to light.  Cardiovascular:     Rate and Rhythm: Normal rate and regular rhythm.     Heart sounds: No murmur.  Pulmonary:     Effort: Pulmonary effort is normal.     Breath sounds: No rales.  Abdominal:     General: Bowel sounds are normal. There is no distension.     Palpations: Abdomen is soft.     Tenderness: There is no abdominal tenderness.  Musculoskeletal:     Cervical back: Normal range of motion and neck supple.     Right lower leg: Edema present.     Left lower leg: Edema present.     Comments: Trace edema BLE  Skin:    General: Skin is warm and dry.  Neurological:     General: No focal deficit present.     Mental Status: She is alert. Mental status is at baseline.     Gait: Gait abnormal.     Comments: Oriented to self.   Psychiatric:     Comments: Pleasantly confused.      Labs reviewed: Recent Labs    04/04/19 0051 04/04/19 0051 04/05/19 0213 04/05/19 0213 04/06/19 0226 04/11/19 0000 04/25/19 0000 05/25/19 0000 07/30/19 0000 11/07/19 0000  NA 139   < > 138   < > 137   < > 139  --  143 141  K 3.7   < > 4.5   < > 4.0   < > 4.3  --  4.5 4.5  CL 105   < > 107   < > 105  --   --  106 107 106  CO2 22   < > 23   < > 26  --   --  27 27* 30*  GLUCOSE 132*  --  148*  --  119*  --   --   --   --   --   BUN 13   < > 13   < > 18   < > 13  --  17 18  CREATININE 0.83   < > 0.83   < > 0.78   < > 0.8  --  0.9 1.0  CALCIUM 10.0   < > 9.1   < > 9.2  --   --  10.0  --  10.0   < > = values in this interval not displayed.   Recent Labs    07/30/19 0000 11/07/19 0000  AST 11* 10*  ALT 8 7  ALKPHOS 117 92  ALBUMIN 3.8  --    Recent Labs     04/05/19 0213 04/05/19 0213 04/06/19 0226 04/06/19 0226 04/07/19 0324 04/11/19 0000 04/25/19 0000 04/25/19 0000 07/30/19 0000 09/03/19 0000 11/07/19 0000  WBC 11.4*   < > 9.0   < > 8.0   < > 6.3   < > 6.1 7.2 5.3  NEUTROABS  --   --   --   --   --   --  4,145  --  3,630  --  2,417  HGB 10.9*   < > 9.4*   < > 8.9*   < > 10.3*   < > 12.7 12.4 11.8*  HCT 34.7*   < > 30.3*   < > 29.0*   < > 32*   < > 39 37 36  MCV 95.1  --  94.7  --  96.0  --   --   --   --   --   --   PLT 199   < > 170   < > 177   < > 369   < > 232 226 221   < > = values in this interval not displayed.   Lab Results  Component Value Date   TSH 2.43 04/11/2019   Lab Results  Component Value Date   HGBA1C 5.3 11/01/2016   No results found for: CHOL, HDL, LDLCALC, LDLDIRECT, TRIG, CHOLHDL  Significant Diagnostic Results in last 30 days:  No results found.  Assessment/Plan  HTN (hypertension) Blood pressure is controlled, continue Lisinopril.   Mixed Alzheimer's and vascular dementia ([place]) Continue memory care unit [facility] for safety, care assistance, ambulating with walker. Continue Memantine  for memory.  Slow transit constipation Stable, continue Colace.   Hypothyroidism Stable, continue Levothyroxine 79mcg qd, TSH 2.43 04/2019  Osteoarthritis In general, pain is controlled, continue Tylenol.   Depression, recurrent ([place]) Her mood is stable, continue Trazodone, Quetiapine, Depakote, Duloxetine.   Edema Trace in BLE  Insomnia Improved.    Family/ staff Communication: plan of care reviewed with the patient and charge nurse.   Labs/tests ordered: none  Time spend 25 minutes.

## 2019-11-20 NOTE — Assessment & Plan Note (Signed)
Trace in BLE

## 2019-11-20 NOTE — Assessment & Plan Note (Signed)
Stable, continue Colace.  

## 2019-11-20 NOTE — Assessment & Plan Note (Signed)
Blood pressure is controlled, continue Lisinopril.  

## 2019-11-20 NOTE — Assessment & Plan Note (Signed)
In general, pain is controlled, continue Tylenol.

## 2019-11-20 NOTE — Assessment & Plan Note (Signed)
Improved

## 2019-11-20 NOTE — Assessment & Plan Note (Signed)
Stable, continue Levothyroxine 63mcg qd, TSH 2.43 04/2019

## 2019-11-20 NOTE — Assessment & Plan Note (Signed)
Her mood is stable, continue Trazodone, Quetiapine, Depakote, Duloxetine.

## 2019-11-20 NOTE — Assessment & Plan Note (Signed)
Continue memory care unit [facility] for safety, care assistance, ambulating with walker. Continue Memantine for memory.

## 2019-11-22 ENCOUNTER — Non-Acute Institutional Stay (SKILLED_NURSING_FACILITY): Payer: Medicare Other | Admitting: Internal Medicine

## 2019-11-22 ENCOUNTER — Encounter: Payer: Self-pay | Admitting: Internal Medicine

## 2019-11-22 DIAGNOSIS — F0280 Dementia in other diseases classified elsewhere without behavioral disturbance: Secondary | ICD-10-CM

## 2019-11-22 DIAGNOSIS — E039 Hypothyroidism, unspecified: Secondary | ICD-10-CM

## 2019-11-22 DIAGNOSIS — F0150 Vascular dementia without behavioral disturbance: Secondary | ICD-10-CM

## 2019-11-22 DIAGNOSIS — R600 Localized edema: Secondary | ICD-10-CM | POA: Diagnosis not present

## 2019-11-22 DIAGNOSIS — G309 Alzheimer's disease, unspecified: Secondary | ICD-10-CM

## 2019-11-22 DIAGNOSIS — I10 Essential (primary) hypertension: Secondary | ICD-10-CM

## 2019-11-22 MED ORDER — LORAZEPAM 1 MG PO TABS: 1.0000 mg | ORAL_TABLET | Freq: Every evening | ORAL | 1 refills | Status: DC | PRN

## 2019-11-22 NOTE — Progress Notes (Signed)
Location:   [facility] Room Number: 106 Place of Service:  SNF [phone]) Provider:  Veleta Miners, MD   Patient Care Team: [name], MD as PCP - General (Internal Medicine) Mast, Man X, NP as Nurse Practitioner (Internal Medicine) [name], MD (Internal Medicine)  Extended Emergency Contact Information Primary Emergency Contact: [facility] Address: [address]          [place], [place] [address] [name] of [place] Phone: [phone] Relation: Son  Code Status:  DNR Goals of care: Advanced Directive information Advanced Directives 11/22/2019  Does Patient Have a Medical Advance Directive? Yes  Type of Paramedic of [place];Living will;Out of facility DNR (pink MOST or yellow form)  Does patient want to make changes to medical advance directive? No - Patient declined  Copy of [place] in Chart? Yes - validated most recent copy scanned in chart (See row information)  Pre-existing out of facility DNR order (yellow form or pink MOST form) Yellow form placed in chart (order not valid for inpatient use)     Chief Complaint  Patient presents with  . Acute Visit    Behavioral Issues/Insomnia     HPI:  Pt is a 86 y.o. female seen today for an acute visit behavior issues and not sleeping for past 24 hours  Patient has h/o Hypertension, Hypothyroidism, Alzheimer's Dementia with behavior issues, Depression, B12 def, S/P Right Hip fracture with Intramedullary Fixation in 8/27 Patient lives in a memory unit.   She goes somedays and she does not sleep through the night.  Tries to leave the facility gets very agitated with the nurses.  I had increased her Depakote to 250 twice daily.  She is also on Seroquel and trazodone.  She did get better for few days but has had behavior issues since yesterday.   She was sitting comfortably in the living room.  She said she did not sleep because she was worried about  her kids  She has not had any fever chills cough shortness of breath or dysuria.  She has been eating good No nausea vomiting Walking comfortably with her walker.    Past Medical History:  Diagnosis Date  . Alzheimer disease ([place]) 10/27/2016  . Arthritis   . Confusion 04/04/2019  . Depression, recurrent ([place]) 10/27/2016  . HTN (hypertension) 10/27/2016  . Hypertension   . Hypertensive kidney disease with CKD (chronic kidney disease) stage V ([place]) 11/02/2016   11/02/16 Na 136, K 4.7, Bun 30, creat 1.52, TSH 0.86, wbc 6.7, Hgb 11.3, plt 264  . Hypothyroidism 10/27/2016  . Osteoarthritis 10/27/2016  . Thyroid disease    Past Surgical History:  Procedure Laterality Date  . ABDOMINAL HYSTERECTOMY  1986  . INTRAMEDULLARY (IM) NAIL INTERTROCHANTERIC Right 04/04/2019   Procedure: INTRAMEDULLARY (IM) NAIL INTERTROCHANTRIC;  Surgeon: Rod Can, MD;  Location: [facility];  Service: Orthopedics;  Laterality: Right;  . KNEE ARTHROSCOPY  2015/2017   Dr. Gustavus Bryant  . SPINE SURGERY  2009, 2011   Dr.Mark Nicoletta Dress    No Known Allergies  Allergies as of 11/22/2019   No Known Allergies     Medication List       Accurate as of November 22, 2019  2:44 PM. If you have any questions, ask your nurse or doctor.        acetaminophen 325 MG tablet Commonly known as: TYLENOL Take 650 mg by mouth 2 (two) times daily.   CALCIUM 600 PO Take  1 tablet by mouth daily.   cholecalciferol 25 MCG (1000 UNIT) tablet Commonly known as: VITAMIN D3 Take 1,000 Units by mouth daily.   divalproex 250 MG DR tablet Commonly known as: DEPAKOTE Take 250 mg by mouth in the morning and at bedtime. What changed: Another medication with the same name was removed. Continue taking this medication, and follow the directions you see here. Changed by: [name], MD   docusate sodium 100 MG capsule Commonly known as: COLACE Take 1 capsule (100 mg total) by mouth 2 (two) times daily.   DULoxetine 30 MG capsule Commonly  known as: CYMBALTA Take 30 mg by mouth daily.   levothyroxine 25 MCG tablet Commonly known as: SYNTHROID Take 25 mcg by mouth daily before breakfast.   lisinopril 10 MG tablet Commonly known as: ZESTRIL Take 10 mg by mouth daily.   memantine 10 MG tablet Commonly known as: NAMENDA Take 10 mg by mouth 2 (two) times daily.   QUEtiapine 25 MG tablet Commonly known as: SEROQUEL Take 25 mg by mouth at bedtime.   traZODone 50 MG tablet Commonly known as: DESYREL Take 50 mg by mouth at bedtime.   vitamin B-12 1000 MCG tablet Commonly known as: CYANOCOBALAMIN Take 1,000 mcg by mouth. Mondays and Wednesdays   vitamin C 500 MG tablet Commonly known as: ASCORBIC ACID Take 500 mg by mouth daily.       Review of Systems  Unable to perform ROS: Dementia    Immunization History  Administered Date(s) Administered  . Influenza-Unspecified 05/29/2017  . Moderna SARS-COVID-2 Vaccination 09/07/2019, 10/05/2019  . Pneumococcal Conjugate-13 06/21/2017  . Pneumococcal Polysaccharide-23 07/16/2018  . Tdap 06/21/2017   Pertinent  Health Maintenance Due  Topic Date Due  . DEXA SCAN  Completed  . PNA vac Low Risk Adult  Completed  . INFLUENZA VACCINE  Discontinued   Fall Risk  04/27/2018 04/25/2017  Falls in the past year? No No   Functional Status Survey:    Vitals:   11/22/19 1429  BP: (!) 170/90  Pulse: 83  Resp: 20  Temp: 98.6 F (37 C)  SpO2: 95%  Weight: 153 lb (69.4 kg)  Height: 4\' 11"  (1.499 m)   Body mass index is 30.9 kg/m. Physical Exam Vitals reviewed.  Constitutional:      Appearance: Normal appearance.  HENT:     Head: Normocephalic.     Nose: Nose normal.     Mouth/Throat:     Mouth: Mucous membranes are moist.     Pharynx: Oropharynx is clear.  Eyes:     Pupils: Pupils are equal, round, and reactive to light.  Cardiovascular:     Rate and Rhythm: Normal rate.     Pulses: Normal pulses.  Pulmonary:     Effort: Pulmonary effort is normal.      Breath sounds: Normal breath sounds.  Abdominal:     General: Abdomen is flat. Bowel sounds are normal.     Palpations: Abdomen is soft.  Musculoskeletal:        General: No swelling.     Cervical back: Neck supple.  Skin:    General: Skin is warm.  Neurological:     General: No focal deficit present.     Mental Status: She is alert.  Psychiatric:        Mood and Affect: Mood normal.     Labs reviewed: Recent Labs    04/04/19 0051 04/04/19 0051 04/05/19 0213 04/05/19 0213 04/06/19 0226 04/11/19 0000 04/25/19 0000  05/25/19 0000 07/30/19 0000 11/07/19 0000  NA 139   < > 138   < > 137   < > 139  --  143 141  K 3.7   < > 4.5   < > 4.0   < > 4.3  --  4.5 4.5  CL 105   < > 107   < > 105  --   --  106 107 106  CO2 22   < > 23   < > 26  --   --  27 27* 30*  GLUCOSE 132*  --  148*  --  119*  --   --   --   --   --   BUN 13   < > 13   < > 18   < > 13  --  17 18  CREATININE 0.83   < > 0.83   < > 0.78   < > 0.8  --  0.9 1.0  CALCIUM 10.0   < > 9.1   < > 9.2  --   --  10.0  --  10.0   < > = values in this interval not displayed.   Recent Labs    07/30/19 0000 11/07/19 0000  AST 11* 10*  ALT 8 7  ALKPHOS 117 92  ALBUMIN 3.8  --    Recent Labs    04/05/19 0213 04/05/19 0213 04/06/19 0226 04/06/19 0226 04/07/19 0324 04/11/19 0000 04/25/19 0000 04/25/19 0000 07/30/19 0000 09/03/19 0000 11/07/19 0000  WBC 11.4*   < > 9.0   < > 8.0   < > 6.3   < > 6.1 7.2 5.3  NEUTROABS  --   --   --   --   --   --  4,145  --  3,630  --  2,417  HGB 10.9*   < > 9.4*   < > 8.9*   < > 10.3*   < > 12.7 12.4 11.8*  HCT 34.7*   < > 30.3*   < > 29.0*   < > 32*   < > 39 37 36  MCV 95.1  --  94.7  --  96.0  --   --   --   --   --   --   PLT 199   < > 170   < > 177   < > 369   < > 232 226 221   < > = values in this interval not displayed.   Lab Results  Component Value Date   TSH 2.43 04/11/2019   Lab Results  Component Value Date   HGBA1C 5.3 11/01/2016   No results found for: CHOL,  HDL, LDLCALC, LDLDIRECT, TRIG, CHOLHDL  Significant Diagnostic Results in last 30 days:  No results found.  Assessment/Plan  Alzheimer's disease of other onset with behavioral disturbance  Labs done few weeks ago were normal We will continue Depakote 250 twice daily Continue Seroquel and trazodone Will start her on Ativan 1 mg nightly as needed. We will also write for in facility psychologist to see her Patient already on Cymbalta and Namenda    Essential hypertension Blood pressure mildly elevated today but she has not been sleeping We will continue only on lisinopril for now Hypothyroidism TSH normal 920 Bilateral leg edema Stable B12 deficiency Normal Level in 12/20 Family/ staff Communication:   Labs/tests ordered:

## 2019-12-11 ENCOUNTER — Encounter: Payer: Self-pay | Admitting: Nurse Practitioner

## 2019-12-11 ENCOUNTER — Non-Acute Institutional Stay (SKILLED_NURSING_FACILITY): Payer: Medicare Other | Admitting: Nurse Practitioner

## 2019-12-11 DIAGNOSIS — K5901 Slow transit constipation: Secondary | ICD-10-CM

## 2019-12-11 DIAGNOSIS — F0150 Vascular dementia without behavioral disturbance: Secondary | ICD-10-CM | POA: Diagnosis not present

## 2019-12-11 DIAGNOSIS — F0280 Dementia in other diseases classified elsewhere without behavioral disturbance: Secondary | ICD-10-CM | POA: Diagnosis not present

## 2019-12-11 DIAGNOSIS — G309 Alzheimer's disease, unspecified: Secondary | ICD-10-CM

## 2019-12-11 DIAGNOSIS — I10 Essential (primary) hypertension: Secondary | ICD-10-CM

## 2019-12-11 DIAGNOSIS — M159 Polyosteoarthritis, unspecified: Secondary | ICD-10-CM | POA: Diagnosis not present

## 2019-12-11 DIAGNOSIS — E039 Hypothyroidism, unspecified: Secondary | ICD-10-CM | POA: Diagnosis not present

## 2019-12-11 DIAGNOSIS — F339 Major depressive disorder, recurrent, unspecified: Secondary | ICD-10-CM

## 2019-12-11 NOTE — Assessment & Plan Note (Signed)
Blood pressure is controlled, continue Lisinopril.  

## 2019-12-11 NOTE — Assessment & Plan Note (Signed)
Stable, continue memory care unit for safety, care needs, continue Memantine for memory.

## 2019-12-11 NOTE — Assessment & Plan Note (Signed)
Her mood is stable, continue Depakote 250mg  bid, Duloxetine 30mg  qd, Quetiapine 25mg  qd, Trazodone 50mg  qd, prn Lorazepam.

## 2019-12-11 NOTE — Progress Notes (Signed)
Location:   SNF [place] Room Number: 106-A Place of Service:  SNF (31) Provider: [facility] Leeyah Heather NP  [name], MD  Patient Care Team: [name], MD as PCP - General (Internal Medicine) Arsen Mangione X, NP as Nurse Practitioner (Internal Medicine) [name], MD (Internal Medicine)  Extended Emergency Contact Information Primary Emergency Contact: [facility] Address: [address]          [place], [place] [address] [name] of [place] Phone: [phone] Relation: Son  Code Status:  DNR Goals of care: Advanced Directive information Advanced Directives 12/11/2019  Does Patient Have a Medical Advance Directive? Yes  Type of Advance Directive Living will;Out of facility DNR (pink MOST or yellow form)  Does patient want to make changes to medical advance directive? No - Patient declined  Copy of [place] in Chart? -  Pre-existing out of facility DNR order (yellow form or pink MOST form) Yellow form placed in chart (order not valid for inpatient use)     Chief Complaint  Patient presents with  . Medical Management of Chronic Issues    Routine [facility] SN[facility] visit    HPI:  Pt is a 86 y.o. female seen today for medical management of chronic diseases.  The patient resides in Memory care unit [facility] for safety, care assistance,  On Memantine 10mg  bid for memory, her mood/sleep stable, on Depakote 250mg  bid, Duloxetine 30mg  qd, Quetiapine 25mg  qd, Trazodone 50mg  qd, prn Lorazepam. OA general, pain is controlled, on Tylenol 650mg  bid. Constipation, stable, on Colace bid. HTN, blood pressure is controlled, on Lisinopril 10mg  qd. Hypothyroidism, stable, on Levothyroxine 65mcg qd.    Past Medical History:  Diagnosis Date  . Alzheimer disease ([place]) 10/27/2016  . Arthritis   . Confusion 04/04/2019  . Depression, recurrent ([place]) 10/27/2016  . HTN (hypertension) 10/27/2016  . Hypertension   . Hypertensive kidney disease with CKD  (chronic kidney disease) stage V ([place]) 11/02/2016   11/02/16 Na 136, K 4.7, Bun 30, creat 1.52, TSH 0.86, wbc 6.7, Hgb 11.3, plt 264  . Hypothyroidism 10/27/2016  . Osteoarthritis 10/27/2016  . Thyroid disease    Past Surgical History:  Procedure Laterality Date  . ABDOMINAL HYSTERECTOMY  1986  . INTRAMEDULLARY (IM) NAIL INTERTROCHANTERIC Right 04/04/2019   Procedure: INTRAMEDULLARY (IM) NAIL INTERTROCHANTRIC;  Surgeon: Rod Can, MD;  Location: [facility];  Service: Orthopedics;  Laterality: Right;  . KNEE ARTHROSCOPY  2015/2017   Dr. Gustavus Bryant  . SPINE SURGERY  2009, 2011   Dr.Mark Nicoletta Dress    No Known Allergies  Allergies as of 12/11/2019   No Known Allergies     Medication List       Accurate as of Dec 11, 2019 11:59 PM. If you have any questions, ask your nurse or doctor.        STOP taking these medications   LORazepam 1 MG tablet Commonly known as: ATIVAN Stopped by: Terrilyn Tyner X Murad Staples, NP     TAKE these medications   acetaminophen 325 MG tablet Commonly known as: TYLENOL Take 650 mg by mouth 2 (two) times daily.   CALCIUM 600 PO Take 1 tablet by mouth daily.   cholecalciferol 25 MCG (1000 UNIT) tablet Commonly known as: VITAMIN D3 Take 1,000 Units by mouth daily.   divalproex 250 MG DR tablet Commonly known as: DEPAKOTE Take 250 mg by mouth in the morning and at bedtime.   docusate sodium 100 MG capsule Commonly known as: COLACE  Take 1 capsule (100 mg total) by mouth 2 (two) times daily.   DULoxetine 30 MG capsule Commonly known as: CYMBALTA Take 30 mg by mouth daily.   levothyroxine 25 MCG tablet Commonly known as: SYNTHROID Take 25 mcg by mouth daily before breakfast.   lisinopril 10 MG tablet Commonly known as: ZESTRIL Take 10 mg by mouth daily.   memantine 10 MG tablet Commonly known as: NAMENDA Take 10 mg by mouth 2 (two) times daily.   QUEtiapine 25 MG tablet Commonly known as: SEROQUEL Take 25 mg by mouth at bedtime.   traZODone 50 MG tablet  Commonly known as: DESYREL Take 50 mg by mouth at bedtime.   vitamin B-12 1000 MCG tablet Commonly known as: CYANOCOBALAMIN Take 1,000 mcg by mouth. Mondays and Wednesdays   vitamin C 500 MG tablet Commonly known as: ASCORBIC ACID Take 500 mg by mouth daily.       Review of Systems  Constitutional: Negative for activity change, fever and unexpected weight change.  HENT: Positive for hearing loss. Negative for congestion and voice change.   Eyes: Negative for visual disturbance.  Respiratory: Negative for cough and shortness of breath.   Cardiovascular: Negative for leg swelling.  Gastrointestinal: Negative for abdominal pain and constipation.  Genitourinary: Negative for dysuria and urgency.  Musculoskeletal: Positive for arthralgias and gait problem.  Skin: Negative for color change.  Neurological: Negative for dizziness, speech difficulty and weakness.       Dementia  Psychiatric/Behavioral: Positive for confusion. Negative for agitation and sleep disturbance.    Immunization History  Administered Date(s) Administered  . Influenza-Unspecified 05/29/2017  . Moderna SARS-COVID-2 Vaccination 09/07/2019, 10/05/2019  . Pneumococcal Conjugate-13 06/21/2017  . Pneumococcal Polysaccharide-23 07/16/2018  . Tdap 06/21/2017   Pertinent  Health Maintenance Due  Topic Date Due  . DEXA SCAN  Completed  . PNA vac Low Risk Adult  Completed  . INFLUENZA VACCINE  Discontinued   Fall Risk  04/27/2018 04/25/2017  Falls in the past year? No No   Functional Status Survey:    Vitals:   12/11/19 1326  BP: 124/68  Pulse: 76  Resp: 18  Temp: (!) 97.5 F (36.4 C)  TempSrc: Oral  SpO2: 96%  Weight: 154 lb 3.2 oz (69.9 kg)  Height: 4\' 11"  (1.499 m)   Body mass index is 31.14 kg/m. Physical Exam Vitals and nursing note reviewed.  Constitutional:      Appearance: Normal appearance.  HENT:     Head: Normocephalic and atraumatic.     Mouth/Throat:     Mouth: Mucous membranes are  moist.  Eyes:     Extraocular Movements: Extraocular movements intact.     Conjunctiva/sclera: Conjunctivae normal.     Pupils: Pupils are equal, round, and reactive to light.  Cardiovascular:     Rate and Rhythm: Normal rate and regular rhythm.     Heart sounds: No murmur.  Pulmonary:     Effort: Pulmonary effort is normal.     Breath sounds: No rales.  Abdominal:     General: Bowel sounds are normal.     Palpations: Abdomen is soft.     Tenderness: There is no abdominal tenderness.  Musculoskeletal:     Cervical back: Normal range of motion and neck supple.     Right lower leg: No edema.     Left lower leg: No edema.  Skin:    General: Skin is warm and dry.  Neurological:     General: No focal deficit present.  Mental Status: She is alert. Mental status is at baseline.     Gait: Gait abnormal.     Comments: Oriented to self.   Psychiatric:     Comments: Pleasantly confused.      Labs reviewed: Recent Labs    04/04/19 0051 04/04/19 0051 04/05/19 0213 04/05/19 0213 04/06/19 0226 04/11/19 0000 04/25/19 0000 05/25/19 0000 07/30/19 0000 11/07/19 0000  NA 139   < > 138   < > 137   < > 139  --  143 141  K 3.7   < > 4.5   < > 4.0   < > 4.3  --  4.5 4.5  CL 105   < > 107   < > 105  --   --  106 107 106  CO2 22   < > 23   < > 26  --   --  27 27* 30*  GLUCOSE 132*  --  148*  --  119*  --   --   --   --   --   BUN 13   < > 13   < > 18   < > 13  --  17 18  CREATININE 0.83   < > 0.83   < > 0.78   < > 0.8  --  0.9 1.0  CALCIUM 10.0   < > 9.1   < > 9.2  --   --  10.0  --  10.0   < > = values in this interval not displayed.   Recent Labs    07/30/19 0000 11/07/19 0000  AST 11* 10*  ALT 8 7  ALKPHOS 117 92  ALBUMIN 3.8  --    Recent Labs    04/05/19 0213 04/05/19 0213 04/06/19 0226 04/06/19 0226 04/07/19 0324 04/11/19 0000 04/25/19 0000 04/25/19 0000 07/30/19 0000 09/03/19 0000 11/07/19 0000  WBC 11.4*   < > 9.0   < > 8.0   < > 6.3   < > 6.1 7.2 5.3   NEUTROABS  --   --   --   --   --   --  4,145  --  3,630  --  2,417  HGB 10.9*   < > 9.4*   < > 8.9*   < > 10.3*   < > 12.7 12.4 11.8*  HCT 34.7*   < > 30.3*   < > 29.0*   < > 32*   < > 39 37 36  MCV 95.1  --  94.7  --  96.0  --   --   --   --   --   --   PLT 199   < > 170   < > 177   < > 369   < > 232 226 221   < > = values in this interval not displayed.   Lab Results  Component Value Date   TSH 2.43 04/11/2019   Lab Results  Component Value Date   HGBA1C 5.3 11/01/2016   No results found for: CHOL, HDL, LDLCALC, LDLDIRECT, TRIG, CHOLHDL  Significant Diagnostic Results in last 30 days:  No results found.  Assessment/Plan  HTN (hypertension) Blood pressure is controlled, continue Lisinopril.   Mixed Alzheimer's and vascular dementia ([place]) Stable, continue memory care unit for safety, care needs, continue Memantine for memory.   Slow transit constipation Stable, continue Colace.   Hypothyroidism Stable, TSH 2.43 04/11/19, continue Levothyroxine.   Osteoarthritis Generalized, no pain or aches today,  continue Tylenol bid.   Depression, recurrent ([place]) Her mood is stable, continue Depakote 250mg  bid, Duloxetine 30mg  qd, Quetiapine 25mg  qd, Trazodone 50mg  qd, prn Lorazepam.    Family/ staff Communication: plan of care reviewed with the patient and charge nurse.   Labs/tests ordered:  none  Time spend 25 minutes.

## 2019-12-11 NOTE — Assessment & Plan Note (Signed)
Generalized, no pain or aches today, continue Tylenol bid.

## 2019-12-11 NOTE — Assessment & Plan Note (Signed)
Stable, TSH 2.43 04/11/19, continue Levothyroxine 

## 2019-12-11 NOTE — Assessment & Plan Note (Signed)
Stable, continue Colace.  

## 2019-12-12 ENCOUNTER — Encounter: Payer: Self-pay | Admitting: Nurse Practitioner

## 2019-12-16 ENCOUNTER — Non-Acute Institutional Stay (SKILLED_NURSING_FACILITY): Payer: Medicare Other | Admitting: Nurse Practitioner

## 2019-12-16 DIAGNOSIS — F0150 Vascular dementia without behavioral disturbance: Secondary | ICD-10-CM | POA: Diagnosis not present

## 2019-12-16 DIAGNOSIS — F339 Major depressive disorder, recurrent, unspecified: Secondary | ICD-10-CM | POA: Diagnosis not present

## 2019-12-16 DIAGNOSIS — R269 Unspecified abnormalities of gait and mobility: Secondary | ICD-10-CM | POA: Diagnosis not present

## 2019-12-16 DIAGNOSIS — I10 Essential (primary) hypertension: Secondary | ICD-10-CM

## 2019-12-16 DIAGNOSIS — F0280 Dementia in other diseases classified elsewhere without behavioral disturbance: Secondary | ICD-10-CM

## 2019-12-16 DIAGNOSIS — W19XXXA Unspecified fall, initial encounter: Secondary | ICD-10-CM | POA: Diagnosis not present

## 2019-12-16 DIAGNOSIS — G309 Alzheimer's disease, unspecified: Secondary | ICD-10-CM

## 2019-12-16 NOTE — Assessment & Plan Note (Signed)
Her mood is stable, continue Duloxetine, Quetiapine, Depakote, Trazodone.

## 2019-12-16 NOTE — Assessment & Plan Note (Signed)
The patient ambulates with walker, needs supervision for safety

## 2019-12-16 NOTE — Assessment & Plan Note (Signed)
Poor safety awareness, needs close supervision for safety, continue Memantine for memory.

## 2019-12-16 NOTE — Assessment & Plan Note (Signed)
Blood pressure is controlled, continue Lisinopril.  

## 2019-12-16 NOTE — Progress Notes (Signed)
Location:   [place] Room Number: 106 Place of Service:  SNF (31) Provider: Lennie Odor Shantana Christon NP  [name], MD  Patient Care Team: [name], MD as PCP - General (Internal Medicine) Tynika Luddy X, NP as Nurse Practitioner (Internal Medicine) [name], MD (Internal Medicine)  Extended Emergency Contact Information Primary Emergency Contact: [facility] Address: [address]          [place], [place] [address] [name] of [place] Phone: [phone] Relation: Son  Code Status: DNR Goals of care: Advanced Directive information Advanced Directives 12/17/2019  Does Patient Have a Medical Advance Directive? Yes  Type of Paramedic of [place];Out of facility DNR (pink MOST or yellow form)  Does patient want to make changes to medical advance directive? No - Patient declined  Copy of [place] in Chart? Yes - validated most recent copy scanned in chart (See row information)  Pre-existing out of facility DNR order (yellow form or pink MOST form) Yellow form placed in chart (order not valid for inpatient use)     Chief Complaint  Patient presents with  . Acute Visit    HPI:  Pt is a 86 y.o. female seen today for an acute visit for reported fall 12/14/19 when the patient was found lying on floor in her room, stated I am okay, I just lost balance, no apparent injury. The patient resides in memory care unit [facility] for safety, care assistance, ambulates with walker, taking Memantine for memory. Her mood is stable, on Depakote 250mg  bid, Duloxetine 30mg  qd, Quetiapine 25mg  qd, Trazodone 50mg  qd. HTN, blood pressure is controlled on Lisinopril 10mg  qd.    Past Medical History:  Diagnosis Date  . Alzheimer disease ([place]) 10/27/2016  . Arthritis   . Confusion 04/04/2019  . Depression, recurrent ([place]) 10/27/2016  . HTN (hypertension) 10/27/2016  . Hypertension   . Hypertensive kidney disease with CKD  (chronic kidney disease) stage V ([place]) 11/02/2016   11/02/16 Na 136, K 4.7, Bun 30, creat 1.52, TSH 0.86, wbc 6.7, Hgb 11.3, plt 264  . Hypothyroidism 10/27/2016  . Osteoarthritis 10/27/2016  . Thyroid disease    Past Surgical History:  Procedure Laterality Date  . ABDOMINAL HYSTERECTOMY  1986  . INTRAMEDULLARY (IM) NAIL INTERTROCHANTERIC Right 04/04/2019   Procedure: INTRAMEDULLARY (IM) NAIL INTERTROCHANTRIC;  Surgeon: Rod Can, MD;  Location: [facility];  Service: Orthopedics;  Laterality: Right;  . KNEE ARTHROSCOPY  2015/2017   Dr. Gustavus Bryant  . SPINE SURGERY  2009, 2011   Dr.Mark Nicoletta Dress    No Known Allergies  Allergies as of 12/16/2019   No Known Allergies     Medication List       Accurate as of Dec 16, 2019 11:59 PM. If you have any questions, ask your nurse or doctor.        acetaminophen 325 MG tablet Commonly known as: TYLENOL Take 650 mg by mouth 2 (two) times daily.   CALCIUM 600 PO Take 1 tablet by mouth daily.   cholecalciferol 25 MCG (1000 UNIT) tablet Commonly known as: VITAMIN D3 Take 1,000 Units by mouth daily.   divalproex 250 MG DR tablet Commonly known as: DEPAKOTE Take 250 mg by mouth in the morning and at bedtime.   docusate sodium 100 MG capsule Commonly known as: COLACE Take 1 capsule (100 mg total) by mouth 2 (two) times daily.   DULoxetine 30 MG capsule Commonly known as: CYMBALTA Take 30 mg by  mouth daily.   levothyroxine 25 MCG tablet Commonly known as: SYNTHROID Take 25 mcg by mouth daily before breakfast.   lisinopril 10 MG tablet Commonly known as: ZESTRIL Take 10 mg by mouth daily.   memantine 10 MG tablet Commonly known as: NAMENDA Take 10 mg by mouth 2 (two) times daily.   QUEtiapine 25 MG tablet Commonly known as: SEROQUEL Take 25 mg by mouth at bedtime.   traZODone 50 MG tablet Commonly known as: DESYREL Take 50 mg by mouth at bedtime.   vitamin B-12 1000 MCG tablet Commonly known as: CYANOCOBALAMIN Take 1,000 mcg  by mouth. Mondays and Wednesdays   vitamin C 500 MG tablet Commonly known as: ASCORBIC ACID Take 500 mg by mouth daily.       Review of Systems  Constitutional: Negative for activity change, fatigue and fever.  HENT: Positive for hearing loss. Negative for congestion and voice change.   Eyes: Negative for visual disturbance.  Respiratory: Negative for cough and shortness of breath.   Cardiovascular: Negative for leg swelling.  Gastrointestinal: Negative for abdominal pain and constipation.  Genitourinary: Negative for dysuria and urgency.  Musculoskeletal: Positive for arthralgias and gait problem.  Skin: Negative for color change.  Neurological: Negative for dizziness, speech difficulty and weakness.       Dementia  Psychiatric/Behavioral: Positive for confusion. Negative for agitation and sleep disturbance. The patient is not nervous/anxious.     Immunization History  Administered Date(s) Administered  . Influenza-Unspecified 05/29/2017  . Moderna SARS-COVID-2 Vaccination 09/07/2019, 10/05/2019  . Pneumococcal Conjugate-13 06/21/2017  . Pneumococcal Polysaccharide-23 07/16/2018  . Tdap 06/21/2017   Pertinent  Health Maintenance Due  Topic Date Due  . DEXA SCAN  Completed  . PNA vac Low Risk Adult  Completed  . INFLUENZA VACCINE  Discontinued   Fall Risk  04/27/2018 04/25/2017  Falls in the past year? No No   Functional Status Survey:    Vitals:   12/17/19 1050  BP: 138/78  Pulse: 72  Resp: 18  Temp: 98.1 F (36.7 C)  SpO2: 94%  Weight: 154 lb 3.2 oz (69.9 kg)  Height: 4\' 11"  (1.499 m)   Body mass index is 31.14 kg/m. Physical Exam Vitals and nursing note reviewed.  Constitutional:      Appearance: Normal appearance.  HENT:     Head: Normocephalic and atraumatic.     Mouth/Throat:     Mouth: Mucous membranes are moist.  Eyes:     Extraocular Movements: Extraocular movements intact.     Conjunctiva/sclera: Conjunctivae normal.     Pupils: Pupils are  equal, round, and reactive to light.  Cardiovascular:     Rate and Rhythm: Normal rate and regular rhythm.     Heart sounds: No murmur.  Pulmonary:     Effort: Pulmonary effort is normal.     Breath sounds: No rales.  Abdominal:     General: Bowel sounds are normal.     Palpations: Abdomen is soft.     Tenderness: There is no abdominal tenderness.  Musculoskeletal:     Cervical back: Normal range of motion and neck supple.     Right lower leg: No edema.     Left lower leg: No edema.  Skin:    General: Skin is warm and dry.  Neurological:     General: No focal deficit present.     Mental Status: She is alert. Mental status is at baseline.     Gait: Gait abnormal.     Comments:  Oriented to self.   Psychiatric:     Comments: Pleasantly confused.      Labs reviewed: Recent Labs    04/04/19 0051 04/04/19 0051 04/05/19 0213 04/05/19 0213 04/06/19 0226 04/11/19 0000 04/25/19 0000 05/25/19 0000 07/30/19 0000 11/07/19 0000  NA 139   < > 138   < > 137   < > 139  --  143 141  K 3.7   < > 4.5   < > 4.0   < > 4.3  --  4.5 4.5  CL 105   < > 107   < > 105  --   --  106 107 106  CO2 22   < > 23   < > 26  --   --  27 27* 30*  GLUCOSE 132*  --  148*  --  119*  --   --   --   --   --   BUN 13   < > 13   < > 18   < > 13  --  17 18  CREATININE 0.83   < > 0.83   < > 0.78   < > 0.8  --  0.9 1.0  CALCIUM 10.0   < > 9.1   < > 9.2  --   --  10.0  --  10.0   < > = values in this interval not displayed.   Recent Labs    07/30/19 0000 11/07/19 0000  AST 11* 10*  ALT 8 7  ALKPHOS 117 92  ALBUMIN 3.8  --    Recent Labs    04/05/19 0213 04/05/19 0213 04/06/19 0226 04/06/19 0226 04/07/19 0324 04/11/19 0000 04/25/19 0000 04/25/19 0000 07/30/19 0000 09/03/19 0000 11/07/19 0000  WBC 11.4*   < > 9.0   < > 8.0   < > 6.3   < > 6.1 7.2 5.3  NEUTROABS  --   --   --   --   --   --  4,145  --  3,630  --  2,417  HGB 10.9*   < > 9.4*   < > 8.9*   < > 10.3*   < > 12.7 12.4 11.8*  HCT  34.7*   < > 30.3*   < > 29.0*   < > 32*   < > 39 37 36  MCV 95.1  --  94.7  --  96.0  --   --   --   --   --   --   PLT 199   < > 170   < > 177   < > 369   < > 232 226 221   < > = values in this interval not displayed.   Lab Results  Component Value Date   TSH 2.43 04/11/2019   Lab Results  Component Value Date   HGBA1C 5.3 11/01/2016   No results found for: CHOL, HDL, LDLCALC, LDLDIRECT, TRIG, CHOLHDL  Significant Diagnostic Results in last 30 days:  No results found.  Assessment/Plan: Fall Reported fall 12/14/19 when the patient was found lying on floor in her room, stated I am okay, I just lost balance, no apparent injury. Close supervision for safety.   Gait abnormality The patient ambulates with walker, needs supervision for safety  Depression, recurrent ([place]) Her mood is stable, continue Duloxetine, Quetiapine, Depakote, Trazodone.   Mixed Alzheimer's and vascular dementia ([place]) Poor safety awareness, needs close supervision for safety, continue Memantine for memory.  HTN (hypertension) Blood pressure is controlled, continue Lisinopril.     Family/ staff Communication: plan of care reviewed with the patient and charge nurse.   Labs/tests ordered:  None  Time spend 25 minutes.

## 2019-12-16 NOTE — Assessment & Plan Note (Addendum)
Reported fall 12/14/19 when the patient was found lying on floor in her room, stated I am okay, I just lost balance, no apparent injury. Close supervision for safety.

## 2019-12-17 ENCOUNTER — Encounter: Payer: Self-pay | Admitting: Nurse Practitioner

## 2019-12-26 ENCOUNTER — Non-Acute Institutional Stay (SKILLED_NURSING_FACILITY): Payer: Medicare Other | Admitting: Internal Medicine

## 2019-12-26 ENCOUNTER — Other Ambulatory Visit: Payer: Self-pay | Admitting: Internal Medicine

## 2019-12-26 ENCOUNTER — Encounter: Payer: Self-pay | Admitting: Internal Medicine

## 2019-12-26 DIAGNOSIS — F339 Major depressive disorder, recurrent, unspecified: Secondary | ICD-10-CM | POA: Diagnosis not present

## 2019-12-26 DIAGNOSIS — F0281 Dementia in other diseases classified elsewhere with behavioral disturbance: Secondary | ICD-10-CM | POA: Diagnosis not present

## 2019-12-26 DIAGNOSIS — E538 Deficiency of other specified B group vitamins: Secondary | ICD-10-CM | POA: Diagnosis not present

## 2019-12-26 DIAGNOSIS — E039 Hypothyroidism, unspecified: Secondary | ICD-10-CM | POA: Diagnosis not present

## 2019-12-26 DIAGNOSIS — F02818 Dementia in other diseases classified elsewhere, unspecified severity, with other behavioral disturbance: Secondary | ICD-10-CM

## 2019-12-26 DIAGNOSIS — I10 Essential (primary) hypertension: Secondary | ICD-10-CM | POA: Diagnosis not present

## 2019-12-26 DIAGNOSIS — G308 Other Alzheimer's disease: Secondary | ICD-10-CM

## 2019-12-26 MED ORDER — LORAZEPAM 1 MG PO TABS: 1.0000 mg | ORAL_TABLET | Freq: Every day | ORAL | 0 refills | Status: DC

## 2019-12-26 NOTE — Progress Notes (Signed)
Location:   [facility] Room Number: 106 Place of Service:  SNF [phone]) Provider:  Veleta Miners MD   [name], MD  Patient Care Team: [name], MD as PCP - General (Internal Medicine) Mast, Man X, NP as Nurse Practitioner (Internal Medicine) [name], MD (Internal Medicine)  Extended Emergency Contact Information Primary Emergency Contact: [facility] Address: [address]          [place], [place] [address] [name] of [place] Phone: [phone] Relation: Son  Code Status:  DNR Goals of care: Advanced Directive information Advanced Directives 12/17/2019  Does Patient Have a Medical Advance Directive? Yes  Type of Paramedic of [place];Out of facility DNR (pink MOST or yellow form)  Does patient want to make changes to medical advance directive? No - Patient declined  Copy of [place] in Chart? Yes - validated most recent copy scanned in chart (See row information)  Pre-existing out of facility DNR order (yellow form or pink MOST form) Yellow form placed in chart (order not valid for inpatient use)     Chief Complaint  Patient presents with  . Acute Visit    Behavior issues    HPI:  Pt is a 86 y.o. female seen today for an acute visit for Behavior issues and insomnia   Patient has h/o Hypertension, Hypothyroidism, Alzheimer's Dementia with behavior issues, Depression, B12 def, S/P Right Hip fracture with Intramedullary Fixation in 8/27 Patient lives in a memory unit.  Patient has h/o behavior issues. Had started her on Ativan and per nurses she did well. But now she is having issues again . By nurses last night patient continued to try to get out of the facility. She also continues to pace at night. This morning patient is back to her baseline did not have any pain complaints was sitting at the nursing station. Patient did have some marks in her right arm where she has been  picking on herself. Her weight is staying stable her appetite is good. She walks with a walker no recent falls.  Past Medical History:  Diagnosis Date  . Alzheimer disease ([place]) 10/27/2016  . Arthritis   . Confusion 04/04/2019  . Depression, recurrent ([place]) 10/27/2016  . HTN (hypertension) 10/27/2016  . Hypertension   . Hypertensive kidney disease with CKD (chronic kidney disease) stage V ([place]) 11/02/2016   11/02/16 Na 136, K 4.7, Bun 30, creat 1.52, TSH 0.86, wbc 6.7, Hgb 11.3, plt 264  . Hypothyroidism 10/27/2016  . Osteoarthritis 10/27/2016  . Thyroid disease    Past Surgical History:  Procedure Laterality Date  . ABDOMINAL HYSTERECTOMY  1986  . INTRAMEDULLARY (IM) NAIL INTERTROCHANTERIC Right 04/04/2019   Procedure: INTRAMEDULLARY (IM) NAIL INTERTROCHANTRIC;  Surgeon: Rod Can, MD;  Location: [facility];  Service: Orthopedics;  Laterality: Right;  . KNEE ARTHROSCOPY  2015/2017   Dr. Gustavus Bryant  . SPINE SURGERY  2009, 2011   Dr.Mark Nicoletta Dress    No Known Allergies  Allergies as of 12/26/2019   No Known Allergies     Medication List       Accurate as of Dec 26, 2019  4:23 PM. If you have any questions, ask your nurse or doctor.        acetaminophen 325 MG tablet Commonly known as: TYLENOL Take 650 mg by mouth 2 (two) times daily.   CALCIUM 600 PO Take 1 tablet by mouth daily.   cholecalciferol 25 MCG (1000 UNIT) tablet  Commonly known as: VITAMIN D3 Take 1,000 Units by mouth daily.   divalproex 250 MG DR tablet Commonly known as: DEPAKOTE Take 250 mg by mouth in the morning and at bedtime.   docusate sodium 100 MG capsule Commonly known as: COLACE Take 1 capsule (100 mg total) by mouth 2 (two) times daily.   DULoxetine 30 MG capsule Commonly known as: CYMBALTA Take 30 mg by mouth daily.   levothyroxine 25 MCG tablet Commonly known as: SYNTHROID Take 25 mcg by mouth daily before breakfast.   lisinopril 10 MG tablet Commonly known as: ZESTRIL Take 10 mg by  mouth daily.   memantine 10 MG tablet Commonly known as: NAMENDA Take 10 mg by mouth 2 (two) times daily.   QUEtiapine 25 MG tablet Commonly known as: SEROQUEL Take 25 mg by mouth at bedtime.   traZODone 50 MG tablet Commonly known as: DESYREL Take 50 mg by mouth at bedtime.   vitamin B-12 1000 MCG tablet Commonly known as: CYANOCOBALAMIN Take 1,000 mcg by mouth. Mondays and Wednesdays   vitamin C 500 MG tablet Commonly known as: ASCORBIC ACID Take 500 mg by mouth daily.       Review of Systems  Constitutional: Negative.   HENT: Negative.   Respiratory: Negative.   Cardiovascular: Positive for leg swelling.  Gastrointestinal: Negative.   Genitourinary: Negative.   Musculoskeletal: Negative.   Neurological: Negative.   Psychiatric/Behavioral: Positive for agitation, behavioral problems, confusion, self-injury and sleep disturbance. The patient is nervous/anxious and is hyperactive.     Immunization History  Administered Date(s) Administered  . Influenza-Unspecified 05/29/2017  . Moderna SARS-COVID-2 Vaccination 09/07/2019, 10/05/2019  . Pneumococcal Conjugate-13 06/21/2017  . Pneumococcal Polysaccharide-23 07/16/2018  . Tdap 06/21/2017   Pertinent  Health Maintenance Due  Topic Date Due  . DEXA SCAN  Completed  . PNA vac Low Risk Adult  Completed  . INFLUENZA VACCINE  Discontinued   Fall Risk  04/27/2018 04/25/2017  Falls in the past year? No No   Functional Status Survey:    Vitals:   12/26/19 1611  BP: 130/78  Pulse: 72  Resp: 18  Temp: 98.6 F (37 C)  SpO2: 97%  Weight: 154 lb 3.2 oz (69.9 kg)  Height: 4\' 11"  (1.499 m)   Body mass index is 31.14 kg/m. Physical Exam   Constitutional:Well-developed and well-nourished.  HENT:  Head: Normocephalic.  Mouth/Throat: Oropharynx is clear and moist.  Eyes: Pupils are equal, round, and reactive to light.  Neck: Neck supple.  Cardiovascular: Normal rate and normal heart sounds.  No murmur  heard. Pulmonary/Chest: Effort normal and breath sounds normal. No respiratory distress. No wheezes. She has no rales.  Abdominal: Soft. Bowel sounds are normal. No distension. There is no tenderness. There is no rebound.  Musculoskeletal: Mild edema Bilateral  Lymphadenopathy: none Neurological: Walks with the walker Skin: Skin is warm and dry. Has Places on her Arm where she was picking on herself Psychiatric: Pleasantly confused   Labs reviewed: Recent Labs    04/04/19 0051 04/04/19 0051 04/05/19 0213 04/05/19 0213 04/06/19 0226 04/11/19 0000 04/25/19 0000 05/25/19 0000 07/30/19 0000 11/07/19 0000  NA 139   < > 138   < > 137   < > 139  --  143 141  K 3.7   < > 4.5   < > 4.0   < > 4.3  --  4.5 4.5  CL 105   < > 107   < > 105  --   --  106 107 106  CO2 22   < > 23   < > 26  --   --  27 27* 30*  GLUCOSE 132*  --  148*  --  119*  --   --   --   --   --   BUN 13   < > 13   < > 18   < > 13  --  17 18  CREATININE 0.83   < > 0.83   < > 0.78   < > 0.8  --  0.9 1.0  CALCIUM 10.0   < > 9.1   < > 9.2  --   --  10.0  --  10.0   < > = values in this interval not displayed.   Recent Labs    07/30/19 0000 11/07/19 0000  AST 11* 10*  ALT 8 7  ALKPHOS 117 92  ALBUMIN 3.8  --    Recent Labs    04/05/19 0213 04/05/19 0213 04/06/19 0226 04/06/19 0226 04/07/19 0324 04/11/19 0000 04/25/19 0000 04/25/19 0000 07/30/19 0000 09/03/19 0000 11/07/19 0000  WBC 11.4*   < > 9.0   < > 8.0   < > 6.3   < > 6.1 7.2 5.3  NEUTROABS  --   --   --   --   --   --  4,145  --  3,630  --  2,417  HGB 10.9*   < > 9.4*   < > 8.9*   < > 10.3*   < > 12.7 12.4 11.8*  HCT 34.7*   < > 30.3*   < > 29.0*   < > 32*   < > 39 37 36  MCV 95.1  --  94.7  --  96.0  --   --   --   --   --   --   PLT 199   < > 170   < > 177   < > 369   < > 232 226 221   < > = values in this interval not displayed.   Lab Results  Component Value Date   TSH 2.43 04/11/2019   Lab Results  Component Value Date   HGBA1C 5.3  11/01/2016   No results found for: CHOL, HDL, LDLCALC, LDLDIRECT, TRIG, CHOLHDL  Significant Diagnostic Results in last 30 days:  No results found.  Assessment/Plan Alzheimer's disease of other onset with behavioral disturbance (HCC) On Depakote, Namenda, Seroquel We'll start her on Ativan 1 mg nightly and see if that helps Essential hypertension Doing well on lisinopril Depression, recurrent (HCC) Continue on Cymbalta Hypothyroidism, unspecified type TSH normal in 9/20 B12 deficiency Normal Level in 12/20  Trying to schedule her for DEXA scan  Family/ staff Communication:   Labs/tests ordered:

## 2019-12-30 ENCOUNTER — Encounter: Payer: Self-pay | Admitting: Nurse Practitioner

## 2019-12-30 ENCOUNTER — Non-Acute Institutional Stay (SKILLED_NURSING_FACILITY): Payer: Medicare Other | Admitting: Nurse Practitioner

## 2019-12-30 DIAGNOSIS — W19XXXA Unspecified fall, initial encounter: Secondary | ICD-10-CM

## 2019-12-30 DIAGNOSIS — G309 Alzheimer's disease, unspecified: Secondary | ICD-10-CM | POA: Diagnosis not present

## 2019-12-30 DIAGNOSIS — I10 Essential (primary) hypertension: Secondary | ICD-10-CM

## 2019-12-30 DIAGNOSIS — F339 Major depressive disorder, recurrent, unspecified: Secondary | ICD-10-CM | POA: Diagnosis not present

## 2019-12-30 DIAGNOSIS — M159 Polyosteoarthritis, unspecified: Secondary | ICD-10-CM

## 2019-12-30 DIAGNOSIS — R269 Unspecified abnormalities of gait and mobility: Secondary | ICD-10-CM | POA: Diagnosis not present

## 2019-12-30 DIAGNOSIS — F0150 Vascular dementia without behavioral disturbance: Secondary | ICD-10-CM

## 2019-12-30 DIAGNOSIS — F0280 Dementia in other diseases classified elsewhere without behavioral disturbance: Secondary | ICD-10-CM | POA: Diagnosis not present

## 2019-12-30 DIAGNOSIS — S72141D Displaced intertrochanteric fracture of right femur, subsequent encounter for closed fracture with routine healing: Secondary | ICD-10-CM | POA: Diagnosis not present

## 2019-12-30 NOTE — Assessment & Plan Note (Signed)
Continue memory care unit [facility] for safety, care assistance with close supervision/assistance, continue Memantine.

## 2019-12-30 NOTE — Assessment & Plan Note (Signed)
Unsteady gait, lack of safety awareness are contributory, close supervision/assistance needed for safe transfer/walking with walker.

## 2019-12-30 NOTE — Assessment & Plan Note (Signed)
Continue ambulating with walker with close supervision/assistance.

## 2019-12-30 NOTE — Assessment & Plan Note (Addendum)
Mood is stable, continue Duloxetine, Trazodone, Quetiapine, Depakote, Lorazepam 1mg  qhs started 12/26/19

## 2019-12-30 NOTE — Assessment & Plan Note (Signed)
Multiple sites, no acute pain, continue Tylenol.

## 2019-12-30 NOTE — Progress Notes (Signed)
Location:   SNF [facility] Room Number: 106 Place of Service:  SNF (31) Provider: [facility] Paitlyn Mcclatchey NP  [name], MD  Patient Care Team: [name], MD as PCP - General (Internal Medicine) Amberlyn Martinezgarcia X, NP as Nurse Practitioner (Internal Medicine) [name], MD (Internal Medicine)  Extended Emergency Contact Information Primary Emergency Contact: [facility] Address: [address]          [place], [place] [address] [name] of [place] Phone: [phone] Relation: Son  Code Status: DNR Goals of care: Advanced Directive information Advanced Directives 12/17/2019  Does Patient Have a Medical Advance Directive? Yes  Type of Paramedic of [place];Out of facility DNR (pink MOST or yellow form)  Does patient want to make changes to medical advance directive? No - Patient declined  Copy of [place] in Chart? Yes - validated most recent copy scanned in chart (See row information)  Pre-existing out of facility DNR order (yellow form or pink MOST form) Yellow form placed in chart (order not valid for inpatient use)     Chief Complaint  Patient presents with  . Acute Visit    Fall    HPI:  Pt is a 86 y.o. female seen today for an acute visit for fall when getting off [place] bus, denied left knee pain reported by the staff upon my examination. The patient resides in memory care unit [facility] for safety, care assistance, ambulates with walker, on Tylenol 650mg  bid for OA pain, Memantine for memory. Her mood is stable, on Depakote 250mg  bid, Duloxetine 30mg  qd, Quetiapine 25mg  qd, Trazodone 50mg  qd, started Ativan 1mg  qhs 12/26/19. HTN, controlled, 150/76mmHg,  asymptomatic, on Lisinopril 10mg  qd.    Past Medical History:  Diagnosis Date  . Alzheimer disease ([place]) 10/27/2016  . Arthritis   . Confusion 04/04/2019  . Depression, recurrent ([place]) 10/27/2016  . HTN (hypertension) 10/27/2016  . Hypertension   . Hypertensive kidney  disease with CKD (chronic kidney disease) stage V ([place]) 11/02/2016   11/02/16 Na 136, K 4.7, Bun 30, creat 1.52, TSH 0.86, wbc 6.7, Hgb 11.3, plt 264  . Hypothyroidism 10/27/2016  . Osteoarthritis 10/27/2016  . Thyroid disease    Past Surgical History:  Procedure Laterality Date  . ABDOMINAL HYSTERECTOMY  1986  . INTRAMEDULLARY (IM) NAIL INTERTROCHANTERIC Right 04/04/2019   Procedure: INTRAMEDULLARY (IM) NAIL INTERTROCHANTRIC;  Surgeon: Rod Can, MD;  Location: [facility];  Service: Orthopedics;  Laterality: Right;  . KNEE ARTHROSCOPY  2015/2017   Dr. Gustavus Bryant  . SPINE SURGERY  2009, 2011   Dr.Mark Nicoletta Dress    No Known Allergies  Allergies as of 12/30/2019   No Known Allergies     Medication List       Accurate as of Dec 30, 2019  4:49 PM. If you have any questions, ask your nurse or doctor.        acetaminophen 325 MG tablet Commonly known as: TYLENOL Take 650 mg by mouth 2 (two) times daily.   CALCIUM 600 PO Take 1 tablet by mouth daily.   cholecalciferol 25 MCG (1000 UNIT) tablet Commonly known as: VITAMIN D3 Take 1,000 Units by mouth daily.   divalproex 250 MG DR tablet Commonly known as: DEPAKOTE Take 250 mg by mouth in the morning and at bedtime.   docusate sodium 100 MG capsule Commonly known as: COLACE Take 1 capsule (100 mg total) by mouth 2 (two) times daily.   DULoxetine 30 MG capsule Commonly  known as: CYMBALTA Take 30 mg by mouth daily.   levothyroxine 25 MCG tablet Commonly known as: SYNTHROID Take 25 mcg by mouth daily before breakfast.   lisinopril 10 MG tablet Commonly known as: ZESTRIL Take 10 mg by mouth daily.   LORazepam 1 MG tablet Commonly known as: ATIVAN Take 1 mg by mouth at bedtime.   memantine 10 MG tablet Commonly known as: NAMENDA Take 10 mg by mouth 2 (two) times daily.   QUEtiapine 25 MG tablet Commonly known as: SEROQUEL Take 25 mg by mouth at bedtime.   traZODone 50 MG tablet Commonly known as: DESYREL Take 50 mg by  mouth at bedtime.   vitamin B-12 1000 MCG tablet Commonly known as: CYANOCOBALAMIN Take 1,000 mcg by mouth. Mondays and Wednesdays   vitamin C 500 MG tablet Commonly known as: ASCORBIC ACID Take 500 mg by mouth daily.       Review of Systems  Constitutional: Negative for activity change and fever.  HENT: Positive for hearing loss. Negative for congestion and voice change.   Eyes: Negative for visual disturbance.  Respiratory: Negative for cough.   Cardiovascular: Negative for leg swelling.  Gastrointestinal: Negative for abdominal pain.  Genitourinary: Negative for dysuria and urgency.  Musculoskeletal: Positive for arthralgias and gait problem.  Skin: Negative for color change.  Neurological: Negative for dizziness, speech difficulty and weakness.       Dementia  Psychiatric/Behavioral: Positive for confusion. Negative for agitation and sleep disturbance. The patient is not nervous/anxious.     Immunization History  Administered Date(s) Administered  . Influenza-Unspecified 05/29/2017  . Moderna SARS-COVID-2 Vaccination 09/07/2019, 10/05/2019  . Pneumococcal Conjugate-13 06/21/2017  . Pneumococcal Polysaccharide-23 07/16/2018  . Tdap 06/21/2017   Pertinent  Health Maintenance Due  Topic Date Due  . DEXA SCAN  Completed  . PNA vac Low Risk Adult  Completed  . INFLUENZA VACCINE  Discontinued   Fall Risk  04/27/2018 04/25/2017  Falls in the past year? No No   Functional Status Survey:    Vitals:   12/30/19 1453  BP: 140/80  Pulse: 84  Resp: 20  Temp: 98 F (36.7 C)  SpO2: 95%  Weight: 154 lb 3.2 oz (69.9 kg)  Height: 4\' 11"  (1.499 m)   Body mass index is 31.14 kg/m. Physical Exam Vitals and nursing note reviewed.  Constitutional:      Appearance: Normal appearance.  HENT:     Head: Normocephalic and atraumatic.  Eyes:     Extraocular Movements: Extraocular movements intact.     Conjunctiva/sclera: Conjunctivae normal.     Pupils: Pupils are equal,  round, and reactive to light.  Cardiovascular:     Rate and Rhythm: Normal rate and regular rhythm.     Heart sounds: No murmur.  Pulmonary:     Effort: Pulmonary effort is normal.     Breath sounds: No rales.  Abdominal:     General: Bowel sounds are normal.     Palpations: Abdomen is soft.     Tenderness: There is no abdominal tenderness.  Musculoskeletal:     Cervical back: Normal range of motion and neck supple.     Right lower leg: No edema.     Left lower leg: No edema.  Skin:    General: Skin is warm and dry.  Neurological:     General: No focal deficit present.     Mental Status: She is alert. Mental status is at baseline.     Gait: Gait abnormal.  Comments: Oriented to self.   Psychiatric:     Comments: Pleasantly confused.      Labs reviewed: Recent Labs    04/04/19 0051 04/04/19 0051 04/05/19 0213 04/05/19 0213 04/06/19 0226 04/11/19 0000 04/25/19 0000 05/25/19 0000 07/30/19 0000 11/07/19 0000  NA 139   < > 138   < > 137   < > 139  --  143 141  K 3.7   < > 4.5   < > 4.0   < > 4.3  --  4.5 4.5  CL 105   < > 107   < > 105  --   --  106 107 106  CO2 22   < > 23   < > 26  --   --  27 27* 30*  GLUCOSE 132*  --  148*  --  119*  --   --   --   --   --   BUN 13   < > 13   < > 18   < > 13  --  17 18  CREATININE 0.83   < > 0.83   < > 0.78   < > 0.8  --  0.9 1.0  CALCIUM 10.0   < > 9.1   < > 9.2  --   --  10.0  --  10.0   < > = values in this interval not displayed.   Recent Labs    07/30/19 0000 11/07/19 0000  AST 11* 10*  ALT 8 7  ALKPHOS 117 92  ALBUMIN 3.8  --    Recent Labs    04/05/19 0213 04/05/19 0213 04/06/19 0226 04/06/19 0226 04/07/19 0324 04/11/19 0000 04/25/19 0000 04/25/19 0000 07/30/19 0000 09/03/19 0000 11/07/19 0000  WBC 11.4*   < > 9.0   < > 8.0   < > 6.3   < > 6.1 7.2 5.3  NEUTROABS  --   --   --   --   --   --  4,145  --  3,630  --  2,417  HGB 10.9*   < > 9.4*   < > 8.9*   < > 10.3*   < > 12.7 12.4 11.8*  HCT 34.7*   <  > 30.3*   < > 29.0*   < > 32*   < > 39 37 36  MCV 95.1  --  94.7  --  96.0  --   --   --   --   --   --   PLT 199   < > 170   < > 177   < > 369   < > 232 226 221   < > = values in this interval not displayed.   Lab Results  Component Value Date   TSH 2.43 04/11/2019   Lab Results  Component Value Date   HGBA1C 5.3 11/01/2016   No results found for: CHOL, HDL, LDLCALC, LDLDIRECT, TRIG, CHOLHDL  Significant Diagnostic Results in last 30 days:  No results found.  Assessment/Plan: Fall Unsteady gait, lack of safety awareness are contributory, close supervision/assistance needed for safe transfer/walking with walker.   Gait abnormality Continue ambulating with walker with close supervision/assistance.   Depression, recurrent ([place]) Mood is stable, continue Duloxetine, Trazodone, Quetiapine, Depakote, Lorazepam 1mg  qhs started 12/26/19  Osteoarthritis Multiple sites, no acute pain, continue Tylenol.   Mixed Alzheimer's and vascular dementia ([place]) Continue memory care unit [facility] for safety, care assistance with close supervision/assistance, continue Memantine.  HTN (hypertension) Blood pressure is loose controlled, continue Lisinopril.     Family/ staff Communication: plan of care reviewed with the patient and charge nurse.   Labs/tests ordered:  None  Time spend 25 minutes.

## 2019-12-30 NOTE — Assessment & Plan Note (Signed)
Blood pressure is loose controlled, continue Lisinopril.

## 2019-12-31 ENCOUNTER — Encounter (HOSPITAL_COMMUNITY): Payer: Self-pay | Admitting: Emergency Medicine

## 2019-12-31 ENCOUNTER — Emergency Department (HOSPITAL_COMMUNITY): Payer: Medicare Other

## 2019-12-31 ENCOUNTER — Encounter: Payer: Self-pay | Admitting: Internal Medicine

## 2019-12-31 ENCOUNTER — Non-Acute Institutional Stay (SKILLED_NURSING_FACILITY): Payer: Medicare Other | Admitting: Internal Medicine

## 2019-12-31 ENCOUNTER — Inpatient Hospital Stay (HOSPITAL_COMMUNITY)
Admission: EM | Admit: 2019-12-31 | Discharge: 2020-01-03 | DRG: 493 | Disposition: A | Discharge status: Skilled Nursing Facility | Payer: Medicare Other | Source: Skilled Nursing Facility | Attending: Internal Medicine | Admitting: Internal Medicine

## 2019-12-31 ENCOUNTER — Other Ambulatory Visit: Payer: Self-pay

## 2019-12-31 DIAGNOSIS — E039 Hypothyroidism, unspecified: Secondary | ICD-10-CM

## 2019-12-31 DIAGNOSIS — S82441A Displaced spiral fracture of shaft of right fibula, initial encounter for closed fracture: Secondary | ICD-10-CM | POA: Diagnosis present

## 2019-12-31 DIAGNOSIS — Y92129 Unspecified place in nursing home as the place of occurrence of the external cause: Secondary | ICD-10-CM

## 2019-12-31 DIAGNOSIS — R52 Pain, unspecified: Secondary | ICD-10-CM | POA: Diagnosis not present

## 2019-12-31 DIAGNOSIS — Z03818 Encounter for observation for suspected exposure to other biological agents ruled out: Secondary | ICD-10-CM | POA: Diagnosis not present

## 2019-12-31 DIAGNOSIS — S82242A Displaced spiral fracture of shaft of left tibia, initial encounter for closed fracture: Secondary | ICD-10-CM

## 2019-12-31 DIAGNOSIS — D62 Acute posthemorrhagic anemia: Secondary | ICD-10-CM | POA: Diagnosis not present

## 2019-12-31 DIAGNOSIS — N1831 Chronic kidney disease, stage 3a: Secondary | ICD-10-CM

## 2019-12-31 DIAGNOSIS — F0150 Vascular dementia without behavioral disturbance: Secondary | ICD-10-CM

## 2019-12-31 DIAGNOSIS — W19XXXA Unspecified fall, initial encounter: Secondary | ICD-10-CM

## 2019-12-31 DIAGNOSIS — Z79890 Hormone replacement therapy (postmenopausal): Secondary | ICD-10-CM

## 2019-12-31 DIAGNOSIS — S82202A Unspecified fracture of shaft of left tibia, initial encounter for closed fracture: Secondary | ICD-10-CM | POA: Diagnosis present

## 2019-12-31 DIAGNOSIS — Z90710 Acquired absence of both cervix and uterus: Secondary | ICD-10-CM

## 2019-12-31 DIAGNOSIS — I129 Hypertensive chronic kidney disease with stage 1 through stage 4 chronic kidney disease, or unspecified chronic kidney disease: Secondary | ICD-10-CM | POA: Diagnosis present

## 2019-12-31 DIAGNOSIS — S82301A Unspecified fracture of lower end of right tibia, initial encounter for closed fracture: Secondary | ICD-10-CM | POA: Diagnosis not present

## 2019-12-31 DIAGNOSIS — S82241A Displaced spiral fracture of shaft of right tibia, initial encounter for closed fracture: Secondary | ICD-10-CM | POA: Diagnosis not present

## 2019-12-31 DIAGNOSIS — S82401A Unspecified fracture of shaft of right fibula, initial encounter for closed fracture: Secondary | ICD-10-CM | POA: Diagnosis present

## 2019-12-31 DIAGNOSIS — F0280 Dementia in other diseases classified elsewhere without behavioral disturbance: Secondary | ICD-10-CM

## 2019-12-31 DIAGNOSIS — I10 Essential (primary) hypertension: Secondary | ICD-10-CM

## 2019-12-31 DIAGNOSIS — M79674 Pain in right toe(s): Secondary | ICD-10-CM | POA: Diagnosis not present

## 2019-12-31 DIAGNOSIS — Z20822 Contact with and (suspected) exposure to covid-19: Secondary | ICD-10-CM | POA: Diagnosis present

## 2019-12-31 DIAGNOSIS — Z79899 Other long term (current) drug therapy: Secondary | ICD-10-CM

## 2019-12-31 DIAGNOSIS — T148XXA Other injury of unspecified body region, initial encounter: Secondary | ICD-10-CM

## 2019-12-31 DIAGNOSIS — Z96651 Presence of right artificial knee joint: Secondary | ICD-10-CM | POA: Diagnosis present

## 2019-12-31 DIAGNOSIS — Z66 Do not resuscitate: Secondary | ICD-10-CM | POA: Diagnosis present

## 2019-12-31 DIAGNOSIS — R296 Repeated falls: Secondary | ICD-10-CM | POA: Diagnosis present

## 2019-12-31 DIAGNOSIS — G309 Alzheimer's disease, unspecified: Secondary | ICD-10-CM | POA: Diagnosis not present

## 2019-12-31 DIAGNOSIS — W1830XA Fall on same level, unspecified, initial encounter: Secondary | ICD-10-CM | POA: Diagnosis present

## 2019-12-31 DIAGNOSIS — R0902 Hypoxemia: Secondary | ICD-10-CM | POA: Diagnosis not present

## 2019-12-31 DIAGNOSIS — Z419 Encounter for procedure for purposes other than remedying health state, unspecified: Secondary | ICD-10-CM

## 2019-12-31 DIAGNOSIS — R269 Unspecified abnormalities of gait and mobility: Secondary | ICD-10-CM

## 2019-12-31 DIAGNOSIS — F0391 Unspecified dementia with behavioral disturbance: Secondary | ICD-10-CM | POA: Diagnosis present

## 2019-12-31 DIAGNOSIS — S82431A Displaced oblique fracture of shaft of right fibula, initial encounter for closed fracture: Secondary | ICD-10-CM | POA: Diagnosis not present

## 2019-12-31 DIAGNOSIS — S82831A Other fracture of upper and lower end of right fibula, initial encounter for closed fracture: Secondary | ICD-10-CM | POA: Diagnosis not present

## 2019-12-31 DIAGNOSIS — S82201A Unspecified fracture of shaft of right tibia, initial encounter for closed fracture: Secondary | ICD-10-CM | POA: Diagnosis present

## 2019-12-31 DIAGNOSIS — F339 Major depressive disorder, recurrent, unspecified: Secondary | ICD-10-CM | POA: Diagnosis present

## 2019-12-31 DIAGNOSIS — S99921A Unspecified injury of right foot, initial encounter: Secondary | ICD-10-CM | POA: Diagnosis not present

## 2019-12-31 DIAGNOSIS — S82209A Unspecified fracture of shaft of unspecified tibia, initial encounter for closed fracture: Secondary | ICD-10-CM | POA: Diagnosis present

## 2019-12-31 LAB — TYPE AND SCREEN
ABO/RH(D): O NEG
Antibody Screen: NEGATIVE

## 2019-12-31 LAB — CBC
HCT: 40.1 % (ref 36.0–46.0)
Hemoglobin: 12.5 g/dL (ref 12.0–15.0)
MCH: 30 pg (ref 26.0–34.0)
MCHC: 31.2 g/dL (ref 30.0–36.0)
MCV: 96.2 fL (ref 80.0–100.0)
Platelets: 217 10*3/uL (ref 150–400)
RBC: 4.17 MIL/uL (ref 3.87–5.11)
RDW: 13.8 % (ref 11.5–15.5)
WBC: 10.1 10*3/uL (ref 4.0–10.5)
nRBC: 0 % (ref 0.0–0.2)

## 2019-12-31 LAB — BASIC METABOLIC PANEL
Anion gap: 12 (ref 5–15)
BUN: 15 mg/dL (ref 8–23)
CO2: 24 mmol/L (ref 22–32)
Calcium: 10.4 mg/dL — ABNORMAL HIGH (ref 8.9–10.3)
Chloride: 104 mmol/L (ref 98–111)
Creatinine, Ser: 0.94 mg/dL (ref 0.44–1.00)
GFR calc Af Amer: >60 mL/min (ref >60)
GFR calc non Af Amer: 55 mL/min — ABNORMAL LOW (ref >60)
Glucose, Bld: 112 mg/dL — ABNORMAL HIGH (ref 70–99)
Potassium: 4.5 mmol/L (ref 3.5–5.1)
Sodium: 140 mmol/L (ref 135–145)

## 2019-12-31 LAB — SARS CORONAVIRUS 2 BY RT PCR (HOSPITAL ORDER, PERFORMED IN ~~LOC~~ HOSPITAL LAB): SARS Coronavirus 2: NEGATIVE

## 2019-12-31 LAB — PROTIME-INR
INR: 1 (ref 0.8–1.2)
Prothrombin Time: 13.1 seconds (ref 11.4–15.2)

## 2019-12-31 LAB — ABO/RH: ABO/RH(D): O NEG

## 2019-12-31 MED ORDER — LORAZEPAM 1 MG PO TABS
1.0000 mg | ORAL_TABLET | Freq: Every day | ORAL | Status: DC
  Administered 2020-01-01 – 2020-01-02 (×3): 1 mg via ORAL
  Filled 2019-12-31 (×3): qty 1

## 2019-12-31 MED ORDER — DULOXETINE HCL 30 MG PO CPEP
30.0000 mg | ORAL_CAPSULE | Freq: Every day | ORAL | Status: DC
  Administered 2020-01-01 – 2020-01-03 (×3): 30 mg via ORAL
  Filled 2019-12-31 (×4): qty 1

## 2019-12-31 MED ORDER — TRAMADOL HCL 50 MG PO TABS
50.0000 mg | ORAL_TABLET | Freq: Four times a day (QID) | ORAL | Status: DC | PRN
  Administered 2020-01-01: 50 mg via ORAL
  Filled 2019-12-31: qty 1

## 2019-12-31 MED ORDER — MORPHINE SULFATE (PF) 2 MG/ML IV SOLN: 2.0000 mg | Freq: Four times a day (QID) | INTRAVENOUS | Status: DC | PRN

## 2019-12-31 MED ORDER — LACTATED RINGERS IV SOLN: INTRAVENOUS | Status: AC

## 2019-12-31 MED ORDER — ONDANSETRON HCL 4 MG/2ML IJ SOLN: 4.0000 mg | Freq: Four times a day (QID) | INTRAMUSCULAR | Status: DC | PRN

## 2019-12-31 MED ORDER — POLYETHYLENE GLYCOL 3350 17 G PO PACK: 17.0000 g | PACK | Freq: Every day | ORAL | Status: DC | PRN

## 2019-12-31 MED ORDER — DIVALPROEX SODIUM 250 MG PO DR TAB
250.0000 mg | DELAYED_RELEASE_TABLET | Freq: Two times a day (BID) | ORAL | Status: DC
  Administered 2020-01-01 – 2020-01-03 (×5): 250 mg via ORAL
  Filled 2019-12-31 (×7): qty 1

## 2019-12-31 MED ORDER — ACETAMINOPHEN 325 MG PO TABS: 650.0000 mg | ORAL_TABLET | Freq: Four times a day (QID) | ORAL | Status: DC | PRN

## 2019-12-31 MED ORDER — ENALAPRILAT 1.25 MG/ML IV SOLN
1.2500 mg | Freq: Four times a day (QID) | INTRAVENOUS | Status: DC | PRN
  Administered 2019-12-31: 1.25 mg via INTRAVENOUS
  Filled 2019-12-31 (×2): qty 1

## 2019-12-31 MED ORDER — ONDANSETRON HCL 4 MG PO TABS: 4.0000 mg | ORAL_TABLET | Freq: Four times a day (QID) | ORAL | Status: DC | PRN

## 2019-12-31 MED ORDER — TRAZODONE HCL 50 MG PO TABS
50.0000 mg | ORAL_TABLET | Freq: Every day | ORAL | Status: DC
  Administered 2020-01-01 – 2020-01-02 (×3): 50 mg via ORAL
  Filled 2019-12-31 (×3): qty 1

## 2019-12-31 MED ORDER — LISINOPRIL 10 MG PO TABS
10.0000 mg | ORAL_TABLET | Freq: Every day | ORAL | Status: DC
  Administered 2020-01-02 – 2020-01-03 (×2): 10 mg via ORAL
  Filled 2019-12-31 (×2): qty 1

## 2019-12-31 MED ORDER — MEMANTINE HCL 10 MG PO TABS
10.0000 mg | ORAL_TABLET | Freq: Two times a day (BID) | ORAL | Status: DC
  Administered 2020-01-01 – 2020-01-03 (×5): 10 mg via ORAL
  Filled 2019-12-31 (×5): qty 1

## 2019-12-31 MED ORDER — DOCUSATE SODIUM 100 MG PO CAPS
100.0000 mg | ORAL_CAPSULE | Freq: Two times a day (BID) | ORAL | Status: DC
  Administered 2020-01-01 – 2020-01-03 (×5): 100 mg via ORAL
  Filled 2019-12-31 (×5): qty 1

## 2019-12-31 MED ORDER — FENTANYL CITRATE (PF) 100 MCG/2ML IJ SOLN
50.0000 ug | Freq: Once | INTRAMUSCULAR | Status: AC
  Administered 2019-12-31: 50 ug via INTRAVENOUS
  Filled 2019-12-31: qty 2

## 2019-12-31 MED ORDER — LEVOTHYROXINE SODIUM 25 MCG PO TABS
25.0000 ug | ORAL_TABLET | Freq: Every day | ORAL | Status: DC
  Administered 2020-01-01 – 2020-01-02 (×2): 25 ug via ORAL
  Filled 2019-12-31 (×3): qty 1

## 2019-12-31 MED ORDER — QUETIAPINE FUMARATE 25 MG PO TABS
25.0000 mg | ORAL_TABLET | Freq: Every day | ORAL | Status: DC
  Administered 2020-01-01 (×2): 25 mg via ORAL
  Filled 2019-12-31 (×3): qty 1

## 2019-12-31 MED ORDER — ACETAMINOPHEN 650 MG RE SUPP: 650.0000 mg | Freq: Four times a day (QID) | RECTAL | Status: DC | PRN

## 2019-12-31 NOTE — Consult Note (Signed)
Reason for Consult:right tibia fracture Referring Physician: EDP  Haley Cohen is an 86 y.o. female.  HPI: 85 yo female presents to the [facility] after fall in her memory care unit complaining of severe pain and inability to stand after the fall. No other complaints. She also fell last year and sustained a hip fracture that was nailed by Dr Lyla Glassing in our practice. We were asked to consult on this long bone injury. Patient does not recall the reason for her fall.  Past Medical History:  Diagnosis Date  . Alzheimer disease ([place]) 10/27/2016  . Arthritis   . Confusion 04/04/2019  . Depression, recurrent ([place]) 10/27/2016  . HTN (hypertension) 10/27/2016  . Hypertension   . Hypertensive kidney disease with CKD (chronic kidney disease) stage V ([place]) 11/02/2016   11/02/16 Na 136, K 4.7, Bun 30, creat 1.52, TSH 0.86, wbc 6.7, Hgb 11.3, plt 264  . Hypothyroidism 10/27/2016  . Osteoarthritis 10/27/2016  . Thyroid disease     Past Surgical History:  Procedure Laterality Date  . ABDOMINAL HYSTERECTOMY  1986  . INTRAMEDULLARY (IM) NAIL INTERTROCHANTERIC Right 04/04/2019   Procedure: INTRAMEDULLARY (IM) NAIL INTERTROCHANTRIC;  Surgeon: Rod Can, MD;  Location: [facility];  Service: Orthopedics;  Laterality: Right;  . KNEE ARTHROSCOPY  2015/2017   Dr. Gustavus Bryant  . SPINE SURGERY  2009, 2011   Dr.Mark Nicoletta Dress    Family History  Problem Relation Age of Onset  . Cancer Mother     Social History:  reports that she has never smoked. She has never used smokeless tobacco. She reports previous alcohol use. She reports that she does not use drugs.  Allergies: Not on File  Medications: I have reviewed the patient's current medications.  Results for orders placed or performed during the hospital encounter of 12/31/19 (from the past 48 hour(s))  SARS Coronavirus 2 by RT PCR (hospital order, performed in [facility] hospital lab) Nasopharyngeal Nasopharyngeal Swab     Status: None   Collection Time: 12/31/19   6:52 PM   Specimen: Nasopharyngeal Swab  Result Value Ref Range   SARS Coronavirus 2 NEGATIVE NEGATIVE    Comment: (NOTE) SARS-CoV-2 target nucleic acids are NOT DETECTED. The SARS-CoV-2 RNA is generally detectable in upper and lower respiratory specimens during the acute phase of infection. The lowest concentration of SARS-CoV-2 viral copies this assay can detect is 250 copies / mL. A negative result does not preclude SARS-CoV-2 infection and should not be used as the sole basis for treatment or other patient management decisions.  A negative result may occur with improper specimen collection / handling, submission of specimen other than nasopharyngeal swab, presence of viral mutation(s) within the areas targeted by this assay, and inadequate number of viral copies (<250 copies / mL). A negative result must be combined with clinical observations, patient history, and epidemiological information. Fact Sheet for Patients:   StrictlyIdeas.no Fact Sheet for Healthcare Providers: BankingDealers.co.za This test is not yet approved or cleared  by the [place] FDA and has been authorized for detection and/or diagnosis of SARS-CoV-2 by FDA under an Emergency Use Authorization (EUA).  This EUA will remain in effect (meaning this test can be used) for the duration of the COVID-19 declaration under Section 564(b)(1) of the Act, 21 U.S.C. section 360bbb-3(b)(1), unless the authorization is terminated or revoked sooner. Performed at [facility], [place] [name], [place], [place] [address]   Type and screen [place]     Status: None   Collection  Time: 12/31/19  6:55 PM  Result Value Ref Range   ABO/RH(D) O NEG    Antibody Screen NEG    Sample Expiration      01/03/2020,2359 Performed at [facility], [place] [address], [place], [place] [address]   ABO/Rh     Status: None   Collection Time: 12/31/19  6:55 PM   Result Value Ref Range   ABO/RH(D)      O NEG Performed at [place] [address], [place], [place] [address]   CBC     Status: None   Collection Time: 12/31/19  7:00 PM  Result Value Ref Range   WBC 10.1 4.0 - 10.5 K/uL   RBC 4.17 3.87 - 5.11 MIL/uL   Hemoglobin 12.5 12.0 - 15.0 g/dL   HCT 40.1 36.0 - 46.0 %   MCV 96.2 80.0 - 100.0 fL   MCH 30.0 26.0 - 34.0 pg   MCHC 31.2 30.0 - 36.0 g/dL   RDW 13.8 11.5 - 15.5 %   Platelets 217 150 - 400 K/uL   nRBC 0.0 0.0 - 0.2 %    Comment: Performed at [facility], [place] [address], [place], [place] [id number]  Basic metabolic panel     Status: Abnormal   Collection Time: 12/31/19  7:00 PM  Result Value Ref Range   Sodium 140 135 - 145 mmol/L   Potassium 4.5 3.5 - 5.1 mmol/L   Chloride 104 98 - 111 mmol/L   CO2 24 22 - 32 mmol/L   Glucose, Bld 112 (H) 70 - 99 mg/dL    Comment: Glucose reference range applies only to samples taken after fasting for at least 8 hours.   BUN 15 8 - 23 mg/dL   Creatinine, Ser 0.94 0.44 - 1.00 mg/dL   Calcium 10.4 (H) 8.9 - 10.3 mg/dL   GFR calc non Af Amer 55 (L) >60 mL/min   GFR calc Af Amer >60 >60 mL/min   Anion gap 12 5 - 15    Comment: Performed at [facility] [address] [place], [place] [address]  Protime-INR     Status: None   Collection Time: 12/31/19  7:00 PM  Result Value Ref Range   Prothrombin Time 13.1 11.4 - 15.2 seconds   INR 1.0 0.8 - 1.2    Comment: (NOTE) INR goal varies based on device and disease states. Performed at [facility], [place] [address], [place], [place] [address]     DG Ankle Complete Right  Result Date: 12/31/2019 CLINICAL DATA:  [place].  Tib fib deformity. EXAM: RIGHT ANKLE - COMPLETE 3+ VIEW COMPARISON:  None. FINDINGS: Displaced spiral type fracture of the mid distal tibial shaft and the mid fibular shaft. There is 1 shaft width of lateral and anterior displacement of the tibial fracture with a moderate-sized butterfly fragment. The  ankle and hindfoot bony structures are intact. IMPRESSION: Displaced spiral type fractures of the mid distal tibial shaft and the mid fibula. Electronically Signed   By: Marijo Sanes M.D.   On: 12/31/2019 18:51   DG Knee Complete 4 Views Right  Result Date: 12/31/2019 CLINICAL DATA:  Knee pain after fall EXAM: RIGHT KNEE - COMPLETE 4+ VIEW COMPARISON:  None. FINDINGS: Normal appearance of right total knee arthroplasty. There is a laterally displaced oblique fracture of the midshaft of the right fibula. No knee effusion. IMPRESSION: Laterally displaced oblique fracture of the midshaft of the right fibula. Right total  knee arthroplasty without adverse features. Electronically Signed   By: Ulyses Jarred M.D.   On: 12/31/2019 19:39   DG Toe Great Right  Result Date: 12/31/2019 CLINICAL DATA:  Fall with toe pain EXAM: RIGHT GREAT TOE COMPARISON:  None. FINDINGS: There is no evidence of fracture or dislocation. There is no evidence of arthropathy or other focal bone abnormality. Soft tissues are unremarkable. IMPRESSION: Negative. Electronically Signed   By: Ulyses Jarred M.D.   On: 12/31/2019 19:38    Review of Systems Blood pressure (!) 121/100, pulse 80, temperature 98.7 F (37.1 C), resp. rate 18, SpO2 98 %. Physical Exam Patient is awake and responds well to commands. She is in moderate distress due to pain from her right leg injury. Neck non tender with pain free AROM Bilateral shoulders with no swelling and no tenderness, normal AROM and good strength in her UEs. No pain with AROM of the elbows or wrists, good grip strength bilaterally Right LE with obvious deformity of the leg below the knee consistent with a closed tib/fib fracture. No breaks in the skin but some evidence of mild venous stasis. Moderate swelling and some early bruising.  Distally grossly NVI Healed right knee midline incision from knee replacement surgery. Left leg with no deformity and pain free AROM of the hip and knee and  ankle  Assessment/Plan: 86 yo patient with dementia and a history of frequent falls who presents with a closed and neurovascularly intact displaced midshaft tibia and fibula fracture.  Will consult with ortho trauma in the AM to determine surgical plan. The presence of the TKR complicates matters as far as potential IM nailing. May need percutaneous plating.  Fracture was reduce without difficulty in the trauma bay and well padded L and U splint applied.  Patient much more comfortable after the splinting.  Maintain NPO after MN please. No anticoagulation for possible surgery tomorrow.  Augustin Schooling 12/31/2019, 9:10 PM

## 2019-12-31 NOTE — ED Triage Notes (Signed)
Pt BIB [facility] from [facility] memory care unit, pt had a fall today. Obvious deformity to left ankle. EMS reports they were unable pt palpate a pedal pulse, good color and cap refill at this time. Given 137mcg fentanyl pta, pain relieved.

## 2019-12-31 NOTE — ED Notes (Signed)
Ortho paged for splint 

## 2019-12-31 NOTE — Progress Notes (Signed)
Location:  [facility] Room Number: 106-A Place of Service:  SNF [phone]) Provider:  [name], MD  Patient Care Team: [name], MD as PCP - General (Internal Medicine) Mast, Man X, NP as Nurse Practitioner (Internal Medicine) [name], MD (Internal Medicine)  Extended Emergency Contact Information Primary Emergency Contact: [facility] Address: [address]          [place], [place] [address] [name] of [place] Phone: [phone] Relation: Son  Code Status:  DNR Goals of care: Advanced Directive information Advanced Directives 12/31/2019  Does Patient Have a Medical Advance Directive? Yes  Type of Paramedic of [place];Living will;Out of facility DNR (pink MOST or yellow form)  Does patient want to make changes to medical advance directive? No - Patient declined  Copy of [place] in Chart? Yes - validated most recent copy scanned in chart (See row information)  Pre-existing out of facility DNR order (yellow form or pink MOST form) -     Chief Complaint  Patient presents with  . Acute Visit    Patient is seen for lower extremity swelling.and Fall    HPI:  Pt is an 86 y.o. female seen today for an acute visit for Fall. Patient was initially seen for LE swelling but then she had a fall and now has pain and Possible;le fracture of distal Tibia.  Patient has h/o Hypertension, Hypothyroidism, Alzheimer's Dementia with behavior issues, Depression, B12 def, S/P Right Hip fracture with Intramedullary Fixation in 8/27 Patient lives in a memory unit And Has severe Behavior issues including trying to leave the facility and not sleeping whole nights.  Patient was walking with her walker in her room when she just fell. No LOC.No Dizziness. Nurses were able to get help for her when they noticed she had swelling and pain in her Right Lower leg  Past Medical History:  Diagnosis Date  . Alzheimer  disease ([place]) 10/27/2016  . Arthritis   . Confusion 04/04/2019  . Depression, recurrent ([place]) 10/27/2016  . HTN (hypertension) 10/27/2016  . Hypertension   . Hypertensive kidney disease with CKD (chronic kidney disease) stage V ([place]) 11/02/2016   11/02/16 Na 136, K 4.7, Bun 30, creat 1.52, TSH 0.86, wbc 6.7, Hgb 11.3, plt 264  . Hypothyroidism 10/27/2016  . Osteoarthritis 10/27/2016  . Thyroid disease    Past Surgical History:  Procedure Laterality Date  . ABDOMINAL HYSTERECTOMY  1986  . INTRAMEDULLARY (IM) NAIL INTERTROCHANTERIC Right 04/04/2019   Procedure: INTRAMEDULLARY (IM) NAIL INTERTROCHANTRIC;  Surgeon: Rod Can, MD;  Location: [facility];  Service: Orthopedics;  Laterality: Right;  . KNEE ARTHROSCOPY  2015/2017   Dr. Gustavus Bryant  . SPINE SURGERY  2009, 2011   Dr.Mark Nicoletta Dress    No Known Allergies  Outpatient Encounter Medications as of 12/31/2019  Medication Sig  . acetaminophen (TYLENOL) 325 MG tablet Take 650 mg by mouth 2 (two) times daily.  . Calcium Carbonate (CALCIUM 600 PO) Take 1 tablet by mouth daily.  . cholecalciferol (VITAMIN D3) 25 MCG (1000 UT) tablet Take 1,000 Units by mouth daily.  . divalproex (DEPAKOTE) 250 MG DR tablet Take 250 mg by mouth in the morning and at bedtime.  . docusate sodium (COLACE) 100 MG capsule Take 1 capsule (100 mg total) by mouth 2 (two) times daily.  . DULoxetine (CYMBALTA) 30 MG capsule Take 30 mg by mouth daily.  Marland Kitchen levothyroxine (SYNTHROID, LEVOTHROID) 25 MCG tablet Take 25 mcg by  mouth daily before breakfast.  . lisinopril (ZESTRIL) 10 MG tablet Take 10 mg by mouth daily.  Marland Kitchen LORazepam (ATIVAN) 1 MG tablet Take 1 mg by mouth at bedtime.  . memantine (NAMENDA) 10 MG tablet Take 10 mg by mouth 2 (two) times daily.   . QUEtiapine (SEROQUEL) 25 MG tablet Take 25 mg by mouth at bedtime.   . traZODone (DESYREL) 50 MG tablet Take 50 mg by mouth at bedtime.   . vitamin B-12 (CYANOCOBALAMIN) 1000 MCG tablet Take 1,000 mcg by mouth. Mondays and  Wednesdays  . vitamin C (ASCORBIC ACID) 500 MG tablet Take 500 mg by mouth daily.   No facility-administered encounter medications on file as of 12/31/2019.    Review of Systems  Unable to perform ROS: Acuity of condition    Immunization History  Administered Date(s) Administered  . Influenza-Unspecified 05/29/2017  . Moderna SARS-COVID-2 Vaccination 09/07/2019, 10/05/2019  . Pneumococcal Conjugate-13 06/21/2017  . Pneumococcal Polysaccharide-23 07/16/2018  . Tdap 06/21/2017   Pertinent  Health Maintenance Due  Topic Date Due  . DEXA SCAN  Completed  . PNA vac Low Risk Adult  Completed  . INFLUENZA VACCINE  Discontinued   Fall Risk  04/27/2018 04/25/2017  Falls in the past year? No No   Functional Status Survey:    Vitals:   12/31/19 1202  BP: 134/60  Pulse: 76  Resp: 20  Temp: 99 F (37.2 C)  TempSrc: Oral  SpO2: 95%  Weight: 154 lb 3.2 oz (69.9 kg)  Height: 4\' 11"  (1.499 m)   Body mass index is 31.14 kg/m. Physical Exam Vitals reviewed.  Constitutional:      Appearance: Normal appearance.  HENT:     Head: Normocephalic.     Mouth/Throat:     Mouth: Mucous membranes are moist.     Pharynx: Oropharynx is clear.  Eyes:     Pupils: Pupils are equal, round, and reactive to light.  Cardiovascular:     Rate and Rhythm: Normal rate and regular rhythm.     Pulses: Normal pulses.  Pulmonary:     Effort: Pulmonary effort is normal.     Breath sounds: Normal breath sounds.  Abdominal:     General: Abdomen is flat. Bowel sounds are normal.     Palpations: Abdomen is soft.  Musculoskeletal:        General: Swelling present.     Cervical back: Neck supple.  Skin:    General: Skin is warm.  Neurological:     General: No focal deficit present.     Mental Status: She is alert.     Comments: Confused but lets her needs know. Can get aggressive with staff sometimes. Walks with walker at baseline  Psychiatric:        Mood and Affect: Mood normal.     Labs  reviewed: Recent Labs    04/04/19 0051 04/04/19 0051 04/05/19 0213 04/05/19 0213 04/06/19 0226 04/11/19 0000 04/25/19 0000 05/25/19 0000 07/30/19 0000 11/07/19 0000  NA 139   < > 138   < > 137   < > 139  --  143 141  K 3.7   < > 4.5   < > 4.0   < > 4.3  --  4.5 4.5  CL 105   < > 107   < > 105  --   --  106 107 106  CO2 22   < > 23   < > 26  --   --  27 27*  30*  GLUCOSE 132*  --  148*  --  119*  --   --   --   --   --   BUN 13   < > 13   < > 18   < > 13  --  17 18  CREATININE 0.83   < > 0.83   < > 0.78   < > 0.8  --  0.9 1.0  CALCIUM 10.0   < > 9.1   < > 9.2  --   --  10.0  --  10.0   < > = values in this interval not displayed.   Recent Labs    07/30/19 0000 11/07/19 0000  AST 11* 10*  ALT 8 7  ALKPHOS 117 92  ALBUMIN 3.8  --    Recent Labs    04/05/19 0213 04/05/19 0213 04/06/19 0226 04/06/19 0226 04/07/19 0324 04/11/19 0000 04/25/19 0000 04/25/19 0000 07/30/19 0000 09/03/19 0000 11/07/19 0000  WBC 11.4*   < > 9.0   < > 8.0   < > 6.3   < > 6.1 7.2 5.3  NEUTROABS  --   --   --   --   --   --  4,145  --  3,630  --  2,417  HGB 10.9*   < > 9.4*   < > 8.9*   < > 10.3*   < > 12.7 12.4 11.8*  HCT 34.7*   < > 30.3*   < > 29.0*   < > 32*   < > 39 37 36  MCV 95.1  --  94.7  --  96.0  --   --   --   --   --   --   PLT 199   < > 170   < > 177   < > 369   < > 232 226 221   < > = values in this interval not displayed.   Lab Results  Component Value Date   TSH 2.43 04/11/2019   Lab Results  Component Value Date   HGBA1C 5.3 11/01/2016   No results found for: CHOL, HDL, LDLCALC, LDLDIRECT, TRIG, CHOLHDL  Significant Diagnostic Results in last 30 days:  No results found.  Assessment/Plan   Fall With Injury Will send to ED for further Eval   Family/ staff Communication:   Labs/tests ordered:

## 2019-12-31 NOTE — H&P (Signed)
History and Physical    Haley Cohen [id number] DOB: Dec 25, 1931 DOA: 12/31/2019  PCP: [name], MD  Patient coming from: [facility]    Chief Complaint:  Chief Complaint  Patient presents with  . Fall  . Ankle Pain     HPI:    86 year old female with past medical history of Alzheimer's dementia, hypertension, hypothyroidism, osteoarthritis, depression who presents to [facility] emergency department status post fall with right lower extremity pain.  She is an extremely poor historian secondary to advanced dementia.  According to the emergency department staff, patient presented status post fall with right lower extremity pain, presumed to be mechanical in nature.  Patient herself is unable to provide any further details concerning this fall.  Patient has a notable history of right hip fracture under similar circumstances in 2020.  Upon evaluation in the emergency department, patient has undergone radiographic work-up revealing right midshaft fibular fracture as well as right mid distal tibial shaft fracture.  Dr. Alma Friendly with orthopedic surgery has already been contacted who is already evaluated the patient in a splint has been placed of the right lower extremity.  Hospitalist group has now been called to assess the patient for admission the hospital.   Review of Systems: Unable to obtain due to advanced dementia.   Past Medical History:  Diagnosis Date  . Alzheimer disease ([place]) 10/27/2016  . Arthritis   . Confusion 04/04/2019  . Depression, recurrent ([place]) 10/27/2016  . History of fracture of right hip 04/04/2019   07/01/19 Ortho, R hip incision healed, X-ray looks good, s/p IMN R hip fx, WBAT, f/u 6 mos.   Marland Kitchen HTN (hypertension) 10/27/2016  . Hypertension   . Hypertensive kidney disease with CKD (chronic kidney disease) stage V ([place]) 11/02/2016   11/02/16 Na 136, K 4.7, Bun 30, creat 1.52, TSH 0.86, wbc 6.7, Hgb 11.3, plt 264  . Hypothyroidism 10/27/2016  .  Osteoarthritis 10/27/2016  . Thyroid disease     Past Surgical History:  Procedure Laterality Date  . ABDOMINAL HYSTERECTOMY  1986  . INTRAMEDULLARY (IM) NAIL INTERTROCHANTERIC Right 04/04/2019   Procedure: INTRAMEDULLARY (IM) NAIL INTERTROCHANTRIC;  Surgeon: Rod Can, MD;  Location: [facility] ORS;  Service: Orthopedics;  Laterality: Right;  . KNEE ARTHROSCOPY  2015/2017   Dr. Gustavus Bryant  . SPINE SURGERY  2009, 2011   Dr.Mark Nicoletta Dress     reports that she has never smoked. She has never used smokeless tobacco. She reports previous alcohol use. She reports that she does not use drugs.  No Known Allergies  Family History  Problem Relation Age of Onset  . Cancer Mother      Prior to Admission medications   Medication Sig Start Date End Date Taking? Authorizing Provider  acetaminophen (TYLENOL) 325 MG tablet Take 650 mg by mouth 2 (two) times daily.   Yes [provider]  Calcium Carbonate (CALCIUM 600 PO) Take 1 tablet by mouth daily.   Yes [provider]  cholecalciferol (VITAMIN D3) 25 MCG (1000 UT) tablet Take 1,000 Units by mouth daily.   Yes [provider]  divalproex (DEPAKOTE) 250 MG DR tablet Take 250 mg by mouth in the morning and at bedtime.   Yes [provider]  docusate sodium (COLACE) 100 MG capsule Take 1 capsule (100 mg total) by mouth 2 (two) times daily. 04/05/19  Yes Donne Hazel, MD  DULoxetine (CYMBALTA) 30 MG capsule Take 30 mg by mouth daily. 04/08/19  Yes [provider]  levothyroxine (  SYNTHROID, LEVOTHROID) 25 MCG tablet Take 25 mcg by mouth daily before breakfast.   Yes [provider]  lisinopril (ZESTRIL) 10 MG tablet Take 10 mg by mouth daily.   Yes [provider]  LORazepam (ATIVAN) 1 MG tablet Take 1 mg by mouth at bedtime.   Yes [provider]  memantine (NAMENDA) 10 MG tablet Take 10 mg by mouth 2 (two) times daily.    Yes [provider]  QUEtiapine (SEROQUEL) 25 MG tablet  Take 25 mg by mouth at bedtime.    Yes [provider]  traZODone (DESYREL) 50 MG tablet Take 50 mg by mouth at bedtime.  04/07/19  Yes [provider]  vitamin B-12 (CYANOCOBALAMIN) 1000 MCG tablet Take 1,000 mcg by mouth. Mondays and Wednesdays 04/08/19  Yes [provider]  vitamin C (ASCORBIC ACID) 500 MG tablet Take 500 mg by mouth daily. 04/08/19  Yes [provider]    Physical Exam: Vitals:   12/31/19 2009 12/31/19 2030 12/31/19 2115 12/31/19 2130  BP: (!) 121/100 (!) 167/91  (!) 182/87  Pulse: 80 77 88 78  Resp: 18   15  Temp:      SpO2: 98% 96% 100% 95%    Constitutional: Awake, alert and disoriented.  Patient is currently in no associated distress. Skin: no rashes, no lesions, good skin turgor noted. Eyes: Pupils are equally reactive to light.  No evidence of scleral icterus or conjunctival pallor.  ENMT: Moist mucous membranes noted.  Posterior pharynx clear of any exudate or lesions.   Neck: normal, supple, no masses, no thyromegaly.  No evidence of jugular venous distension.   Respiratory: clear to auscultation bilaterally, no wheezing, no crackles. Normal respiratory effort. No accessory muscle use.  Cardiovascular: Regular rate and rhythm, no murmurs / rubs / gallops. No extremity edema. 2+ pedal pulses. No carotid bruits.  Chest:   Nontender without crepitus or deformity.   Back:   Nontender without crepitus or deformity. Abdomen: Abdomen is soft and nontender.  No evidence of intra-abdominal masses.  Positive bowel sounds noted in all quadrants.   Musculoskeletal: Right lower extremity in splint.  Good capillary refill of the distal right lower extremity noted.  Pain with both passive and active range of motion of the right lower extremity.    Neurologic: Patient is disoriented.  Sensation intact, patient is able to move all 4 extremities spontaneously.  Patient is following all commands.  Patient is responsive to verbal stimuli.     Psychiatric: Patient presents as a slightly angry mood with appropriate affect.  Patient currently does not seem to possess insight as to her current situation.     Labs on Admission: I have personally reviewed following labs and imaging studies -   CBC: Recent Labs  Lab 12/31/19 1900  WBC 10.1  HGB 12.5  HCT 40.1  MCV 96.2  PLT [id number]   Basic Metabolic Panel: Recent Labs  Lab 12/31/19 1900  NA 140  K 4.5  CL 104  CO2 24  GLUCOSE 112*  BUN 15  CREATININE 0.94  CALCIUM 10.4*   GFR: Estimated Creatinine Clearance: 36.6 mL/min (by C-G formula based on SCr of 0.94 mg/dL). Liver Function Tests: No results for input(s): AST, ALT, ALKPHOS, BILITOT, PROT, ALBUMIN in the last 168 hours. No results for input(s): LIPASE, AMYLASE in the last 168 hours. No results for input(s): AMMONIA in the last 168 hours. Coagulation Profile: Recent Labs  Lab 12/31/19 1900  INR 1.0   Cardiac  Enzymes: No results for input(s): CKTOTAL, CKMB, CKMBINDEX, TROPONINI in the last 168 hours. BNP (last 3 results) No results for input(s): PROBNP in the last 8760 hours. HbA1C: No results for input(s): HGBA1C in the last 72 hours. CBG: No results for input(s): GLUCAP in the last 168 hours. Lipid Profile: No results for input(s): CHOL, HDL, LDLCALC, TRIG, CHOLHDL, LDLDIRECT in the last 72 hours. Thyroid Function Tests: No results for input(s): TSH, T4TOTAL, FREET4, T3FREE, THYROIDAB in the last 72 hours. Anemia Panel: No results for input(s): VITAMINB12, FOLATE, FERRITIN, TIBC, IRON, RETICCTPCT in the last 72 hours. Urine analysis:    Component Value Date/Time   COLORURINE YELLOW 04/05/2019 0430   APPEARANCEUR CLEAR 04/05/2019 0430   LABSPEC 1.021 04/05/2019 0430   PHURINE 5.0 04/05/2019 0430   GLUCOSEU NEGATIVE 04/05/2019 0430   HGBUR SMALL (A) 04/05/2019 0430   BILIRUBINUR NEGATIVE 04/05/2019 0430   KETONESUR NEGATIVE 04/05/2019 0430   PROTEINUR NEGATIVE 04/05/2019 0430   NITRITE NEGATIVE  04/05/2019 0430   LEUKOCYTESUR SMALL (A) 04/05/2019 0430    Radiological Exams on Admission - Personally Reviewed: DG Ankle Complete Right  Result Date: 12/31/2019 CLINICAL DATA:  [place].  Tib fib deformity. EXAM: RIGHT ANKLE - COMPLETE 3+ VIEW COMPARISON:  None. FINDINGS: Displaced spiral type fracture of the mid distal tibial shaft and the mid fibular shaft. There is 1 shaft width of lateral and anterior displacement of the tibial fracture with a moderate-sized butterfly fragment. The ankle and hindfoot bony structures are intact. IMPRESSION: Displaced spiral type fractures of the mid distal tibial shaft and the mid fibula. Electronically Signed   By: Marijo Sanes M.D.   On: 12/31/2019 18:51   DG Knee Complete 4 Views Right  Result Date: 12/31/2019 CLINICAL DATA:  Knee pain after fall EXAM: RIGHT KNEE - COMPLETE 4+ VIEW COMPARISON:  None. FINDINGS: Normal appearance of right total knee arthroplasty. There is a laterally displaced oblique fracture of the midshaft of the right fibula. No knee effusion. IMPRESSION: Laterally displaced oblique fracture of the midshaft of the right fibula. Right total knee arthroplasty without adverse features. Electronically Signed   By: Ulyses Jarred M.D.   On: 12/31/2019 19:39   DG Toe Great Right  Result Date: 12/31/2019 CLINICAL DATA:  Fall with toe pain EXAM: RIGHT GREAT TOE COMPARISON:  None. FINDINGS: There is no evidence of fracture or dislocation. There is no evidence of arthropathy or other focal bone abnormality. Soft tissues are unremarkable. IMPRESSION: Negative. Electronically Signed   By: Ulyses Jarred M.D.   On: 12/31/2019 19:38    Telemetry: Personally reviewed, normal sinus rhythm.  Assessment/Plan Active Problems:   Fall   Patient has suffered from what sounds to be a mechanical fall at her assisted living facility  This has resulted in a notable right fibula fracture as well as right tibial shaft fracture.  No obvious clinical evidence  of underlying medical cause of fall.  Labs mostly unremarkable with exception of mild hypercalcemia.  Telemetry unremarkable, will order EKG.  Patient is already been evaluated by Dr. Alma Friendly with orthopedic surgery, splint has been placed  Low-dose as needed analgesics for associated pain  N.p.o. after midnight in anticipation for surgery tomorrow    Right fibular fracture   Please see assessment and plan above    Fracture of tibial shaft, right, closed   Please see assessment and plan above    Essential hypertension   Continue home regimen of antihypertensive therapy    Hypothyroidism   Continue home  regimen of Synthroid    Mixed Alzheimer's and vascular dementia ([place])   Minimizing uncomfortable stimuli as much as possible  Attempted to ensure pain is well controlled  Patient's advanced dementia has been discussed with her son who reports that patient is currently at baseline.    Chronic kidney disease, stage 3a   Renal function at baseline  Strict input and output monitoring  Monitoring renal function and electrolytes with serial chemistries    Hypercalcemia   Mild hypercalcemia, likely transient secondary to multiple fractures  During patient gently with intravenous isotonic fluids  Monitoring calcium levels with serial chemistries    Code Status:  DNR Family Communication: Case has been discussed with patient's son Haley Cohen via phone conversation  Status is: Observation  The patient remains OBS appropriate and will d/c before 2 midnights.  Dispo: The patient is from: SNF              Anticipated d/c is to: SNF              Anticipated d/c date is: 2 days              Patient currently is not medically stable to d/c.        Vernelle Emerald MD [facility] Pager [phone]  If 7PM-7AM, please contact night-coverage [email or web address] Use universal [place] password for that web site. If you do not have the password, please call the  hospital operator.  12/31/2019, 10:00 PM

## 2019-12-31 NOTE — ED Notes (Signed)
Leighton Ruff son would like an update  [phone]

## 2019-12-31 NOTE — ED Provider Notes (Signed)
[place] [facility] EMERGENCY DEPARTMENT Provider Note   CSN: [id number] Arrival date & time: 12/31/19  1757     History Chief Complaint  Patient presents with  . Fall  . Ankle Pain    Haley Cohen is a 86 y.o. female.  Presented to emergency department with concern for fall, ankle pain.  History limited due to patient's dementia.  Patient has dementia, lives in a memory care facility.  Patient denies any pain.  Not able to recall details of fall.  Past medical history Alzheimer's, hypertension, CKD, hypothyroidism.  Right knee replacement many years ago in [place], right hip fracture, [place] performed right IM nail last fall.  Additional history obtained via chart review, discussion with patient's son by phone.  HPI     Past Medical History:  Diagnosis Date  . Alzheimer disease ([place]) 10/27/2016  . Arthritis   . Confusion 04/04/2019  . Depression, recurrent ([place]) 10/27/2016  . HTN (hypertension) 10/27/2016  . Hypertension   . Hypertensive kidney disease with CKD (chronic kidney disease) stage V ([place]) 11/02/2016   11/02/16 Na 136, K 4.7, Bun 30, creat 1.52, TSH 0.86, wbc 6.7, Hgb 11.3, plt 264  . Hypothyroidism 10/27/2016  . Osteoarthritis 10/27/2016  . Thyroid disease     Patient Active Problem List   Diagnosis Date Noted  . CKD (chronic kidney disease) stage 3, GFR 30-59 ml/min 11/11/2019  . Slow transit constipation 09/25/2019  . Blood loss anemia 04/19/2019  . Mixed Alzheimer's and vascular dementia ([place]) 04/09/2019  . Gait abnormality 04/09/2019  . History of fracture of right hip 04/04/2019  . HTN (hypertension) 04/04/2019  . Hypothyroidism 04/04/2019  . Edema 07/11/2018  . Insomnia 06/15/2018  . Incontinent of urine 04/27/2018  . Fall 02/02/2018  . B12 deficiency 10/17/2017  . Depression, recurrent ([place]) 10/27/2016  . Osteoarthritis 10/27/2016    Past Surgical History:  Procedure Laterality Date  . ABDOMINAL HYSTERECTOMY  1986  .  INTRAMEDULLARY (IM) NAIL INTERTROCHANTERIC Right 04/04/2019   Procedure: INTRAMEDULLARY (IM) NAIL INTERTROCHANTRIC;  Surgeon: Rod Can, MD;  Location: [facility];  Service: Orthopedics;  Laterality: Right;  . KNEE ARTHROSCOPY  2015/2017   Dr. Gustavus Bryant  . SPINE SURGERY  2009, 2011   Dr.Mark Nicoletta Dress     OB History   No obstetric history on file.     Family History  Problem Relation Age of Onset  . Cancer Mother     Social History   Tobacco Use  . Smoking status: Never Smoker  . Smokeless tobacco: Never Used  Substance Use Topics  . Alcohol use: Not Currently  . Drug use: Never    Home Medications Prior to Admission medications   Medication Sig Start Date End Date Taking? Authorizing Provider  acetaminophen (TYLENOL) 325 MG tablet Take 650 mg by mouth 2 (two) times daily.    [provider]  Calcium Carbonate (CALCIUM 600 PO) Take 1 tablet by mouth daily.    [provider]  cholecalciferol (VITAMIN D3) 25 MCG (1000 UT) tablet Take 1,000 Units by mouth daily.    [provider]  divalproex (DEPAKOTE) 250 MG DR tablet Take 250 mg by mouth in the morning and at bedtime.    [provider]  docusate sodium (COLACE) 100 MG capsule Take 1 capsule (100 mg total) by mouth 2 (two) times daily. 04/05/19   Donne Hazel, MD  DULoxetine (CYMBALTA) 30 MG capsule Take 30 mg by mouth daily. 04/08/19   [provider]  levothyroxine (  SYNTHROID, LEVOTHROID) 25 MCG tablet Take 25 mcg by mouth daily before breakfast.    [provider]  lisinopril (ZESTRIL) 10 MG tablet Take 10 mg by mouth daily.    [provider]  LORazepam (ATIVAN) 1 MG tablet Take 1 mg by mouth at bedtime.    [provider]  memantine (NAMENDA) 10 MG tablet Take 10 mg by mouth 2 (two) times daily.     [provider]  QUEtiapine (SEROQUEL) 25 MG tablet Take 25 mg by mouth at bedtime.     [provider]  traZODone (DESYREL) 50 MG tablet Take  50 mg by mouth at bedtime.  04/07/19   [provider]  vitamin B-12 (CYANOCOBALAMIN) 1000 MCG tablet Take 1,000 mcg by mouth. Mondays and Wednesdays 04/08/19   [provider]  vitamin C (ASCORBIC ACID) 500 MG tablet Take 500 mg by mouth daily. 04/08/19   [provider]    Allergies    Patient has no known allergies.  Review of Systems   Review of Systems  Unable to perform ROS: Dementia    Physical Exam Updated Vital Signs BP (!) 121/100   Pulse 80   Temp 98.7 F (37.1 C)   Resp 18   SpO2 98%   Physical Exam Vitals and nursing note reviewed.  Constitutional:      General: She is not in acute distress.    Appearance: She is well-developed.  HENT:     Head: Normocephalic and atraumatic.  Eyes:     Conjunctiva/sclera: Conjunctivae normal.  Cardiovascular:     Rate and Rhythm: Normal rate and regular rhythm.     Heart sounds: No murmur.  Pulmonary:     Effort: Pulmonary effort is normal. No respiratory distress.     Breath sounds: Normal breath sounds.  Abdominal:     Palpations: Abdomen is soft.     Tenderness: There is no abdominal tenderness.  Musculoskeletal:     Cervical back: Neck supple.     Comments: Back: no C, T, L spine TTP, no step off or deformity RUE: no TTP throughout, no deformity, normal joint ROM, radial pulse intact, distal sensation and motor intact LUE: no TTP throughout, no deformity, normal joint ROM, radial pulse intact, distal sensation and motor intact  RLE:  Obvious deformity to mid lower leg, distal leg is slightly externally rotated, brisk doppler signal in DP and PT pulses, good cap refill, distal motor intact, slight bruise over great toe, mild TTP over knee but no knee deformity  LLE: no TTP throughout, no deformity, normal joint ROM, distal pulse, sensation and motor intact  Skin:    General: Skin is warm and dry.  Neurological:     Mental Status: She is alert.     ED Results / Procedures / Treatments     Labs (all labs ordered are listed, but only abnormal results are displayed) Labs Reviewed  BASIC METABOLIC PANEL - Abnormal; Notable for the following components:      Result Value   Glucose, Bld 112 (*)    Calcium 10.4 (*)    GFR calc non Af Amer 55 (*)    All other components within normal limits  SARS CORONAVIRUS 2 BY RT PCR (HOSPITAL ORDER, [facility])  CBC  PROTIME-INR  TYPE AND SCREEN  ABO/RH    EKG None  Radiology DG Ankle Complete Right  Result Date: 12/31/2019 CLINICAL DATA:  [place].  Tib fib deformity. EXAM: RIGHT ANKLE -  COMPLETE 3+ VIEW COMPARISON:  None. FINDINGS: Displaced spiral type fracture of the mid distal tibial shaft and the mid fibular shaft. There is 1 shaft width of lateral and anterior displacement of the tibial fracture with a moderate-sized butterfly fragment. The ankle and hindfoot bony structures are intact. IMPRESSION: Displaced spiral type fractures of the mid distal tibial shaft and the mid fibula. Electronically Signed   By: Marijo Sanes M.D.   On: 12/31/2019 18:51   DG Knee Complete 4 Views Right  Result Date: 12/31/2019 CLINICAL DATA:  Knee pain after fall EXAM: RIGHT KNEE - COMPLETE 4+ VIEW COMPARISON:  None. FINDINGS: Normal appearance of right total knee arthroplasty. There is a laterally displaced oblique fracture of the midshaft of the right fibula. No knee effusion. IMPRESSION: Laterally displaced oblique fracture of the midshaft of the right fibula. Right total knee arthroplasty without adverse features. Electronically Signed   By: Ulyses Jarred M.D.   On: 12/31/2019 19:39   DG Toe Great Right  Result Date: 12/31/2019 CLINICAL DATA:  Fall with toe pain EXAM: RIGHT GREAT TOE COMPARISON:  None. FINDINGS: There is no evidence of fracture or dislocation. There is no evidence of arthropathy or other focal bone abnormality. Soft tissues are unremarkable. IMPRESSION: Negative. Electronically Signed   By: Ulyses Jarred M.D.    On: 12/31/2019 19:38    Procedures Procedures (including critical care time)  Medications Ordered in ED Medications  fentaNYL (SUBLIMAZE) injection 50 mcg (50 mcg Intravenous Given 12/31/19 2010)    ED Course  I have reviewed the triage vital signs and the nursing notes.  Pertinent labs & imaging results that were available during my care of the patient were reviewed by me and considered in my medical decision making (see chart for details).  Clinical Course as of Dec 30 2041  Tue Dec 31, 2019  1944 Updated son   [RD]  2011 D/w Veverly Fells - requests,    [RD]  2034 Updated son, [phone]   [RD]    Clinical Course User Index [RD] Lucrezia Starch, MD   MDM Rules/Calculators/A&P                       86 year old lady presented to ER after fall, ankle injury.  On trauma assessment, only identified trauma was isolated to her right lower extremity.  Initially reported EMS was unable to obtain pulses.  Brisk Doppler signal obtained.  Foot was well-perfused, sensation and motor intact.  X-rays of her extremity were concerning for Displaced spiral type fractures of the mid distal tibial shaft and the mid fibula.  Discussed case with Dr. Veverly Fells with orthopedics, he will come to bedside to splint tonight, likely OR tomorrow.  Request hospitalist admission.  Will c/s hosp for admit.   Final Clinical Impression(s) / ED Diagnoses Final diagnoses:  Closed fracture of right tibia and fibula, initial encounter    Rx / DC Orders ED Discharge Orders    None       Lucrezia Starch, MD 12/31/19 2052

## 2019-12-31 NOTE — ED Notes (Signed)
Right lower leg pulse dopplered strong and marked.

## 2019-12-31 NOTE — Progress Notes (Signed)
Orthopedic Tech Progress Note Patient Details:  Haley Cohen 30-Oct-1931 [id number]  Ortho Devices Type of Ortho Device: Post (short leg) splint, Stirrup splint Ortho Device/Splint Location: rle. plaster splint. we assisted ortho dr with splint application. Ortho Device/Splint Interventions: Ordered, Application, Adjustment   Post Interventions Patient Tolerated: Well Instructions Provided: Care of device, Adjustment of device   Karolee Stamps 12/31/2019, 9:40 PM

## 2019-12-31 NOTE — ED Notes (Signed)
Ortho at bedside.

## 2020-01-01 ENCOUNTER — Inpatient Hospital Stay (HOSPITAL_COMMUNITY): Payer: Medicare Other

## 2020-01-01 ENCOUNTER — Observation Stay (HOSPITAL_COMMUNITY): Payer: Medicare Other

## 2020-01-01 ENCOUNTER — Encounter (HOSPITAL_COMMUNITY): Payer: Self-pay | Admitting: Internal Medicine

## 2020-01-01 ENCOUNTER — Observation Stay (HOSPITAL_COMMUNITY): Payer: Medicare Other | Admitting: Certified Registered Nurse Anesthetist

## 2020-01-01 ENCOUNTER — Encounter (HOSPITAL_COMMUNITY)
Admission: EM | Disposition: A | Discharge status: Skilled Nursing Facility | Payer: Self-pay | Source: Skilled Nursing Facility | Attending: Internal Medicine

## 2020-01-01 DIAGNOSIS — Z7401 Bed confinement status: Secondary | ICD-10-CM | POA: Diagnosis not present

## 2020-01-01 DIAGNOSIS — F0391 Unspecified dementia with behavioral disturbance: Secondary | ICD-10-CM | POA: Diagnosis not present

## 2020-01-01 DIAGNOSIS — G309 Alzheimer's disease, unspecified: Secondary | ICD-10-CM | POA: Diagnosis not present

## 2020-01-01 DIAGNOSIS — D62 Acute posthemorrhagic anemia: Secondary | ICD-10-CM | POA: Diagnosis not present

## 2020-01-01 DIAGNOSIS — F339 Major depressive disorder, recurrent, unspecified: Secondary | ICD-10-CM | POA: Diagnosis not present

## 2020-01-01 DIAGNOSIS — Z96651 Presence of right artificial knee joint: Secondary | ICD-10-CM | POA: Diagnosis present

## 2020-01-01 DIAGNOSIS — S82241A Displaced spiral fracture of shaft of right tibia, initial encounter for closed fracture: Secondary | ICD-10-CM | POA: Diagnosis not present

## 2020-01-01 DIAGNOSIS — N1831 Chronic kidney disease, stage 3a: Secondary | ICD-10-CM | POA: Diagnosis not present

## 2020-01-01 DIAGNOSIS — S82441A Displaced spiral fracture of shaft of right fibula, initial encounter for closed fracture: Secondary | ICD-10-CM | POA: Diagnosis not present

## 2020-01-01 DIAGNOSIS — I10 Essential (primary) hypertension: Secondary | ICD-10-CM | POA: Diagnosis not present

## 2020-01-01 DIAGNOSIS — F0150 Vascular dementia without behavioral disturbance: Secondary | ICD-10-CM | POA: Diagnosis not present

## 2020-01-01 DIAGNOSIS — M25572 Pain in left ankle and joints of left foot: Secondary | ICD-10-CM | POA: Diagnosis not present

## 2020-01-01 DIAGNOSIS — S82201A Unspecified fracture of shaft of right tibia, initial encounter for closed fracture: Secondary | ICD-10-CM

## 2020-01-01 DIAGNOSIS — W19XXXA Unspecified fall, initial encounter: Secondary | ICD-10-CM | POA: Diagnosis not present

## 2020-01-01 DIAGNOSIS — I129 Hypertensive chronic kidney disease with stage 1 through stage 4 chronic kidney disease, or unspecified chronic kidney disease: Secondary | ICD-10-CM | POA: Diagnosis not present

## 2020-01-01 DIAGNOSIS — Z66 Do not resuscitate: Secondary | ICD-10-CM | POA: Diagnosis not present

## 2020-01-01 DIAGNOSIS — S82202A Unspecified fracture of shaft of left tibia, initial encounter for closed fracture: Secondary | ICD-10-CM | POA: Diagnosis not present

## 2020-01-01 DIAGNOSIS — S82401A Unspecified fracture of shaft of right fibula, initial encounter for closed fracture: Secondary | ICD-10-CM | POA: Diagnosis not present

## 2020-01-01 DIAGNOSIS — S82301A Unspecified fracture of lower end of right tibia, initial encounter for closed fracture: Secondary | ICD-10-CM | POA: Diagnosis not present

## 2020-01-01 DIAGNOSIS — R0902 Hypoxemia: Secondary | ICD-10-CM | POA: Diagnosis not present

## 2020-01-01 DIAGNOSIS — Z79890 Hormone replacement therapy (postmenopausal): Secondary | ICD-10-CM | POA: Diagnosis not present

## 2020-01-01 DIAGNOSIS — S82209A Unspecified fracture of shaft of unspecified tibia, initial encounter for closed fracture: Secondary | ICD-10-CM | POA: Diagnosis present

## 2020-01-01 DIAGNOSIS — Z419 Encounter for procedure for purposes other than remedying health state, unspecified: Secondary | ICD-10-CM | POA: Diagnosis not present

## 2020-01-01 DIAGNOSIS — D500 Iron deficiency anemia secondary to blood loss (chronic): Secondary | ICD-10-CM | POA: Diagnosis not present

## 2020-01-01 DIAGNOSIS — Z20822 Contact with and (suspected) exposure to covid-19: Secondary | ICD-10-CM | POA: Diagnosis not present

## 2020-01-01 DIAGNOSIS — F29 Unspecified psychosis not due to a substance or known physiological condition: Secondary | ICD-10-CM | POA: Diagnosis not present

## 2020-01-01 DIAGNOSIS — Y92129 Unspecified place in nursing home as the place of occurrence of the external cause: Secondary | ICD-10-CM | POA: Diagnosis not present

## 2020-01-01 DIAGNOSIS — R410 Disorientation, unspecified: Secondary | ICD-10-CM | POA: Diagnosis not present

## 2020-01-01 DIAGNOSIS — S82242S Displaced spiral fracture of shaft of left tibia, sequela: Secondary | ICD-10-CM | POA: Diagnosis not present

## 2020-01-01 DIAGNOSIS — W1830XA Fall on same level, unspecified, initial encounter: Secondary | ICD-10-CM | POA: Diagnosis present

## 2020-01-01 DIAGNOSIS — Z79899 Other long term (current) drug therapy: Secondary | ICD-10-CM | POA: Diagnosis not present

## 2020-01-01 DIAGNOSIS — M2550 Pain in unspecified joint: Secondary | ICD-10-CM | POA: Diagnosis not present

## 2020-01-01 DIAGNOSIS — F0280 Dementia in other diseases classified elsewhere without behavioral disturbance: Secondary | ICD-10-CM | POA: Diagnosis not present

## 2020-01-01 DIAGNOSIS — Z90710 Acquired absence of both cervix and uterus: Secondary | ICD-10-CM | POA: Diagnosis not present

## 2020-01-01 DIAGNOSIS — E039 Hypothyroidism, unspecified: Secondary | ICD-10-CM | POA: Diagnosis not present

## 2020-01-01 HISTORY — PX: TIBIA IM NAIL INSERTION: SHX2516

## 2020-01-01 LAB — SURGICAL PCR SCREEN
MRSA, PCR: NEGATIVE
Staphylococcus aureus: NEGATIVE

## 2020-01-01 SURGERY — INSERTION, INTRAMEDULLARY ROD, TIBIA
Anesthesia: General | Site: Leg Lower | Laterality: Right

## 2020-01-01 MED ORDER — ONDANSETRON HCL 4 MG PO TABS: 4.0000 mg | ORAL_TABLET | Freq: Four times a day (QID) | ORAL | Status: DC | PRN

## 2020-01-01 MED ORDER — PROPOFOL 10 MG/ML IV BOLUS
INTRAVENOUS | Status: DC | PRN
  Administered 2020-01-01: 30 mg via INTRAVENOUS
  Administered 2020-01-01: 100 mg via INTRAVENOUS

## 2020-01-01 MED ORDER — ROCURONIUM BROMIDE 10 MG/ML (PF) SYRINGE
PREFILLED_SYRINGE | INTRAVENOUS | Status: AC
  Filled 2020-01-01: qty 10

## 2020-01-01 MED ORDER — LACTATED RINGERS IV SOLN: INTRAVENOUS | Status: DC

## 2020-01-01 MED ORDER — FENTANYL CITRATE (PF) 250 MCG/5ML IJ SOLN
INTRAMUSCULAR | Status: AC
  Filled 2020-01-01: qty 5

## 2020-01-01 MED ORDER — METHOCARBAMOL 1000 MG/10ML IJ SOLN
500.0000 mg | Freq: Four times a day (QID) | INTRAVENOUS | Status: DC
  Filled 2020-01-01 (×11): qty 5

## 2020-01-01 MED ORDER — TOBRAMYCIN SULFATE 1.2 G IJ SOLR
INTRAMUSCULAR | Status: AC
  Filled 2020-01-01: qty 1.2

## 2020-01-01 MED ORDER — MORPHINE SULFATE (PF) 2 MG/ML IV SOLN: 1.0000 mg | INTRAVENOUS | Status: DC | PRN

## 2020-01-01 MED ORDER — FENTANYL CITRATE (PF) 250 MCG/5ML IJ SOLN
INTRAMUSCULAR | Status: DC | PRN
  Administered 2020-01-01: 25 ug via INTRAVENOUS
  Administered 2020-01-01: 50 ug via INTRAVENOUS
  Administered 2020-01-01: 25 ug via INTRAVENOUS

## 2020-01-01 MED ORDER — CHLORHEXIDINE GLUCONATE 4 % EX LIQD: 60.0000 mL | Freq: Once | CUTANEOUS | Status: DC

## 2020-01-01 MED ORDER — DEXAMETHASONE SODIUM PHOSPHATE 10 MG/ML IJ SOLN
INTRAMUSCULAR | Status: DC | PRN
  Administered 2020-01-01: 5 mg via INTRAVENOUS

## 2020-01-01 MED ORDER — METOCLOPRAMIDE HCL 5 MG/ML IJ SOLN: 5.0000 mg | Freq: Three times a day (TID) | INTRAMUSCULAR | Status: DC | PRN

## 2020-01-01 MED ORDER — VANCOMYCIN HCL 1000 MG IV SOLR
INTRAVENOUS | Status: AC
  Filled 2020-01-01: qty 1000

## 2020-01-01 MED ORDER — CHLORHEXIDINE GLUCONATE 0.12 % MT SOLN
15.0000 mL | Freq: Once | OROMUCOSAL | Status: AC
  Administered 2020-01-01: 15 mL via OROMUCOSAL
  Filled 2020-01-01: qty 15

## 2020-01-01 MED ORDER — TRAMADOL HCL 50 MG PO TABS
50.0000 mg | ORAL_TABLET | Freq: Four times a day (QID) | ORAL | Status: DC | PRN
  Administered 2020-01-02 – 2020-01-03 (×2): 50 mg via ORAL
  Filled 2020-01-01 (×2): qty 1

## 2020-01-01 MED ORDER — SUGAMMADEX SODIUM 200 MG/2ML IV SOLN
INTRAVENOUS | Status: DC | PRN
  Administered 2020-01-01: 200 mg via INTRAVENOUS

## 2020-01-01 MED ORDER — EPHEDRINE SULFATE-NACL 50-0.9 MG/10ML-% IV SOSY
PREFILLED_SYRINGE | INTRAVENOUS | Status: DC | PRN
  Administered 2020-01-01 (×2): 10 mg via INTRAVENOUS

## 2020-01-01 MED ORDER — 0.9 % SODIUM CHLORIDE (POUR BTL) OPTIME
TOPICAL | Status: DC | PRN
  Administered 2020-01-01: 1000 mL

## 2020-01-01 MED ORDER — DEXAMETHASONE SODIUM PHOSPHATE 10 MG/ML IJ SOLN
INTRAMUSCULAR | Status: AC
  Filled 2020-01-01: qty 1

## 2020-01-01 MED ORDER — METHOCARBAMOL 500 MG PO TABS
500.0000 mg | ORAL_TABLET | Freq: Four times a day (QID) | ORAL | Status: DC
  Administered 2020-01-01 – 2020-01-03 (×6): 500 mg via ORAL
  Filled 2020-01-01 (×6): qty 1

## 2020-01-01 MED ORDER — ROCURONIUM BROMIDE 10 MG/ML (PF) SYRINGE
PREFILLED_SYRINGE | INTRAVENOUS | Status: DC | PRN
  Administered 2020-01-01: 50 mg via INTRAVENOUS

## 2020-01-01 MED ORDER — FENTANYL CITRATE (PF) 100 MCG/2ML IJ SOLN: 25.0000 ug | INTRAMUSCULAR | Status: DC | PRN

## 2020-01-01 MED ORDER — LACTATED RINGERS IV SOLN: INTRAVENOUS | Status: DC | PRN

## 2020-01-01 MED ORDER — PHENYLEPHRINE HCL-NACL 10-0.9 MG/250ML-% IV SOLN
INTRAVENOUS | Status: DC | PRN
  Administered 2020-01-01: 25 ug/min via INTRAVENOUS

## 2020-01-01 MED ORDER — VANCOMYCIN HCL 1000 MG IV SOLR
INTRAVENOUS | Status: DC | PRN
  Administered 2020-01-01: 1000 mg

## 2020-01-01 MED ORDER — ENOXAPARIN SODIUM 40 MG/0.4ML ~~LOC~~ SOLN
40.0000 mg | SUBCUTANEOUS | Status: DC
  Administered 2020-01-02 – 2020-01-03 (×2): 40 mg via SUBCUTANEOUS
  Filled 2020-01-01 (×2): qty 0.4

## 2020-01-01 MED ORDER — ORAL CARE MOUTH RINSE: 15.0000 mL | Freq: Once | OROMUCOSAL | Status: AC

## 2020-01-01 MED ORDER — ONDANSETRON HCL 4 MG/2ML IJ SOLN: 4.0000 mg | Freq: Four times a day (QID) | INTRAMUSCULAR | Status: DC | PRN

## 2020-01-01 MED ORDER — POVIDONE-IODINE 10 % EX SWAB: 2.0000 "application " | Freq: Once | CUTANEOUS | Status: DC

## 2020-01-01 MED ORDER — ONDANSETRON HCL 4 MG/2ML IJ SOLN
INTRAMUSCULAR | Status: DC | PRN
  Administered 2020-01-01: 4 mg via INTRAVENOUS

## 2020-01-01 MED ORDER — ONDANSETRON HCL 4 MG/2ML IJ SOLN
INTRAMUSCULAR | Status: AC
  Filled 2020-01-01: qty 2

## 2020-01-01 MED ORDER — SUCCINYLCHOLINE CHLORIDE 200 MG/10ML IV SOSY
PREFILLED_SYRINGE | INTRAVENOUS | Status: AC
  Filled 2020-01-01: qty 10

## 2020-01-01 MED ORDER — CEFAZOLIN SODIUM-DEXTROSE 2-4 GM/100ML-% IV SOLN
2.0000 g | INTRAVENOUS | Status: AC
  Administered 2020-01-01: 2 g via INTRAVENOUS
  Filled 2020-01-01: qty 100

## 2020-01-01 MED ORDER — LIDOCAINE 2% (20 MG/ML) 5 ML SYRINGE
INTRAMUSCULAR | Status: DC | PRN
  Administered 2020-01-01: 20 mg via INTRAVENOUS

## 2020-01-01 MED ORDER — LIDOCAINE 2% (20 MG/ML) 5 ML SYRINGE
INTRAMUSCULAR | Status: AC
  Filled 2020-01-01: qty 5

## 2020-01-01 MED ORDER — CEFAZOLIN SODIUM-DEXTROSE 2-4 GM/100ML-% IV SOLN
2.0000 g | Freq: Three times a day (TID) | INTRAVENOUS | Status: AC
  Administered 2020-01-01 – 2020-01-02 (×2): 2 g via INTRAVENOUS
  Filled 2020-01-01 (×2): qty 100

## 2020-01-01 MED ORDER — METOCLOPRAMIDE HCL 5 MG PO TABS: 5.0000 mg | ORAL_TABLET | Freq: Three times a day (TID) | ORAL | Status: DC | PRN

## 2020-01-01 SURGICAL SUPPLY — 54 items
BIT DRILL CALIBRATED 4.3X320MM (BIT) ×1 IMPLANT
BIT DRILL CROWE POINT TWST 4.3 (DRILL) ×2 IMPLANT
BLADE SURG 10 STRL SS (BLADE) ×4 IMPLANT
BNDG COHESIVE 4X5 TAN STRL (GAUZE/BANDAGES/DRESSINGS) ×2 IMPLANT
BNDG ELASTIC 4X5.8 VLCR STR LF (GAUZE/BANDAGES/DRESSINGS) ×2 IMPLANT
BNDG ELASTIC 6X5.8 VLCR STR LF (GAUZE/BANDAGES/DRESSINGS) ×2 IMPLANT
BNDG GAUZE ELAST 4 BULKY (GAUZE/BANDAGES/DRESSINGS) ×2 IMPLANT
BRUSH SCRUB EZ PLAIN DRY (MISCELLANEOUS) ×4 IMPLANT
CHLORAPREP W/TINT 26 (MISCELLANEOUS) ×2 IMPLANT
COVER SURGICAL LIGHT HANDLE (MISCELLANEOUS) ×4 IMPLANT
DERMABOND ADVANCED (GAUZE/BANDAGES/DRESSINGS) ×1
DERMABOND ADVANCED .7 DNX12 (GAUZE/BANDAGES/DRESSINGS) ×1 IMPLANT
DRAPE C-ARM 42X72 X-RAY (DRAPES) ×2 IMPLANT
DRAPE HALF SHEET 40X57 (DRAPES) ×4 IMPLANT
DRAPE IMP U-DRAPE 54X76 (DRAPES) ×4 IMPLANT
DRAPE INCISE IOBAN 66X45 STRL (DRAPES) IMPLANT
DRAPE ORTHO SPLIT 77X108 STRL (DRAPES) ×2
DRAPE SURG ORHT 6 SPLT 77X108 (DRAPES) ×2 IMPLANT
DRAPE U-SHAPE 47X51 STRL (DRAPES) ×2 IMPLANT
DRILL CALIBRATED 4.3X320MM (BIT) ×2
DRILL CROWE POINT TWIST 4.3 (DRILL) ×4
DRSG ADAPTIC 3X8 NADH LF (GAUZE/BANDAGES/DRESSINGS) ×2 IMPLANT
ELECT REM PT RETURN 9FT ADLT (ELECTROSURGICAL) ×2
ELECTRODE REM PT RTRN 9FT ADLT (ELECTROSURGICAL) ×1 IMPLANT
GAUZE SPONGE 4X4 12PLY STRL (GAUZE/BANDAGES/DRESSINGS) ×2 IMPLANT
GLOVE BIO SURGEON STRL SZ 6.5 (GLOVE) ×6 IMPLANT
GLOVE BIO SURGEON STRL SZ7.5 (GLOVE) ×8 IMPLANT
GLOVE BIOGEL PI IND STRL 6.5 (GLOVE) ×1 IMPLANT
GLOVE BIOGEL PI IND STRL 7.5 (GLOVE) ×1 IMPLANT
GLOVE BIOGEL PI INDICATOR 6.5 (GLOVE) ×1
GLOVE BIOGEL PI INDICATOR 7.5 (GLOVE) ×1
GOWN STRL REUS W/ TWL LRG LVL3 (GOWN DISPOSABLE) ×2 IMPLANT
GOWN STRL REUS W/TWL LRG LVL3 (GOWN DISPOSABLE) ×2
GUIDEPIN 3.2X17.5 THRD DISP (PIN) ×2 IMPLANT
GUIDEWIRE 2.6X80 BEAD TIP (WIRE) ×1 IMPLANT
GUIDWIRE 2.6X80 BEAD TIP (WIRE) ×2
KIT BASIN OR (CUSTOM PROCEDURE TRAY) ×2 IMPLANT
KIT TURNOVER KIT B (KITS) ×2 IMPLANT
NAIL TIB IM PHOENIX 9X290 (Nail) ×2 IMPLANT
PACK TOTAL JOINT (CUSTOM PROCEDURE TRAY) ×2 IMPLANT
PAD ARMBOARD 7.5X6 YLW CONV (MISCELLANEOUS) ×4 IMPLANT
PAD CAST 4YDX4 CTTN HI CHSV (CAST SUPPLIES) ×2 IMPLANT
PADDING CAST COTTON 4X4 STRL (CAST SUPPLIES) ×2
PADDING CAST COTTON 6X4 STRL (CAST SUPPLIES) ×4 IMPLANT
SCREW CORT TI DBL LEAD 5X24 (Screw) ×4 IMPLANT
SCREW CORT TI DBL LEAD 5X32 (Screw) ×4 IMPLANT
SCREW CORT TI DBL LEAD 5X36 (Screw) ×2 IMPLANT
SCREW CORT TI DBL LEAD 5X48 (Screw) ×2 IMPLANT
SUT MNCRL AB 3-0 PS2 27 (SUTURE) ×4 IMPLANT
SUT VIC AB 2-0 CT1 27 (SUTURE) ×2
SUT VIC AB 2-0 CT1 TAPERPNT 27 (SUTURE) ×2 IMPLANT
TOWEL GREEN STERILE (TOWEL DISPOSABLE) ×4 IMPLANT
TOWEL GREEN STERILE FF (TOWEL DISPOSABLE) ×2 IMPLANT
YANKAUER SUCT BULB TIP NO VENT (SUCTIONS) IMPLANT

## 2020-01-01 NOTE — Progress Notes (Signed)
Pt's watch and ring placed in pink denture cup with pt sticker on it for surgery.

## 2020-01-01 NOTE — Progress Notes (Addendum)
Pt returned from surgery and 1:1 sitter unable to come back due to being put with another sitter case. RN and NT doing hourly rounding on pt. When NT was going to do her round pt was turned sideways in bed, had pulled her foley out with the balloon still inflated, had pulled her IV out, and taken the straps off the [place]. Pt was put back in bed, boot reapplied. No obvious signs of trauma or bleeding from foley being pulled out. Pt denies any pain. MD made aware. Order for telesitter placed.   Called for telesitter but there is no available telesitter at [facility]. Will continue to do hourly rounding on pt. Door open, bed alarm on, call bell within reach.

## 2020-01-01 NOTE — Consult Note (Signed)
Reason for Consult:Right tib/fib fxs Referring Physician: Juantia Cohen is an 86 y.o. female.  HPI: Haley Cohen fell [facility] the [facility]. Afterwards she c/o severe right leg pain and could not stand or bear weight. She was brought to the ED where x-rays showed a tib/fib fx and orthopedic surgery was consulted. After review of the fracture and the chart orthopedic trauma consultation was requested. She has fairly advanced dementia and cannot contribute meaningfully to history.  Past Medical History:  Diagnosis Date  . Alzheimer disease ([place]) 10/27/2016  . Arthritis   . Confusion 04/04/2019  . Depression, recurrent ([place]) 10/27/2016  . History of fracture of right hip 04/04/2019   07/01/19 Ortho, R hip incision healed, X-ray looks good, s/p IMN R hip fx, WBAT, f/u 6 mos.   Marland Kitchen HTN (hypertension) 10/27/2016  . Hypertension   . Hypertensive kidney disease with CKD (chronic kidney disease) stage V ([place]) 11/02/2016   11/02/16 Na 136, K 4.7, Bun 30, creat 1.52, TSH 0.86, wbc 6.7, Hgb 11.3, plt 264  . Hypothyroidism 10/27/2016  . Osteoarthritis 10/27/2016  . Thyroid disease     Past Surgical History:  Procedure Laterality Date  . ABDOMINAL HYSTERECTOMY  1986  . INTRAMEDULLARY (IM) NAIL INTERTROCHANTERIC Right 04/04/2019   Procedure: INTRAMEDULLARY (IM) NAIL INTERTROCHANTRIC;  Surgeon: Rod Can, MD;  Location: [facility];  Service: Orthopedics;  Laterality: Right;  . KNEE ARTHROSCOPY  2015/2017   Dr. Gustavus Bryant  . SPINE SURGERY  2009, 2011   Dr.Mark Nicoletta Dress    Family History  Problem Relation Age of Onset  . Cancer Mother     Social History:  reports that she has never smoked. She has never used smokeless tobacco. She reports previous alcohol use. She reports that she does not use drugs.  Allergies: No Known Allergies  Medications: I have reviewed the patient's current medications.  Results for orders placed or performed during the hospital encounter of 12/31/19 (from the  past 48 hour(s))  SARS Coronavirus 2 by RT PCR (hospital order, performed in [facility] hospital lab) Nasopharyngeal Nasopharyngeal Swab     Status: None   Collection Time: 12/31/19  6:52 PM   Specimen: Nasopharyngeal Swab  Result Value Ref Range   SARS Coronavirus 2 NEGATIVE NEGATIVE    Comment: (NOTE) SARS-CoV-2 target nucleic acids are NOT DETECTED. The SARS-CoV-2 RNA is generally detectable in upper and lower respiratory specimens during the acute phase of infection. The lowest concentration of SARS-CoV-2 viral copies this assay can detect is 250 copies / mL. A negative result does not preclude SARS-CoV-2 infection and should not be used as the sole basis for treatment or other patient management decisions.  A negative result may occur with improper specimen collection / handling, submission of specimen other than nasopharyngeal swab, presence of viral mutation(s) within the areas targeted by this assay, and inadequate number of viral copies (<250 copies / mL). A negative result must be combined with clinical observations, patient history, and epidemiological information. Fact Sheet for Patients:   StrictlyIdeas.no Fact Sheet for Healthcare Providers: BankingDealers.co.za This test is not yet approved or cleared  by the [place] FDA and has been authorized for detection and/or diagnosis of SARS-CoV-2 by FDA under an Emergency Use Authorization (EUA).  This EUA will remain in effect (meaning this test can be used) for the duration of the COVID-19 declaration under Section 564(b)(1) of the Act, 21 U.S.C. section 360bbb-3(b)(1), unless the authorization is terminated or revoked sooner. Performed at [facility], [place] [address] [place], [place] [address]   Type and screen [place]     Status: None   Collection Time: 12/31/19  6:55 PM  Result Value Ref Range   ABO/RH(D) O NEG    Antibody Screen NEG     Sample Expiration      01/03/2020,2359 Performed at [facility], [place] [address] [place], [place] [address]   ABO/Rh     Status: None   Collection Time: 12/31/19  6:55 PM  Result Value Ref Range   ABO/RH(D)      O NEG Performed at [facility] [address] [place], [place] [address]   CBC     Status: None   Collection Time: 12/31/19  7:00 PM  Result Value Ref Range   WBC 10.1 4.0 - 10.5 K/uL   RBC 4.17 3.87 - 5.11 MIL/uL   Hemoglobin 12.5 12.0 - 15.0 g/dL   HCT 40.1 36.0 - 46.0 %   MCV 96.2 80.0 - 100.0 fL   MCH 30.0 26.0 - 34.0 pg   MCHC 31.2 30.0 - 36.0 g/dL   RDW 13.8 11.5 - 15.5 %   Platelets 217 150 - 400 K/uL   nRBC 0.0 0.0 - 0.2 %    Comment: Performed at [facility], [place] [address], [place], [place] [id number]  Basic metabolic panel     Status: Abnormal   Collection Time: 12/31/19  7:00 PM  Result Value Ref Range   Sodium 140 135 - 145 mmol/L   Potassium 4.5 3.5 - 5.1 mmol/L   Chloride 104 98 - 111 mmol/L   CO2 24 22 - 32 mmol/L   Glucose, Bld 112 (H) 70 - 99 mg/dL    Comment: Glucose reference range applies only to samples taken after fasting for at least 8 hours.   BUN 15 8 - 23 mg/dL   Creatinine, Ser 0.94 0.44 - 1.00 mg/dL   Calcium 10.4 (H) 8.9 - 10.3 mg/dL   GFR calc non Af Amer 55 (L) >60 mL/min   GFR calc Af Amer >60 >60 mL/min   Anion gap 12 5 - 15    Comment: Performed at [facility] [address], [place], [place] [address]  Protime-INR     Status: None   Collection Time: 12/31/19  7:00 PM  Result Value Ref Range   Prothrombin Time 13.1 11.4 - 15.2 seconds   INR 1.0 0.8 - 1.2    Comment: (NOTE) INR goal varies based on device and disease states. Performed at [facility], [place] [address] [place], [place] [address]   Surgical pcr screen     Status: None   Collection Time: 01/01/20 12:32 AM   Specimen: Nasal Mucosa; Nasal Swab  Result Value Ref Range   MRSA, PCR NEGATIVE NEGATIVE   Staphylococcus aureus  NEGATIVE NEGATIVE    Comment: (NOTE) The Xpert SA Assay (FDA approved for NASAL specimens in patients 9 years of age and older), is one component of a comprehensive surveillance program. It is not intended to diagnose infection nor to guide or monitor treatment. Performed at [facility], [place] [address], [place], [place] [address]     DG Ankle Complete Right  Result Date: 12/31/2019 CLINICAL DATA:  [place].  Tib fib deformity. EXAM: RIGHT ANKLE - COMPLETE 3+ VIEW COMPARISON:  None. FINDINGS: Displaced spiral type fracture of the mid distal tibial shaft and the mid  fibular shaft. There is 1 shaft width of lateral and anterior displacement of the tibial fracture with a moderate-sized butterfly fragment. The ankle and hindfoot bony structures are intact. IMPRESSION: Displaced spiral type fractures of the mid distal tibial shaft and the mid fibula. Electronically Signed   By: Marijo Sanes M.D.   On: 12/31/2019 18:51   DG Knee Complete 4 Views Right  Result Date: 12/31/2019 CLINICAL DATA:  Knee pain after fall EXAM: RIGHT KNEE - COMPLETE 4+ VIEW COMPARISON:  None. FINDINGS: Normal appearance of right total knee arthroplasty. There is a laterally displaced oblique fracture of the midshaft of the right fibula. No knee effusion. IMPRESSION: Laterally displaced oblique fracture of the midshaft of the right fibula. Right total knee arthroplasty without adverse features. Electronically Signed   By: Ulyses Jarred M.D.   On: 12/31/2019 19:39   DG Toe Great Right  Result Date: 12/31/2019 CLINICAL DATA:  Fall with toe pain EXAM: RIGHT GREAT TOE COMPARISON:  None. FINDINGS: There is no evidence of fracture or dislocation. There is no evidence of arthropathy or other focal bone abnormality. Soft tissues are unremarkable. IMPRESSION: Negative. Electronically Signed   By: Ulyses Jarred M.D.   On: 12/31/2019 19:38    Review of Systems  Unable to perform ROS: Dementia   Blood pressure 140/65,  pulse 79, temperature 99.8 F (37.7 C), temperature source Oral, resp. rate 14, SpO2 98 %. Physical Exam  Constitutional: She appears well-developed and well-nourished. No distress.  HENT:  Head: Normocephalic and atraumatic.  Eyes: Conjunctivae are normal. Right eye exhibits no discharge. Left eye exhibits no discharge. No scleral icterus.  Cardiovascular: Normal rate and regular rhythm.  Respiratory: Effort normal. No respiratory distress.  Musculoskeletal:     Cervical back: Normal range of motion.     Comments: LLE No traumatic wounds, ecchymosis, or rash  Short leg splint in place  No knee effusion  Knee stable to varus/ valgus and anterior/posterior stress  Sens DPN, SPN, TN grossly intact  Motor EHL, ext, flex, evers grossly intact  Toes warm, No significant edema  Neurological: She is alert.  Skin: Skin is warm and dry. She is not diaphoretic.  Psychiatric: She has a normal mood and affect. Her behavior is normal.    Assessment/Plan: Right tib/fib fx -- Plan IMN today by Dr. Doreatha Martin. Please keep NPO. Multiple medical problems including Alzheimer's dementia, hypertension, hypothyroidism, osteoarthritis, and depression -- per primary service     Lisette Abu, PA-C Orthopedic Surgery [phone] 01/01/2020, 8:49 AM

## 2020-01-01 NOTE — Op Note (Signed)
Orthopaedic Surgery Operative Note (CSN: [id number] ) Date of Surgery: 01/01/2020  Admit Date: 12/31/2019   Diagnoses: Pre-Op Diagnoses: Right periprosthetic tibial shaft fracture  Post-Op Diagnosis: Same  Procedures: CPT 27759-Intramedullary nailing of right tibial shaft fracture  Surgeons : Primary: Shona Needles, MD  Assistant: Patrecia Pace, PA-C  Location: OR 6   Anesthesia:General  Antibiotics: Ancef 2g preop with 1 gm vancomycin powder placed topically   Tourniquet time:None  Estimated Blood AB-123456789 mL  Complications:None  Specimens:None   Implants: Implant Name Type Inv. Item Serial No. Manufacturer Lot No. LRB No. Used Action  phoenix tibial nail    [employer] [id number] Right 1 Implanted  SCREW CORT TI DBL LEAD 5X36 - [id number] Screw SCREW CORT TI DBL LEAD 5X36  [employer](ORTH,TRAU,BIO,SG) [id number] Right 1 Implanted  SCREW CORT TI DBL LEAD 5X32 - [id number] Screw SCREW CORT TI DBL LEAD 5X32  [employer](ORTH,TRAU,BIO,SG) [id number] Right 1 Implanted  SCREW CORT TI DBL LEAD 5X32 - [id number] Screw SCREW CORT TI DBL LEAD 5X32  [employer](ORTH,TRAU,BIO,SG) [id number] Right 1 Implanted  SCREW CORT TI DBL LEAD 5X48 - [id number] Screw SCREW CORT TI DBL LEAD 5X48  [employer](ORTH,TRAU,BIO,SG) [id number] Right 1 Implanted  SCREW CORT TI DBL LEAD 5X24 - [id number] Screw SCREW CORT TI DBL LEAD 5X24  [employer](ORTH,TRAU,BIO,SG) [id number] Right 1 Implanted  SCREW CORT TI DBL LEAD 5X24 - [id number] Screw SCREW CORT TI DBL LEAD 5X24  [employer](ORTH,TRAU,BIO,SG) [id number] Right 1 Implanted     Indications for Surgery: 86 year old female who had a ground-level fall and sustained a right tibial shaft fracture.  She had previous history of the total knee arthroplasty.  She has a history of dementia however she was ambulatory and I recommended proceeding with intramedullary nailing of her right tibia.  Risks and benefits were discussed with the patient's son.  Risks include but not  limited to bleeding, infection, malunion, nonunion, hardware failure, hardware irritation, nerve and blood vessel injury, DVT, even possibility anesthetic complications.  He agreed to proceed with surgery and consent was obtained.  Operative Findings: Intramedullary nailing of right tibial shaft fracture using [employer] [place] nail 9 x 290 mm  Procedure: The patient was identified in the preoperative holding area. Consent was confirmed with the patient and their family and all questions were answered. The operative extremity was marked after confirmation with the patient. she was then brought back to the operating room by our anesthesia colleagues.  She was placed under general anesthetic and carefully transferred over to a radiolucent flat top table.  The right lower extremity was prepped and draped in usual sterile fashion.  Timeout was performed to verify the patient, the procedure, and extremity.  Preoperative antibiotics were dosed.  Fluoroscopic imaging was obtained to show the unstable nature of her injury.  The patient did have a total knee arthroplasty in place however she had a large fracture blister over her fracture site that precluded a formal open reduction internal fixation.  As result I felt that intramedullary nailing around her prosthesis would be most appropriate.  A lateral parapatellar incision was carried down through skin and subcutaneous tissue.  The lateral retinaculum was exposed to mobilize the patella medially.  I mobilized the scar tissue underneath the patellar tendon to be able access appropriate starting point anterior to the arthroplasty.  Using a threaded guidewire I carefully directed it down just anterior to the prosthesis.  I took care to protect the tubercle during insertion and placement of the  reamers and nail.  I then used an awl to enter the metaphysis.  I then passed a ball-tipped guidewire down the center of the canal.  I seated it in the distal metaphyseal  segment.  I then measured the length and chose a 290 mm nail.  I then sequentially reamed from 41mm to 10.5 mm in the tibial shaft.  I did ream up to 11.5 mm in the proximal segment to accommodate the proximal nail.  I then passed the nail down the center of the canal reduction of the fracture was obtained.  Perfect circle technique was used to place a medial to lateral interlock screws distally as well as an anterior to posterior screw.  The targeting arm was used to place 2 proximal interlocking screws.  Final fluoroscopic imaging was obtained.  The incision was copiously irrigated.  A gram of vancomycin powder was placed into the incision.  A layer closure consisting of 0 Vicryl, 2-0 Vicryl and 3-0 Monocryl was used.  Sterile dressing was placed.  She was placed in a Ace wrap and sterile cast padding.  A boot was placed to her lower extremity.  The patient was awoken from anesthesia taken to the PACU stable condition.  Post Op Plan/Instructions: The patient will be allowed to be weightbearing as tolerated to the right lower extremity.  She will receive postoperative Ancef.  I will recommend Lovenox for DVT prophylaxis while inpatient and discharged home on aspirin.  She will mobilize with physical therapy.  I was present and performed the entire surgery.  Patrecia Pace, PA-C did assist me throughout the case. An assistant was necessary given the difficulty in approach, maintenance of reduction and ability to instrument the fracture.   Katha Hamming, MD [facility]

## 2020-01-01 NOTE — Anesthesia Procedure Notes (Signed)
Procedure Name: Intubation Date/Time: 01/01/2020 11:58 AM Performed by: Kathryne Hitch, CRNA Pre-anesthesia Checklist: Patient identified, Emergency Drugs available, Suction available and Patient being monitored Patient Re-evaluated:Patient Re-evaluated prior to induction Oxygen Delivery Method: Circle system utilized Preoxygenation: Pre-oxygenation with 100% oxygen Induction Type: IV induction Ventilation: Mask ventilation without difficulty Laryngoscope Size: Miller and 2 Grade View: Grade I Tube type: Oral Tube size: 7.0 mm Number of attempts: 1 Airway Equipment and Method: Stylet and Oral airway Placement Confirmation: ETT inserted through vocal cords under direct vision,  positive ETCO2 and breath sounds checked- equal and bilateral Secured at: 21 cm Tube secured with: Tape Dental Injury: Teeth and Oropharynx as per pre-operative assessment

## 2020-01-01 NOTE — Transfer of Care (Signed)
Immediate Anesthesia Transfer of Care Note  Patient: Haley Cohen  Procedure(s) Performed: INTRAMEDULLARY (IM) NAIL TIBIAL (Right Leg Lower)  Patient Location: PACU  Anesthesia Type:General  Level of Consciousness: drowsy and patient cooperative  Airway & Oxygen Therapy: Patient Spontanous Breathing and Patient connected to face mask oxygen  Post-op Assessment: Report given to RN and Post -op Vital signs reviewed and stable  Post vital signs: Reviewed and stable  Last Vitals:  Vitals Value Taken Time  BP 108/49 01/01/20 1339  Temp    Pulse 71 01/01/20 1340  Resp 13 01/01/20 1340  SpO2 98 % 01/01/20 1340  Vitals shown include unvalidated device data.  Last Pain:  Vitals:   01/01/20 0822  TempSrc: Oral  PainSc:          Complications: No apparent anesthesia complications

## 2020-01-01 NOTE — Progress Notes (Signed)
PROGRESS NOTE  Haley Cohen [id number] DOB: 21-Oct-1931 DOA: 12/31/2019 PCP: [name], MD   LOS: 0 days   Brief Narrative / Interim history: 86 year old female with Alzheimer's dementia, hypertension, hypothyroidism, came into the hospital with mechanical fall and right lower extremity pain.  She had difficulties ambulating.  Patient is unable to provide further details concerning this fall given underlying dementia.  Imaging in the ED showed midshaft fibular fracture as well as right mid distal tibial shaft fracture.  Orthopedic surgery consulted and recommended operative repair.  Subjective / 24h Interval events: Underlying dementia, no specific complaints for me this morning.  Denies pain.  No shortness of breath.  Assessment & Plan: Principal Problem Fall with fibula/tibial fractures-appreciate orthopedic surgery consultation, she will be taken to the operative repair today.  Active Problems Essential hypertension-continue home antihypertensives  Hypothyroidism-continue home Synthroid  Dementia-continue home regimen with Depakote, Cymbalta, Namenda, Seroquel, trazodone  Chronic kidney disease, stage IIIa -currently at baseline of around 1.0   Scheduled Meds: . chlorhexidine  60 mL Topical Once  . [MAR Hold] divalproex  250 mg Oral BID  . [MAR Hold] docusate sodium  100 mg Oral BID  . [MAR Hold] DULoxetine  30 mg Oral Daily  . [MAR Hold] levothyroxine  25 mcg Oral QAC breakfast  . [MAR Hold] lisinopril  10 mg Oral Daily  . [MAR Hold] LORazepam  1 mg Oral QHS  . [MAR Hold] memantine  10 mg Oral BID  . povidone-iodine  2 application Topical Once  . [MAR Hold] QUEtiapine  25 mg Oral QHS  . [MAR Hold] traZODone  50 mg Oral QHS   Continuous Infusions: .  ceFAZolin (ANCEF) IV    . lactated ringers 10 mL/hr at 01/01/20 0950   PRN Meds:.[MAR Hold] acetaminophen **OR** [MAR Hold] acetaminophen, [MAR Hold] enalaprilat, [MAR Hold]  morphine injection, [MAR Hold]  ondansetron **OR** [MAR Hold] ondansetron (ZOFRAN) IV, [MAR Hold] polyethylene glycol, [MAR Hold] traMADol  DVT prophylaxis: SCDs Code Status: DNR Family Communication: no family at bedside  Status is: Observation  The patient will require care spanning > 2 midnights and should be moved to inpatient because: Undergoing operative repair today  Dispo: The patient is from: Home              Anticipated d/c is to: SNF              Anticipated d/c date is: 2 days              Patient currently is not medically stable to d/c.  Consultants:  Orthopedic surgery  Procedures:  None   Microbiology  None   Antimicrobials: None     Objective: Vitals:   12/31/19 2341 01/01/20 0011 01/01/20 0822 01/01/20 0927  BP: (!) 168/84 (!) 187/88 140/65   Pulse:  85 79   Resp:  16 14   Temp:  98.5 F (36.9 C) 99.8 F (37.7 C)   TempSrc:  Oral Oral   SpO2:  99% 98%   Weight:    69.9 kg  Height:    4\' 11"  (1.499 m)    Intake/Output Summary (Last 24 hours) at 01/01/2020 1041 Last data filed at 01/01/2020 [id number] Gross per 24 hour  Intake 34.82 ml  Output 400 ml  Net -365.18 ml   Filed Weights   01/01/20 0927  Weight: 69.9 kg    Examination:  Constitutional: NAD Eyes: no scleral icterus ENMT: Mucous membranes are moist.  Neck: normal, supple Respiratory: clear to  auscultation bilaterally, no wheezing, no crackles.  Cardiovascular: Regular rate and rhythm, no murmurs / rubs / gallops. No LE edema.  Abdomen: non distended, no tenderness. Bowel sounds positive.  Musculoskeletal: no clubbing / cyanosis.  Skin: no rashes Neurologic: non focal. LE deferred  Data Reviewed: I have independently reviewed following labs and imaging studies   CBC: Recent Labs  Lab 12/31/19 1900  WBC 10.1  HGB 12.5  HCT 40.1  MCV 96.2  PLT [id number]   Basic Metabolic Panel: Recent Labs  Lab 12/31/19 1900  NA 140  K 4.5  CL 104  CO2 24  GLUCOSE 112*  BUN 15  CREATININE 0.94  CALCIUM 10.4*    Liver Function Tests: No results for input(s): AST, ALT, ALKPHOS, BILITOT, PROT, ALBUMIN in the last 168 hours. Coagulation Profile: Recent Labs  Lab 12/31/19 1900  INR 1.0   HbA1C: No results for input(s): HGBA1C in the last 72 hours. CBG: No results for input(s): GLUCAP in the last 168 hours.  Recent Results (from the past 240 hour(s))  SARS Coronavirus 2 by RT PCR (hospital order, performed in [facility] hospital lab) Nasopharyngeal Nasopharyngeal Swab     Status: None   Collection Time: 12/31/19  6:52 PM   Specimen: Nasopharyngeal Swab  Result Value Ref Range Status   SARS Coronavirus 2 NEGATIVE NEGATIVE Final    Comment: (NOTE) SARS-CoV-2 target nucleic acids are NOT DETECTED. The SARS-CoV-2 RNA is generally detectable in upper and lower respiratory specimens during the acute phase of infection. The lowest concentration of SARS-CoV-2 viral copies this assay can detect is 250 copies / mL. A negative result does not preclude SARS-CoV-2 infection and should not be used as the sole basis for treatment or other patient management decisions.  A negative result may occur with improper specimen collection / handling, submission of specimen other than nasopharyngeal swab, presence of viral mutation(s) within the areas targeted by this assay, and inadequate number of viral copies (<250 copies / mL). A negative result must be combined with clinical observations, patient history, and epidemiological information. Fact Sheet for Patients:   StrictlyIdeas.no Fact Sheet for Healthcare Providers: BankingDealers.co.za This test is not yet approved or cleared  by the [place] FDA and has been authorized for detection and/or diagnosis of SARS-CoV-2 by FDA under an Emergency Use Authorization (EUA).  This EUA will remain in effect (meaning this test can be used) for the duration of the COVID-19 declaration under Section 564(b)(1) of the  Act, 21 U.S.C. section 360bbb-3(b)(1), unless the authorization is terminated or revoked sooner. Performed at [facility], [place] [address] [place], [place] [address]   Surgical pcr screen     Status: None   Collection Time: 01/01/20 12:32 AM   Specimen: Nasal Mucosa; Nasal Swab  Result Value Ref Range Status   MRSA, PCR NEGATIVE NEGATIVE Final   Staphylococcus aureus NEGATIVE NEGATIVE Final    Comment: (NOTE) The Xpert SA Assay (FDA approved for NASAL specimens in patients 72 years of age and older), is one component of a comprehensive surveillance program. It is not intended to diagnose infection nor to guide or monitor treatment. Performed at [facility], [place] [address] [place], [place] [address]      Radiology Studies: DG Ankle Complete Right  Result Date: 12/31/2019 CLINICAL DATA:  [place].  Tib fib deformity. EXAM: RIGHT ANKLE - COMPLETE 3+ VIEW COMPARISON:  None. FINDINGS: Displaced spiral type fracture of the mid distal tibial shaft and the mid  fibular shaft. There is 1 shaft width of lateral and anterior displacement of the tibial fracture with a moderate-sized butterfly fragment. The ankle and hindfoot bony structures are intact. IMPRESSION: Displaced spiral type fractures of the mid distal tibial shaft and the mid fibula. Electronically Signed   By: Marijo Sanes M.D.   On: 12/31/2019 18:51   DG Knee Complete 4 Views Right  Result Date: 12/31/2019 CLINICAL DATA:  Knee pain after fall EXAM: RIGHT KNEE - COMPLETE 4+ VIEW COMPARISON:  None. FINDINGS: Normal appearance of right total knee arthroplasty. There is a laterally displaced oblique fracture of the midshaft of the right fibula. No knee effusion. IMPRESSION: Laterally displaced oblique fracture of the midshaft of the right fibula. Right total knee arthroplasty without adverse features. Electronically Signed   By: Ulyses Jarred M.D.   On: 12/31/2019 19:39   DG Toe Great Right  Result Date:  12/31/2019 CLINICAL DATA:  Fall with toe pain EXAM: RIGHT GREAT TOE COMPARISON:  None. FINDINGS: There is no evidence of fracture or dislocation. There is no evidence of arthropathy or other focal bone abnormality. Soft tissues are unremarkable. IMPRESSION: Negative. Electronically Signed   By: Ulyses Jarred M.D.   On: 12/31/2019 19:38   Marzetta Board, MD, PhD [facility]  Between 7 am - 7 pm I am available, please contact me via Amion or Securechat  Between 7 pm - 7 am I am not available, please contact night coverage MD/APP via Amion

## 2020-01-01 NOTE — Anesthesia Postprocedure Evaluation (Signed)
Anesthesia Post Note  Patient: Haley Cohen  Procedure(s) Performed: INTRAMEDULLARY (IM) NAIL TIBIAL (Right Leg Lower)     Patient location during evaluation: PACU Anesthesia Type: General Level of consciousness: sedated and patient cooperative Pain management: pain level controlled Vital Signs Assessment: post-procedure vital signs reviewed and stable Respiratory status: spontaneous breathing, nonlabored ventilation and respiratory function stable Cardiovascular status: blood pressure returned to baseline and stable Postop Assessment: no apparent nausea or vomiting Anesthetic complications: no    Last Vitals:  Vitals:   01/01/20 1424 01/01/20 1453  BP: 112/77 (!) 150/85  Pulse: 80 75  Resp: 12 14  Temp: 36.8 C (!) 36.3 C  SpO2: 93% 95%    Last Pain:  Vitals:   01/01/20 1453  TempSrc: Oral  PainSc: 0-No pain                 Haley Cohen,E. Jossiah Smoak

## 2020-01-01 NOTE — Progress Notes (Signed)
Patient just pulled out new iv that was just placed by the iv team.  Will contact on call MD to notify regarding iv abx not given. This is the third iv patient pulled out.

## 2020-01-01 NOTE — Plan of Care (Addendum)
Pt very combative and uncooperative. Pt trying to get out of bed. Pt yelling. Pt removed IV and refuses to allow me to attempt an IV. Notified Dr. Sharlet Salina. Pt refusing labs twice, pt educated and attempt to reorient with no luck. Pt forceful with hiding arms stating you are not getting my blood.   Problem: Education: Goal: Knowledge of General Education information will improve Description: Including pain rating scale, medication(s)/side effects and non-pharmacologic comfort measures Outcome: Progressing   Problem: Activity: Goal: Risk for activity intolerance will decrease Outcome: Progressing   Problem: Pain Managment: Goal: General experience of comfort will improve Outcome: Progressing   Problem: Safety: Goal: Ability to remain free from injury will improve Outcome: Progressing   Problem: Skin Integrity: Goal: Risk for impaired skin integrity will decrease Outcome: Progressing

## 2020-01-01 NOTE — Progress Notes (Signed)
Orthopedic Tech Progress Note Patient Details:  Haley Cohen [birth year] [id number]  Ortho Devices Type of Ortho Device: CAM walker Ortho Device/Splint Location: Right Leg Ortho Device/Splint Interventions: Ordered   Post Interventions Patient Tolerated: Well Instructions Provided: Care of device, Adjustment of device  Delivered CAM Walker per request of OR.  Tammy Sours 01/01/2020, 2:57 PM

## 2020-01-01 NOTE — Anesthesia Preprocedure Evaluation (Addendum)
Anesthesia Evaluation  Patient identified by MRN, date of birth, ID band Patient confused    Reviewed: Allergy & Precautions, NPO status , Patient's Chart, lab work & pertinent test results  History of Anesthesia Complications Negative for: history of anesthetic complications  Airway   TM Distance: >3 FB Neck ROM: Full   Comment: Pt uncooperative Dental   Pulmonary neg pulmonary ROS,  12/31/2019 SARS coronavirus NEG   breath sounds clear to auscultation       Cardiovascular hypertension, Pt. on medications (-) angina Rhythm:Regular Rate:Normal     Neuro/Psych Depression Dementia negative neurological ROS     GI/Hepatic   Endo/Other  Hypothyroidism   Renal/GU Renal InsufficiencyRenal disease     Musculoskeletal   Abdominal   Peds  Hematology negative hematology ROS (+)   Anesthesia Other Findings   Reproductive/Obstetrics                            Anesthesia Physical Anesthesia Plan  ASA: III  Anesthesia Plan: General   Post-op Pain Management:    Induction: Intravenous  PONV Risk Score and Plan: 3 and Ondansetron, Dexamethasone and Treatment may vary due to age or medical condition  Airway Management Planned: Oral ETT  Additional Equipment:   Intra-op Plan:   Post-operative Plan: Extubation in OR  Informed Consent: I have reviewed the patients History and Physical, chart, labs and discussed the procedure including the risks, benefits and alternatives for the proposed anesthesia with the patient or authorized representative who has indicated his/her understanding and acceptance.   Patient has DNR.  Discussed DNR with power of attorney and Suspend DNR.   Consent reviewed with POA  Plan Discussed with: CRNA and Surgeon  Anesthesia Plan Comments: (Discussed with son by telephone Leighton Ruff))       Anesthesia Quick Evaluation

## 2020-01-02 ENCOUNTER — Encounter: Payer: Self-pay | Admitting: *Deleted

## 2020-01-02 DIAGNOSIS — Z419 Encounter for procedure for purposes other than remedying health state, unspecified: Secondary | ICD-10-CM

## 2020-01-02 LAB — CBC
HCT: 28.4 % — ABNORMAL LOW (ref 36.0–46.0)
Hemoglobin: 8.9 g/dL — ABNORMAL LOW (ref 12.0–15.0)
MCH: 30.4 pg (ref 26.0–34.0)
MCHC: 31.3 g/dL (ref 30.0–36.0)
MCV: 96.9 fL (ref 80.0–100.0)
Platelets: 200 10*3/uL (ref 150–400)
RBC: 2.93 MIL/uL — ABNORMAL LOW (ref 3.87–5.11)
RDW: 14.1 % (ref 11.5–15.5)
WBC: 9.9 10*3/uL (ref 4.0–10.5)
nRBC: 0 % (ref 0.0–0.2)

## 2020-01-02 LAB — COMPREHENSIVE METABOLIC PANEL
ALT: 10 U/L (ref 0–44)
AST: 18 U/L (ref 15–41)
Albumin: 2.9 g/dL — ABNORMAL LOW (ref 3.5–5.0)
Alkaline Phosphatase: 59 U/L (ref 38–126)
Anion gap: 11 (ref 5–15)
BUN: 16 mg/dL (ref 8–23)
CO2: 25 mmol/L (ref 22–32)
Calcium: 9 mg/dL (ref 8.9–10.3)
Chloride: 104 mmol/L (ref 98–111)
Creatinine, Ser: 1.17 mg/dL — ABNORMAL HIGH (ref 0.44–1.00)
GFR calc Af Amer: 49 mL/min — ABNORMAL LOW (ref >60)
GFR calc non Af Amer: 42 mL/min — ABNORMAL LOW (ref >60)
Glucose, Bld: 139 mg/dL — ABNORMAL HIGH (ref 70–99)
Potassium: 3.7 mmol/L (ref 3.5–5.1)
Sodium: 140 mmol/L (ref 135–145)
Total Bilirubin: 0.3 mg/dL (ref 0.3–1.2)
Total Protein: 5.9 g/dL — ABNORMAL LOW (ref 6.5–8.1)

## 2020-01-02 MED ORDER — HALOPERIDOL LACTATE 5 MG/ML IJ SOLN
2.0000 mg | Freq: Once | INTRAMUSCULAR | Status: AC
  Administered 2020-01-02: 2 mg via INTRAMUSCULAR
  Filled 2020-01-02: qty 1

## 2020-01-02 MED ORDER — TRAMADOL HCL 50 MG PO TABS: 50.0000 mg | ORAL_TABLET | Freq: Four times a day (QID) | ORAL | 0 refills | Status: DC | PRN

## 2020-01-02 MED ORDER — ASPIRIN EC 325 MG PO TBEC
325.0000 mg | DELAYED_RELEASE_TABLET | Freq: Two times a day (BID) | ORAL | 0 refills | Status: AC
Start: 2020-01-02 — End: 2020-02-01

## 2020-01-02 MED ORDER — QUETIAPINE FUMARATE 25 MG PO TABS
25.0000 mg | ORAL_TABLET | Freq: Every day | ORAL | Status: DC
  Administered 2020-01-02: 25 mg via ORAL
  Filled 2020-01-02 (×2): qty 1

## 2020-01-02 MED ORDER — HALOPERIDOL LACTATE 5 MG/ML IJ SOLN: 2.0000 mg | Freq: Once | INTRAMUSCULAR | Status: DC

## 2020-01-02 NOTE — Progress Notes (Signed)
Patient woke up from a nap and was very agitated and anxious. She doesn't believe she's in the hospital and multiple attempts to reorient her were unsuccessful. Called son [name] and he talked to patient but behavior was unchanged. MD was notified and received new order. Staff member sitting with patient at the moment.

## 2020-01-02 NOTE — Plan of Care (Signed)

## 2020-01-02 NOTE — Progress Notes (Signed)
Patient slid out of chair onto floor mat.  A&O x 1 baseline, no complaint of pain Temp: 99.5  BP: 100/47 HR: 80 Resp: 18 SpO2: 95%  MD Eubanks notified, Tele sitter ordered.

## 2020-01-02 NOTE — Evaluation (Signed)
Physical Therapy Evaluation Patient Details Name: Haley Cohen MRN: [id number] DOB: 1931-10-20 Today's Date: 01/02/2020   History of Present Illness  Pt is a 86 y.o. F with significant PMH of Alzeimer's disease, right hip fracture, HTN, right total knee replacement who presents with right periprosthetic tibial shaft fracture. Now s/p IMN of fracture 01/01/2020.   Clinical Impression  Pt s/p procedure listed above. Presents with decreased functional mobility secondary to RLE pain, weakness, balance deficits, abnormal posture, and cognitive impairment (baseline). Requiring mod-maximal assist for basic transfer from bed to chair. Unable to ambulate. Recommend return to SNF upon discharge. Will continue to follow acutely to progress as tolerated.     Follow Up Recommendations SNF;Supervision/Assistance - 24 hour    Equipment Recommendations  Other (comment)(defer)    Recommendations for Other Services       Precautions / Restrictions Precautions Precautions: Fall Required Braces or Orthoses: Other Brace Other Brace: R CAM boot Restrictions Weight Bearing Restrictions: Yes RLE Weight Bearing: Weight bearing as tolerated      Mobility  Bed Mobility Overal bed mobility: Needs Assistance Bed Mobility: Supine to Sit     Supine to sit: Min assist     General bed mobility comments: Transitioning from supine to sit fairly well, minA for RLE management. Increased time for scooting hips forward to edge of bed.  Transfers Overall transfer level: Needs assistance Equipment used: Rolling walker (2 wheeled);None Transfers: Sit to/from [employer] Sit to Stand: Mod assist;Max assist   Squat pivot transfers: Max assist     General transfer comment: ModA for standing up to walker, increased trunk/hip flexion, fatigued quickly and required sitting back down on edge of bed. Face to face transfer utilized for low pivot from bed to chair with maxA to complete.    Ambulation/Gait                Stairs            Wheelchair Mobility    Modified Rankin (Stroke Patients Only)       Balance Overall balance assessment: Needs assistance Sitting-balance support: Feet supported Sitting balance-Leahy Scale: Good     Standing balance support: Bilateral upper extremity supported Standing balance-Leahy Scale: Poor                               Pertinent Vitals/Pain Pain Assessment: Faces Faces Pain Scale: Hurts even more Pain Location: RLE Pain Descriptors / Indicators: Grimacing;Guarding Pain Intervention(s): Monitored during session    Home Living Family/patient expects to be discharged to:: Skilled nursing facility                      Prior Function Level of Independence: Needs assistance   Gait / Transfers Assistance Needed: walks with RW, h/o falls  ADL's / Homemaking Assistance Needed: likely requiring assist from staff at SNF        Hand Dominance        Extremity/Trunk Assessment   Upper Extremity Assessment Upper Extremity Assessment: Defer to OT evaluation    Lower Extremity Assessment Lower Extremity Assessment: RLE deficits/detail RLE Deficits / Details: R tibial shaft fx s/p IMN. CAM boot donned, able to wiggle toes, good quad set       Communication   Communication: No difficulties  Cognition Arousal/Alertness: Awake/alert Behavior During Therapy: Flat affect Overall Cognitive Status: History of cognitive impairments - at baseline  General Comments: History of Alzheimer's. Able to follow ~75% of 1 step motor commands. Not oriented. Denies she had surgery.      General Comments      Exercises General Exercises - Lower Extremity Ankle Circles/Pumps: Right;10 reps;Seated   Assessment/Plan    PT Assessment Patient needs continued PT services  PT Problem List Decreased strength;Decreased range of motion;Decreased activity  tolerance;Decreased balance;Decreased mobility;Decreased cognition;Decreased safety awareness;Pain       PT Treatment Interventions DME instruction;Gait training;Functional mobility training;Therapeutic activities;Therapeutic exercise;Balance training;Patient/family education;Wheelchair mobility training    PT Goals (Current goals can be found in the Care Plan section)  Acute Rehab PT Goals Patient Stated Goal: unable PT Goal Formulation: Patient unable to participate in goal setting Time For Goal Achievement: 01/16/20 Potential to Achieve Goals: Fair    Frequency Min 3X/week   Barriers to discharge        Co-evaluation               AM-PAC PT "6 Clicks" Mobility  Outcome Measure Help needed turning from your back to your side while in a flat bed without using bedrails?: A Little Help needed moving from lying on your back to sitting on the side of a flat bed without using bedrails?: A Little Help needed moving to and from a bed to a chair (including a wheelchair)?: Total Help needed standing up from a chair using your arms (e.g., wheelchair or bedside chair)?: A Lot Help needed to walk in hospital room?: Total Help needed climbing 3-5 steps with a railing? : Total 6 Click Score: 11    End of Session Equipment Utilized During Treatment: Gait belt Activity Tolerance: Patient limited by pain Patient left: in chair;with call bell/phone within reach;with chair alarm set(posey belt chair alarm) Nurse Communication: Mobility status PT Visit Diagnosis: Unsteadiness on feet (R26.81);Other abnormalities of gait and mobility (R26.89);Difficulty in walking, not elsewhere classified (R26.2);Pain Pain - Right/Left: Right Pain - part of body: Leg    Time: [id number] PT Time Calculation (min) (ACUTE ONLY): 20 min   Charges:   PT Evaluation $PT Eval Moderate Complexity: 1 Mod            Wyona Almas, PT, DPT Acute Rehabilitation Services Pager [phone] Office  [phone]   Deno Etienne 01/02/2020, 2:42 PM

## 2020-01-02 NOTE — Progress Notes (Signed)
Orthopaedic Trauma Progress Note  S: Was noted to be agitated and confused throughout the night, pulled out several IVs as well as a Foley catheter.  Due to staffing, was unable to have one-on-one sitter.  Nursing staff doing hourly checks.  Resting comfortably this morning.  Denies any significant pain in her leg. Has not been up with therapies yet. .  O:  Vitals:   01/02/20 0500 01/02/20 0731  BP: (!) 132/92 (!) 144/76  Pulse: 82 88  Resp: 15 14  Temp: 98.2 F (36.8 C) 98.3 F (36.8 C)  SpO2: 93% 93%    General: Sitting up in bed, answering questions appropriately. No acute distress Respiratory: No increased work of breathing. Lungs CTA anterior lung fields bilaterally Right lower extremity: Cam boot in place, dressings clean, dry, intact.  Mild tenderness with palpation of knee. Able to wiggle each of her toes. Foot warm and well-perfused.  2+ DP pulse with brisk cap refill.  Imaging: Stable post op imaging.   Labs: No results found for this or any previous visit (from the past 24 hour(s)).  Assessment: 86 year old female status post fall, 1 Day Post-Op   Injuries: Right periprosthetic tibial shaft fracture s/p intramedullary nailing  Weightbearing: WBAT RLE  Insicional and dressing care: Plan to remove dressing tomorrow  Orthopedic device(s): CAM boot RLE  CV/Blood loss: Hemoglobin 12.5 on admission, CBC this morning pending. Hemodynamically stable  Pain management:  1. Robaxin 500 mg q 6 hours PRN 2. Tramadol 50-100 mg q 6 hours PRN 3. Morphine 1-2 mg q 2 hours PRN  VTE prophylaxis: Lovenox starting today.  Plan to be discharged on ASA 325 mg BID SCDs: in place LLE  ID:  Ancef 2gm post op  Foley/Lines: No foley, KVO IVFs  Medical co-morbidities: Alzheimer's dementia, hypertension, hypothyroidism, depression   Impediments to Fracture Healing: Vit D level pending, will start supplementation as indicated  Dispo: PT/OT evaluation, dispo pending. Okay for discharge  from ortho standpoint once cleared by medicine team and therapies   Have signed and placed rx for d/c pain medication and DVT prophylaxis in chart  Follow - up plan: 2 weeks after discharge for repeat imaging  Contact information:  Katha Hamming MD, Patrecia Pace PA-C   [facility] [phone] (office) [email or web address]

## 2020-01-02 NOTE — Progress Notes (Signed)
PROGRESS NOTE  Haley Cohen [id number] DOB: 1931-09-24 DOA: 12/31/2019 PCP: [name], MD   LOS: 1 day   Brief Narrative / Interim history: 86 year old female with Alzheimer's dementia, hypertension, hypothyroidism, came into the hospital with mechanical fall and right lower extremity pain.  She had difficulties ambulating.  Patient is unable to provide further details concerning this fall given underlying dementia.  Imaging in the ED showed midshaft fibular fracture as well as right mid distal tibial shaft fracture.  Orthopedic surgery consulted and she is status post operative repair on 5/26  Subjective / 24h Interval events: Appears comfortable, denies any chest pain, abdominal pain, shortness of breath.  Assessment & Plan: Principal Problem Fall with fibula/tibial fractures-appreciate orthopedic surgery consultation, she is status post IM nailing of the right tibial shaft fracture, she will be weightbearing as tolerated with the right lower extremity, orthopedic surgery recommends Lovenox for DVT prophylaxis while inpatient and discharged on aspirin.  Suspect she will need SNF, physical therapy evaluation pending  Active Problems Essential hypertension-continue home antihypertensives, blood pressure stable this morning  Acute blood loss anemia-postoperatively, no bleeding, continue to closely monitor CBC, no need for transfusions today  Hypothyroidism-continue home Synthroid  Dementia-continue home regimen with Depakote, Cymbalta, Namenda, Seroquel, trazodone  Chronic kidney disease, stage IIIa -repeat BMP pending this morning   Scheduled Meds: . divalproex  250 mg Oral BID  . docusate sodium  100 mg Oral BID  . DULoxetine  30 mg Oral Daily  . enoxaparin (LOVENOX) injection  40 mg Subcutaneous Q24H  . levothyroxine  25 mcg Oral QAC breakfast  . lisinopril  10 mg Oral Daily  . LORazepam  1 mg Oral QHS  . memantine  10 mg Oral BID  . methocarbamol  500 mg Oral QID    . QUEtiapine  25 mg Oral QHS  . traZODone  50 mg Oral QHS   Continuous Infusions: .  ceFAZolin (ANCEF) IV 2 g (01/02/20 0449)  . lactated ringers 10 mL/hr at 01/01/20 0950  . methocarbamol (ROBAXIN) IV     PRN Meds:.enalaprilat, metoCLOPramide **OR** metoCLOPramide (REGLAN) injection, morphine injection, ondansetron **OR** ondansetron (ZOFRAN) IV, polyethylene glycol, traMADol  DVT prophylaxis: SCDs Code Status: DNR Family Communication: no family at bedside  Status is: Inpatient   Dispo: The patient is from: Home              Anticipated d/c is to: SNF              Anticipated d/c date is: 2 days              Patient currently is not medically stable to d/c.  Consultants:  Orthopedic surgery  Procedures:  IM nailing right tibia  Microbiology  None   Antimicrobials: Ancef perioperatively   Objective: Vitals:   01/01/20 1914 01/01/20 2300 01/02/20 0500 01/02/20 0731  BP: 135/70 140/77 (!) 132/92 (!) 144/76  Pulse: 96 90 82 88  Resp: 17 16 15 14   Temp: 99 F (37.2 C) 99.1 F (37.3 C) 98.2 F (36.8 C) 98.3 F (36.8 C)  TempSrc: Oral Oral Oral Oral  SpO2: 92% 91% 93% 93%  Weight:      Height:        Intake/Output Summary (Last 24 hours) at 01/02/2020 1042 Last data filed at 01/02/2020 0300 Gross per 24 hour  Intake 1457.67 ml  Output 2225 ml  Net -767.33 ml   Filed Weights   01/01/20 0927  Weight: 69.9 kg    Examination:  Constitutional: No distress, pleasant Eyes: No scleral icterus ENMT: mmm  Neck: normal, supple Respiratory: Clear bilaterally, no wheezing, no crackles Cardiovascular: Regular rate and rhythm, no murmurs, no peripheral edema Abdomen: non distended, no tenderness. Bowel sounds positive.  Musculoskeletal: no clubbing / cyanosis.  Skin: No rashes seen Neurologic: Grossly no focal deficits  Data Reviewed: I have independently reviewed following labs and imaging studies   CBC: Recent Labs  Lab 12/31/19 1900 01/02/20 0923   WBC 10.1 9.9  HGB 12.5 8.9*  HCT 40.1 28.4*  MCV 96.2 96.9  PLT 217 [id number]   Basic Metabolic Panel: Recent Labs  Lab 12/31/19 1900  NA 140  K 4.5  CL 104  CO2 24  GLUCOSE 112*  BUN 15  CREATININE 0.94  CALCIUM 10.4*   Liver Function Tests: No results for input(s): AST, ALT, ALKPHOS, BILITOT, PROT, ALBUMIN in the last 168 hours. Coagulation Profile: Recent Labs  Lab 12/31/19 1900  INR 1.0   HbA1C: No results for input(s): HGBA1C in the last 72 hours. CBG: No results for input(s): GLUCAP in the last 168 hours.  Recent Results (from the past 240 hour(s))  SARS Coronavirus 2 by RT PCR (hospital order, performed in [facility] hospital lab) Nasopharyngeal Nasopharyngeal Swab     Status: None   Collection Time: 12/31/19  6:52 PM   Specimen: Nasopharyngeal Swab  Result Value Ref Range Status   SARS Coronavirus 2 NEGATIVE NEGATIVE Final    Comment: (NOTE) SARS-CoV-2 target nucleic acids are NOT DETECTED. The SARS-CoV-2 RNA is generally detectable in upper and lower respiratory specimens during the acute phase of infection. The lowest concentration of SARS-CoV-2 viral copies this assay can detect is 250 copies / mL. A negative result does not preclude SARS-CoV-2 infection and should not be used as the sole basis for treatment or other patient management decisions.  A negative result may occur with improper specimen collection / handling, submission of specimen other than nasopharyngeal swab, presence of viral mutation(s) within the areas targeted by this assay, and inadequate number of viral copies (<250 copies / mL). A negative result must be combined with clinical observations, patient history, and epidemiological information. Fact Sheet for Patients:   StrictlyIdeas.no Fact Sheet for Healthcare Providers: BankingDealers.co.za This test is not yet approved or cleared  by the [place] FDA and has been authorized for  detection and/or diagnosis of SARS-CoV-2 by FDA under an Emergency Use Authorization (EUA).  This EUA will remain in effect (meaning this test can be used) for the duration of the COVID-19 declaration under Section 564(b)(1) of the Act, 21 U.S.C. section 360bbb-3(b)(1), unless the authorization is terminated or revoked sooner. Performed at [facility], [place] [address] [place], [place] [address]   Surgical pcr screen     Status: None   Collection Time: 01/01/20 12:32 AM   Specimen: Nasal Mucosa; Nasal Swab  Result Value Ref Range Status   MRSA, PCR NEGATIVE NEGATIVE Final   Staphylococcus aureus NEGATIVE NEGATIVE Final    Comment: (NOTE) The Xpert SA Assay (FDA approved for NASAL specimens in patients 35 years of age and older), is one component of a comprehensive surveillance program. It is not intended to diagnose infection nor to guide or monitor treatment. Performed at [facility], [place] [address] [place], [place] [address]      Radiology Studies: DG Tibia/Fibula Right  Result Date: 01/01/2020 CLINICAL DATA:  Right tibia and fibula fractures. EXAM: RIGHT TIBIA AND FIBULA - 2 VIEW;  DG C-ARM 1-60 MIN COMPARISON:  12/31/2019 FINDINGS: Nine C-arm views of the right lower leg demonstrate intramedullary rod and screw fixation of the previously demonstrated distal tibia fracture with essentially anatomic position and alignment. The previously described fibular shaft fracture is again noted. Stable knee prosthesis. IMPRESSION: Hardware fixation of the previously demonstrated distal tibia fracture. Electronically Signed   By: Claudie Revering M.D.   On: 01/01/2020 13:59   DG Tibia/Fibula Right Port  Result Date: 01/01/2020 CLINICAL DATA:  Status post right tibial fixation. EXAM: PORTABLE RIGHT TIBIA AND FIBULA - 2 VIEW COMPARISON:  Dec 31, 2019. FINDINGS: Status post intramedullary rod fixation of distal right tibial fracture. Good alignment of fracture components is  noted. Stable appearance of mid fibular shaft fracture. IMPRESSION: Status post intramedullary rod fixation of distal right tibial fracture. Electronically Signed   By: Marijo Conception M.D.   On: 01/01/2020 16:08   DG C-Arm 1-60 Min  Result Date: 01/01/2020 CLINICAL DATA:  Right tibia and fibula fractures. EXAM: RIGHT TIBIA AND FIBULA - 2 VIEW; DG C-ARM 1-60 MIN COMPARISON:  12/31/2019 FINDINGS: Nine C-arm views of the right lower leg demonstrate intramedullary rod and screw fixation of the previously demonstrated distal tibia fracture with essentially anatomic position and alignment. The previously described fibular shaft fracture is again noted. Stable knee prosthesis. IMPRESSION: Hardware fixation of the previously demonstrated distal tibia fracture. Electronically Signed   By: Claudie Revering M.D.   On: 01/01/2020 13:59   Marzetta Board, MD, PhD [facility]  Between 7 am - 7 pm I am available, please contact me via Amion or Securechat  Between 7 pm - 7 am I am not available, please contact night coverage MD/APP via Amion

## 2020-01-02 NOTE — Progress Notes (Signed)
Iv team came again to place 4th iv for her antibiotics.  Patient was very angry and aggressive towards them.  They decided not to place 4th iv.

## 2020-01-02 NOTE — Progress Notes (Signed)
Finally another new iv was placed, Kerlix wrapped around it.  Patient receiving needed antibiotic.

## 2020-01-02 NOTE — Progress Notes (Signed)
Haldol IM was given at 0310 to calm patient to allow for IV placement.  IV team was notified in system.  Waiting for theie response.

## 2020-01-02 NOTE — Progress Notes (Signed)
OT Cancellation Note  Patient Details Name: Haley Cohen MRN: [id number] DOB: [birth year]   Cancelled Treatment:    Reason Eval/Treat Not Completed: Fatigue/lethargy limiting ability to participate.  Pt sleeping soundly.  Nilsa Nutting., OTR/L Acute Rehabilitation Services Pager [phone] Office [phone]  [name] 01/02/2020, 2:25 PM

## 2020-01-02 NOTE — TOC Initial Note (Signed)
Transition of Care [facility]) - Initial/Assessment Note    Patient Details  Name: Haley Cohen MRN: [id number] Date of Birth: 1932-07-24  Transition of Care [facility]) CM/SW Contact:    Curlene Labrum, RN Phone Number: 01/02/2020, 4:16 PM  Clinical Narrative:                 Case Management called and spoke with the son, Leighton Ruff, who lives here in [place], [place].  Patient is a S/P Right periprosthetic tibial shaft fracture repair.  The patient was currently living in the memory care apartment of [facility] [place].  Her son would like her to return to the skilled facility there.  I called and left a message with Angela Nevin, RNCM at [facility].  SNF work up was started and will follow up with the facility regarding insurance authorization.  Expected Discharge Plan: [place]     Patient Goals and CMS Choice Patient states their goals for this hospitalization and ongoing recovery are:: Son would like her to return to the skilled nursing department at [facility] [place] - patient came from the memory unit at [place] [email or web address].gov Compare Post Acute Care list provided to:: Patient Represenative (must comment)    Expected Discharge Plan and Services Expected Discharge Plan: [place]   Discharge Planning Services: CM Consult Post Acute Care Choice: [place] Living arrangements for the past 2 months: Assisted Living Facility([facility])                                      Prior Living Arrangements/Services Living arrangements for the past 2 months: Assisted Living Facility([facility])   Patient language and need for interpreter reviewed:: Yes Do you feel safe going back to the place where you live?: Yes      Need for Family Participation in Patient Care: Yes (Comment)     Criminal Activity/Legal Involvement Pertinent to Current  Situation/Hospitalization: No - Comment as needed  Activities of Daily Living Home Assistive Devices/Equipment: None ADL Screening (condition at time of admission) Patient's cognitive ability adequate to safely complete daily activities?: No Is the patient deaf or have difficulty hearing?: No Does the patient have difficulty seeing, even when wearing glasses/contacts?: No Does the patient have difficulty concentrating, remembering, or making decisions?: Yes Patient able to express need for assistance with ADLs?: No Does the patient have difficulty dressing or bathing?: Yes Independently performs ADLs?: No Does the patient have difficulty walking or climbing stairs?: Yes Weakness of Legs: Right Weakness of Arms/Hands: None  Permission Sought/Granted Permission sought to share information with : Case Manager Permission granted to share information with : Yes, Verbal Permission Granted     Permission granted to share info w AGENCY: [facility] - Left message with Angela Nevin, [facility]  Permission granted to share info w Relationship: son, Leighton Ruff [id number]     Emotional Assessment Appearance:: Appears stated age   Affect (typically observed): Quiet Orientation: : Oriented to Self Alcohol / Substance Use: Not Applicable Psych Involvement: No (comment)  Admission diagnosis:  Fracture of tibial shaft, left, closed [S82.202A] Closed fracture of right tibia and fibula, initial encounter [S82.201A, S82.401A] Tibial fracture [S82.209A] Patient Active Problem List   Diagnosis Date Noted  . Tibial fracture 01/01/2020  . Hypercalcemia 12/31/2019  . Right fibular fracture 12/31/2019  .  Fracture of tibial shaft, right, closed 12/31/2019  . Fracture of tibial shaft, left, closed 12/31/2019  . Chronic kidney disease, stage 3a 11/11/2019  . Slow transit constipation 09/25/2019  . Blood loss anemia 04/19/2019  . Mixed Alzheimer's and vascular dementia ([place]) 04/09/2019   . Gait abnormality 04/09/2019  . History of fracture of right hip 04/04/2019  . Essential hypertension 04/04/2019  . Hypothyroidism 04/04/2019  . Edema 07/11/2018  . Insomnia 06/15/2018  . Incontinent of urine 04/27/2018  . Fall 02/02/2018  . B12 deficiency 10/17/2017  . Depression, recurrent ([place]) 10/27/2016  . Osteoarthritis 10/27/2016   PCP:  [name], MD Pharmacy:   [place], [place] - [place] [address] [place] [address] Phone: [phone] Fax: [phone]     Social Determinants of Health ([place]) Interventions    Readmission Risk Interventions Readmission Risk Prevention Plan 01/02/2020  Transportation Screening Complete  PCP or Specialist Appt within 5-7 Days Complete  Home Care Screening Complete  Medication Review (RN CM) Complete  Some recent data might be hidden

## 2020-01-02 NOTE — NC FL2 (Addendum)
[place] MEDICAID FL2 LEVEL OF CARE SCREENING TOOL     IDENTIFICATION  Patient Name: Haley Cohen Birthdate: 08-17-1931 Sex: female Admission Date (Current Location): 12/31/2019  [facility] and [place] Number:  Herbalist and Address:  The [place], [place] [address], [place], [place] [address]      Provider Number: [id number]  Attending Physician Name and Address:  Caren Griffins, MD  Relative Name and Phone Number:  Leighton Ruff [phone]    Current Level of Care: Hospital Recommended Level of Care: [place] Prior Approval Number:    Date Approved/Denied:   PASRR Number:  [id number]  Discharge Plan: SNF    Current Diagnoses: Patient Active Problem List   Diagnosis Date Noted  . Tibial fracture 01/01/2020  . Hypercalcemia 12/31/2019  . Right fibular fracture 12/31/2019  . Fracture of tibial shaft, right, closed 12/31/2019  . Fracture of tibial shaft, left, closed 12/31/2019  . Chronic kidney disease, stage 3a 11/11/2019  . Slow transit constipation 09/25/2019  . Blood loss anemia 04/19/2019  . Mixed Alzheimer's and vascular dementia ([place]) 04/09/2019  . Gait abnormality 04/09/2019  . History of fracture of right hip 04/04/2019  . Essential hypertension 04/04/2019  . Hypothyroidism 04/04/2019  . Edema 07/11/2018  . Insomnia 06/15/2018  . Incontinent of urine 04/27/2018  . Fall 02/02/2018  . B12 deficiency 10/17/2017  . Depression, recurrent ([place]) 10/27/2016  . Osteoarthritis 10/27/2016    Orientation RESPIRATION BLADDER Height & Weight     Self  Normal Indwelling catheter Weight: 69.9 kg Height:  4\' 11"  (149.9 cm)  BEHAVIORAL SYMPTOMS/MOOD NEUROLOGICAL BOWEL NUTRITION STATUS      Continent Diet(See Discharge Summary)  AMBULATORY STATUS COMMUNICATION OF NEEDS Skin   Total Care Verbally Surgical wounds                       Personal Care Assistance Level of Assistance  Bathing, Dressing Bathing  Assistance: Limited assistance   Dressing Assistance: Limited assistance     Functional Limitations Info  Sight, Hearing, Speech Sight Info: Adequate Hearing Info: Adequate Speech Info: Adequate    SPECIAL CARE FACTORS FREQUENCY  PT (By licensed PT), OT (By licensed OT)     PT Frequency: 5 times per week OT Frequency: 5 times per week            Contractures Contractures Info: Not present    Additional Factors Info  Code Status, Allergies Code Status Info: DNR Allergies Info: NKDA           Current Medications (01/02/2020):  This is the current hospital active medication list Current Facility-Administered Medications  Medication Dose Route Frequency Provider Last Rate Last Admin  . divalproex (DEPAKOTE) DR tablet 250 mg  250 mg Oral BID Patrecia Pace A, PA-C   250 mg at 01/02/20 1009  . docusate sodium (COLACE) capsule 100 mg  100 mg Oral BID Patrecia Pace A, PA-C   100 mg at 01/02/20 1009  . DULoxetine (CYMBALTA) DR capsule 30 mg  30 mg Oral Daily Patrecia Pace A, PA-C   30 mg at 01/02/20 1009  . enalaprilat (VASOTEC) injection 1.25 mg  1.25 mg Intravenous Q6H PRN Patrecia Pace A, PA-C   1.25 mg at 12/31/19 2241  . enoxaparin (LOVENOX) injection 40 mg  40 mg Subcutaneous Q24H Patrecia Pace A, PA-C   40 mg at 01/02/20 1008  . lactated ringers infusion   Intravenous Continuous Delray Alt, PA-C  10 mL/hr at 01/01/20 0950 New Bag at 01/01/20 1319  . levothyroxine (SYNTHROID) tablet 25 mcg  25 mcg Oral QAC breakfast Delray Alt, PA-C   25 mcg at 01/02/20 [id number]  . lisinopril (ZESTRIL) tablet 10 mg  10 mg Oral Daily Patrecia Pace A, PA-C   10 mg at 01/02/20 1009  . LORazepam (ATIVAN) tablet 1 mg  1 mg Oral QHS Patrecia Pace A, PA-C   1 mg at 01/01/20 2242  . memantine (NAMENDA) tablet 10 mg  10 mg Oral BID Patrecia Pace A, PA-C   10 mg at 01/02/20 1009  . methocarbamol (ROBAXIN) tablet 500 mg  500 mg Oral QID Patrecia Pace A, PA-C   500 mg at 01/02/20 1009   Or  .  methocarbamol (ROBAXIN) 500 mg in dextrose 5 % 50 mL IVPB  500 mg Intravenous QID Patrecia Pace A, PA-C      . metoCLOPramide (REGLAN) tablet 5-10 mg  5-10 mg Oral Q8H PRN Patrecia Pace A, PA-C       Or  . metoCLOPramide (REGLAN) injection 5-10 mg  5-10 mg Intravenous Q8H PRN Patrecia Pace A, PA-C      . morphine 2 MG/ML injection 1-2 mg  1-2 mg Intravenous Q2H PRN Delray Alt, PA-C      . ondansetron (ZOFRAN) tablet 4 mg  4 mg Oral Q6H PRN Delray Alt, PA-C       Or  . ondansetron (ZOFRAN) injection 4 mg  4 mg Intravenous Q6H PRN Delray Alt, PA-C      . polyethylene glycol (MIRALAX / GLYCOLAX) packet 17 g  17 g Oral Daily PRN Delray Alt, PA-C      . QUEtiapine (SEROQUEL) tablet 25 mg  25 mg Oral QHS Patrecia Pace A, PA-C   25 mg at 01/01/20 2242  . traMADol (ULTRAM) tablet 50-100 mg  50-100 mg Oral Q6H PRN Patrecia Pace A, PA-C   50 mg at 01/02/20 1009  . traZODone (DESYREL) tablet 50 mg  50 mg Oral QHS Patrecia Pace A, PA-C   50 mg at 01/01/20 2242     Discharge Medications: Please see discharge summary for a list of discharge medications.  Relevant Imaging Results:  Relevant Lab Results:   Additional Information SS# [id number]  Curlene Labrum, RN

## 2020-01-02 NOTE — Discharge Instructions (Signed)
Orthopaedic Trauma Service Discharge Instructions   General Discharge Instructions  WEIGHT BEARING STATUS: Weightbearing as tolerated right leg in CAM boot  RANGE OF MOTION/ACTIVITY: Okay for unrestricted knee motion. Okay to be weightbearing for transfers and walker ambulation as tolerated on right leg  Wound Care: Incisions can be left open to air if there is no drainage. If incision continues to have drainage, follow wound care instructions below. Okay to shower if no drainage from incisions.  DVT/PE prophylaxis: Aspirin 325 mg BID x 30 days  Diet: as you were eating previously.  Can use over the counter stool softeners and bowel preparations, such as Miralax, to help with bowel movements.  Narcotics can be constipating.  Be sure to drink plenty of fluids  PAIN MEDICATION USE AND EXPECTATIONS  You have likely been given narcotic medications to help control your pain.  After a traumatic event that results in an fracture (broken bone) with or without surgery, it is ok to use narcotic pain medications to help control one's pain.  We understand that everyone responds to pain differently and each individual patient will be evaluated on a regular basis for the continued need for narcotic medications. Ideally, narcotic medication use should last no more than 6-8 weeks (coinciding with fracture healing).   As a patient it is your responsibility as well to monitor narcotic medication use and report the amount and frequency you use these medications when you come to your office visit.   We would also advise that if you are using narcotic medications, you should take a dose prior to therapy to maximize you participation.  IF YOU ARE ON NARCOTIC MEDICATIONS IT IS NOT PERMISSIBLE TO OPERATE A MOTOR VEHICLE (MOTORCYCLE/CAR/TRUCK/MOPED) OR HEAVY MACHINERY DO NOT MIX NARCOTICS WITH OTHER CNS (CENTRAL NERVOUS SYSTEM) DEPRESSANTS SUCH AS ALCOHOL   STOP SMOKING OR USING NICOTINE PRODUCTS!!!!  As  discussed nicotine severely impairs your body's ability to heal surgical and traumatic wounds but also impairs bone healing.  Wounds and bone heal by forming microscopic blood vessels (angiogenesis) and nicotine is a vasoconstrictor (essentially, shrinks blood vessels).  Therefore, if vasoconstriction occurs to these microscopic blood vessels they essentially disappear and are unable to deliver necessary nutrients to the healing tissue.  This is one modifiable factor that you can do to dramatically increase your chances of healing your injury.    (This means no smoking, no nicotine gum, patches, etc)  DO NOT USE NONSTEROIDAL ANTI-INFLAMMATORY DRUGS (NSAID'S)  Using products such as Advil (ibuprofen), Aleve (naproxen), Motrin (ibuprofen) for additional pain control during fracture healing can delay and/or prevent the healing response.  If you would like to take over the counter (OTC) medication, Tylenol (acetaminophen) is ok.  However, some narcotic medications that are given for pain control contain acetaminophen as well. Therefore, you should not exceed more than 4000 mg of tylenol in a day if you do not have liver disease.  Also note that there are may OTC medicines, such as cold medicines and allergy medicines that my contain tylenol as well.  If you have any questions about medications and/or interactions please ask your doctor/PA or your pharmacist.      ICE AND ELEVATE INJURED/OPERATIVE EXTREMITY  Using ice and elevating the injured extremity above your heart can help with swelling and pain control.  Icing in a pulsatile fashion, such as 20 minutes on and 20 minutes off, can be followed.    Do not place ice directly on skin. Make sure there is a  barrier between to skin and the ice pack.    Using frozen items such as frozen peas works well as the conform nicely to the are that needs to be iced.  USE AN ACE WRAP OR TED HOSE FOR SWELLING CONTROL  In addition to icing and elevation, Ace wraps or TED  hose are used to help limit and resolve swelling.  It is recommended to use Ace wraps or TED hose until you are informed to stop.    When using Ace Wraps start the wrapping distally (farthest away from the body) and wrap proximally (closer to the body)   Example: If you had surgery on your leg or thing and you do not have a splint on, start the ace wrap at the toes and work your way up to the thigh        If you had surgery on your upper extremity and do not have a splint on, start the ace wrap at your fingers and work your way up to the upper arm   IF YOU ARE IN A CAM BOOT (BLACK BOOT)  You may remove boot periodically. Perform daily dressing changes as noted below.  Wash the liner of the boot regularly and wear a sock when wearing the boot. It is recommended that you sleep in the boot until told otherwise   CALL THE OFFICE WITH ANY QUESTIONS OR CONCERNS: [phone]   VISIT OUR WEBSITE FOR ADDITIONAL INFORMATION: [email or web address]    Discharge Wound Care Instructions  Do NOT apply any ointments, solutions or lotions to pin sites or surgical wounds.  These prevent needed drainage and even though solutions like hydrogen peroxide kill bacteria, they also damage cells lining the pin sites that help fight infection.  Applying lotions or ointments can keep the wounds moist and can cause them to breakdown and open up as well. This can increase the risk for infection. When in doubt call the office.  Surgical incisions should be dressed daily.  If any drainage is noted, use one layer of adaptic, then gauze, Kerlix, and an ace wrap.  Once the incision is completely dry and without drainage, it may be left open to air out.  Showering may begin 36-48 hours later.  Cleaning gently with soap and water.  Traumatic wounds should be dressed daily as well.    One layer of adaptic, gauze, Kerlix, then ace wrap.  The adaptic can be discontinued once the draining has ceased    If you have a wet to dry  dressing: wet the gauze with saline the squeeze as much saline out so the gauze is moist (not soaking wet), place moistened gauze over wound, then place a dry gauze over the moist one, followed by Kerlix wrap, then ace wrap.

## 2020-01-03 ENCOUNTER — Non-Acute Institutional Stay (SKILLED_NURSING_FACILITY): Payer: Medicare Other | Admitting: Internal Medicine

## 2020-01-03 ENCOUNTER — Encounter: Payer: Self-pay | Admitting: Internal Medicine

## 2020-01-03 DIAGNOSIS — E039 Hypothyroidism, unspecified: Secondary | ICD-10-CM | POA: Diagnosis not present

## 2020-01-03 DIAGNOSIS — G309 Alzheimer's disease, unspecified: Secondary | ICD-10-CM | POA: Diagnosis not present

## 2020-01-03 DIAGNOSIS — I10 Essential (primary) hypertension: Secondary | ICD-10-CM

## 2020-01-03 DIAGNOSIS — D500 Iron deficiency anemia secondary to blood loss (chronic): Secondary | ICD-10-CM | POA: Diagnosis not present

## 2020-01-03 DIAGNOSIS — F0150 Vascular dementia without behavioral disturbance: Secondary | ICD-10-CM

## 2020-01-03 DIAGNOSIS — S82242S Displaced spiral fracture of shaft of left tibia, sequela: Secondary | ICD-10-CM | POA: Diagnosis not present

## 2020-01-03 DIAGNOSIS — F0280 Dementia in other diseases classified elsewhere without behavioral disturbance: Secondary | ICD-10-CM | POA: Diagnosis not present

## 2020-01-03 LAB — CBC
HCT: 28.1 % — ABNORMAL LOW (ref 36.0–46.0)
Hemoglobin: 8.9 g/dL — ABNORMAL LOW (ref 12.0–15.0)
MCH: 30.7 pg (ref 26.0–34.0)
MCHC: 31.7 g/dL (ref 30.0–36.0)
MCV: 96.9 fL (ref 80.0–100.0)
Platelets: 177 10*3/uL (ref 150–400)
RBC: 2.9 MIL/uL — ABNORMAL LOW (ref 3.87–5.11)
RDW: 14.6 % (ref 11.5–15.5)
WBC: 8.5 10*3/uL (ref 4.0–10.5)
nRBC: 0 % (ref 0.0–0.2)

## 2020-01-03 LAB — BASIC METABOLIC PANEL
Anion gap: 5 (ref 5–15)
BUN: 17 mg/dL (ref 8–23)
CO2: 29 mmol/L (ref 22–32)
Calcium: 9.4 mg/dL (ref 8.9–10.3)
Chloride: 106 mmol/L (ref 98–111)
Creatinine, Ser: 1.04 mg/dL — ABNORMAL HIGH (ref 0.44–1.00)
GFR calc Af Amer: 56 mL/min — ABNORMAL LOW (ref >60)
GFR calc non Af Amer: 49 mL/min — ABNORMAL LOW (ref >60)
Glucose, Bld: 101 mg/dL — ABNORMAL HIGH (ref 70–99)
Potassium: 3.8 mmol/L (ref 3.5–5.1)
Sodium: 140 mmol/L (ref 135–145)

## 2020-01-03 LAB — VITAMIN D 25 HYDROXY (VIT D DEFICIENCY, FRACTURES): Vit D, 25-Hydroxy: 31.63 ng/mL (ref 30–100)

## 2020-01-03 MED ORDER — HALOPERIDOL LACTATE 5 MG/ML IJ SOLN: 2.0000 mg | INTRAMUSCULAR | Status: DC | PRN

## 2020-01-03 MED ORDER — LORAZEPAM 0.5 MG PO TABS: 0.5000 mg | ORAL_TABLET | Freq: Every day | ORAL | 1 refills | Status: DC

## 2020-01-03 NOTE — Progress Notes (Signed)
OT Cancellation Note  Patient Details Name: Haley Cohen MRN: [id number] DOB: [birth year]   Cancelled Treatment:    Reason Eval/Treat Not Completed: OT screened, no needs identified, will sign off.  Pt discharging to SNF today, and OT eval not needed for authorization.  Will defer OT eval to SNF.   Nilsa Nutting., OTR/L Acute Rehabilitation Services Pager [phone] Office [phone]   [name] 01/03/2020, 12:59 PM

## 2020-01-03 NOTE — Progress Notes (Deleted)
PROGRESS NOTE  Haley Cohen [id number] DOB: [birth year] DOA: 12/31/2019 PCP: [name], MD   LOS: 2 days   Brief Narrative / Interim history: 86 year old female with Alzheimer's dementia, hypertension, hypothyroidism, came into the hospital with mechanical fall and right lower extremity pain.  She had difficulties ambulating.  Patient is unable to provide further details concerning this fall given underlying dementia.  Imaging in the ED showed midshaft fibular fracture as well as right mid distal tibial shaft fracture.  Orthopedic surgery consulted and she is status post operative repair on 5/26  Subjective / 24h Interval events: Appears comfortable, denies any chest pain, abdominal pain, shortness of breath.  Assessment & Plan: Principal Problem Fall with fibula/tibial fractures-appreciate orthopedic surgery consultation, she is status post IM nailing of the right tibial shaft fracture, she will be weightbearing as tolerated with the right lower extremity, orthopedic surgery recommends Lovenox for DVT prophylaxis while inpatient and discharged on aspirin. -She requires SNF, placement is pending  Active Problems Essential hypertension-continue home antihypertensives, blood pressure stable this morning  Acute blood loss anemia-postoperatively, no bleeding, continue to closely monitor CBC, no need for transfusions today and hemoglobin has remained stable today compared to yesterday  Hypothyroidism-continue home Synthroid  Dementia with behavioral disturbances-continue home regimen with Depakote, Cymbalta, Namenda, Seroquel, trazodone.  Patient was increasingly agitated overnight and slid off the chair onto the ground.  Required sitter overnight.  Avoid Ativan in 86 year old patient, I will add as needed Haldol  Chronic kidney disease, stage IIIa -creatinine stable this morning   Scheduled Meds: . divalproex  250 mg Oral BID  . docusate sodium  100 mg Oral BID  . DULoxetine   30 mg Oral Daily  . enoxaparin (LOVENOX) injection  40 mg Subcutaneous Q24H  . levothyroxine  25 mcg Oral QAC breakfast  . lisinopril  10 mg Oral Daily  . LORazepam  1 mg Oral QHS  . memantine  10 mg Oral BID  . methocarbamol  500 mg Oral QID  . QUEtiapine  25 mg Oral QHS  . traZODone  50 mg Oral QHS   Continuous Infusions: . lactated ringers 10 mL/hr at 01/01/20 0950  . methocarbamol (ROBAXIN) IV     PRN Meds:.enalaprilat, metoCLOPramide **OR** metoCLOPramide (REGLAN) injection, morphine injection, ondansetron **OR** ondansetron (ZOFRAN) IV, polyethylene glycol, traMADol  DVT prophylaxis: SCDs Code Status: DNR Family Communication: no family at bedside  Status is: Inpatient   Dispo: The patient is from: Home              Anticipated d/c is to: SNF, when able to be off sitter for 24 hours              Anticipated d/c date is: 2 days              Patient currently is not medically stable to d/c.  Consultants:  Orthopedic surgery  Procedures:  IM nailing right tibia  Microbiology  None   Antimicrobials: Ancef perioperatively   Objective: Vitals:   01/02/20 1500 01/02/20 2112 01/02/20 2300 01/03/20 0352  BP: (!) 148/78 (!) 107/52 (!) 100/47 132/63  Pulse: 85  80 83  Resp: 15 16 18 16   Temp: 98.2 F (36.8 C)  99.5 F (37.5 C) 98.8 F (37.1 C)  TempSrc: Oral  Oral Oral  SpO2: 96% 96% 95% 94%  Weight:      Height:        Intake/Output Summary (Last 24 hours) at 01/03/2020 1029 Last data filed at 01/02/2020  1700 Gross per 24 hour  Intake 240 ml  Output --  Net 240 ml   Filed Weights   01/01/20 0927  Weight: 69.9 kg    Examination:  Constitutional: No distress, in chair, sitter at bedside Eyes: No scleral icterus ENMT: mmm Neck: normal, supple Respiratory: Clear bilaterally, no wheezing heard Cardiovascular: Regular rate and rhythm, no murmurs appreciated.  No edema Abdomen: Soft, nondistended, bowel sounds positive Musculoskeletal: no clubbing /  cyanosis.  Skin: No rashes appreciated Neurologic: Grossly nonfocal, does not follow commands consistently  Data Reviewed: I have independently reviewed following labs and imaging studies   CBC: Recent Labs  Lab 12/31/19 1900 01/02/20 0923 01/03/20 0615  WBC 10.1 9.9 8.5  HGB 12.5 8.9* 8.9*  HCT 40.1 28.4* 28.1*  MCV 96.2 96.9 96.9  PLT 217 200 [id number]   Basic Metabolic Panel: Recent Labs  Lab 12/31/19 1900 01/02/20 0923 01/03/20 0615  NA 140 140 140  K 4.5 3.7 3.8  CL 104 104 106  CO2 24 25 29   GLUCOSE 112* 139* 101*  BUN 15 16 17   CREATININE 0.94 1.17* 1.04*  CALCIUM 10.4* 9.0 9.4   Liver Function Tests: Recent Labs  Lab 01/02/20 0923  AST 18  ALT 10  ALKPHOS 59  BILITOT 0.3  PROT 5.9*  ALBUMIN 2.9*   Coagulation Profile: Recent Labs  Lab 12/31/19 1900  INR 1.0   HbA1C: No results for input(s): HGBA1C in the last 72 hours. CBG: No results for input(s): GLUCAP in the last 168 hours.  Recent Results (from the past 240 hour(s))  SARS Coronavirus 2 by RT PCR (hospital order, performed in [facility] hospital lab) Nasopharyngeal Nasopharyngeal Swab     Status: None   Collection Time: 12/31/19  6:52 PM   Specimen: Nasopharyngeal Swab  Result Value Ref Range Status   SARS Coronavirus 2 NEGATIVE NEGATIVE Final    Comment: (NOTE) SARS-CoV-2 target nucleic acids are NOT DETECTED. The SARS-CoV-2 RNA is generally detectable in upper and lower respiratory specimens during the acute phase of infection. The lowest concentration of SARS-CoV-2 viral copies this assay can detect is 250 copies / mL. A negative result does not preclude SARS-CoV-2 infection and should not be used as the sole basis for treatment or other patient management decisions.  A negative result may occur with improper specimen collection / handling, submission of specimen other than nasopharyngeal swab, presence of viral mutation(s) within the areas targeted by this assay, and inadequate number  of viral copies (<250 copies / mL). A negative result must be combined with clinical observations, patient history, and epidemiological information. Fact Sheet for Patients:   StrictlyIdeas.no Fact Sheet for Healthcare Providers: BankingDealers.co.za This test is not yet approved or cleared  by the [place] FDA and has been authorized for detection and/or diagnosis of SARS-CoV-2 by FDA under an Emergency Use Authorization (EUA).  This EUA will remain in effect (meaning this test can be used) for the duration of the COVID-19 declaration under Section 564(b)(1) of the Act, 21 U.S.C. section 360bbb-3(b)(1), unless the authorization is terminated or revoked sooner. Performed at [facility], [place] [address] [place], [place] [address]   Surgical pcr screen     Status: None   Collection Time: 01/01/20 12:32 AM   Specimen: Nasal Mucosa; Nasal Swab  Result Value Ref Range Status   MRSA, PCR NEGATIVE NEGATIVE Final   Staphylococcus aureus NEGATIVE NEGATIVE Final    Comment: (NOTE) The Xpert SA Assay (FDA approved for  NASAL specimens in patients 74 years of age and older), is one component of a comprehensive surveillance program. It is not intended to diagnose infection nor to guide or monitor treatment. Performed at [facility], [place] [address] [place], [place] [address]      Radiology Studies: No results found. Marzetta Board, MD, PhD [facility]  Between 7 am - 7 pm I am available, please contact me via Amion or Securechat  Between 7 pm - 7 am I am not available, please contact night coverage MD/APP via Amion

## 2020-01-03 NOTE — Progress Notes (Signed)
Telesitter ordered, no telesitter available.  Staff at bedside during the night

## 2020-01-03 NOTE — Progress Notes (Signed)
Orthopaedic Trauma Progress Note  S: Had fall from bed overnight, no new injuries.  Resting comfortably this morning.  Denies any significant pain in her leg.  Has no questions or concerns currently.  O:  Vitals:   01/02/20 2300 01/03/20 0352  BP: (!) 100/47 132/63  Pulse: 80 83  Resp: 18 16  Temp: 99.5 F (37.5 C) 98.8 F (37.1 C)  SpO2: 95% 94%    General: Resting comfortably in bedside chair.  No acute distress Respiratory: No increased work of breathing.  Right lower extremity: Cam boot in place, dressings clean, dry, intact.  Mild tenderness with palpation of knee. Able to wiggle each of her toes. Foot warm and well-perfused.  2+ DP pulse with brisk cap refill.  Imaging: Stable post op imaging.   Labs:  Results for orders placed or performed during the hospital encounter of 12/31/19 (from the past 24 hour(s))  Comprehensive metabolic panel     Status: Abnormal   Collection Time: 01/02/20  9:23 AM  Result Value Ref Range   Sodium 140 135 - 145 mmol/L   Potassium 3.7 3.5 - 5.1 mmol/L   Chloride 104 98 - 111 mmol/L   CO2 25 22 - 32 mmol/L   Glucose, Bld 139 (H) 70 - 99 mg/dL   BUN 16 8 - 23 mg/dL   Creatinine, Ser 1.17 (H) 0.44 - 1.00 mg/dL   Calcium 9.0 8.9 - 10.3 mg/dL   Total Protein 5.9 (L) 6.5 - 8.1 g/dL   Albumin 2.9 (L) 3.5 - 5.0 g/dL   AST 18 15 - 41 U/L   ALT 10 0 - 44 U/L   Alkaline Phosphatase 59 38 - 126 U/L   Total Bilirubin 0.3 0.3 - 1.2 mg/dL   GFR calc non Af Amer 42 (L) >60 mL/min   GFR calc Af Amer 49 (L) >60 mL/min   Anion gap 11 5 - 15  CBC     Status: Abnormal   Collection Time: 01/02/20  9:23 AM  Result Value Ref Range   WBC 9.9 4.0 - 10.5 K/uL   RBC 2.93 (L) 3.87 - 5.11 MIL/uL   Hemoglobin 8.9 (L) 12.0 - 15.0 g/dL   HCT 28.4 (L) 36.0 - 46.0 %   MCV 96.9 80.0 - 100.0 fL   MCH 30.4 26.0 - 34.0 pg   MCHC 31.3 30.0 - 36.0 g/dL   RDW 14.1 11.5 - 15.5 %   Platelets 200 150 - 400 K/uL   nRBC 0.0 0.0 - 0.2 %  Basic metabolic panel     Status:  Abnormal   Collection Time: 01/03/20  6:15 AM  Result Value Ref Range   Sodium 140 135 - 145 mmol/L   Potassium 3.8 3.5 - 5.1 mmol/L   Chloride 106 98 - 111 mmol/L   CO2 29 22 - 32 mmol/L   Glucose, Bld 101 (H) 70 - 99 mg/dL   BUN 17 8 - 23 mg/dL   Creatinine, Ser 1.04 (H) 0.44 - 1.00 mg/dL   Calcium 9.4 8.9 - 10.3 mg/dL   GFR calc non Af Amer 49 (L) >60 mL/min   GFR calc Af Amer 56 (L) >60 mL/min   Anion gap 5 5 - 15  CBC     Status: Abnormal   Collection Time: 01/03/20  6:15 AM  Result Value Ref Range   WBC 8.5 4.0 - 10.5 K/uL   RBC 2.90 (L) 3.87 - 5.11 MIL/uL   Hemoglobin 8.9 (L) 12.0 - 15.0 g/dL  HCT 28.1 (L) 36.0 - 46.0 %   MCV 96.9 80.0 - 100.0 fL   MCH 30.7 26.0 - 34.0 pg   MCHC 31.7 30.0 - 36.0 g/dL   RDW 14.6 11.5 - 15.5 %   Platelets 177 150 - 400 K/uL   nRBC 0.0 0.0 - 0.2 %    Assessment: 86 year old female status post fall, 2 Days Post-Op   Injuries: Right periprosthetic tibial shaft fracture s/p intramedullary nailing  Weightbearing: WBAT RLE  Insicional and dressing care: Plan to remove dressing tomorrow  Orthopedic device(s): CAM boot RLE  CV/Blood loss: Acute blood loss anemia.  Hemoglobin 8.9 this morning.  Hemodynamically stable  Pain management:  1. Robaxin 500 mg q 6 hours PRN 2. Tramadol 50-100 mg q 6 hours PRN 3. Morphine 1-2 mg q 2 hours PRN  VTE prophylaxis: Lovenox.  Plan to be discharged on ASA 325 mg BID SCDs: in place LLE  ID:  Ancef 2gm post op completed  Foley/Lines: No foley, KVO IVFs  Medical co-morbidities: Alzheimer's dementia, hypertension, hypothyroidism, depression   Impediments to Fracture Healing: Vit D level pending, will start supplementation as indicated  Dispo: PT/OT evaluation, recommending SNF. Okay for discharge from ortho standpoint once cleared by medicine team and therapies   Have signed and placed rx for d/c pain medication and DVT prophylaxis in chart  Follow - up plan: 2 weeks after discharge for repeat  imaging  Contact information:  Katha Hamming MD, Patrecia Pace PA-C   [facility] [phone] (office) [email or web address]

## 2020-01-03 NOTE — Progress Notes (Signed)
Location: [facility] Room Number: 042/A Place of Service:  SNF (31)  Provider:   Code Status:  Goals of Care:  Advanced Directives 01/03/2020  Does Patient Have a Medical Advance Directive? Yes  Type of Advance Directive Out of facility DNR (pink MOST or yellow form);Living will  Does patient want to make changes to medical advance directive? No - Patient declined  Copy of [place] in Chart? -  Would patient like information on creating a medical advance directive? -  Pre-existing out of facility DNR order (yellow form or pink MOST form) Yellow form placed in chart (order not valid for inpatient use)     Chief Complaint  Patient presents with  . New Admit To SNF    HPI: Patient is a 86 y.o. female seen today for an acute visit for Readmission to SNF  Was admitted in the Hospital from 5/25-5/28 for Fibula Tibia Fractures s/p IM rod Placement  Patient has h/o Hypertension, Hypothyroidism, Alzheimer's Dementia with behavior issues, Depression, B12 def, S/P Right Hip fracture with Intramedullary Fixation in 8/27  Patient lives in a memory unit And Has severe Behavior issues including trying to leave the facility and not sleeping whole nights. Had Fall in the facility. It was mechanical Fall Sustained Mid Distal Tibia and Fibula Fracture Underwent IM rod placement. Is now discharged to SNF for therapy Patient continues to have behavior issues.  She is trying to get up from her chair.  Is trying to climb up from her bed.  Complaining of pain. Facility has lowered her bed to help with the falls     Past Medical History:  Diagnosis Date  . Alzheimer disease ([place]) 10/27/2016  . Arthritis   . Confusion 04/04/2019  . Depression, recurrent ([place]) 10/27/2016  . History of fracture of right hip 04/04/2019   07/01/19 Ortho, R hip incision healed, X-ray looks good, s/p IMN R hip fx, WBAT, f/u 6 mos.   Marland Kitchen HTN (hypertension) 10/27/2016  .  Hypertension   . Hypertensive kidney disease with CKD (chronic kidney disease) stage V ([place]) 11/02/2016   11/02/16 Na 136, K 4.7, Bun 30, creat 1.52, TSH 0.86, wbc 6.7, Hgb 11.3, plt 264  . Hypothyroidism 10/27/2016  . Osteoarthritis 10/27/2016  . Thyroid disease     Past Surgical History:  Procedure Laterality Date  . ABDOMINAL HYSTERECTOMY  1986  . INTRAMEDULLARY (IM) NAIL INTERTROCHANTERIC Right 04/04/2019   Procedure: INTRAMEDULLARY (IM) NAIL INTERTROCHANTRIC;  Surgeon: Rod Can, MD;  Location: [facility];  Service: Orthopedics;  Laterality: Right;  . KNEE ARTHROSCOPY  2015/2017   Dr. Gustavus Bryant  . SPINE SURGERY  2009, 2011   Dr.Mark Nicoletta Dress  . TIBIA IM NAIL INSERTION Right 01/01/2020   Procedure: INTRAMEDULLARY (IM) NAIL TIBIAL;  Surgeon: Shona Needles, MD;  Location: [facility];  Service: Orthopedics;  Laterality: Right;    No Known Allergies  Outpatient Encounter Medications as of 01/03/2020  Medication Sig  . acetaminophen (TYLENOL) 325 MG tablet Take 650 mg by mouth 2 (two) times daily.  Marland Kitchen aspirin EC 325 MG tablet Take 1 tablet (325 mg total) by mouth in the morning and at bedtime.  . Calcium Carbonate (CALCIUM 600 PO) Take 1 tablet by mouth daily.  . cholecalciferol (VITAMIN D3) 25 MCG (1000 UT) tablet Take 1,000 Units by mouth daily.  . divalproex (DEPAKOTE) 250 MG DR tablet Take 250 mg by mouth in the morning and at bedtime.  . docusate sodium (COLACE) 100  MG capsule Take 1 capsule (100 mg total) by mouth 2 (two) times daily.  . DULoxetine (CYMBALTA) 30 MG capsule Take 30 mg by mouth daily.  Marland Kitchen levothyroxine (SYNTHROID, LEVOTHROID) 25 MCG tablet Take 25 mcg by mouth daily before breakfast.  . lisinopril (ZESTRIL) 10 MG tablet Take 10 mg by mouth daily.  Marland Kitchen LORazepam (ATIVAN) 0.5 MG tablet Take 0.5 mg by mouth at bedtime.  . memantine (NAMENDA) 10 MG tablet Take 10 mg by mouth 2 (two) times daily.  . QUEtiapine (SEROQUEL) 25 MG tablet Take 25 mg by mouth at bedtime.  . traMADol  (ULTRAM) 50 MG tablet Take by mouth every 6 (six) hours as needed for moderate pain.  . [DISCONTINUED] oxyCODONE (OXY IR/ROXICODONE) 5 MG immediate release tablet Take 5 mg by mouth every 6 (six) hours.  . [DISCONTINUED] LORazepam (ATIVAN) 0.5 MG tablet Take 1 tablet (0.5 mg total) by mouth at bedtime.  . [DISCONTINUED] LORazepam (ATIVAN) 1 MG tablet Take 1 mg by mouth at bedtime.  . [DISCONTINUED] memantine (NAMENDA) 10 MG tablet Take 10 mg by mouth 2 (two) times daily.   . [DISCONTINUED] QUEtiapine (SEROQUEL) 25 MG tablet Take 25 mg by mouth at bedtime.   . [DISCONTINUED] traMADol (ULTRAM) 50 MG tablet Take 1 tablet (50 mg total) by mouth every 6 (six) hours as needed for severe pain.  . [DISCONTINUED] traZODone (DESYREL) 50 MG tablet Take 50 mg by mouth at bedtime.   . [DISCONTINUED] vitamin B-12 (CYANOCOBALAMIN) 1000 MCG tablet Take 1,000 mcg by mouth. Mondays and Wednesdays  . [DISCONTINUED] vitamin C (ASCORBIC ACID) 500 MG tablet Take 500 mg by mouth daily.  . [DISCONTINUED] divalproex (DEPAKOTE) DR tablet 250 mg   . [DISCONTINUED] docusate sodium (COLACE) capsule 100 mg   . [DISCONTINUED] DULoxetine (CYMBALTA) DR capsule 30 mg   . [DISCONTINUED] enalaprilat (VASOTEC) injection 1.25 mg   . [DISCONTINUED] enoxaparin (LOVENOX) injection 40 mg   . [DISCONTINUED] haloperidol lactate (HALDOL) injection 2 mg   . [DISCONTINUED] lactated ringers infusion   . [DISCONTINUED] levothyroxine (SYNTHROID) tablet 25 mcg   . [DISCONTINUED] lisinopril (ZESTRIL) tablet 10 mg   . [DISCONTINUED] LORazepam (ATIVAN) tablet 1 mg   . [DISCONTINUED] memantine (NAMENDA) tablet 10 mg   . [DISCONTINUED] methocarbamol (ROBAXIN) 500 mg in dextrose 5 % 50 mL IVPB   . [DISCONTINUED] methocarbamol (ROBAXIN) tablet 500 mg   . [DISCONTINUED] metoCLOPramide (REGLAN) injection 5-10 mg   . [DISCONTINUED] metoCLOPramide (REGLAN) tablet 5-10 mg   . [DISCONTINUED] morphine 2 MG/ML injection 1-2 mg   . [DISCONTINUED]  ondansetron (ZOFRAN) injection 4 mg   . [DISCONTINUED] ondansetron (ZOFRAN) tablet 4 mg   . [DISCONTINUED] polyethylene glycol (MIRALAX / GLYCOLAX) packet 17 g   . [DISCONTINUED] QUEtiapine (SEROQUEL) tablet 25 mg   . [DISCONTINUED] traMADol (ULTRAM) tablet 50-100 mg   . [DISCONTINUED] traZODone (DESYREL) tablet 50 mg    No facility-administered encounter medications on file as of 01/03/2020.    Review of Systems:  Review of Systems  Unable to perform ROS: Dementia    Health Maintenance  Topic Date Due  . TETANUS/TDAP  06/22/2027  . DEXA SCAN  Completed  . COVID-19 Vaccine  Completed  . PNA vac Low Risk Adult  Completed  . INFLUENZA VACCINE  Discontinued    Physical Exam: Vitals:   01/03/20 1657  BP: 130/68  Pulse: 75  Resp: 16  Temp: 99.5 F (37.5 C)  SpO2: 97%  Weight: 154 lb 3.2 oz (69.9 kg)  Height: 4\' 11"  (  1.499 m)   Body mass index is 31.14 kg/m. Physical Exam  Constitutional:  Well-developed and well-nourished.  HENT:  Head: Normocephalic.  Mouth/Throat: Oropharynx is clear and moist.  Eyes: Pupils are equal, round, and reactive to light.  Neck: Neck supple.  Cardiovascular: Normal rate and normal heart sounds.  No murmur heard. Pulmonary/Chest: Effort normal and breath sounds normal. No respiratory distress. No wheezes. She has no rales.  Abdominal: Soft. Bowel sounds are normal. No distension. There is no tenderness. There is no rebound.  Musculoskeletal: No edema. Right leg in the Extension with the Brace Lymphadenopathy: none Neurological: Alert and responding Appropriately Skin: Skin is warm and dry.  Psychiatric: Anxious and Trying to get up to walk  Labs reviewed: Basic Metabolic Panel: Recent Labs    04/11/19 0000 04/25/19 0000 12/31/19 1900 01/02/20 0923 01/03/20 0615  NA 140   < > 140 140 140  K 4.2   < > 4.5 3.7 3.8  CL  --    < > 104 104 106  CO2  --    < > 24 25 29   GLUCOSE  --   --  112* 139* 101*  BUN 10   < > 15 16 17     CREATININE 0.8   < > 0.94 1.17* 1.04*  CALCIUM  --    < > 10.4* 9.0 9.4  TSH 2.43  --   --   --   --    < > = values in this interval not displayed.   Liver Function Tests: Recent Labs    07/30/19 0000 11/07/19 0000 01/02/20 0923  AST 11* 10* 18  ALT 8 7 10   ALKPHOS 117 92 59  BILITOT  --   --  0.3  PROT  --   --  5.9*  ALBUMIN 3.8  --  2.9*   No results for input(s): LIPASE, AMYLASE in the last 8760 hours. No results for input(s): AMMONIA in the last 8760 hours. CBC: Recent Labs    04/07/19 0324 04/25/19 0000 04/25/19 0000 07/30/19 0000 09/03/19 0000 11/07/19 0000 12/31/19 1900 01/02/20 0923 01/03/20 0615  WBC   < > 6.3   < > 6.1   < > 5.3 10.1 9.9 8.5  NEUTROABS  --  4,145  --  3,630  --  2,417  --   --   --   HGB   < > 10.3*   < > 12.7   < > 11.8* 12.5 8.9* 8.9*  HCT   < > 32*   < > 39   < > 36 40.1 28.4* 28.1*  MCV   < >  --   --   --   --   --  96.2 96.9 96.9  PLT   < > 369   < > 232   < > 221 217 200 177   < > = values in this interval not displayed.   Lipid Panel: No results for input(s): CHOL, HDL, LDLCALC, TRIG, CHOLHDL, LDLDIRECT in the last 8760 hours. Lab Results  Component Value Date   HGBA1C 5.3 11/01/2016    Procedures since last visit: DG Tibia/Fibula Right  Result Date: 01/01/2020 CLINICAL DATA:  Right tibia and fibula fractures. EXAM: RIGHT TIBIA AND FIBULA - 2 VIEW; DG C-ARM 1-60 MIN COMPARISON:  12/31/2019 FINDINGS: Nine C-arm views of the right lower leg demonstrate intramedullary rod and screw fixation of the previously demonstrated distal tibia fracture with essentially anatomic position and alignment. The previously described fibular  shaft fracture is again noted. Stable knee prosthesis. IMPRESSION: Hardware fixation of the previously demonstrated distal tibia fracture. Electronically Signed   By: Claudie Revering M.D.   On: 01/01/2020 13:59   DG Ankle Complete Right  Result Date: 12/31/2019 CLINICAL DATA:  [place].  Tib fib deformity. EXAM:  RIGHT ANKLE - COMPLETE 3+ VIEW COMPARISON:  None. FINDINGS: Displaced spiral type fracture of the mid distal tibial shaft and the mid fibular shaft. There is 1 shaft width of lateral and anterior displacement of the tibial fracture with a moderate-sized butterfly fragment. The ankle and hindfoot bony structures are intact. IMPRESSION: Displaced spiral type fractures of the mid distal tibial shaft and the mid fibula. Electronically Signed   By: Marijo Sanes M.D.   On: 12/31/2019 18:51   DG Knee Complete 4 Views Right  Result Date: 12/31/2019 CLINICAL DATA:  Knee pain after fall EXAM: RIGHT KNEE - COMPLETE 4+ VIEW COMPARISON:  None. FINDINGS: Normal appearance of right total knee arthroplasty. There is a laterally displaced oblique fracture of the midshaft of the right fibula. No knee effusion. IMPRESSION: Laterally displaced oblique fracture of the midshaft of the right fibula. Right total knee arthroplasty without adverse features. Electronically Signed   By: Ulyses Jarred M.D.   On: 12/31/2019 19:39   DG Tibia/Fibula Right Port  Result Date: 01/01/2020 CLINICAL DATA:  Status post right tibial fixation. EXAM: PORTABLE RIGHT TIBIA AND FIBULA - 2 VIEW COMPARISON:  Dec 31, 2019. FINDINGS: Status post intramedullary rod fixation of distal right tibial fracture. Good alignment of fracture components is noted. Stable appearance of mid fibular shaft fracture. IMPRESSION: Status post intramedullary rod fixation of distal right tibial fracture. Electronically Signed   By: Marijo Conception M.D.   On: 01/01/2020 16:08   DG Toe Great Right  Result Date: 12/31/2019 CLINICAL DATA:  Fall with toe pain EXAM: RIGHT GREAT TOE COMPARISON:  None. FINDINGS: There is no evidence of fracture or dislocation. There is no evidence of arthropathy or other focal bone abnormality. Soft tissues are unremarkable. IMPRESSION: Negative. Electronically Signed   By: Ulyses Jarred M.D.   On: 12/31/2019 19:38   DG C-Arm 1-60 Min  Result  Date: 01/01/2020 CLINICAL DATA:  Right tibia and fibula fractures. EXAM: RIGHT TIBIA AND FIBULA - 2 VIEW; DG C-ARM 1-60 MIN COMPARISON:  12/31/2019 FINDINGS: Nine C-arm views of the right lower leg demonstrate intramedullary rod and screw fixation of the previously demonstrated distal tibia fracture with essentially anatomic position and alignment. The previously described fibular shaft fracture is again noted. Stable knee prosthesis. IMPRESSION: Hardware fixation of the previously demonstrated distal tibia fracture. Electronically Signed   By: Claudie Revering M.D.   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

## 2020-01-03 NOTE — Discharge Summary (Signed)
Physician Discharge Summary  Haley Cohen [id number] DOB: Jun 05, 1932 DOA: 12/31/2019  PCP: [name], MD  Admit date: 12/31/2019 Discharge date: 01/03/2020  Admitted From: SNF Disposition: SNF  Recommendations for Outpatient Follow-up:  1. Follow up with PCP in 1-2 weeks 2. Follow-up with orthopedic surgery as scheduled  Home Health: none Equipment/Devices: none  Discharge Condition: stable CODE STATUS: DNR Diet recommendation: regular  HPI: Per admitting MD, 86 year old female with past medical history of Alzheimer's dementia, hypertension, hypothyroidism, osteoarthritis, depression who presents to [facility] emergency department status post fall with right lower extremity pain. She is an extremely poor historian secondary to advanced dementia.  According to the emergency department staff, patient presented status post fall with right lower extremity pain, presumed to be mechanical in nature.  Patient herself is unable to provide any further details concerning this fall.  Patient has a notable history of right hip fracture under similar circumstances in 2020. Upon evaluation in the emergency department, patient has undergone radiographic work-up revealing right midshaft fibular fracture as well as right mid distal tibial shaft fracture.  Dr. Alma Friendly with orthopedic surgery has already been contacted who is already evaluated the patient in a splint has been placed of the right lower extremity.  Hospitalist group has now been called to assess the patient for admission the hospital.  Hospital Course / Discharge diagnoses: Principal Problem Fall with fibula/tibial fractures-appreciate orthopedic surgery consultation, she is status post IM nailing of the right tibial shaft fracture, she will be weightbearing as tolerated with the right lower extremity, orthopedic surgery recommends Lovenox for DVT prophylaxis while inpatient and discharged on aspirin.  She will be discharged  [facility]  Active Problems Essential hypertension-continue home antihypertensives, blood pressure is stable Acute blood loss anemia-postoperatively, no bleeding, hemoglobin has remained stable today Hypothyroidism-continue home Synthroid Dementia with behavioral disturbances-continue home regimen with Depakote, Cymbalta, Namenda, Seroquel, trazodone. Chronic kidney disease, stage IIIa -creatinine stable this morning  Discharge Instructions   Allergies as of 01/03/2020   No Known Allergies     Medication List    TAKE these medications   acetaminophen 325 MG tablet Commonly known as: TYLENOL Take 650 mg by mouth 2 (two) times daily.   aspirin EC 325 MG tablet Take 1 tablet (325 mg total) by mouth in the morning and at bedtime.   CALCIUM 600 PO Take 1 tablet by mouth daily.   cholecalciferol 25 MCG (1000 UNIT) tablet Commonly known as: VITAMIN D3 Take 1,000 Units by mouth daily.   divalproex 250 MG DR tablet Commonly known as: DEPAKOTE Take 250 mg by mouth in the morning and at bedtime.   docusate sodium 100 MG capsule Commonly known as: COLACE Take 1 capsule (100 mg total) by mouth 2 (two) times daily.   DULoxetine 30 MG capsule Commonly known as: CYMBALTA Take 30 mg by mouth daily.   levothyroxine 25 MCG tablet Commonly known as: SYNTHROID Take 25 mcg by mouth daily before breakfast.   lisinopril 10 MG tablet Commonly known as: ZESTRIL Take 10 mg by mouth daily.   LORazepam 1 MG tablet Commonly known as: ATIVAN Take 1 mg by mouth at bedtime.   memantine 10 MG tablet Commonly known as: NAMENDA Take 10 mg by mouth 2 (two) times daily.   QUEtiapine 25 MG tablet Commonly known as: SEROQUEL Take 25 mg by mouth at bedtime.   traMADol 50 MG tablet Commonly known as: ULTRAM Take 1 tablet (50 mg total) by  mouth every 6 (six) hours as needed for severe pain.   traZODone 50 MG tablet Commonly known as: DESYREL Take 50 mg by mouth at  bedtime.   vitamin B-12 1000 MCG tablet Commonly known as: CYANOCOBALAMIN Take 1,000 mcg by mouth. Mondays and Wednesdays   vitamin C 500 MG tablet Commonly known as: ASCORBIC ACID Take 500 mg by mouth daily.      Follow-up Information    Haddix, Thomasene Lot, MD. Schedule an appointment as soon as possible for a visit in 2 week(s).   Specialty: Orthopedic Surgery Why: for repeat x-rays, wound check Contact information: [place] [address] [phone]           Consultations:  Orthopedic surgery   Procedures/Studies: IM nailing of the right tibial shaft fracture 5.26  DG Tibia/Fibula Right  Result Date: 01/01/2020 CLINICAL DATA:  Right tibia and fibula fractures. EXAM: RIGHT TIBIA AND FIBULA - 2 VIEW; DG C-ARM 1-60 MIN COMPARISON:  12/31/2019 FINDINGS: Nine C-arm views of the right lower leg demonstrate intramedullary rod and screw fixation of the previously demonstrated distal tibia fracture with essentially anatomic position and alignment. The previously described fibular shaft fracture is again noted. Stable knee prosthesis. IMPRESSION: Hardware fixation of the previously demonstrated distal tibia fracture. Electronically Signed   By: Claudie Revering M.D.   On: 01/01/2020 13:59   DG Ankle Complete Right  Result Date: 12/31/2019 CLINICAL DATA:  [place].  Tib fib deformity. EXAM: RIGHT ANKLE - COMPLETE 3+ VIEW COMPARISON:  None. FINDINGS: Displaced spiral type fracture of the mid distal tibial shaft and the mid fibular shaft. There is 1 shaft width of lateral and anterior displacement of the tibial fracture with a moderate-sized butterfly fragment. The ankle and hindfoot bony structures are intact. IMPRESSION: Displaced spiral type fractures of the mid distal tibial shaft and the mid fibula. Electronically Signed   By: Marijo Sanes M.D.   On: 12/31/2019 18:51   DG Knee Complete 4 Views Right  Result Date: 12/31/2019 CLINICAL DATA:  Knee pain after fall EXAM:  RIGHT KNEE - COMPLETE 4+ VIEW COMPARISON:  None. FINDINGS: Normal appearance of right total knee arthroplasty. There is a laterally displaced oblique fracture of the midshaft of the right fibula. No knee effusion. IMPRESSION: Laterally displaced oblique fracture of the midshaft of the right fibula. Right total knee arthroplasty without adverse features. Electronically Signed   By: Ulyses Jarred M.D.   On: 12/31/2019 19:39   DG Tibia/Fibula Right Port  Result Date: 01/01/2020 CLINICAL DATA:  Status post right tibial fixation. EXAM: PORTABLE RIGHT TIBIA AND FIBULA - 2 VIEW COMPARISON:  Dec 31, 2019. FINDINGS: Status post intramedullary rod fixation of distal right tibial fracture. Good alignment of fracture components is noted. Stable appearance of mid fibular shaft fracture. IMPRESSION: Status post intramedullary rod fixation of distal right tibial fracture. Electronically Signed   By: Marijo Conception M.D.   On: 01/01/2020 16:08   DG Toe Great Right  Result Date: 12/31/2019 CLINICAL DATA:  Fall with toe pain EXAM: RIGHT GREAT TOE COMPARISON:  None. FINDINGS: There is no evidence of fracture or dislocation. There is no evidence of arthropathy or other focal bone abnormality. Soft tissues are unremarkable. IMPRESSION: Negative. Electronically Signed   By: Ulyses Jarred M.D.   On: 12/31/2019 19:38   DG C-Arm 1-60 Min  Result Date: 01/01/2020 CLINICAL DATA:  Right tibia and fibula fractures. EXAM: RIGHT TIBIA AND FIBULA - 2 VIEW; DG C-ARM 1-60 MIN COMPARISON:  12/31/2019 FINDINGS: Nine C-arm views of the right lower leg demonstrate intramedullary rod and screw fixation of the previously demonstrated distal tibia fracture with essentially anatomic position and alignment. The previously described fibular shaft fracture is again noted. Stable knee prosthesis. IMPRESSION: Hardware fixation of the previously demonstrated distal tibia fracture. Electronically Signed   By: Claudie Revering M.D.   On: 01/01/2020 13:59        Subjective: -underlying dementia. No complaints  Discharge Exam: BP 132/63 (BP Location: Right Arm)   Pulse 83   Temp 98.8 F (37.1 C) (Oral)   Resp 16   Ht 4\' 11"  (1.499 m)   Wt 69.9 kg   SpO2 94%   BMI 31.14 kg/m   General: Pt is alert, awake, not in acute distress Cardiovascular: RRR, S1/S2 +, no rubs, no gallops Respiratory: CTA bilaterally, no wheezing, no rhonchi Abdominal: Soft, NT, ND, bowel sounds + Extremities: no edema, no cyanosis    The results of significant diagnostics from this hospitalization (including imaging, microbiology, ancillary and laboratory) are listed below for reference.     Microbiology: Recent Results (from the past 240 hour(s))  SARS Coronavirus 2 by RT PCR (hospital order, performed in [facility] hospital lab) Nasopharyngeal Nasopharyngeal Swab     Status: None   Collection Time: 12/31/19  6:52 PM   Specimen: Nasopharyngeal Swab  Result Value Ref Range Status   SARS Coronavirus 2 NEGATIVE NEGATIVE Final    Comment: (NOTE) SARS-CoV-2 target nucleic acids are NOT DETECTED. The SARS-CoV-2 RNA is generally detectable in upper and lower respiratory specimens during the acute phase of infection. The lowest concentration of SARS-CoV-2 viral copies this assay can detect is 250 copies / mL. A negative result does not preclude SARS-CoV-2 infection and should not be used as the sole basis for treatment or other patient management decisions.  A negative result may occur with improper specimen collection / handling, submission of specimen other than nasopharyngeal swab, presence of viral mutation(s) within the areas targeted by this assay, and inadequate number of viral copies (<250 copies / mL). A negative result must be combined with clinical observations, patient history, and epidemiological information. Fact Sheet for Patients:   StrictlyIdeas.no Fact Sheet for Healthcare  Providers: BankingDealers.co.za This test is not yet approved or cleared  by the [place] FDA and has been authorized for detection and/or diagnosis of SARS-CoV-2 by FDA under an Emergency Use Authorization (EUA).  This EUA will remain in effect (meaning this test can be used) for the duration of the COVID-19 declaration under Section 564(b)(1) of the Act, 21 U.S.C. section 360bbb-3(b)(1), unless the authorization is terminated or revoked sooner. Performed at [facility], [place] [address], [place], [place] [address]   Surgical pcr screen     Status: None   Collection Time: 01/01/20 12:32 AM   Specimen: Nasal Mucosa; Nasal Swab  Result Value Ref Range Status   MRSA, PCR NEGATIVE NEGATIVE Final   Staphylococcus aureus NEGATIVE NEGATIVE Final    Comment: (NOTE) The Xpert SA Assay (FDA approved for NASAL specimens in patients 58 years of age and older), is one component of a comprehensive surveillance program. It is not intended to diagnose infection nor to guide or monitor treatment. Performed at [facility], [place] [address], [place], [place] [address]      Labs: Basic Metabolic Panel: Recent Labs  Lab 12/31/19 1900 01/02/20 0923 01/03/20 0615  NA 140 140 140  K 4.5 3.7 3.8  CL 104  104 106  CO2 24 25 29   GLUCOSE 112* 139* 101*  BUN 15 16 17   CREATININE 0.94 1.17* 1.04*  CALCIUM 10.4* 9.0 9.4   Liver Function Tests: Recent Labs  Lab 01/02/20 0923  AST 18  ALT 10  ALKPHOS 59  BILITOT 0.3  PROT 5.9*  ALBUMIN 2.9*   CBC: Recent Labs  Lab 12/31/19 1900 01/02/20 0923 01/03/20 0615  WBC 10.1 9.9 8.5  HGB 12.5 8.9* 8.9*  HCT 40.1 28.4* 28.1*  MCV 96.2 96.9 96.9  PLT 217 200 177   CBG: No results for input(s): GLUCAP in the last 168 hours. Hgb A1c No results for input(s): HGBA1C in the last 72 hours. Lipid Profile No results for input(s): CHOL, HDL, LDLCALC, TRIG, CHOLHDL, LDLDIRECT in the last 72  hours. Thyroid function studies No results for input(s): TSH, T4TOTAL, T3FREE, THYROIDAB in the last 72 hours.  Invalid input(s): FREET3 Urinalysis    Component Value Date/Time   COLORURINE YELLOW 04/05/2019 0430   APPEARANCEUR CLEAR 04/05/2019 0430   LABSPEC 1.021 04/05/2019 0430   PHURINE 5.0 04/05/2019 0430   GLUCOSEU NEGATIVE 04/05/2019 0430   HGBUR SMALL (A) 04/05/2019 0430   BILIRUBINUR NEGATIVE 04/05/2019 0430   KETONESUR NEGATIVE 04/05/2019 0430   PROTEINUR NEGATIVE 04/05/2019 0430   NITRITE NEGATIVE 04/05/2019 0430   LEUKOCYTESUR SMALL (A) 04/05/2019 0430    FURTHER DISCHARGE INSTRUCTIONS:   Get Medicines reviewed and adjusted: Please take all your medications with you for your next visit with your Primary MD   Laboratory/radiological data: Please request your Primary MD to go over all hospital tests and procedure/radiological results at the follow up, please ask your Primary MD to get all Hospital records sent to his/her office.   In some cases, they will be blood work, cultures and biopsy results pending at the time of your discharge. Please request that your primary care M.D. goes through all the records of your hospital data and follows up on these results.   Also Note the following: If you experience worsening of your admission symptoms, develop shortness of breath, life threatening emergency, suicidal or homicidal thoughts you must seek medical attention immediately by calling 911 or calling your MD immediately  if symptoms less severe.   You must read complete instructions/literature along with all the possible adverse reactions/side effects for all the Medicines you take and that have been prescribed to you. Take any new Medicines after you have completely understood and accpet all the possible adverse reactions/side effects.    Do not drive when taking Pain medications or sleeping medications (Benzodaizepines)   Do not take more than prescribed Pain, Sleep and  Anxiety Medications. It is not advisable to combine anxiety,sleep and pain medications without talking with your primary care practitioner   Special Instructions: If you have smoked or chewed Tobacco  in the last 2 yrs please stop smoking, stop any regular Alcohol  and or any Recreational drug use.   Wear Seat belts while driving.   Please note: You were cared for by a hospitalist during your hospital stay. Once you are discharged, your primary care physician will handle any further medical issues. Please note that NO REFILLS for any discharge medications will be authorized once you are discharged, as it is imperative that you return to your primary care physician (or establish a relationship with a primary care physician if you do not have one) for your post hospital discharge needs so that they can reassess your need for medications and monitor  your lab values.  Time coordinating discharge: 40 minutes  SIGNED:  Marzetta Board, MD, PhD 01/03/2020, 11:12 AM

## 2020-01-03 NOTE — Progress Notes (Signed)
[facility] to [facility]. Report called in to [facility]. Left via [facility].

## 2020-01-03 NOTE — TOC CAGE-AID Note (Signed)
Transition of Care [facility]) - CAGE-AID Screening   Patient Details  Name: Haley Cohen MRN: [id number] Date of Birth: 01-Aug-1932  Transition of Care [facility]) CM/SW Contact:    Emeterio Reeve, [place] Phone Number: 01/03/2020, 10:55 AM   Clinical Narrative: Pt was unable to participate in assessment due to pt not being oriented.    CAGE-AID Screening: Substance Abuse Screening unable to be completed due to: : Patient unable to participate                  [place] Clinical Social Worker [phone]

## 2020-01-03 NOTE — Progress Notes (Signed)
01/02/20 2300  What Happened  Was fall witnessed? No  Was patient injured? No  Patient found on floor (on floormat)  Found by Staff-comment Apolonio Schneiders, RN)  Stated prior activity to/from bed, chair, or stretcher  Follow Up  MD notified Dewaine Oats  Time MD notified 2320  Family notified Yes - comment (call-no answer)  Time family notified 0050  Additional tests No  Progress note created (see row info) Yes  Adult Fall Risk Assessment  Risk Factor Category (scoring not indicated) High fall risk per protocol (document High fall risk)  Age 86  Fall History: Fall within 6 months prior to admission 5  Elimination; Bowel and/or Urine Incontinence 0  Elimination; Bowel and/or Urine Urgency/Frequency 0  Medications: includes PCA/Opiates, Anti-convulsants, Anti-hypertensives, Diuretics, Hypnotics, Laxatives, Sedatives, and Psychotropics 3  Patient Care Equipment 2  Mobility-Assistance 2  Mobility-Gait 2  Mobility-Sensory Deficit 0  Altered awareness of immediate physical environment 1  Impulsiveness 2  Lack of understanding of one's physical/cognitive limitations 4  Total Score 24  Patient Fall Risk Level High fall risk  Adult Fall Risk Interventions  Required Bundle Interventions *See Row Information* High fall risk - low, moderate, and high requirements implemented  Additional Interventions Use of appropriate toileting equipment (bedpan, BSC, etc.);Lap belt while in chair/wheelchair  Screening for Fall Injury Risk (To be completed on HIGH fall risk patients) - Assessing Need for Low Bed  Risk For Fall Injury- Low Bed Criteria Admitted as a result of a fall  Will Implement Low Bed and Floor Mats Yes  Screening for Fall Injury Risk (To be completed on HIGH fall risk patients who do not meet crieteria for Low Bed) - Assessing Need for Floor Mats Only  Risk For Fall Injury- Criteria for Floor Mats Confusion/dementia (+NuDESC, CIWA, TBI, etc.)  Will Implement Floor Mats Yes  Vitals  Temp  99.5 F (37.5 C)  Temp Source Oral  BP (!) 100/47  MAP (mmHg) (!) 64  BP Location Right Arm  BP Method Automatic  Patient Position (if appropriate) Sitting  Pulse Rate 80  Pulse Rate Source Monitor  Resp 18  Oxygen Therapy  SpO2 95 %  O2 Device Nasal Cannula  O2 Flow Rate (L/min) 2 L/min  Pain Assessment  Pain Scale 0-10  Pain Score 0  PAINAD (Pain Assessment in Advanced Dementia)  Breathing 0  Negative Vocalization 0  Facial Expression 0  Body Language 0  Consolability 0  PAINAD Score 0  PCA/Epidural/Spinal Assessment  Respiratory Pattern Regular;Unlabored  Neurological  Neuro (WDL) X  Level of Consciousness Alert  Orientation Level Oriented to person;Disoriented to place;Disoriented to time;Disoriented to situation  Cognition Memory impairment;Impulsive;Poor attention/concentration;Poor judgement;Poor safety awareness  Speech Clear  RLE Motor Response Purposeful movement  RLE Sensation Full sensation  RLE Motor Strength 4  Neuro Symptoms Forgetful  Musculoskeletal  Musculoskeletal (WDL) X  Assistive Device Front wheel walker  Generalized Weakness Yes  Weight Bearing Restrictions Yes  RLE Weight Bearing WBAT  Musculoskeletal Details  RLE Limited movement;Injury/trauma;Surgery  RLE Ortho/Supportive Device Ace wrap;Ortho Boot  Integumentary  Integumentary (WDL) X  Skin Color Appropriate for ethnicity  Skin Condition Dry  Skin Integrity Surgical Incision (see LDA);MASD  Moisture Associated Skin Damage Location Groin  Moisture Associated Skin Damage Orientation Right;Left  Moisture Associated Skin Damage Intervention Other (Comment) (assessed)  Skin Turgor Non-tenting  Pain Assessment  Date Pain First Started  (no pain)  Result of Injury No  Pain Screening  Clinical Progression Not changed  Pain Assessment  Work-Related Injury No

## 2020-01-04 ENCOUNTER — Encounter: Payer: Self-pay | Admitting: Internal Medicine

## 2020-01-07 ENCOUNTER — Other Ambulatory Visit: Payer: Self-pay | Admitting: *Deleted

## 2020-01-07 ENCOUNTER — Encounter: Payer: Self-pay | Admitting: Internal Medicine

## 2020-01-07 ENCOUNTER — Non-Acute Institutional Stay (SKILLED_NURSING_FACILITY): Payer: Medicare Other | Admitting: Internal Medicine

## 2020-01-07 DIAGNOSIS — R600 Localized edema: Secondary | ICD-10-CM

## 2020-01-07 DIAGNOSIS — G309 Alzheimer's disease, unspecified: Secondary | ICD-10-CM

## 2020-01-07 DIAGNOSIS — F0150 Vascular dementia without behavioral disturbance: Secondary | ICD-10-CM | POA: Diagnosis not present

## 2020-01-07 DIAGNOSIS — S82242S Displaced spiral fracture of shaft of left tibia, sequela: Secondary | ICD-10-CM

## 2020-01-07 DIAGNOSIS — I10 Essential (primary) hypertension: Secondary | ICD-10-CM

## 2020-01-07 DIAGNOSIS — R112 Nausea with vomiting, unspecified: Secondary | ICD-10-CM

## 2020-01-07 DIAGNOSIS — F0280 Dementia in other diseases classified elsewhere without behavioral disturbance: Secondary | ICD-10-CM | POA: Diagnosis not present

## 2020-01-07 LAB — CBC: RBC: 2.98 — AB (ref 3.87–5.11)

## 2020-01-07 LAB — CBC AND DIFFERENTIAL
HCT: 27 — AB (ref 36–46)
Hemoglobin: 9 — AB (ref 12.0–16.0)
Neutrophils Absolute: 4856
Platelets: 318 (ref 150–399)
WBC: 8.3

## 2020-01-07 LAB — BASIC METABOLIC PANEL
BUN: 23 — AB (ref 4–21)
CO2: 27 — AB (ref 13–22)
Chloride: 103 (ref 99–108)
Creatinine: 1.1 (ref 0.5–1.1)
Glucose: 97
Potassium: 3.9 (ref 3.4–5.3)
Sodium: 139 (ref 137–147)

## 2020-01-07 LAB — HEPATIC FUNCTION PANEL
ALT: 6 — AB (ref 7–35)
AST: 11 — AB (ref 13–35)
Alkaline Phosphatase: 76 (ref 25–125)
Bilirubin, Total: 0.5

## 2020-01-07 MED ORDER — TRAMADOL HCL 50 MG PO TABS: 25.0000 mg | ORAL_TABLET | Freq: Four times a day (QID) | ORAL | 0 refills | Status: AC | PRN

## 2020-01-07 NOTE — Progress Notes (Signed)
Location: [facility] Room Number: 66 Place of Service:  SNF (31)  Provider:   Code Status: DNR Goals of Care:  Advanced Directives 01/03/2020  Does Patient Have a Medical Advance Directive? Yes  Type of Advance Directive Out of facility DNR (pink MOST or yellow form);Living will  Does patient want to make changes to medical advance directive? No - Patient declined  Copy of [place] in Chart? -  Would patient like information on creating a medical advance directive? -  Pre-existing out of facility DNR order (yellow form or pink MOST form) Yellow form placed in chart (order not valid for inpatient use)     Chief Complaint  Patient presents with  . Acute Visit    Nausea vomiting    HPI: Patient is a 86 y.o. female seen today for an acute visit for Nausea and Vomiting Was admitted in the Hospital from 5/25-5/28 for Fibula Tibia Fractures s/p IM rod Placement  Patient has h/o Hypertension, Hypothyroidism, Alzheimer's Dementia with behavior issues, Depression, B12 def, S/P Right Hip fracture with Intramedullary Fixation in 8/27  Patient was seen today after she had 2 episodes of vomiting per nurses patient was trying to have a bowel movement.  And after that said she does not feel good and threw up twice.  This happened after she had lunch.  She is also complaining of abdominal pain. No fever chills no coughing    Past Medical History:  Diagnosis Date  . Alzheimer disease ([place]) 10/27/2016  . Arthritis   . Confusion 04/04/2019  . Depression, recurrent ([place]) 10/27/2016  . History of fracture of right hip 04/04/2019   07/01/19 Ortho, R hip incision healed, X-ray looks good, s/p IMN R hip fx, WBAT, f/u 6 mos.   Marland Kitchen HTN (hypertension) 10/27/2016  . Hypertension   . Hypertensive kidney disease with CKD (chronic kidney disease) stage V ([place]) 11/02/2016   11/02/16 Na 136, K 4.7, Bun 30, creat 1.52, TSH 0.86, wbc 6.7, Hgb 11.3, plt 264  .  Hypothyroidism 10/27/2016  . Osteoarthritis 10/27/2016  . Thyroid disease     Past Surgical History:  Procedure Laterality Date  . ABDOMINAL HYSTERECTOMY  1986  . INTRAMEDULLARY (IM) NAIL INTERTROCHANTERIC Right 04/04/2019   Procedure: INTRAMEDULLARY (IM) NAIL INTERTROCHANTRIC;  Surgeon: Rod Can, MD;  Location: [facility];  Service: Orthopedics;  Laterality: Right;  . KNEE ARTHROSCOPY  2015/2017   Dr. Gustavus Bryant  . SPINE SURGERY  2009, 2011   Dr.Mark Nicoletta Dress  . TIBIA IM NAIL INSERTION Right 01/01/2020   Procedure: INTRAMEDULLARY (IM) NAIL TIBIAL;  Surgeon: Shona Needles, MD;  Location: [facility];  Service: Orthopedics;  Laterality: Right;    No Known Allergies  Outpatient Encounter Medications as of 01/07/2020  Medication Sig  . acetaminophen (TYLENOL) 325 MG tablet Take 650 mg by mouth 2 (two) times daily.  Marland Kitchen ascorbic acid (VITAMIN C) 500 MG tablet Take 500 mg by mouth daily.  Marland Kitchen aspirin EC 325 MG tablet Take 1 tablet (325 mg total) by mouth in the morning and at bedtime.  . Calcium Carbonate (CALCIUM 600 PO) Take 1 tablet by mouth daily.  . cholecalciferol (VITAMIN D3) 25 MCG (1000 UT) tablet Take 2,000 Units by mouth daily.   . divalproex (DEPAKOTE) 250 MG DR tablet Take 250 mg by mouth in the morning and at bedtime.  . docusate sodium (COLACE) 100 MG capsule Take 1 capsule (100 mg total) by mouth 2 (two) times daily.  Marland Kitchen  DULoxetine (CYMBALTA) 30 MG capsule Take 30 mg by mouth daily.  . ferrous sulfate 325 (65 FE) MG tablet Take 325 mg by mouth daily with breakfast.  . levothyroxine (SYNTHROID, LEVOTHROID) 25 MCG tablet Take 25 mcg by mouth daily before breakfast.  . lisinopril (ZESTRIL) 10 MG tablet Take 10 mg by mouth daily.  Marland Kitchen LORazepam (ATIVAN) 0.5 MG tablet Take 0.5 mg by mouth at bedtime.  . memantine (NAMENDA) 10 MG tablet Take 10 mg by mouth 2 (two) times daily.  . ondansetron (ZOFRAN-ODT) 4 MG disintegrating tablet Take 4 mg by mouth once.  Marland Kitchen QUEtiapine (SEROQUEL) 25 MG tablet Take  25 mg by mouth at bedtime.  . traMADol (ULTRAM) 50 MG tablet Take 0.5 tablets (25 mg total) by mouth every 6 (six) hours as needed.  . traZODone (DESYREL) 50 MG tablet Take 50 mg by mouth at bedtime.  . vitamin B-12 (CYANOCOBALAMIN) 1000 MCG tablet Take 1,000 mcg by mouth. Once a day on Monday and Wednesday  . [DISCONTINUED] traMADol (ULTRAM) 50 MG tablet Take by mouth every 6 (six) hours as needed for moderate pain.   No facility-administered encounter medications on file as of 01/07/2020.    Review of Systems:  Review of Systems  Constitutional: Positive for appetite change.  HENT: Negative.   Respiratory: Negative.   Cardiovascular: Positive for leg swelling.  Gastrointestinal: Positive for abdominal pain, constipation, nausea and vomiting.  Genitourinary: Negative.   Musculoskeletal: Positive for arthralgias, gait problem and myalgias.  Neurological: Positive for weakness.  Psychiatric/Behavioral: Positive for agitation, behavioral problems, confusion and dysphoric mood.    Health Maintenance  Topic Date Due  . TETANUS/TDAP  06/22/2027  . DEXA SCAN  Completed  . COVID-19 Vaccine  Completed  . PNA vac Low Risk Adult  Completed  . INFLUENZA VACCINE  Discontinued    Physical Exam: Vitals:   01/07/20 1632  BP: 110/74  Pulse: 68  Resp: 15  Temp: (!) 97.5 F (36.4 C)  SpO2: 96%  Weight: 154 lb 3.2 oz (69.9 kg)  Height: 4\' 11"  (1.499 m)   Body mass index is 31.14 kg/m. Physical Exam Vitals reviewed.  Constitutional:      Appearance: Normal appearance.  HENT:     Head: Normocephalic.     Nose: Nose normal.     Mouth/Throat:     Mouth: Mucous membranes are dry.  Eyes:     Pupils: Pupils are equal, round, and reactive to light.  Cardiovascular:     Rate and Rhythm: Regular rhythm. Tachycardia present.     Pulses: Normal pulses.     Heart sounds: Normal heart sounds.  Pulmonary:     Effort: Pulmonary effort is normal. No respiratory distress.     Breath sounds:  Normal breath sounds. No wheezing.  Abdominal:     Palpations: Abdomen is soft.     Comments: C/O Pain generalized Bowel Sounds Decreased  Musculoskeletal:        General: Swelling present.     Comments: Bilateral Swelling  Skin:    General: Skin is warm.  Neurological:     General: No focal deficit present.     Mental Status: She is alert.  Psychiatric:        Mood and Affect: Mood normal.        Thought Content: Thought content normal.     Labs reviewed: Basic Metabolic Panel: Recent Labs    04/11/19 0000 04/25/19 0000 12/31/19 1900 12/31/19 1900 01/02/20 0923 01/03/20 0615 01/07/20 0000  NA 140   < > 140   < > 140 140 139  K 4.2   < > 4.5   < > 3.7 3.8 3.9  CL  --    < > 104   < > 104 106 103  CO2  --    < > 24   < > 25 29 27*  GLUCOSE  --   --  112*  --  139* 101*  --   BUN 10   < > 15   < > 16 17 23*  CREATININE 0.8   < > 0.94   < > 1.17* 1.04* 1.1  CALCIUM  --    < > 10.4*  --  9.0 9.4  --   TSH 2.43  --   --   --   --   --   --    < > = values in this interval not displayed.   Liver Function Tests: Recent Labs    07/30/19 0000 07/30/19 0000 11/07/19 0000 01/02/20 0923 01/07/20 0000  AST 11*   < > 10* 18 11*  ALT 8   < > 7 10 6*  ALKPHOS 117   < > 92 59 76  BILITOT  --   --   --  0.3  --   PROT  --   --   --  5.9*  --   ALBUMIN 3.8  --   --  2.9*  --    < > = values in this interval not displayed.   No results for input(s): LIPASE, AMYLASE in the last 8760 hours. No results for input(s): AMMONIA in the last 8760 hours. CBC: Recent Labs    04/07/19 0324 07/30/19 0000 09/03/19 0000 11/07/19 0000 12/31/19 1900 12/31/19 1900 01/02/20 0923 01/03/20 0615 01/07/20 0000  WBC   < > 6.1   < > 5.3 10.1   < > 9.9 8.5 8.3  NEUTROABS  --  3,630  --  2,417  --   --   --   --  4,856  HGB   < > 12.7   < > 11.8* 12.5   < > 8.9* 8.9* 9.0*  HCT   < > 39   < > 36 40.1   < > 28.4* 28.1* 27*  MCV   < >  --   --   --  96.2  --  96.9 96.9  --   PLT   < > 232    < > 221 217   < > 200 177 318   < > = values in this interval not displayed.   Lipid Panel: No results for input(s): CHOL, HDL, LDLCALC, TRIG, CHOLHDL, LDLDIRECT in the last 8760 hours. Lab Results  Component Value Date   HGBA1C 5.3 11/01/2016    Procedures since last visit: DG Tibia/Fibula Right  Result Date: 01/01/2020 CLINICAL DATA:  Right tibia and fibula fractures. EXAM: RIGHT TIBIA AND FIBULA - 2 VIEW; DG C-ARM 1-60 MIN COMPARISON:  12/31/2019 FINDINGS: Nine C-arm views of the right lower leg demonstrate intramedullary rod and screw fixation of the previously demonstrated distal tibia fracture with essentially anatomic position and alignment. The previously described fibular shaft fracture is again noted. Stable knee prosthesis. IMPRESSION: Hardware fixation of the previously demonstrated distal tibia fracture. Electronically Signed   By: Claudie Revering M.D.   On: 01/01/2020 13:59   DG Ankle Complete Right  Result Date: 12/31/2019 CLINICAL DATA:  [place].  Tib fib deformity. EXAM:  RIGHT ANKLE - COMPLETE 3+ VIEW COMPARISON:  None. FINDINGS: Displaced spiral type fracture of the mid distal tibial shaft and the mid fibular shaft. There is 1 shaft width of lateral and anterior displacement of the tibial fracture with a moderate-sized butterfly fragment. The ankle and hindfoot bony structures are intact. IMPRESSION: Displaced spiral type fractures of the mid distal tibial shaft and the mid fibula. Electronically Signed   By: Marijo Sanes M.D.   On: 12/31/2019 18:51   DG Knee Complete 4 Views Right  Result Date: 12/31/2019 CLINICAL DATA:  Knee pain after fall EXAM: RIGHT KNEE - COMPLETE 4+ VIEW COMPARISON:  None. FINDINGS: Normal appearance of right total knee arthroplasty. There is a laterally displaced oblique fracture of the midshaft of the right fibula. No knee effusion. IMPRESSION: Laterally displaced oblique fracture of the midshaft of the right fibula. Right total knee arthroplasty without  adverse features. Electronically Signed   By: Ulyses Jarred M.D.   On: 12/31/2019 19:39   DG Tibia/Fibula Right Port  Result Date: 01/01/2020 CLINICAL DATA:  Status post right tibial fixation. EXAM: PORTABLE RIGHT TIBIA AND FIBULA - 2 VIEW COMPARISON:  Dec 31, 2019. FINDINGS: Status post intramedullary rod fixation of distal right tibial fracture. Good alignment of fracture components is noted. Stable appearance of mid fibular shaft fracture. IMPRESSION: Status post intramedullary rod fixation of distal right tibial fracture. Electronically Signed   By: Marijo Conception M.D.   On: 01/01/2020 16:08   DG Toe Great Right  Result Date: 12/31/2019 CLINICAL DATA:  Fall with toe pain EXAM: RIGHT GREAT TOE COMPARISON:  None. FINDINGS: There is no evidence of fracture or dislocation. There is no evidence of arthropathy or other focal bone abnormality. Soft tissues are unremarkable. IMPRESSION: Negative. Electronically Signed   By: Ulyses Jarred M.D.   On: 12/31/2019 19:38   DG C-Arm 1-60 Min  Result Date: 01/01/2020 CLINICAL DATA:  Right tibia and fibula fractures. EXAM: RIGHT TIBIA AND FIBULA - 2 VIEW; DG C-ARM 1-60 MIN COMPARISON:  12/31/2019 FINDINGS: Nine C-arm views of the right lower leg demonstrate intramedullary rod and screw fixation of the previously demonstrated distal tibia fracture with essentially anatomic position and alignment. The previously described fibular shaft fracture is again noted. Stable knee prosthesis. IMPRESSION: Hardware fixation of the previously demonstrated distal tibia fracture. Electronically Signed   By: Claudie Revering M.D.   On: 01/01/2020 13:59    Assessment/Plan Nausea vomiting with abdominal pain Possible constipation Will discontinue iron for now.  Can be started later Also decrease the dose of tramadol to 25 mg every 6 hours Will get stat labs CBC and BMP with hepatic panel and lipase Also get abdominal x-ray to rule out obstruction. 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

## 2020-01-07 NOTE — Telephone Encounter (Signed)
Received fax from [facility] requesting Refill for Lorazepam 1mg  at bedtime.  Pended Rx and sent to Dr. Lyndel Safe for approval.

## 2020-01-08 ENCOUNTER — Emergency Department (HOSPITAL_COMMUNITY): Payer: Medicare Other

## 2020-01-08 ENCOUNTER — Inpatient Hospital Stay (HOSPITAL_COMMUNITY)
Admission: EM | Admit: 2020-01-08 | Discharge: 2020-01-12 | DRG: 872 | Disposition: A | Discharge status: Skilled Nursing Facility | Payer: Medicare Other | Source: Skilled Nursing Facility | Attending: Internal Medicine | Admitting: Internal Medicine

## 2020-01-08 ENCOUNTER — Other Ambulatory Visit: Payer: Self-pay

## 2020-01-08 ENCOUNTER — Encounter (HOSPITAL_COMMUNITY): Payer: Self-pay

## 2020-01-08 ENCOUNTER — Inpatient Hospital Stay (HOSPITAL_COMMUNITY): Payer: Medicare Other

## 2020-01-08 ENCOUNTER — Non-Acute Institutional Stay (SKILLED_NURSING_FACILITY): Payer: Medicare Other | Admitting: Nurse Practitioner

## 2020-01-08 ENCOUNTER — Encounter: Payer: Self-pay | Admitting: Nurse Practitioner

## 2020-01-08 DIAGNOSIS — Z7982 Long term (current) use of aspirin: Secondary | ICD-10-CM

## 2020-01-08 DIAGNOSIS — R296 Repeated falls: Secondary | ICD-10-CM | POA: Diagnosis present

## 2020-01-08 DIAGNOSIS — D500 Iron deficiency anemia secondary to blood loss (chronic): Secondary | ICD-10-CM | POA: Diagnosis not present

## 2020-01-08 DIAGNOSIS — D631 Anemia in chronic kidney disease: Secondary | ICD-10-CM | POA: Diagnosis present

## 2020-01-08 DIAGNOSIS — Z79899 Other long term (current) drug therapy: Secondary | ICD-10-CM | POA: Diagnosis not present

## 2020-01-08 DIAGNOSIS — Z5329 Procedure and treatment not carried out because of patient's decision for other reasons: Secondary | ICD-10-CM | POA: Diagnosis not present

## 2020-01-08 DIAGNOSIS — J9811 Atelectasis: Secondary | ICD-10-CM | POA: Diagnosis present

## 2020-01-08 DIAGNOSIS — R509 Fever, unspecified: Secondary | ICD-10-CM | POA: Diagnosis not present

## 2020-01-08 DIAGNOSIS — D72825 Bandemia: Secondary | ICD-10-CM | POA: Diagnosis not present

## 2020-01-08 DIAGNOSIS — E039 Hypothyroidism, unspecified: Secondary | ICD-10-CM | POA: Diagnosis present

## 2020-01-08 DIAGNOSIS — R1084 Generalized abdominal pain: Secondary | ICD-10-CM | POA: Diagnosis not present

## 2020-01-08 DIAGNOSIS — R609 Edema, unspecified: Secondary | ICD-10-CM | POA: Diagnosis not present

## 2020-01-08 DIAGNOSIS — G4700 Insomnia, unspecified: Secondary | ICD-10-CM | POA: Diagnosis present

## 2020-01-08 DIAGNOSIS — N1831 Chronic kidney disease, stage 3a: Secondary | ICD-10-CM

## 2020-01-08 DIAGNOSIS — F0280 Dementia in other diseases classified elsewhere without behavioral disturbance: Secondary | ICD-10-CM | POA: Diagnosis not present

## 2020-01-08 DIAGNOSIS — S82161D Torus fracture of upper end of right tibia, subsequent encounter for fracture with routine healing: Secondary | ICD-10-CM

## 2020-01-08 DIAGNOSIS — R109 Unspecified abdominal pain: Secondary | ICD-10-CM | POA: Diagnosis not present

## 2020-01-08 DIAGNOSIS — G309 Alzheimer's disease, unspecified: Secondary | ICD-10-CM

## 2020-01-08 DIAGNOSIS — R0689 Other abnormalities of breathing: Secondary | ICD-10-CM | POA: Diagnosis not present

## 2020-01-08 DIAGNOSIS — S82201D Unspecified fracture of shaft of right tibia, subsequent encounter for closed fracture with routine healing: Secondary | ICD-10-CM | POA: Diagnosis not present

## 2020-01-08 DIAGNOSIS — I4510 Unspecified right bundle-branch block: Secondary | ICD-10-CM | POA: Diagnosis present

## 2020-01-08 DIAGNOSIS — Z66 Do not resuscitate: Secondary | ICD-10-CM | POA: Diagnosis present

## 2020-01-08 DIAGNOSIS — I10 Essential (primary) hypertension: Secondary | ICD-10-CM

## 2020-01-08 DIAGNOSIS — W19XXXD Unspecified fall, subsequent encounter: Secondary | ICD-10-CM | POA: Diagnosis present

## 2020-01-08 DIAGNOSIS — A419 Sepsis, unspecified organism: Secondary | ICD-10-CM | POA: Diagnosis not present

## 2020-01-08 DIAGNOSIS — Z515 Encounter for palliative care: Secondary | ICD-10-CM | POA: Diagnosis not present

## 2020-01-08 DIAGNOSIS — R52 Pain, unspecified: Secondary | ICD-10-CM | POA: Diagnosis not present

## 2020-01-08 DIAGNOSIS — Z20822 Contact with and (suspected) exposure to covid-19: Secondary | ICD-10-CM | POA: Diagnosis present

## 2020-01-08 DIAGNOSIS — Z79890 Hormone replacement therapy (postmenopausal): Secondary | ICD-10-CM | POA: Diagnosis not present

## 2020-01-08 DIAGNOSIS — I129 Hypertensive chronic kidney disease with stage 1 through stage 4 chronic kidney disease, or unspecified chronic kidney disease: Secondary | ICD-10-CM | POA: Diagnosis present

## 2020-01-08 DIAGNOSIS — F339 Major depressive disorder, recurrent, unspecified: Secondary | ICD-10-CM | POA: Diagnosis not present

## 2020-01-08 DIAGNOSIS — R4182 Altered mental status, unspecified: Secondary | ICD-10-CM | POA: Diagnosis not present

## 2020-01-08 DIAGNOSIS — F0390 Unspecified dementia without behavioral disturbance: Secondary | ICD-10-CM | POA: Diagnosis not present

## 2020-01-08 DIAGNOSIS — R0902 Hypoxemia: Secondary | ICD-10-CM | POA: Diagnosis not present

## 2020-01-08 DIAGNOSIS — R451 Restlessness and agitation: Secondary | ICD-10-CM | POA: Diagnosis present

## 2020-01-08 DIAGNOSIS — S82301D Unspecified fracture of lower end of right tibia, subsequent encounter for closed fracture with routine healing: Secondary | ICD-10-CM | POA: Diagnosis not present

## 2020-01-08 DIAGNOSIS — F0150 Vascular dementia without behavioral disturbance: Secondary | ICD-10-CM

## 2020-01-08 DIAGNOSIS — N200 Calculus of kidney: Secondary | ICD-10-CM | POA: Diagnosis not present

## 2020-01-08 DIAGNOSIS — Z7189 Other specified counseling: Secondary | ICD-10-CM | POA: Diagnosis not present

## 2020-01-08 DIAGNOSIS — K529 Noninfective gastroenteritis and colitis, unspecified: Secondary | ICD-10-CM | POA: Diagnosis not present

## 2020-01-08 DIAGNOSIS — S82401D Unspecified fracture of shaft of right fibula, subsequent encounter for closed fracture with routine healing: Secondary | ICD-10-CM

## 2020-01-08 DIAGNOSIS — F329 Major depressive disorder, single episode, unspecified: Secondary | ICD-10-CM | POA: Diagnosis present

## 2020-01-08 DIAGNOSIS — Z90710 Acquired absence of both cervix and uterus: Secondary | ICD-10-CM

## 2020-01-08 LAB — CBC WITH DIFFERENTIAL/PLATELET
Abs Immature Granulocytes: 0.18 10*3/uL — ABNORMAL HIGH (ref 0.00–0.07)
Basophils Absolute: 0.1 10*3/uL (ref 0.0–0.1)
Basophils Relative: 0 %
Eosinophils Absolute: 0 10*3/uL (ref 0.0–0.5)
Eosinophils Relative: 0 %
HCT: 27.4 % — ABNORMAL LOW (ref 36.0–46.0)
Hemoglobin: 8.4 g/dL — ABNORMAL LOW (ref 12.0–15.0)
Immature Granulocytes: 1 %
Lymphocytes Relative: 8 %
Lymphs Abs: 1.2 10*3/uL (ref 0.7–4.0)
MCH: 29.9 pg (ref 26.0–34.0)
MCHC: 30.7 g/dL (ref 30.0–36.0)
MCV: 97.5 fL (ref 80.0–100.0)
Monocytes Absolute: 2.1 10*3/uL — ABNORMAL HIGH (ref 0.1–1.0)
Monocytes Relative: 14 %
Neutro Abs: 12.2 10*3/uL — ABNORMAL HIGH (ref 1.7–7.7)
Neutrophils Relative %: 77 %
Platelets: 260 10*3/uL (ref 150–400)
RBC: 2.81 MIL/uL — ABNORMAL LOW (ref 3.87–5.11)
RDW: 14.8 % (ref 11.5–15.5)
WBC: 15.8 10*3/uL — ABNORMAL HIGH (ref 4.0–10.5)
nRBC: 0 % (ref 0.0–0.2)

## 2020-01-08 LAB — CBC
HCT: 25.9 % — ABNORMAL LOW (ref 36.0–46.0)
Hemoglobin: 8 g/dL — ABNORMAL LOW (ref 12.0–15.0)
MCH: 29.6 pg (ref 26.0–34.0)
MCHC: 30.9 g/dL (ref 30.0–36.0)
MCV: 95.9 fL (ref 80.0–100.0)
Platelets: 144 10*3/uL — ABNORMAL LOW (ref 150–400)
RBC: 2.7 MIL/uL — ABNORMAL LOW (ref 3.87–5.11)
RDW: 15 % (ref 11.5–15.5)
WBC: 14.8 10*3/uL — ABNORMAL HIGH (ref 4.0–10.5)
nRBC: 0 % (ref 0.0–0.2)

## 2020-01-08 LAB — COMPREHENSIVE METABOLIC PANEL
ALT: 10 U/L (ref 0–44)
AST: 14 U/L — ABNORMAL LOW (ref 15–41)
Albumin: 2.6 g/dL — ABNORMAL LOW (ref 3.5–5.0)
Alkaline Phosphatase: 58 U/L (ref 38–126)
Anion gap: 10 (ref 5–15)
BUN: 33 mg/dL — ABNORMAL HIGH (ref 8–23)
CO2: 23 mmol/L (ref 22–32)
Calcium: 9.2 mg/dL (ref 8.9–10.3)
Chloride: 107 mmol/L (ref 98–111)
Creatinine, Ser: 1.3 mg/dL — ABNORMAL HIGH (ref 0.44–1.00)
GFR calc Af Amer: 43 mL/min — ABNORMAL LOW (ref >60)
GFR calc non Af Amer: 37 mL/min — ABNORMAL LOW (ref >60)
Glucose, Bld: 125 mg/dL — ABNORMAL HIGH (ref 70–99)
Potassium: 4.4 mmol/L (ref 3.5–5.1)
Sodium: 140 mmol/L (ref 135–145)
Total Bilirubin: 0.7 mg/dL (ref 0.3–1.2)
Total Protein: 5.9 g/dL — ABNORMAL LOW (ref 6.5–8.1)

## 2020-01-08 LAB — URINALYSIS, ROUTINE W REFLEX MICROSCOPIC
Bilirubin Urine: NEGATIVE
Glucose, UA: NEGATIVE mg/dL
Hgb urine dipstick: NEGATIVE
Ketones, ur: NEGATIVE mg/dL
Nitrite: NEGATIVE
Protein, ur: NEGATIVE mg/dL
Specific Gravity, Urine: 1.028 (ref 1.005–1.030)
pH: 5 (ref 5.0–8.0)

## 2020-01-08 LAB — LACTIC ACID, PLASMA
Lactic Acid, Venous: 1.6 mmol/L (ref 0.5–1.9)
Lactic Acid, Venous: 1.4 mmol/L (ref 0.5–1.9)
Lactic Acid, Venous: 1.9 mmol/L (ref 0.5–1.9)

## 2020-01-08 LAB — CREATININE, SERUM
Creatinine, Ser: 1.14 mg/dL — ABNORMAL HIGH (ref 0.44–1.00)
GFR calc Af Amer: 50 mL/min — ABNORMAL LOW (ref >60)
GFR calc non Af Amer: 44 mL/min — ABNORMAL LOW (ref >60)

## 2020-01-08 LAB — SARS CORONAVIRUS 2 BY RT PCR (HOSPITAL ORDER, PERFORMED IN ~~LOC~~ HOSPITAL LAB): SARS Coronavirus 2: NEGATIVE

## 2020-01-08 LAB — PROTIME-INR
INR: 1.2 (ref 0.8–1.2)
Prothrombin Time: 14.2 seconds (ref 11.4–15.2)

## 2020-01-08 LAB — PROCALCITONIN: Procalcitonin: 33.29 ng/mL

## 2020-01-08 LAB — APTT: aPTT: 34 seconds (ref 24–36)

## 2020-01-08 MED ORDER — METRONIDAZOLE IN NACL 5-0.79 MG/ML-% IV SOLN
500.0000 mg | Freq: Three times a day (TID) | INTRAVENOUS | Status: DC
  Administered 2020-01-08 – 2020-01-11 (×9): 500 mg via INTRAVENOUS
  Filled 2020-01-08 (×9): qty 100

## 2020-01-08 MED ORDER — ENOXAPARIN SODIUM 30 MG/0.3ML ~~LOC~~ SOLN
30.0000 mg | SUBCUTANEOUS | Status: DC
  Administered 2020-01-08: 30 mg via SUBCUTANEOUS
  Filled 2020-01-08: qty 0.3

## 2020-01-08 MED ORDER — LACTATED RINGERS IV BOLUS (SEPSIS)
1000.0000 mL | Freq: Once | INTRAVENOUS | Status: AC
  Administered 2020-01-08: 1000 mL via INTRAVENOUS

## 2020-01-08 MED ORDER — SODIUM CHLORIDE (PF) 0.9 % IJ SOLN
INTRAMUSCULAR | Status: AC
  Filled 2020-01-08: qty 50

## 2020-01-08 MED ORDER — LEVOTHYROXINE SODIUM 25 MCG PO TABS
25.0000 ug | ORAL_TABLET | Freq: Every day | ORAL | Status: DC
  Administered 2020-01-10 – 2020-01-12 (×3): 25 ug via ORAL
  Filled 2020-01-08 (×3): qty 1

## 2020-01-08 MED ORDER — LACTATED RINGERS IV BOLUS (SEPSIS): 250.0000 mL | Freq: Once | INTRAVENOUS | Status: DC

## 2020-01-08 MED ORDER — CIPROFLOXACIN IN D5W 400 MG/200ML IV SOLN: 400.0000 mg | INTRAVENOUS | Status: DC

## 2020-01-08 MED ORDER — ONDANSETRON 4 MG PO TBDP: 4.0000 mg | ORAL_TABLET | Freq: Three times a day (TID) | ORAL | Status: DC | PRN

## 2020-01-08 MED ORDER — IOHEXOL 300 MG/ML  SOLN
75.0000 mL | Freq: Once | INTRAMUSCULAR | Status: AC | PRN
  Administered 2020-01-08: 75 mL via INTRAVENOUS

## 2020-01-08 MED ORDER — QUETIAPINE FUMARATE 25 MG PO TABS
25.0000 mg | ORAL_TABLET | Freq: Every day | ORAL | Status: DC
  Filled 2020-01-08: qty 1

## 2020-01-08 MED ORDER — VANCOMYCIN HCL IN DEXTROSE 1-5 GM/200ML-% IV SOLN
1000.0000 mg | Freq: Once | INTRAVENOUS | Status: AC
  Administered 2020-01-08: 1000 mg via INTRAVENOUS
  Filled 2020-01-08: qty 200

## 2020-01-08 MED ORDER — MEMANTINE HCL 10 MG PO TABS
10.0000 mg | ORAL_TABLET | Freq: Two times a day (BID) | ORAL | Status: DC
  Administered 2020-01-09 – 2020-01-12 (×7): 10 mg via ORAL
  Filled 2020-01-08 (×9): qty 1

## 2020-01-08 MED ORDER — LORAZEPAM 0.5 MG PO TABS
0.5000 mg | ORAL_TABLET | Freq: Every day | ORAL | Status: DC
  Administered 2020-01-09 – 2020-01-11 (×3): 0.5 mg via ORAL
  Filled 2020-01-08 (×4): qty 1

## 2020-01-08 MED ORDER — TRAMADOL HCL 50 MG PO TABS
25.0000 mg | ORAL_TABLET | Freq: Four times a day (QID) | ORAL | Status: DC | PRN
  Administered 2020-01-10: 25 mg via ORAL
  Filled 2020-01-08 (×2): qty 1

## 2020-01-08 MED ORDER — SODIUM CHLORIDE 0.9 % IV SOLN: INTRAVENOUS | Status: DC

## 2020-01-08 MED ORDER — DIVALPROEX SODIUM 250 MG PO DR TAB
250.0000 mg | DELAYED_RELEASE_TABLET | Freq: Two times a day (BID) | ORAL | Status: DC
  Administered 2020-01-09 – 2020-01-12 (×7): 250 mg via ORAL
  Filled 2020-01-08 (×8): qty 1

## 2020-01-08 MED ORDER — SODIUM CHLORIDE 0.9 % IV SOLN
2.0000 g | Freq: Once | INTRAVENOUS | Status: AC
  Administered 2020-01-08: 2 g via INTRAVENOUS
  Filled 2020-01-08: qty 2

## 2020-01-08 MED ORDER — DULOXETINE HCL 30 MG PO CPEP
30.0000 mg | ORAL_CAPSULE | Freq: Every day | ORAL | Status: DC
  Administered 2020-01-09 – 2020-01-12 (×4): 30 mg via ORAL
  Filled 2020-01-08 (×4): qty 1

## 2020-01-08 MED ORDER — METRONIDAZOLE IN NACL 5-0.79 MG/ML-% IV SOLN
500.0000 mg | Freq: Once | INTRAVENOUS | Status: AC
  Administered 2020-01-08: 500 mg via INTRAVENOUS
  Filled 2020-01-08: qty 100

## 2020-01-08 MED ORDER — TRAZODONE HCL 50 MG PO TABS
50.0000 mg | ORAL_TABLET | Freq: Every day | ORAL | Status: DC
  Administered 2020-01-09 – 2020-01-11 (×3): 50 mg via ORAL
  Filled 2020-01-08 (×4): qty 1

## 2020-01-08 NOTE — Progress Notes (Signed)
PHARMACY NOTE:  ANTIMICROBIAL RENAL DOSAGE ADJUSTMENT  Current antimicrobial regimen includes a mismatch between antimicrobial dosage and estimated renal function.  As per policy approved by the Pharmacy & Therapeutics and Medical Executive Committees, the antimicrobial dosage will be adjusted accordingly.  Current antimicrobial dosage:  Cipro 400mg  IV q12h Indication: colitis  Renal Function:  Estimated Creatinine Clearance: 26.4 mL/min (A) (by C-G formula based on SCr of 1.3 mg/dL (H)). []      On intermittent HD, scheduled: []      On CRRT    Antimicrobial dosage has been changed to:  Cipro 400mg  IV q24h  Additional comments:   Thank you for allowing pharmacy to be a part of this patient's care.  Netta Cedars, [facility] 01/08/2020 6:10 PM

## 2020-01-08 NOTE — Assessment & Plan Note (Addendum)
Low Bp measurement, didn't take  Lisinopril today.

## 2020-01-08 NOTE — Assessment & Plan Note (Signed)
Trended up from 8.3 in am to 15.1 pm yesterday, ED eval and tx.

## 2020-01-08 NOTE — ED Triage Notes (Addendum)
Pt arrives via [facility] from [facility]. Per EMS: Pt has been more lethargic today. Pt has dementia at baseline. Pt had an episode of vomiting and abd pain this am. Pt noted to have temp of 102 oral at facility. Facility administered 1000mg  Tylenol at 1100. RA sat 87%. Pt does not use O2 at baseline.

## 2020-01-08 NOTE — H&P (Signed)
History and Physical    Haley Cohen [id number] DOB: [birth year] DOA: 01/08/2020  PCP: [name], MD Patient coming from:friends  Home 3 years Chief Complaint: Altered mental status and fever  HPI: Haley Cohen is a 86 y.o. female with medical history significant of dementia, depression hypertension, hypothyroidism admitted with fever. Patient unable to provide me with any history history is obtained from the records.  Per records patient was hypoxic with room air saturation 87%.  She had right knee surgery by Dr. Doreatha Martin recently.  And had increased white count prior to admission. Per patient's son patient had nausea vomiting and diarrhea since yesterday with fever associated with abdominal pain she was more lethargic and more sleeping than usual.  She also had leukocytosis prior to admission.  Her baseline is can she is confused and she sleeps intermittently.  Per son she had hip surgery and knee surgery last week.  ED Course: Patient was started on Vanco and cefepime.  Her vital signs where 103/47 with a temp of 101.1 pulse of 70 respiration 23-96% on 2 L.  Review of Systems: As per HPI otherwise all other systems reviewed and are negative  Ambulatory Status not much ambulatory at baseline she had recent fall with fracture of the right tibia and fibula.  Past Medical History:  Diagnosis Date  . Alzheimer disease ([place]) 10/27/2016  . Arthritis   . Confusion 04/04/2019  . Depression, recurrent ([place]) 10/27/2016  . History of fracture of right hip 04/04/2019   07/01/19 Ortho, R hip incision healed, X-ray looks good, s/p IMN R hip fx, WBAT, f/u 6 mos.   Marland Kitchen HTN (hypertension) 10/27/2016  . Hypertension   . Hypertensive kidney disease with CKD (chronic kidney disease) stage V ([place]) 11/02/2016   11/02/16 Na 136, K 4.7, Bun 30, creat 1.52, TSH 0.86, wbc 6.7, Hgb 11.3, plt 264  . Hypothyroidism 10/27/2016  . Osteoarthritis 10/27/2016  . Thyroid disease     Past Surgical History:    Procedure Laterality Date  . ABDOMINAL HYSTERECTOMY  1986  . INTRAMEDULLARY (IM) NAIL INTERTROCHANTERIC Right 04/04/2019   Procedure: INTRAMEDULLARY (IM) NAIL INTERTROCHANTRIC;  Surgeon: Rod Can, MD;  Location: [facility];  Service: Orthopedics;  Laterality: Right;  . KNEE ARTHROSCOPY  2015/2017   Dr. Gustavus Bryant  . SPINE SURGERY  2009, 2011   Dr.Mark Nicoletta Dress  . TIBIA IM NAIL INSERTION Right 01/01/2020   Procedure: INTRAMEDULLARY (IM) NAIL TIBIAL;  Surgeon: Shona Needles, MD;  Location: [facility];  Service: Orthopedics;  Laterality: Right;    Social History   Socioeconomic History  . Marital status: Single    Spouse name: Not on file  . Number of children: Not on file  . Years of education: Not on file  . Highest education level: Not on file  Occupational History  . Not on file  Tobacco Use  . Smoking status: Never Smoker  . Smokeless tobacco: Never Used  Substance and Sexual Activity  . Alcohol use: Not Currently  . Drug use: Never  . Sexual activity: Not Currently  Other Topics Concern  . Not on file  Social History Narrative   ** Merged History Encounter **       Admitted to [facility] 10/21/16 Widowed Never smoked Alcohol none POA/DNR     Social Determinants of Health   Financial Resource Strain:   . Difficulty of Paying Living Expenses:   Food Insecurity:   . Worried About Charity fundraiser in the Last Year:   .  Ran Out of Food in the Last Year:   Transportation Needs:   . Film/video editor (Medical):   Marland Kitchen Lack of Transportation (Non-Medical):   Physical Activity:   . Days of Exercise per Week:   . Minutes of Exercise per Session:   Stress:   . Feeling of Stress :   Social Connections:   . Frequency of Communication with Friends and Family:   . Frequency of Social Gatherings with Friends and Family:   . Attends Religious Services:   . Active Member of Clubs or Organizations:   . Attends Archivist Meetings:   Marland Kitchen Marital Status:    Intimate Partner Violence:   . Fear of Current or Ex-Partner:   . Emotionally Abused:   Marland Kitchen Physically Abused:   . Sexually Abused:     No Known Allergies  Family History  Problem Relation Age of Onset  . Cancer Mother     Prior to Admission medications   Medication Sig Start Date End Date Taking? Authorizing Provider  acetaminophen (TYLENOL) 325 MG tablet Take 650 mg by mouth 2 (two) times daily.   Yes [provider]  acetaminophen (TYLENOL) 650 MG suppository Place 650 mg rectally every 4 (four) hours as needed for fever.   Yes [provider]  ascorbic acid (VITAMIN C) 500 MG tablet Take 500 mg by mouth daily.   Yes [provider]  aspirin EC 325 MG tablet Take 1 tablet (325 mg total) by mouth in the morning and at bedtime. 01/02/20 02/01/20 Yes Delray Alt, PA-C  Calcium Carbonate (CALCIUM 600 PO) Take 1 tablet by mouth daily.   Yes [provider]  cholecalciferol (VITAMIN D3) 25 MCG (1000 UT) tablet Take 2,000 Units by mouth daily.    Yes [provider]  divalproex (DEPAKOTE) 250 MG DR tablet Take 250 mg by mouth in the morning and at bedtime.   Yes [provider]  docusate sodium (COLACE) 100 MG capsule Take 1 capsule (100 mg total) by mouth 2 (two) times daily. 04/05/19  Yes Donne Hazel, MD  DULoxetine (CYMBALTA) 30 MG capsule Take 30 mg by mouth daily. 04/08/19  Yes [provider]  levothyroxine (SYNTHROID, LEVOTHROID) 25 MCG tablet Take 25 mcg by mouth daily before breakfast.   Yes [provider]  lisinopril (ZESTRIL) 10 MG tablet Take 10 mg by mouth daily.   Yes [provider]  LORazepam (ATIVAN) 0.5 MG tablet Take 0.5 mg by mouth at bedtime.   Yes [provider]  memantine (NAMENDA) 10 MG tablet Take 10 mg by mouth 2 (two) times daily.   Yes [provider]  ondansetron (ZOFRAN-ODT) 4 MG disintegrating tablet Take 4 mg by mouth every 8 (eight) hours as needed for  nausea or vomiting.    Yes [provider]  QUEtiapine (SEROQUEL) 25 MG tablet Take 25 mg by mouth at bedtime.   Yes [provider]  traMADol (ULTRAM) 50 MG tablet Take 0.5 tablets (25 mg total) by mouth every 6 (six) hours as needed. Patient taking differently: Take 25 mg by mouth every 6 (six) hours as needed for moderate pain.  01/07/20  Yes [name], MD  traZODone (DESYREL) 50 MG tablet Take 50 mg by mouth at bedtime.   Yes [provider]  vitamin B-12 (CYANOCOBALAMIN) 1000 MCG tablet Take 1,000 mcg by mouth. Once a day on Monday and Wednesday   Yes [provider]    Physical Exam: Vitals:  01/08/20 1530 01/08/20 1557 01/08/20 1700 01/08/20 1730  BP: (!) 92/46 (!) 101/50 (!) 87/38 (!) 103/47  Pulse: 71 69 70 68  Resp: (!) 23 (!) 22 (!) 22 (!) 23  Temp:      TempSrc:      SpO2: 96% 97% 97% 96%     . General:  Appears calm and comfortable . Eyes: PERRL, EOMI, normal lids, iris . ENT: grossly normal hearing, lips & tongue, mmm . Neck: no LAD, masses or thyromegaly . Cardiovascular: RRR, no m/r/g. No LE edema.  Marland Kitchen Respiratory:  CTA bilaterally, no w/r/r. Normal respiratory effort. . Abdomen:  soft, nd, NABS tender  . Skin: no rash or induration seen on limited exam . Musculoskeletal: grossly normal tone BUE/BLE, good ROM, no bony abnormality . Psychiatric: grossly normal mood and affect, speech fluent and appropriate, AOx3 . Neurologic:  CN 2-12 grossly intact, moves all extremities in coordinated fashion, sensation intact  Labs on Admission: I have personally reviewed following labs and imaging studies  CBC: Recent Labs  Lab 01/02/20 0923 01/03/20 0615 01/07/20 0000 01/08/20 1448  WBC 9.9 8.5 8.3 15.8*  NEUTROABS  --   --  4,856 12.2*  HGB 8.9* 8.9* 9.0* 8.4*  HCT 28.4* 28.1* 27* 27.4*  MCV 96.9 96.9  --  97.5  PLT 200 177 318 [id number]   Basic Metabolic Panel: Recent Labs  Lab 01/02/20 0923 01/03/20 0615 01/07/20 0000  01/08/20 1448  NA 140 140 139 140  K 3.7 3.8 3.9 4.4  CL 104 106 103 107  CO2 25 29 27* 23  GLUCOSE 139* 101*  --  125*  BUN 16 17 23* 33*  CREATININE 1.17* 1.04* 1.1 1.30*  CALCIUM 9.0 9.4  --  9.2   GFR: Estimated Creatinine Clearance: 26.4 mL/min (A) (by C-G formula based on SCr of 1.3 mg/dL (H)). Liver Function Tests: Recent Labs  Lab 01/02/20 0923 01/07/20 0000 01/08/20 1448  AST 18 11* 14*  ALT 10 6* 10  ALKPHOS 59 76 58  BILITOT 0.3  --  0.7  PROT 5.9*  --  5.9*  ALBUMIN 2.9*  --  2.6*   No results for input(s): LIPASE, AMYLASE in the last 168 hours. No results for input(s): AMMONIA in the last 168 hours. Coagulation Profile: Recent Labs  Lab 01/08/20 1448  INR 1.2   Cardiac Enzymes: No results for input(s): CKTOTAL, CKMB, CKMBINDEX, TROPONINI in the last 168 hours. BNP (last 3 results) No results for input(s): PROBNP in the last 8760 hours. HbA1C: No results for input(s): HGBA1C in the last 72 hours. CBG: No results for input(s): GLUCAP in the last 168 hours. Lipid Profile: No results for input(s): CHOL, HDL, LDLCALC, TRIG, CHOLHDL, LDLDIRECT in the last 72 hours. Thyroid Function Tests: No results for input(s): TSH, T4TOTAL, FREET4, T3FREE, THYROIDAB in the last 72 hours. Anemia Panel: No results for input(s): VITAMINB12, FOLATE, FERRITIN, TIBC, IRON, RETICCTPCT in the last 72 hours. Urine analysis:    Component Value Date/Time   COLORURINE YELLOW 04/05/2019 0430   APPEARANCEUR CLEAR 04/05/2019 0430   LABSPEC 1.021 04/05/2019 0430   PHURINE 5.0 04/05/2019 0430   GLUCOSEU NEGATIVE 04/05/2019 0430   HGBUR SMALL (A) 04/05/2019 0430   BILIRUBINUR NEGATIVE 04/05/2019 0430   KETONESUR NEGATIVE 04/05/2019 0430   PROTEINUR NEGATIVE 04/05/2019 0430   NITRITE NEGATIVE 04/05/2019 0430   LEUKOCYTESUR SMALL (A) 04/05/2019 0430    Creatinine Clearance: Estimated Creatinine Clearance: 26.4 mL/min (A) (by C-G formula based on SCr of  1.3 mg/dL (H)).  Sepsis  Labs: @LABRCNTIP (procalcitonin:4,lacticidven:4) ) Recent Results (from the past 240 hour(s))  SARS Coronavirus 2 by RT PCR (hospital order, performed in [facility] hospital lab) Nasopharyngeal Nasopharyngeal Swab     Status: None   Collection Time: 12/31/19  6:52 PM   Specimen: Nasopharyngeal Swab  Result Value Ref Range Status   SARS Coronavirus 2 NEGATIVE NEGATIVE Final    Comment: (NOTE) SARS-CoV-2 target nucleic acids are NOT DETECTED. The SARS-CoV-2 RNA is generally detectable in upper and lower respiratory specimens during the acute phase of infection. The lowest concentration of SARS-CoV-2 viral copies this assay can detect is 250 copies / mL. A negative result does not preclude SARS-CoV-2 infection and should not be used as the sole basis for treatment or other patient management decisions.  A negative result may occur with improper specimen collection / handling, submission of specimen other than nasopharyngeal swab, presence of viral mutation(s) within the areas targeted by this assay, and inadequate number of viral copies (<250 copies / mL). A negative result must be combined with clinical observations, patient history, and epidemiological information. Fact Sheet for Patients:   StrictlyIdeas.no Fact Sheet for Healthcare Providers: BankingDealers.co.za This test is not yet approved or cleared  by the [place] FDA and has been authorized for detection and/or diagnosis of SARS-CoV-2 by FDA under an Emergency Use Authorization (EUA).  This EUA will remain in effect (meaning this test can be used) for the duration of the COVID-19 declaration under Section 564(b)(1) of the Act, 21 U.S.C. section 360bbb-3(b)(1), unless the authorization is terminated or revoked sooner. Performed at [facility], [place] [address] [place], [place] [address]   Surgical pcr screen     Status: None   Collection Time: 01/01/20 12:32 AM    Specimen: Nasal Mucosa; Nasal Swab  Result Value Ref Range Status   MRSA, PCR NEGATIVE NEGATIVE Final   Staphylococcus aureus NEGATIVE NEGATIVE Final    Comment: (NOTE) The Xpert SA Assay (FDA approved for NASAL specimens in patients 36 years of age and older), is one component of a comprehensive surveillance program. It is not intended to diagnose infection nor to guide or monitor treatment. Performed at [facility], [place] [address] [place], [place] [address]   Blood Culture (routine x 2)     Status: None (Preliminary result)   Collection Time: 01/08/20  2:48 PM   Specimen: BLOOD  Result Value Ref Range Status   Specimen Description   Final    BLOOD LEFT ANTECUBITAL Performed at [facility], [place] [address], [place], [place] [address]    Special Requests   Final    BOTTLES DRAWN AEROBIC AND ANAEROBIC Blood Culture adequate volume Performed at [place] [address], [place], [place] [address]    Culture PENDING  Incomplete   Report Status PENDING  Incomplete  Blood Culture (routine x 2)     Status: None (Preliminary result)   Collection Time: 01/08/20  2:48 PM   Specimen: BLOOD RIGHT FOREARM  Result Value Ref Range Status   Specimen Description   Final    BLOOD RIGHT FOREARM Performed at [place] [address], [place], [place] [address]    Special Requests   Final    BOTTLES DRAWN AEROBIC AND ANAEROBIC Blood Culture results may not be optimal due to an inadequate volume of blood received in culture bottles Performed at [place],  [place] [address]    Culture PENDING  Incomplete   Report Status PENDING  Incomplete  SARS Coronavirus 2 by RT PCR (hospital order, performed in [facility] hospital lab) Nasopharyngeal Nasopharyngeal Swab     Status: None   Collection Time: 01/08/20  3:02 PM   Specimen: Nasopharyngeal Swab  Result Value Ref Range Status   SARS  Coronavirus 2 NEGATIVE NEGATIVE Final    Comment: (NOTE) SARS-CoV-2 target nucleic acids are NOT DETECTED. The SARS-CoV-2 RNA is generally detectable in upper and lower respiratory specimens during the acute phase of infection. The lowest concentration of SARS-CoV-2 viral copies this assay can detect is 250 copies / mL. A negative result does not preclude SARS-CoV-2 infection and should not be used as the sole basis for treatment or other patient management decisions.  A negative result may occur with improper specimen collection / handling, submission of specimen other than nasopharyngeal swab, presence of viral mutation(s) within the areas targeted by this assay, and inadequate number of viral copies (<250 copies / mL). A negative result must be combined with clinical observations, patient history, and epidemiological information. Fact Sheet for Patients:   StrictlyIdeas.no Fact Sheet for Healthcare Providers: BankingDealers.co.za This test is not yet approved or cleared  by the [place] FDA and has been authorized for detection and/or diagnosis of SARS-CoV-2 by FDA under an Emergency Use Authorization (EUA).  This EUA will remain in effect (meaning this test can be used) for the duration of the COVID-19 declaration under Section 564(b)(1) of the Act, 21 U.S.C. section 360bbb-3(b)(1), unless the authorization is terminated or revoked sooner. Performed at [facility], [place] [address], [place], [place] [address]      Radiological Exams on Admission: DG Tibia/Fibula Right  Result Date: 01/08/2020 CLINICAL DATA:  Patient with fever and lethargy. Recent right tibial surgery. White count. EXAM: RIGHT TIBIA AND FIBULA - 2 VIEW COMPARISON:  Right lower extremity radiograph 01/01/2020 FINDINGS: Status post intramedullary rod and screw fixation for a fracture of the distal tibia. No significant change in fracture alignment  compared to prior. Stable appearance of a mildly displaced fracture of the mid fibula. Knee arthroplasty appears intact. Regional soft tissues unremarkable. IMPRESSION: Status post intramedullary rod and screw fixation for a fracture of the distal tibia. Stable appearance of a mid fibular fracture. No significant change in fracture alignment compared to prior. No evidence of hardware complication. Electronically Signed   By: Audie Pinto M.D.   On: 01/08/2020 16:31   CT Abdomen Pelvis W Contrast  Result Date: 01/08/2020 CLINICAL DATA:  Abdominal pain, distension, fever EXAM: CT ABDOMEN AND PELVIS WITH CONTRAST TECHNIQUE: Multidetector CT imaging of the abdomen and pelvis was performed using the standard protocol following bolus administration of intravenous contrast. CONTRAST:  55mL OMNIPAQUE IOHEXOL 300 MG/ML  SOLN COMPARISON:  None. FINDINGS: Lower chest: Elevation of the left hemidiaphragm with partially visualized consolidation in the posterior left lung base which may reflect atelectasis versus pneumonia. Bandlike atelectasis within the right lung base. Heart size is upper limits of normal. Hepatobiliary: No focal liver abnormality is seen. No gallstones, gallbladder wall thickening, or biliary dilatation. Pancreas: Unremarkable. No pancreatic ductal dilatation or surrounding inflammatory changes. Spleen: Normal in size without focal abnormality. Adrenals/Urinary Tract: Adrenal glands within normal limits. Simple 2.6 cm upper pole right renal cyst. Two renal stones within the inferior right renal calyx, each measuring approximately 9 mm. Single 4 mm stone within the inferior calyx of the left kidney. There is no hydronephrosis.  Ureters and urinary bladder appear unremarkable. Stomach/Bowel: Long segment wall thickening involving the mid to distal aspect of the transverse colon to the distal descending colon with hazy pericolonic fat stranding. There are a few prominent diverticula notably at the mid  transverse colon. Remainder of the bowel appears within normal limits. A normal appendix is located in the right lower quadrant. No dilated loops of bowel to suggest obstruction. Stomach is within normal limits. Normal positioning of the GE junction. Vascular/Lymphatic: Scattered aortoiliac atherosclerotic calcification without aneurysm. No abdominopelvic lymphadenopathy. Reproductive: Status post hysterectomy. No adnexal masses. Other: No free air, free fluid, focal fluid collection, or abdominal wall hernia. Musculoskeletal: Prior right hip ORIF. Prior L4-5 fusion. Severe adjacent segment disease at L3-4 with grade 2-3 anterolisthesis. No acute fracture. IMPRESSION: 1. Long segment wall thickening extending from the mid transverse colon to the distal descending colon with hazy pericolonic fat stranding. Findings are suggestive of an infectious or inflammatory colitis. 2. Partially visualized consolidation in the posterior left lung base may reflect atelectasis versus pneumonia. 3. Bilateral nonobstructing nephrolithiasis. 4. Prior L4-5 PLIF with severe adjacent segment disease at L3-4 where there is grade 2-3 anterolisthesis. 5. Aortic atherosclerosis. (ICD10-I70.0). Electronically Signed   By: Davina Poke D.O.   On: 01/08/2020 17:00   DG Abdomen Acute W/Chest  Result Date: 01/08/2020 CLINICAL DATA:  86 year old female with abdominal pain and fever. EXAM: DG ABDOMEN ACUTE W/ 1V CHEST COMPARISON:  Abdominal CT dated 01/08/2020. FINDINGS: There is no bowel dilatation or evidence of obstruction. No free air. Small bilateral renal calculi noted. There is degenerative changes of the spine and lower lumbar fusion as well as partially visualized right femoral neck screw. No acute osseous pathology. Left lung base atelectasis or infiltrate. IMPRESSION: 1. No evidence of bowel obstruction. 2. Bilateral renal calculi. Electronically Signed   By: Anner Crete M.D.   On: 01/08/2020 16:28    EKG:    Assessment/Plan Active Problems:   * No active hospital problems. *    #1 sepsis likely secondary to colitis.  Patient admitted with fever, tachycardia, hypotension and leukocytosis.  Started on Vanco and cefepime in the ER.  Urine analysis is pending at this time urine culture and blood culture when done in the ER. CT abdomen and pelvis-Long segment wall thickening extending from the mid transverse colon to the distal descending colon with hazy pericolonic fat stranding. Findings are suggestive of an infectious or inflammatory colitis.  Partially visualized consolidation in the posterior left lung base may reflect atelectasis versus pneumonia.  Bilateral nonobstructing nephrolithiasis.  Prior L4-5 PLIF with severe adjacent segment disease at L3-4 where there is grade 2-3 anterolisthesis.  Aortic atherosclerosis. (ICD10-I70.0).  #2 leukocytosis likely secondary to colitis.  UA pending.  Chest x-ray pending.  #3 history of Alzheimer's dementia continue Depakote and Namenda and Seroquel  #4 goals of care discussed with POA her son [name].  Patient is DNR.  Patient has been living in friend's home for the last 3 years.  #5 history of depression on Cymbalta  #6 history of hypothyroidism on Synthroid  #7 history of hypertension on cystoscopy at home which we will hold due to hypotension  #8 insomnia continue trazodone.  #9 stage IIIa CKD continue IV fluids and recheck labs in a.m.  Severity of Illness: The appropriate patient status for this patient is INPATIENT. Inpatient status is judged to be reasonable and necessary in order to provide the required intensity of service to ensure the patient's safety. The patient's presenting symptoms,  physical exam findings, and initial radiographic and laboratory data in the context of their chronic comorbidities is felt to place them at high risk for further clinical deterioration. Furthermore, it is not anticipated that the patient will be  medically stable for discharge from the hospital within 2 midnights of admission. The following factors support the patient status of inpatient.   " The patient's presenting symptoms include .  Nausea vomiting abdominal pain and diarrhea " The worrisome physical exam findings include dehydration"  The initial radiographic and laboratory data are worrisome because of colitis " The chronic co-morbidities include dementia and recurrent falls hypothyroidism hypotension CKD   * I certify that at the point of admission it is my clinical judgment that the patient will require inpatient hospital care spanning beyond 2 midnights from the point of admission due to high intensity of service, high risk for further deterioration and high frequency of surveillance required.*   Estimated body mass index is 31.14 kg/m as calculated from the following:   Height as of an earlier encounter on 01/08/20: 4\' 11"  (1.499 m).   Weight as of an earlier encounter on 01/08/20: 69.9 kg.   DVT prophylaxis: Lovenox Code Status: DO NOT RESUSCITATE Family Communication discussed with son [name] Disposition Plan: Pending clinical improvement Consults called none Admission status: Inpatient   Georgette Shell MD [facility]  If 7PM-7AM, please contact night-coverage [email or web address] Password [facility]  01/08/2020, 5:43 PM

## 2020-01-08 NOTE — Assessment & Plan Note (Addendum)
On Quetiapine, Trazodone, Lorazepam, Duloxetine, Depakote.

## 2020-01-08 NOTE — ED Provider Notes (Signed)
Patient signed to me by Dr. Alvino Chapel pending abdominal CT which does show evidence of colitis.  Likely source of patient's current infection and she has already been started on IV antibiotics and will be admitted to the hospital   Haley Leigh, MD 01/08/20 1712

## 2020-01-08 NOTE — Progress Notes (Signed)
A consult was received from an ED physician for Cefepime & Vancomycin per pharmacy dosing.  The patient's profile has been reviewed for ht/wt/allergies/indication/available labs.   A one time order has been placed for Cefepime 2gm & Vancomycin 1gm IV.  Further antibiotics/pharmacy consults should be ordered by admitting physician if indicated.                       Thank you, Netta Cedars PharmD 01/08/2020  2:27 PM

## 2020-01-08 NOTE — Progress Notes (Signed)
Location:   [facility] Room Number: 42 Place of Service:  SNF (31) Provider:  Marda Stalker, Lennie Odor NP   [name], MD  Patient Care Team: [name], MD as PCP - General (Internal Medicine) Maynard David X, NP as Nurse Practitioner (Internal Medicine) [name], MD (Internal Medicine)  Extended Emergency Contact Information Primary Emergency Contact: [facility] Address: [address]          [place], [place] [address] [name] of [place] Phone: [phone] Relation: Son  Code Status:  DNR Goals of care: Advanced Directive information Advanced Directives 01/03/2020  Does Patient Have a Medical Advance Directive? Yes  Type of Advance Directive Out of facility DNR (pink MOST or yellow form);Living will  Does patient want to make changes to medical advance directive? No - Patient declined  Copy of [place] in Chart? -  Would patient like information on creating a medical advance directive? -  Pre-existing out of facility DNR order (yellow form or pink MOST form) Yellow form placed in chart (order not valid for inpatient use)     Chief Complaint  Patient presents with  . Acute Visit    Nausea    HPI:  Pt is a 86 y.o. female seen today for an acute visit for fever, lethargy, decreased oral intake, nauseated and vomited last night, abd Xray no acute abd, Wbc was trended up from 8.3 in am to 15.1 pm yesterday, pending UA C/S. No cough or O2 87%, Bp is low 90/60 (140/80 early this morning)  The patient is s/p IM nailing for tibial/fibula  fx during hospital stay 5/25-5/28. WBAT, on Tramadol, Tylenol, brace, ASA for VTE risk reduction  Hx of HTN, on Lisinopril,  Hypothyroidism on Levothyroxine,  Dementia, on Namenda, advanced stage,  Depression, on Quetiapine, Trazodone, Lorazepam, Depaklote.  Duloxetine, post op anemia, Hgb 9s, on Fe   Past Medical History:  Diagnosis Date  . Alzheimer disease ([place]) 10/27/2016  .  Arthritis   . Confusion 04/04/2019  . Depression, recurrent ([place]) 10/27/2016  . History of fracture of right hip 04/04/2019   07/01/19 Ortho, R hip incision healed, X-ray looks good, s/p IMN R hip fx, WBAT, f/u 6 mos.   Marland Kitchen HTN (hypertension) 10/27/2016  . Hypertension   . Hypertensive kidney disease with CKD (chronic kidney disease) stage V ([place]) 11/02/2016   11/02/16 Na 136, K 4.7, Bun 30, creat 1.52, TSH 0.86, wbc 6.7, Hgb 11.3, plt 264  . Hypothyroidism 10/27/2016  . Osteoarthritis 10/27/2016  . Thyroid disease    Past Surgical History:  Procedure Laterality Date  . ABDOMINAL HYSTERECTOMY  1986  . INTRAMEDULLARY (IM) NAIL INTERTROCHANTERIC Right 04/04/2019   Procedure: INTRAMEDULLARY (IM) NAIL INTERTROCHANTRIC;  Surgeon: Rod Can, MD;  Location: [facility];  Service: Orthopedics;  Laterality: Right;  . KNEE ARTHROSCOPY  2015/2017   Dr. Gustavus Bryant  . SPINE SURGERY  2009, 2011   Dr.Mark Nicoletta Dress  . TIBIA IM NAIL INSERTION Right 01/01/2020   Procedure: INTRAMEDULLARY (IM) NAIL TIBIAL;  Surgeon: Shona Needles, MD;  Location: [facility];  Service: Orthopedics;  Laterality: Right;    No Known Allergies  Allergies as of 01/08/2020   No Known Allergies     Medication List       Accurate as of January 08, 2020  1:05 PM. If you have any questions, ask your nurse or doctor.        STOP taking these medications   ferrous sulfate 325 (  65 FE) MG tablet Stopped by: Brason Berthelot X Jamiracle Avants, NP     TAKE these medications   acetaminophen 325 MG tablet Commonly known as: TYLENOL Take 650 mg by mouth 2 (two) times daily.   ascorbic acid 500 MG tablet Commonly known as: VITAMIN C Take 500 mg by mouth daily.   aspirin EC 325 MG tablet Take 1 tablet (325 mg total) by mouth in the morning and at bedtime.   CALCIUM 600 PO Take 1 tablet by mouth daily.   cholecalciferol 25 MCG (1000 UNIT) tablet Commonly known as: VITAMIN D3 Take 2,000 Units by mouth daily.   divalproex 250 MG DR tablet Commonly known as:  DEPAKOTE Take 250 mg by mouth in the morning and at bedtime.   docusate sodium 100 MG capsule Commonly known as: COLACE Take 1 capsule (100 mg total) by mouth 2 (two) times daily.   DULoxetine 30 MG capsule Commonly known as: CYMBALTA Take 30 mg by mouth daily.   levothyroxine 25 MCG tablet Commonly known as: SYNTHROID Take 25 mcg by mouth daily before breakfast.   lisinopril 10 MG tablet Commonly known as: ZESTRIL Take 10 mg by mouth daily.   LORazepam 0.5 MG tablet Commonly known as: ATIVAN Take 0.5 mg by mouth at bedtime.   memantine 10 MG tablet Commonly known as: NAMENDA Take 10 mg by mouth 2 (two) times daily.   ondansetron 4 MG disintegrating tablet Commonly known as: ZOFRAN-ODT Take 4 mg by mouth once.   QUEtiapine 25 MG tablet Commonly known as: SEROQUEL Take 25 mg by mouth at bedtime.   traMADol 50 MG tablet Commonly known as: ULTRAM Take 0.5 tablets (25 mg total) by mouth every 6 (six) hours as needed.   traZODone 50 MG tablet Commonly known as: DESYREL Take 50 mg by mouth at bedtime.   vitamin B-12 1000 MCG tablet Commonly known as: CYANOCOBALAMIN Take 1,000 mcg by mouth. Once a day on Monday and Wednesday       Review of Systems  Constitutional: Positive for activity change, appetite change, fatigue and fever. Negative for chills and diaphoresis.       Lethargic  HENT: Positive for hearing loss. Negative for congestion and voice change.   Eyes: Negative for visual disturbance.  Respiratory: Positive for shortness of breath. Negative for cough and wheezing.        Sat O2 87%  Cardiovascular: Negative for leg swelling.  Gastrointestinal: Positive for nausea and vomiting. Negative for abdominal distention, abdominal pain, constipation and diarrhea.  Genitourinary: Negative for difficulty urinating, dysuria, frequency and urgency.  Musculoskeletal: Positive for arthralgias and gait problem.       S/p left lower leg IM tibia  Skin: Negative for  color change and pallor.  Neurological: Negative for dizziness, speech difficulty and weakness.       Dementia, lethargic.   Psychiatric/Behavioral: Positive for agitation, behavioral problems, confusion and sleep disturbance. The patient is nervous/anxious.     Immunization History  Administered Date(s) Administered  . Influenza-Unspecified 05/29/2017  . Moderna SARS-COVID-2 Vaccination 09/07/2019, 10/05/2019  . Pneumococcal Conjugate-13 06/21/2017  . Pneumococcal Polysaccharide-23 07/16/2018  . Tdap 06/21/2017   Pertinent  Health Maintenance Due  Topic Date Due  . DEXA SCAN  Completed  . PNA vac Low Risk Adult  Completed  . INFLUENZA VACCINE  Discontinued   Fall Risk  04/27/2018 04/25/2017  Falls in the past year? No No   Functional Status Survey:    Vitals:   01/08/20 1042  BP: 140/80  Pulse: 100  Resp: 20  Temp: 100 F (37.8 C)  SpO2: 93%  Weight: 154 lb 3.2 oz (69.9 kg)  Height: 4' 11"  (1.499 m)   Body mass index is 31.14 kg/m. Physical Exam Vitals and nursing note reviewed.  Constitutional:      General: She is in acute distress.     Appearance: She is ill-appearing and toxic-appearing.     Comments: lethargic  HENT:     Head: Normocephalic and atraumatic.     Mouth/Throat:     Mouth: Mucous membranes are dry.  Eyes:     Extraocular Movements: Extraocular movements intact.     Conjunctiva/sclera: Conjunctivae normal.     Pupils: Pupils are equal, round, and reactive to light.  Cardiovascular:     Rate and Rhythm: Regular rhythm. Tachycardia present.     Heart sounds: No murmur.  Pulmonary:     Effort: Pulmonary effort is normal.     Breath sounds: No rales.  Abdominal:     General: Bowel sounds are normal.     Palpations: Abdomen is soft.     Tenderness: There is no abdominal tenderness. There is no right CVA tenderness, left CVA tenderness, guarding or rebound.  Musculoskeletal:        General: Tenderness present.     Cervical back: Normal range  of motion and neck supple.     Right lower leg: No edema.     Left lower leg: No edema.     Comments: Brace LLE, s/p IM rod for tibia fx  Skin:    General: Skin is warm and dry.  Neurological:     General: No focal deficit present.     Mental Status: She is alert.     Gait: Gait abnormal.  Psychiatric:     Comments: lethargic     Labs reviewed: Recent Labs    12/31/19 1900 12/31/19 1900 01/02/20 0923 01/03/20 0615 01/07/20 0000  NA 140   < > 140 140 139  K 4.5   < > 3.7 3.8 3.9  CL 104   < > 104 106 103  CO2 24   < > 25 29 27*  GLUCOSE 112*  --  139* 101*  --   BUN 15   < > 16 17 23*  CREATININE 0.94   < > 1.17* 1.04* 1.1  CALCIUM 10.4*  --  9.0 9.4  --    < > = values in this interval not displayed.   Recent Labs    07/30/19 0000 07/30/19 0000 11/07/19 0000 01/02/20 0923 01/07/20 0000  AST 11*   < > 10* 18 11*  ALT 8   < > 7 10 6*  ALKPHOS 117   < > 92 59 76  BILITOT  --   --   --  0.3  --   PROT  --   --   --  5.9*  --   ALBUMIN 3.8  --   --  2.9*  --    < > = values in this interval not displayed.   Recent Labs    04/07/19 0324 07/30/19 0000 09/03/19 0000 11/07/19 0000 12/31/19 1900 12/31/19 1900 01/02/20 0923 01/03/20 0615 01/07/20 0000  WBC   < > 6.1   < > 5.3 10.1   < > 9.9 8.5 8.3  NEUTROABS  --  3,630  --  2,417  --   --   --   --  4,856  HGB   < >  12.7   < > 11.8* 12.5   < > 8.9* 8.9* 9.0*  HCT   < > 39   < > 36 40.1   < > 28.4* 28.1* 27*  MCV   < >  --   --   --  96.2  --  96.9 96.9  --   PLT   < > 232   < > 221 217   < > 200 177 318   < > = values in this interval not displayed.   Lab Results  Component Value Date   TSH 2.43 04/11/2019   Lab Results  Component Value Date   HGBA1C 5.3 11/01/2016   No results found for: CHOL, HDL, LDLCALC, LDLDIRECT, TRIG, CHOLHDL  Significant Diagnostic Results in last 30 days:  DG Tibia/Fibula Right  Result Date: 01/01/2020 CLINICAL DATA:  Right tibia and fibula fractures. EXAM: RIGHT TIBIA  AND FIBULA - 2 VIEW; DG C-ARM 1-60 MIN COMPARISON:  12/31/2019 FINDINGS: Nine C-arm views of the right lower leg demonstrate intramedullary rod and screw fixation of the previously demonstrated distal tibia fracture with essentially anatomic position and alignment. The previously described fibular shaft fracture is again noted. Stable knee prosthesis. IMPRESSION: Hardware fixation of the previously demonstrated distal tibia fracture. Electronically Signed   By: Claudie Revering M.D.   On: 01/01/2020 13:59   DG Ankle Complete Right  Result Date: 12/31/2019 CLINICAL DATA:  [place].  Tib fib deformity. EXAM: RIGHT ANKLE - COMPLETE 3+ VIEW COMPARISON:  None. FINDINGS: Displaced spiral type fracture of the mid distal tibial shaft and the mid fibular shaft. There is 1 shaft width of lateral and anterior displacement of the tibial fracture with a moderate-sized butterfly fragment. The ankle and hindfoot bony structures are intact. IMPRESSION: Displaced spiral type fractures of the mid distal tibial shaft and the mid fibula. Electronically Signed   By: Marijo Sanes M.D.   On: 12/31/2019 18:51   DG Knee Complete 4 Views Right  Result Date: 12/31/2019 CLINICAL DATA:  Knee pain after fall EXAM: RIGHT KNEE - COMPLETE 4+ VIEW COMPARISON:  None. FINDINGS: Normal appearance of right total knee arthroplasty. There is a laterally displaced oblique fracture of the midshaft of the right fibula. No knee effusion. IMPRESSION: Laterally displaced oblique fracture of the midshaft of the right fibula. Right total knee arthroplasty without adverse features. Electronically Signed   By: Ulyses Jarred M.D.   On: 12/31/2019 19:39   DG Tibia/Fibula Right Port  Result Date: 01/01/2020 CLINICAL DATA:  Status post right tibial fixation. EXAM: PORTABLE RIGHT TIBIA AND FIBULA - 2 VIEW COMPARISON:  Dec 31, 2019. FINDINGS: Status post intramedullary rod fixation of distal right tibial fracture. Good alignment of fracture components is noted.  Stable appearance of mid fibular shaft fracture. IMPRESSION: Status post intramedullary rod fixation of distal right tibial fracture. Electronically Signed   By: Marijo Conception M.D.   On: 01/01/2020 16:08   DG Toe Great Right  Result Date: 12/31/2019 CLINICAL DATA:  Fall with toe pain EXAM: RIGHT GREAT TOE COMPARISON:  None. FINDINGS: There is no evidence of fracture or dislocation. There is no evidence of arthropathy or other focal bone abnormality. Soft tissues are unremarkable. IMPRESSION: Negative. Electronically Signed   By: Ulyses Jarred M.D.   On: 12/31/2019 19:38   DG C-Arm 1-60 Min  Result Date: 01/01/2020 CLINICAL DATA:  Right tibia and fibula fractures. EXAM: RIGHT TIBIA AND FIBULA - 2 VIEW; DG C-ARM 1-60 MIN COMPARISON:  12/31/2019 FINDINGS:  Nine C-arm views of the right lower leg demonstrate intramedullary rod and screw fixation of the previously demonstrated distal tibia fracture with essentially anatomic position and alignment. The previously described fibular shaft fracture is again noted. Stable knee prosthesis. IMPRESSION: Hardware fixation of the previously demonstrated distal tibia fracture. Electronically Signed   By: Claudie Revering M.D.   On: 01/01/2020 13:59    Assessment/Plan Fever of unknown origin In setting of elevated wbc from 8.3 am to 15.1 pm, pending UA C/S, Rocephin 1gm IM q12hrs for now, repeat CBC/diff in am The patient is more lethargic, persisted fever not responding to Tylenol, ED eval and tx.   Chronic kidney disease, stage 3a 01/07/20 eGFR 35, wbc 8.3(am)/15.1(pm), Hgb 9.9, TSH 2.48, Neutrophils 85, Na 140, K 4.2, Bun 27, creat 1.35, lipase 17,  encourage oral fluid intake, update BMP in am  Tibial fracture S/p IM, in brace, Tramadol, Tylenol for pain.   Essential hypertension Low Bp measurement, didn't take  Lisinopril today.   Mixed Alzheimer's and vascular dementia ([place]) Advanced, continue Memantine.   Hypothyroidism Stable, continue Levothyroxine.     Blood loss anemia Hgb 9s, continue Fe  Depression, recurrent ([facility]) On Quetiapine, Trazodone, Lorazepam, Duloxetine, Depakote.   Hypoxemia O2 87% RA, O2 2lpm via [place], ED eval/tx.   Leukocytosis Trended up from 8.3 in am to 15.1 pm yesterday, ED eval and tx.      Family/ staff Communication: plan of care reviewed with the patient and charge nurse.   Labs/tests ordered:  CBC/diff, BMP in am  Time spend 35 minutes.

## 2020-01-08 NOTE — Assessment & Plan Note (Signed)
Stable, continue Levothyroxine.  

## 2020-01-08 NOTE — ED Notes (Signed)
Called for more LR at this time.

## 2020-01-08 NOTE — Assessment & Plan Note (Signed)
Advanced, continue Memantine.

## 2020-01-08 NOTE — ED Provider Notes (Signed)
[facility] DEPT Provider Note   CSN: [id number] Arrival date & time: 01/08/20  1400     History Chief Complaint  Patient presents with  . Altered Mental Status  . Fever    Haley Cohen is a 86 y.o. female. Level 5 caveat due to dementia. HPI Patient presents with fever and lethargy.  Baseline dementia.  Reportedly had vomiting abdominal pain.  Fever there.  Reviewing notes did not have a clear source for the fever.  Also reportedly had rectal prolapse yesterday.  Recent right tibial surgery by Dr.Haddix.  Patient is a DNR.  Reportedly white count of 8 yesterday and reportedly went up to 15 last night but do not have this in the computer.  Reportedly also has some mild hypoxia.  Room air sats down to 87%.    Past Medical History:  Diagnosis Date  . Alzheimer disease ([place]) 10/27/2016  . Arthritis   . Confusion 04/04/2019  . Depression, recurrent ([place]) 10/27/2016  . History of fracture of right hip 04/04/2019   07/01/19 Ortho, R hip incision healed, X-ray looks good, s/p IMN R hip fx, WBAT, f/u 6 mos.   Marland Kitchen HTN (hypertension) 10/27/2016  . Hypertension   . Hypertensive kidney disease with CKD (chronic kidney disease) stage V ([place]) 11/02/2016   11/02/16 Na 136, K 4.7, Bun 30, creat 1.52, TSH 0.86, wbc 6.7, Hgb 11.3, plt 264  . Hypothyroidism 10/27/2016  . Osteoarthritis 10/27/2016  . Thyroid disease     Patient Active Problem List   Diagnosis Date Noted  . Fever of unknown origin 01/08/2020  . Hypoxemia 01/08/2020  . Tibial fracture 01/01/2020  . Hypercalcemia 12/31/2019  . Right fibular fracture 12/31/2019  . Fracture of tibial shaft, right, closed 12/31/2019  . Fracture of tibial shaft, left, closed 12/31/2019  . Chronic kidney disease, stage 3a 11/11/2019  . Slow transit constipation 09/25/2019  . Blood loss anemia 04/19/2019  . Mixed Alzheimer's and vascular dementia ([place]) 04/09/2019  . Gait abnormality 04/09/2019  . History of fracture  of right hip 04/04/2019  . Leukocytosis 04/04/2019  . Essential hypertension 04/04/2019  . Hypothyroidism 04/04/2019  . Edema 07/11/2018  . Insomnia 06/15/2018  . Incontinent of urine 04/27/2018  . Fall 02/02/2018  . B12 deficiency 10/17/2017  . Depression, recurrent ([place]) 10/27/2016  . Osteoarthritis 10/27/2016    Past Surgical History:  Procedure Laterality Date  . ABDOMINAL HYSTERECTOMY  1986  . INTRAMEDULLARY (IM) NAIL INTERTROCHANTERIC Right 04/04/2019   Procedure: INTRAMEDULLARY (IM) NAIL INTERTROCHANTRIC;  Surgeon: Rod Can, MD;  Location: [facility];  Service: Orthopedics;  Laterality: Right;  . KNEE ARTHROSCOPY  2015/2017   Dr. Gustavus Bryant  . SPINE SURGERY  2009, 2011   Dr.Mark Nicoletta Dress  . TIBIA IM NAIL INSERTION Right 01/01/2020   Procedure: INTRAMEDULLARY (IM) NAIL TIBIAL;  Surgeon: Shona Needles, MD;  Location: [facility];  Service: Orthopedics;  Laterality: Right;     OB History   No obstetric history on file.     Family History  Problem Relation Age of Onset  . Cancer Mother     Social History   Tobacco Use  . Smoking status: Never Smoker  . Smokeless tobacco: Never Used  Substance Use Topics  . Alcohol use: Not Currently  . Drug use: Never    Home Medications Prior to Admission medications   Medication Sig Start Date End Date Taking? Authorizing Provider  acetaminophen (TYLENOL) 325 MG tablet Take 650 mg by mouth 2 (two) times daily.  [provider]  ascorbic acid (VITAMIN C) 500 MG tablet Take 500 mg by mouth daily.    [provider]  aspirin EC 325 MG tablet Take 1 tablet (325 mg total) by mouth in the morning and at bedtime. 01/02/20 02/01/20  Delray Alt, PA-C  Calcium Carbonate (CALCIUM 600 PO) Take 1 tablet by mouth daily.    [provider]  cholecalciferol (VITAMIN D3) 25 MCG (1000 UT) tablet Take 2,000 Units by mouth daily.     [provider]  divalproex (DEPAKOTE) 250 MG DR tablet Take 250 mg by mouth in the  morning and at bedtime.    [provider]  docusate sodium (COLACE) 100 MG capsule Take 1 capsule (100 mg total) by mouth 2 (two) times daily. 04/05/19   Donne Hazel, MD  DULoxetine (CYMBALTA) 30 MG capsule Take 30 mg by mouth daily. 04/08/19   [provider]  levothyroxine (SYNTHROID, LEVOTHROID) 25 MCG tablet Take 25 mcg by mouth daily before breakfast.    [provider]  lisinopril (ZESTRIL) 10 MG tablet Take 10 mg by mouth daily.    [provider]  LORazepam (ATIVAN) 0.5 MG tablet Take 0.5 mg by mouth at bedtime.    [provider]  memantine (NAMENDA) 10 MG tablet Take 10 mg by mouth 2 (two) times daily.    [provider]  ondansetron (ZOFRAN-ODT) 4 MG disintegrating tablet Take 4 mg by mouth once.    [provider]  QUEtiapine (SEROQUEL) 25 MG tablet Take 25 mg by mouth at bedtime.    [provider]  traMADol (ULTRAM) 50 MG tablet Take 0.5 tablets (25 mg total) by mouth every 6 (six) hours as needed. 01/07/20   [name], MD  traZODone (DESYREL) 50 MG tablet Take 50 mg by mouth at bedtime.    [provider]  vitamin B-12 (CYANOCOBALAMIN) 1000 MCG tablet Take 1,000 mcg by mouth. Once a day on Monday and Wednesday    [provider]    Allergies    Patient has no known allergies.  Review of Systems   Review of Systems  Unable to perform ROS: Dementia    Physical Exam Updated Vital Signs BP (!) 90/48   Pulse 75   Temp (!) 101.1 F (38.4 C) (Rectal)   Resp (!) 26   SpO2 98%   Physical Exam Vitals reviewed.  Constitutional:      Appearance: Normal appearance.  HENT:     Head: Atraumatic.  Eyes:     General: No scleral icterus. Cardiovascular:     Rate and Rhythm: Regular rhythm.  Pulmonary:     Breath sounds: No wheezing or rhonchi.  Abdominal:     Comments: Diffuse tenderness, but worse on left side.  Musculoskeletal:     Comments: Some edema bilaterally.  Edema with  some erythema and warmth on right lower leg.  Recent surgery at the site.  Skin:    Capillary Refill: Capillary refill takes less than 2 seconds.  Neurological:     Comments: Awake and confused.         ED Results / Procedures / Treatments   Labs (all labs ordered are listed, but only abnormal results are displayed) Labs Reviewed  CULTURE, BLOOD (ROUTINE X 2)  CULTURE, BLOOD (ROUTINE X 2)  URINE CULTURE  LACTIC ACID, PLASMA  LACTIC ACID, PLASMA  COMPREHENSIVE METABOLIC PANEL  CBC WITH DIFFERENTIAL/PLATELET  APTT  PROTIME-INR  URINALYSIS, ROUTINE W REFLEX MICROSCOPIC  EKG EKG Interpretation  Date/Time:  Wednesday January 08 2020 14:21:07 EDT Ventricular Rate:  76 PR Interval:    QRS Duration: 143 QT Interval:  422 QTC Calculation: 475 R Axis:   80 Text Interpretation: Sinus rhythm Right bundle branch block Confirmed by Davonna Belling [phone]) on 01/08/2020 2:39:16 PM   Radiology No results found.  Procedures Procedures (including critical care time)  Medications Ordered in ED Medications  lactated ringers bolus 1,000 mL (1,000 mLs Intravenous New Bag/Given 01/08/20 1454)    And  lactated ringers bolus 1,000 mL (1,000 mLs Intravenous New Bag/Given 01/08/20 1458)    And  lactated ringers bolus 250 mL (has no administration in time range)  ceFEPIme (MAXIPIME) 2 g in sodium chloride 0.9 % 100 mL IVPB (has no administration in time range)  metroNIDAZOLE (FLAGYL) IVPB 500 mg (has no administration in time range)  vancomycin (VANCOCIN) IVPB 1000 mg/200 mL premix (has no administration in time range)    ED Course  I have reviewed the triage vital signs and the nursing notes.  Pertinent labs & imaging results that were available during my care of the patient were reviewed by me and considered in my medical decision making (see chart for details).    MDM Rules/Calculators/A&P                      Patient with baseline dementia.  Sent home from nursing home with  fever.  White count reportedly went from 8-15 yesterday.  Has some erythema on lower leg at site of recent surgery.  Also abdominal tenderness.  Mild hypoxia.  Will get acute abdominal series.  Lab work pending.  Blood cultures pending.  30/kg fluid boluses given.  Antibiotics started with unknown source due to possible pulmonary abdominal or skin source.  Also x-ray lower leg.  Care turned over to Dr. Zenia Resides.  CRITICAL CARE Performed by: Davonna Belling Total critical care time: 30 minutes Critical care time was exclusive of separately billable procedures and treating other patients. Critical care was necessary to treat or prevent imminent or life-threatening deterioration. Critical care was time spent personally by me on the following activities: development of treatment plan with patient and/or surrogate as well as nursing, discussions with consultants, evaluation of patient's response to treatment, examination of patient, obtaining history from patient or surrogate, ordering and performing treatments and interventions, ordering and review of laboratory studies, ordering and review of radiographic studies, pulse oximetry and re-evaluation of patient's condition.  Final Clinical Impression(s) / ED Diagnoses Final diagnoses:  Sepsis, due to unspecified organism, unspecified whether acute organ dysfunction present [facility])    Rx / DC Orders ED Discharge Orders    None       Davonna Belling, MD 01/08/20 1500

## 2020-01-08 NOTE — Assessment & Plan Note (Signed)
S/p IM, in brace, Tramadol, Tylenol for pain.

## 2020-01-08 NOTE — ED Notes (Signed)
Transport notified need for transport to inpatient room

## 2020-01-08 NOTE — Assessment & Plan Note (Signed)
01/07/20 eGFR 35, wbc 8.3(am)/15.1(pm), Hgb 9.9, TSH 2.48, Neutrophils 85, Na 140, K 4.2, Bun 27, creat 1.35, lipase 17,  encourage oral fluid intake, update BMP in am

## 2020-01-08 NOTE — Assessment & Plan Note (Signed)
Hgb 9s, continue Fe

## 2020-01-08 NOTE — Assessment & Plan Note (Signed)
O2 87% RA, O2 2lpm via [place], ED eval/tx.

## 2020-01-08 NOTE — ED Notes (Signed)
Pt arrives with cam boot to right lower leg. Upon removing boot, no skin break down noted. Minimal redness and edema noted.

## 2020-01-08 NOTE — Progress Notes (Signed)
Notified bedside nurse of need to administer fluid bolus.  210ml bolus still left uncharted as to if it was given. Pt had BP readings that require all fluids that were ordered by MD.  Bedside RN responded back the unit is currently out of LR and when more arrives the remaining ordered fluids will be given.

## 2020-01-08 NOTE — Assessment & Plan Note (Addendum)
In setting of elevated wbc from 8.3 am to 15.1 pm, pending UA C/S, Rocephin 1gm IM q12hrs for now, repeat CBC/diff in am The patient is more lethargic, persisted fever not responding to Tylenol, ED eval and tx.

## 2020-01-09 ENCOUNTER — Inpatient Hospital Stay (HOSPITAL_COMMUNITY): Payer: Medicare Other

## 2020-01-09 DIAGNOSIS — R609 Edema, unspecified: Secondary | ICD-10-CM

## 2020-01-09 LAB — COMPREHENSIVE METABOLIC PANEL
ALT: 9 U/L (ref 0–44)
AST: 11 U/L — ABNORMAL LOW (ref 15–41)
Albumin: 2.5 g/dL — ABNORMAL LOW (ref 3.5–5.0)
Alkaline Phosphatase: 49 U/L (ref 38–126)
Anion gap: 9 (ref 5–15)
BUN: 27 mg/dL — ABNORMAL HIGH (ref 8–23)
CO2: 23 mmol/L (ref 22–32)
Calcium: 8.8 mg/dL — ABNORMAL LOW (ref 8.9–10.3)
Chloride: 108 mmol/L (ref 98–111)
Creatinine, Ser: 0.98 mg/dL (ref 0.44–1.00)
GFR calc Af Amer: >60 mL/min (ref >60)
GFR calc non Af Amer: 52 mL/min — ABNORMAL LOW (ref >60)
Glucose, Bld: 103 mg/dL — ABNORMAL HIGH (ref 70–99)
Potassium: 3.8 mmol/L (ref 3.5–5.1)
Sodium: 140 mmol/L (ref 135–145)
Total Bilirubin: 0.6 mg/dL (ref 0.3–1.2)
Total Protein: 5.4 g/dL — ABNORMAL LOW (ref 6.5–8.1)

## 2020-01-09 LAB — CBC
HCT: 22.8 % — ABNORMAL LOW (ref 36.0–46.0)
Hemoglobin: 6.9 g/dL — CL (ref 12.0–15.0)
MCH: 29.9 pg (ref 26.0–34.0)
MCHC: 30.3 g/dL (ref 30.0–36.0)
MCV: 98.7 fL (ref 80.0–100.0)
Platelets: 207 10*3/uL (ref 150–400)
RBC: 2.31 MIL/uL — ABNORMAL LOW (ref 3.87–5.11)
RDW: 14.9 % (ref 11.5–15.5)
WBC: 12.6 10*3/uL — ABNORMAL HIGH (ref 4.0–10.5)
nRBC: 0 % (ref 0.0–0.2)

## 2020-01-09 LAB — PREPARE RBC (CROSSMATCH)

## 2020-01-09 LAB — URINE CULTURE: Culture: 80000 — AB

## 2020-01-09 LAB — PROCALCITONIN: Procalcitonin: 26.61 ng/mL

## 2020-01-09 LAB — ABO/RH: ABO/RH(D): O NEG

## 2020-01-09 MED ORDER — SODIUM CHLORIDE 0.9% IV SOLUTION: Freq: Once | INTRAVENOUS | Status: AC

## 2020-01-09 MED ORDER — ENOXAPARIN SODIUM 40 MG/0.4ML ~~LOC~~ SOLN
40.0000 mg | SUBCUTANEOUS | Status: DC
  Administered 2020-01-09 – 2020-01-11 (×3): 40 mg via SUBCUTANEOUS
  Filled 2020-01-09 (×3): qty 0.4

## 2020-01-09 MED ORDER — QUETIAPINE FUMARATE 25 MG PO TABS
25.0000 mg | ORAL_TABLET | Freq: Two times a day (BID) | ORAL | Status: DC
  Administered 2020-01-09 – 2020-01-12 (×6): 25 mg via ORAL
  Filled 2020-01-09 (×6): qty 1

## 2020-01-09 MED ORDER — CIPROFLOXACIN IN D5W 400 MG/200ML IV SOLN
400.0000 mg | Freq: Two times a day (BID) | INTRAVENOUS | Status: DC
  Administered 2020-01-09 – 2020-01-11 (×5): 400 mg via INTRAVENOUS
  Filled 2020-01-09 (×6): qty 200

## 2020-01-09 NOTE — Progress Notes (Signed)
VAST consulted to obtain IV access. Upon arrival at bedside pt having a procedure. Will return shortly to assess vasculature.

## 2020-01-09 NOTE — Progress Notes (Signed)
CRITICAL VALUE ALERT  Critical Value:  Hgb 6.9  Date & Time Notied:  6/3 0455  Provider Notified: Dr. Hal Hope  Orders Received/Actions taken: See Orders

## 2020-01-09 NOTE — Progress Notes (Signed)
PROGRESS NOTE    Haley Cohen  [id number] DOB: 1931/11/20 DOA: 01/08/2020 PCP: [name], MD   Brief Narrative Haley Cohen is a 86 y.o. female with medical history significant of dementia, depression hypertension, hypothyroidism admitted with fever. Patient unable to provide me with any history history is obtained from the records.  Per records patient was hypoxic with room air saturation 87%.  She had right knee surgery by Dr. Doreatha Martin recently.  And had increased white count prior to admission. Per patient's son patient had nausea vomiting and diarrhea since yesterday with fever associated with abdominal pain she was more lethargic and more sleeping than usual.  She also had leukocytosis prior to admission.  Her baseline is can she is confused and she sleeps intermittently.  Per son she had hip surgery and knee surgery last week.  ED Course: Patient was started on Vanco and cefepime.  Her vital signs where 103/47 with a temp of 101.1 pulse of 70 respiration 23-96% on 2 L.  Assessment & Plan:   Active Problems:   Colitis   #1 sepsis likely secondary to colitis.  Patient admitted with fever, tachycardia, hypotension and leukocytosis.  Started on Vanco and cefepime in the ER.   CT abdomen and pelvis-Long segment wall thickening extending from the mid transverse colon to the distal descending colon with hazy pericolonic fat stranding. Findings are suggestive of an infectious or inflammatory colitis.  Partially visualized consolidation in the posterior left lung base may reflect atelectasis versus pneumonia.  Bilateral nonobstructing nephrolithiasis.   Patient is afebrile leukocytosis trending down.  Continue IV Cipro and Flagyl.  #2 leukocytosis likely secondary to colitis.  UA leukocytes with 25-30 WBCs.  Urine culture is pending.  Chest x-ray left basilar atelectasis or scarring.  Procalcitonin 33 and 26 continue IV antibiotics.  #3 history of Alzheimer's dementia  continue Depakote and Namenda and Seroquel  #4 goals of care discussed with POA her son [name].  Patient is DNR.  Patient has been living in [facility] for the last 3 years.  #5 history of depression on Cymbalta  #6 history of hypothyroidism on Synthroid  #7 history of hypertension on cystoscopy at home which we will hold due to hypotension  #8 insomnia continue trazodone.  #9 stage IIIa CKD continue IV fluids and recheck labs in a.m.  #10 anemia of chronic disease hemoglobin 6.9 down from 8 on admission partly from hemodilution.No evidence of active bleeding noted.  Check FOBT.  Blood transfusion ordered.  #10 status post recent right tibia-fibula fracture repair incision clean and dry with 1+ right lower extremity edema venous Doppler negative for DVT however patient refused to have a complete Doppler study done.   Estimated body mass index is 30.94 kg/m as calculated from the following:   Height as of this encounter: 4\' 11"  (1.499 m).   Weight as of this encounter: 69.5 kg.  DVT prophylaxis: Lovenox Code Status: DO NOT RESUSCITATE Family Communication: Discussed with son Disposition Plan:  Status is: Inpatient  Dispo: The patient is from: [facility]              Anticipated d/c is to: [facility]              Anticipated d/c date is: Unknown              Patient currently is not medically stable to d/c.  Admitted with fever colitis n.p.o. needs IV antibiotics follow-up cultures  Consultants: none  Procedures:none Antimicrobials:cipro flagyl  Subjective: Resting in bed in nad confused  Says she is from [facility] and she needs to go back there.  Objective: Vitals:   01/09/20 0800 01/09/20 0801 01/09/20 1203 01/09/20 1228  BP: 115/77 115/77 (!) 130/59 (!) 135/59  Pulse: 80 84 79 75  Resp: 18 16 18 18   Temp: 98.6 F (37 C) 98.6 F (37 C) 98.6 F (37 C) 98.4 F (36.9 C)  TempSrc: Oral Oral Oral Oral  SpO2: 91% 95% 94% 92%  Weight:        Height:        Intake/Output Summary (Last 24 hours) at 01/09/2020 1256 Last data filed at 01/09/2020 1000 Gross per 24 hour  Intake 1182.79 ml  Output 150 ml  Net 1032.79 ml   Filed Weights   01/08/20 2037  Weight: 69.5 kg    Examination:  General exam: Appears calm and comfortable  Respiratory system: Clear to auscultation. Respiratory effort normal. Cardiovascular system: S1 & S2 heard, RRR. No JVD, murmurs, rubs, gallops or clicks. No pedal edema. Gastrointestinal system: Abdomen is nondistended, soft and tender. No organomegaly or masses felt. Normal bowel sounds heard. Central nervous system: Alert and oriented. No focal neurological deficits. Extremities: Right knee incision clean and dry 1+ right lower extremity edema  skin: No rashes, lesions or ulcers Psychiatry: Judgement and insight appear normal. Mood & affect appropriate.     Data Reviewed: I have personally reviewed following labs and imaging studies  CBC: Recent Labs  Lab 01/03/20 0615 01/07/20 0000 01/08/20 1448 01/08/20 2035 01/09/20 0425  WBC 8.5 8.3 15.8* 14.8* 12.6*  NEUTROABS  --  4,856 12.2*  --   --   HGB 8.9* 9.0* 8.4* 8.0* 6.9*  HCT 28.1* 27* 27.4* 25.9* 22.8*  MCV 96.9  --  97.5 95.9 98.7  PLT 177 318 260 144* [id number]   Basic Metabolic Panel: Recent Labs  Lab 01/03/20 0615 01/07/20 0000 01/08/20 1448 01/08/20 2035 01/09/20 0425  NA 140 139 140  --  140  K 3.8 3.9 4.4  --  3.8  CL 106 103 107  --  108  CO2 29 27* 23  --  23  GLUCOSE 101*  --  125*  --  103*  BUN 17 23* 33*  --  27*  CREATININE 1.04* 1.1 1.30* 1.14* 0.98  CALCIUM 9.4  --  9.2  --  8.8*   GFR: Estimated Creatinine Clearance: 34.9 mL/min (by C-G formula based on SCr of 0.98 mg/dL). Liver Function Tests: Recent Labs  Lab 01/07/20 0000 01/08/20 1448 01/09/20 0425  AST 11* 14* 11*  ALT 6* 10 9  ALKPHOS 76 58 49  BILITOT  --  0.7 0.6  PROT  --  5.9* 5.4*  ALBUMIN  --  2.6* 2.5*   No results for input(s):  LIPASE, AMYLASE in the last 168 hours. No results for input(s): AMMONIA in the last 168 hours. Coagulation Profile: Recent Labs  Lab 01/08/20 1448  INR 1.2   Cardiac Enzymes: No results for input(s): CKTOTAL, CKMB, CKMBINDEX, TROPONINI in the last 168 hours. BNP (last 3 results) No results for input(s): PROBNP in the last 8760 hours. HbA1C: No results for input(s): HGBA1C in the last 72 hours. CBG: No results for input(s): GLUCAP in the last 168 hours. Lipid Profile: No results for input(s): CHOL, HDL, LDLCALC, TRIG, CHOLHDL, LDLDIRECT in the last 72 hours. Thyroid Function Tests: No results for input(s): TSH, T4TOTAL, FREET4, T3FREE, THYROIDAB in the last 72 hours. Anemia Panel:  No results for input(s): VITAMINB12, FOLATE, FERRITIN, TIBC, IRON, RETICCTPCT in the last 72 hours. Sepsis Labs: Recent Labs  Lab 01/08/20 1448 01/08/20 1745 01/08/20 2035 01/09/20 0425  PROCALCITON  --   --  33.29 26.61  LATICACIDVEN 1.6 1.4 1.9  --     Recent Results (from the past 240 hour(s))  SARS Coronavirus 2 by RT PCR (hospital order, performed in [facility] hospital lab) Nasopharyngeal Nasopharyngeal Swab     Status: None   Collection Time: 12/31/19  6:52 PM   Specimen: Nasopharyngeal Swab  Result Value Ref Range Status   SARS Coronavirus 2 NEGATIVE NEGATIVE Final    Comment: (NOTE) SARS-CoV-2 target nucleic acids are NOT DETECTED. The SARS-CoV-2 RNA is generally detectable in upper and lower respiratory specimens during the acute phase of infection. The lowest concentration of SARS-CoV-2 viral copies this assay can detect is 250 copies / mL. A negative result does not preclude SARS-CoV-2 infection and should not be used as the sole basis for treatment or other patient management decisions.  A negative result may occur with improper specimen collection / handling, submission of specimen other than nasopharyngeal swab, presence of viral mutation(s) within the areas targeted by this  assay, and inadequate number of viral copies (<250 copies / mL). A negative result must be combined with clinical observations, patient history, and epidemiological information. Fact Sheet for Patients:   StrictlyIdeas.no Fact Sheet for Healthcare Providers: BankingDealers.co.za This test is not yet approved or cleared  by the [place] FDA and has been authorized for detection and/or diagnosis of SARS-CoV-2 by FDA under an Emergency Use Authorization (EUA).  This EUA will remain in effect (meaning this test can be used) for the duration of the COVID-19 declaration under Section 564(b)(1) of the Act, 21 U.S.C. section 360bbb-3(b)(1), unless the authorization is terminated or revoked sooner. Performed at [facility], [place] [address], [place], [place] [address]   Surgical pcr screen     Status: None   Collection Time: 01/01/20 12:32 AM   Specimen: Nasal Mucosa; Nasal Swab  Result Value Ref Range Status   MRSA, PCR NEGATIVE NEGATIVE Final   Staphylococcus aureus NEGATIVE NEGATIVE Final    Comment: (NOTE) The Xpert SA Assay (FDA approved for NASAL specimens in patients 68 years of age and older), is one component of a comprehensive surveillance program. It is not intended to diagnose infection nor to guide or monitor treatment. Performed at [facility], [place] [address], [place], [place] [address]   Blood Culture (routine x 2)     Status: None (Preliminary result)   Collection Time: 01/08/20  2:48 PM   Specimen: BLOOD  Result Value Ref Range Status   Specimen Description   Final    BLOOD LEFT ANTECUBITAL Performed at [facility], [place] [address], [place], [place] [address]    Special Requests   Final    BOTTLES DRAWN AEROBIC AND ANAEROBIC Blood Culture adequate volume Performed at [facility] [address] [place], [place] [address]    Culture   Final    NO GROWTH < 24  HOURS Performed at [facility] [address], [place], [place] [address]    Report Status PENDING  Incomplete  Blood Culture (routine x 2)     Status: None (Preliminary result)   Collection Time: 01/08/20  2:48 PM   Specimen: BLOOD RIGHT FOREARM  Result Value Ref Range Status   Specimen Description   Final  BLOOD RIGHT FOREARM Performed at [facility] [address] [place], [place] [address]    Special Requests   Final    BOTTLES DRAWN AEROBIC AND ANAEROBIC Blood Culture results may not be optimal due to an inadequate volume of blood received in culture bottles   Culture   Final    NO GROWTH < 24 HOURS Performed at [facility] [address], [place], [place] [address]    Report Status PENDING  Incomplete  SARS Coronavirus 2 by RT PCR (hospital order, performed in [facility] hospital lab) Nasopharyngeal Nasopharyngeal Swab     Status: None   Collection Time: 01/08/20  3:02 PM   Specimen: Nasopharyngeal Swab  Result Value Ref Range Status   SARS Coronavirus 2 NEGATIVE NEGATIVE Final    Comment: (NOTE) SARS-CoV-2 target nucleic acids are NOT DETECTED. The SARS-CoV-2 RNA is generally detectable in upper and lower respiratory specimens during the acute phase of infection. The lowest concentration of SARS-CoV-2 viral copies this assay can detect is 250 copies / mL. A negative result does not preclude SARS-CoV-2 infection and should not be used as the sole basis for treatment or other patient management decisions.  A negative result may occur with improper specimen collection / handling, submission of specimen other than nasopharyngeal swab, presence of viral mutation(s) within the areas targeted by this assay, and inadequate number of viral copies (<250 copies / mL). A negative result must be combined with clinical observations, patient history, and epidemiological information. Fact Sheet for Patients:    StrictlyIdeas.no Fact Sheet for Healthcare Providers: BankingDealers.co.za This test is not yet approved or cleared  by the [place] FDA and has been authorized for detection and/or diagnosis of SARS-CoV-2 by FDA under an Emergency Use Authorization (EUA).  This EUA will remain in effect (meaning this test can be used) for the duration of the COVID-19 declaration under Section 564(b)(1) of the Act, 21 U.S.C. section 360bbb-3(b)(1), unless the authorization is terminated or revoked sooner. Performed at [facility], [place] [address], [place],  [address]          Radiology Studies: DG Chest 1 View  Result Date: 01/08/2020 CLINICAL DATA:  Hypoxemia EXAM: CHEST  1 VIEW COMPARISON:  04/04/2019 FINDINGS: Left basilar scarring or atelectasis. Right lung clear. Heart is normal size. No effusions or acute bony abnormality. IMPRESSION: Left base scarring or atelectasis. Electronically Signed   By: Rolm Baptise M.D.   On: 01/08/2020 19:18   DG Tibia/Fibula Right  Result Date: 01/08/2020 CLINICAL DATA:  Patient with fever and lethargy. Recent right tibial surgery. White count. EXAM: RIGHT TIBIA AND FIBULA - 2 VIEW COMPARISON:  Right lower extremity radiograph 01/01/2020 FINDINGS: Status post intramedullary rod and screw fixation for a fracture of the distal tibia. No significant change in fracture alignment compared to prior. Stable appearance of a mildly displaced fracture of the mid fibula. Knee arthroplasty appears intact. Regional soft tissues unremarkable. IMPRESSION: Status post intramedullary rod and screw fixation for a fracture of the distal tibia. Stable appearance of a mid fibular fracture. No significant change in fracture alignment compared to prior. No evidence of hardware complication. Electronically Signed   By: Audie Pinto M.D.   On: 01/08/2020 16:31   CT Abdomen Pelvis W Contrast  Result Date:  01/08/2020 CLINICAL DATA:  Abdominal pain, distension, fever EXAM: CT ABDOMEN AND PELVIS WITH CONTRAST TECHNIQUE: Multidetector CT imaging of the abdomen and pelvis was performed using the standard protocol following  bolus administration of intravenous contrast. CONTRAST:  42mL OMNIPAQUE IOHEXOL 300 MG/ML  SOLN COMPARISON:  None. FINDINGS: Lower chest: Elevation of the left hemidiaphragm with partially visualized consolidation in the posterior left lung base which may reflect atelectasis versus pneumonia. Bandlike atelectasis within the right lung base. Heart size is upper limits of normal. Hepatobiliary: No focal liver abnormality is seen. No gallstones, gallbladder wall thickening, or biliary dilatation. Pancreas: Unremarkable. No pancreatic ductal dilatation or surrounding inflammatory changes. Spleen: Normal in size without focal abnormality. Adrenals/Urinary Tract: Adrenal glands within normal limits. Simple 2.6 cm upper pole right renal cyst. Two renal stones within the inferior right renal calyx, each measuring approximately 9 mm. Single 4 mm stone within the inferior calyx of the left kidney. There is no hydronephrosis. Ureters and urinary bladder appear unremarkable. Stomach/Bowel: Long segment wall thickening involving the mid to distal aspect of the transverse colon to the distal descending colon with hazy pericolonic fat stranding. There are a few prominent diverticula notably at the mid transverse colon. Remainder of the bowel appears within normal limits. A normal appendix is located in the right lower quadrant. No dilated loops of bowel to suggest obstruction. Stomach is within normal limits. Normal positioning of the GE junction. Vascular/Lymphatic: Scattered aortoiliac atherosclerotic calcification without aneurysm. No abdominopelvic lymphadenopathy. Reproductive: Status post hysterectomy. No adnexal masses. Other: No free air, free fluid, focal fluid collection, or abdominal wall hernia.  Musculoskeletal: Prior right hip ORIF. Prior L4-5 fusion. Severe adjacent segment disease at L3-4 with grade 2-3 anterolisthesis. No acute fracture. IMPRESSION: 1. Long segment wall thickening extending from the mid transverse colon to the distal descending colon with hazy pericolonic fat stranding. Findings are suggestive of an infectious or inflammatory colitis. 2. Partially visualized consolidation in the posterior left lung base may reflect atelectasis versus pneumonia. 3. Bilateral nonobstructing nephrolithiasis. 4. Prior L4-5 PLIF with severe adjacent segment disease at L3-4 where there is grade 2-3 anterolisthesis. 5. Aortic atherosclerosis. (ICD10-I70.0). Electronically Signed   By: Davina Poke D.O.   On: 01/08/2020 17:00   DG Abdomen Acute W/Chest  Result Date: 01/08/2020 CLINICAL DATA:  86 year old female with abdominal pain and fever. EXAM: DG ABDOMEN ACUTE W/ 1V CHEST COMPARISON:  Abdominal CT dated 01/08/2020. FINDINGS: There is no bowel dilatation or evidence of obstruction. No free air. Small bilateral renal calculi noted. There is degenerative changes of the spine and lower lumbar fusion as well as partially visualized right femoral neck screw. No acute osseous pathology. Left lung base atelectasis or infiltrate. IMPRESSION: 1. No evidence of bowel obstruction. 2. Bilateral renal calculi. Electronically Signed   By: Anner Crete M.D.   On: 01/08/2020 16:28   VAS [place] LOWER EXTREMITY VENOUS (DVT)  Result Date: 01/09/2020  Lower Venous DVTStudy Indications: Pain, Edema, and post op knee surgery.  Performing Technologist: June Leap RDMS, RVT  Examination Guidelines: A complete evaluation includes B-mode imaging, spectral Doppler, color Doppler, and power Doppler as needed of all accessible portions of each vessel. Bilateral testing is considered an integral part of a complete examination. Limited examinations for reoccurring indications may be performed as noted. The reflux portion of the  exam is performed with the patient in reverse Trendelenburg.  +---------+---------------+---------+-----------+----------+--------------+ RIGHT    CompressibilityPhasicitySpontaneityPropertiesThrombus Aging +---------+---------------+---------+-----------+----------+--------------+ CFV      Full           Yes      Yes                                 +---------+---------------+---------+-----------+----------+--------------+  SFJ      Full                                                        +---------+---------------+---------+-----------+----------+--------------+ FV Prox  Full                                                        +---------+---------------+---------+-----------+----------+--------------+ FV Mid   Full                                                        +---------+---------------+---------+-----------+----------+--------------+ FV DistalFull                                                        +---------+---------------+---------+-----------+----------+--------------+ PFV      Full                                                        +---------+---------------+---------+-----------+----------+--------------+     Summary: RIGHT: - No evidence of deep vein thrombosis in visualized veins. Patient refused remainder of study due to pain.   *See table(s) above for measurements and observations.    Preliminary         Scheduled Meds: . divalproex  250 mg Oral Q12H  . DULoxetine  30 mg Oral Daily  . enoxaparin (LOVENOX) injection  40 mg Subcutaneous Q24H  . levothyroxine  25 mcg Oral Q0600  . LORazepam  0.5 mg Oral QHS  . memantine  10 mg Oral BID  . QUEtiapine  25 mg Oral QHS  . traZODone  50 mg Oral QHS   Continuous Infusions: . sodium chloride Stopped (01/09/20 0517)  . ciprofloxacin 400 mg (01/09/20 0650)  . lactated ringers    . metronidazole 100 mL/hr at 01/09/20 0600     LOS: 1 day     Georgette Shell,  MD  01/09/2020, 12:56 PM

## 2020-01-09 NOTE — Progress Notes (Signed)
PHARMACY NOTE:  ANTIMICROBIAL RENAL DOSAGE ADJUSTMENT  Current antimicrobial regimen includes a mismatch between antimicrobial dosage and estimated renal function.  As per policy approved by the Pharmacy & Therapeutics and Medical Executive Committees, the antimicrobial dosage will be adjusted accordingly.  Current antimicrobial dosage:  Cipro 400mg  IV q24h Indication: colitis  Renal Function:  Estimated Creatinine Clearance: 34.9 mL/min (by C-G formula based on SCr of 0.98 mg/dL). []      On intermittent HD, scheduled: []      On CRRT    Antimicrobial dosage has been changed to:  Cipro 400mg  IV q12h  Additional comments:   Thank you for allowing pharmacy to be a part of this patient's care. Eudelia Bunch, Pharm.D  01/09/2020 8:53 AM

## 2020-01-09 NOTE — Progress Notes (Signed)
Venous duplex partial completed. Patient refused remainder of study due to pain.   Preliminary can be found under CV proc.   June Leap, BS, RDMS, RVT

## 2020-01-10 DIAGNOSIS — K529 Noninfective gastroenteritis and colitis, unspecified: Secondary | ICD-10-CM

## 2020-01-10 LAB — TYPE AND SCREEN
ABO/RH(D): O NEG
Antibody Screen: NEGATIVE
Unit division: 0
Unit division: 0

## 2020-01-10 LAB — CBC
HCT: 29.8 % — ABNORMAL LOW (ref 36.0–46.0)
Hemoglobin: 9.9 g/dL — ABNORMAL LOW (ref 12.0–15.0)
MCH: 29.2 pg (ref 26.0–34.0)
MCHC: 33.2 g/dL (ref 30.0–36.0)
MCV: 87.9 fL (ref 80.0–100.0)
Platelets: 217 10*3/uL (ref 150–400)
RBC: 3.39 MIL/uL — ABNORMAL LOW (ref 3.87–5.11)
RDW: 17.2 % — ABNORMAL HIGH (ref 11.5–15.5)
WBC: 15.1 10*3/uL — ABNORMAL HIGH (ref 4.0–10.5)
nRBC: 0.2 % (ref 0.0–0.2)

## 2020-01-10 LAB — BASIC METABOLIC PANEL
Anion gap: 10 (ref 5–15)
BUN: 19 mg/dL (ref 8–23)
CO2: 23 mmol/L (ref 22–32)
Calcium: 8.6 mg/dL — ABNORMAL LOW (ref 8.9–10.3)
Chloride: 107 mmol/L (ref 98–111)
Creatinine, Ser: 0.85 mg/dL (ref 0.44–1.00)
GFR calc Af Amer: >60 mL/min (ref >60)
GFR calc non Af Amer: >60 mL/min (ref >60)
Glucose, Bld: 90 mg/dL (ref 70–99)
Potassium: 3.8 mmol/L (ref 3.5–5.1)
Sodium: 140 mmol/L (ref 135–145)

## 2020-01-10 LAB — BPAM RBC
Blood Product Expiration Date: 202106092359
Blood Product Expiration Date: 202106122359
ISSUE DATE / TIME: 202106031204
ISSUE DATE / TIME: 202106031735
Unit Type and Rh: 9500
Unit Type and Rh: 9500

## 2020-01-10 LAB — PROCALCITONIN: Procalcitonin: 13.7 ng/mL

## 2020-01-10 MED ORDER — FLUCONAZOLE IN SODIUM CHLORIDE 200-0.9 MG/100ML-% IV SOLN
200.0000 mg | Freq: Once | INTRAVENOUS | Status: AC
  Administered 2020-01-10: 200 mg via INTRAVENOUS
  Filled 2020-01-10: qty 100

## 2020-01-10 NOTE — [place] FL2 (Signed)
MEDICAID FL2 LEVEL OF CARE SCREENING TOOL     IDENTIFICATION  Patient Name: Haley Cohen Birthdate: [birth year] Sex: female Admission Date (Current Location): 01/08/2020  [facility] and [place] Number:  Herbalist and Address:  [facility],  [place], [place]      Provider Number: [id number]  Attending Physician Name and Address:  Georgette Shell, MD  Relative Name and Phone Number:  Leighton Ruff - [phone]    Current Level of Care: Hospital Recommended Level of Care: [place] Prior Approval Number:    Date Approved/Denied:   PASRR Number: [id number]  Discharge Plan: SNF    Current Diagnoses: Patient Active Problem List   Diagnosis Date Noted  . Fever of unknown origin 01/08/2020  . Hypoxemia 01/08/2020  . Colitis 01/08/2020  . Sepsis ([place])   . Tibial fracture 01/01/2020  . Hypercalcemia 12/31/2019  . Right fibular fracture 12/31/2019  . Fracture of tibial shaft, right, closed 12/31/2019  . Fracture of tibial shaft, left, closed 12/31/2019  . Chronic kidney disease, stage 3a 11/11/2019  . Slow transit constipation 09/25/2019  . Blood loss anemia 04/19/2019  . Mixed Alzheimer's and vascular dementia ([place]) 04/09/2019  . Gait abnormality 04/09/2019  . History of fracture of right hip 04/04/2019  . Leukocytosis 04/04/2019  . Essential hypertension 04/04/2019  . Hypothyroidism 04/04/2019  . Edema 07/11/2018  . Insomnia 06/15/2018  . Incontinent of urine 04/27/2018  . Fall 02/02/2018  . B12 deficiency 10/17/2017  . Depression, recurrent ([place]) 10/27/2016  . Osteoarthritis 10/27/2016    Orientation RESPIRATION BLADDER Height & Weight     Self  Normal Incontinent Weight: 153 lb 3.3 oz (69.5 kg) Height:  4\' 11"  (149.9 cm)  BEHAVIORAL SYMPTOMS/MOOD NEUROLOGICAL BOWEL NUTRITION STATUS      Continent Diet(Soft Diet)  AMBULATORY STATUS COMMUNICATION OF NEEDS Skin   Total Care Verbally Surgical  wounds                       Personal Care Assistance Level of Assistance  Bathing, Dressing, Feeding Bathing Assistance: Maximum assistance Feeding assistance: Independent Dressing Assistance: Maximum assistance     Functional Limitations Info  Sight, Hearing, Speech Sight Info: Adequate Hearing Info: Adequate Speech Info: Adequate    SPECIAL CARE FACTORS FREQUENCY  PT (By licensed PT), OT (By licensed OT)     PT Frequency: 5X/WEEK OT Frequency: 5X/WEEK            Contractures Contractures Info: Not present    Additional Factors Info  Code Status, Allergies, Psychotropic Code Status Info: DNR Allergies Info: No known allergies Psychotropic Info: Trazadone, Cymbalta, Depakote, Seroquel         Current Medications (01/10/2020):  This is the current hospital active medication list Current Facility-Administered Medications  Medication Dose Route Frequency Provider Last Rate Last Admin  . 0.9 %  sodium chloride infusion   Intravenous Continuous Georgette Shell, MD 75 mL/hr at 01/10/20 0601 New Bag at 01/10/20 0601  . ciprofloxacin (CIPRO) IVPB 400 mg  400 mg Intravenous BID Georgette Shell, MD 200 mL/hr at 01/10/20 0758 400 mg at 01/10/20 0758  . divalproex (DEPAKOTE) DR tablet 250 mg  250 mg Oral Q12H Georgette Shell, MD   250 mg at 01/10/20 1156  . DULoxetine (CYMBALTA) DR capsule 30 mg  30 mg Oral Daily Georgette Shell, MD   30 mg at 01/10/20 1157  . enoxaparin (LOVENOX) injection 40 mg  40 mg Subcutaneous Q24H Georgette Shell, MD   40 mg at 01/09/20 1736  . lactated ringers bolus 250 mL  250 mL Intravenous Once Davonna Belling, MD      . levothyroxine (SYNTHROID) tablet 25 mcg  25 mcg Oral Q0600 Georgette Shell, MD   25 mcg at 01/10/20 0550  . LORazepam (ATIVAN) tablet 0.5 mg  0.5 mg Oral QHS Georgette Shell, MD   0.5 mg at 01/09/20 2100  . memantine (NAMENDA) tablet 10 mg  10 mg Oral BID Georgette Shell, MD   10 mg at  01/10/20 1157  . metroNIDAZOLE (FLAGYL) IVPB 500 mg  500 mg Intravenous Q8H Georgette Shell, MD 100 mL/hr at 01/10/20 1314 500 mg at 01/10/20 1314  . ondansetron (ZOFRAN-ODT) disintegrating tablet 4 mg  4 mg Oral Q8H PRN Georgette Shell, MD      . QUEtiapine (SEROQUEL) tablet 25 mg  25 mg Oral BID Georgette Shell, MD   25 mg at 01/10/20 1156  . traMADol (ULTRAM) tablet 25 mg  25 mg Oral Q6H PRN Georgette Shell, MD   25 mg at 01/10/20 0444  . traZODone (DESYREL) tablet 50 mg  50 mg Oral QHS Georgette Shell, MD   50 mg at 01/09/20 2100     Discharge Medications: Please see discharge summary for a list of discharge medications.  Relevant Imaging Results:  Relevant Lab Results:   Additional Information SS# [id number]  Lia Hopping, LCSW

## 2020-01-10 NOTE — Care Management Important Message (Signed)
Important Message  Patient Details  Name: Haley Cohen MRN: [id number] Date of Birth: 09-26-1931   Medicare Important Message Given:       Document given to Kathrin Greathouse, LCSW  to give to the patient.   Crista Luria 01/10/2020, 10:26 AM

## 2020-01-10 NOTE — Progress Notes (Signed)
   01/09/20 2300  Charting Type  Charting Type Reassessment  Neurological  Neuro (WDL) X  Orientation Level Disoriented X4  Cognition Memory impairment  Speech Clear  Neuro Symptoms Anxiety;Agitation;Forgetful  Neuro symptoms relieved by Other (Comment) (Nothing relieves symptoms, patient with 1:1 sitter)  Psychosocial  Psychosocial (WDL) X  Patient Behaviors Uncooperative;Anxious;Agitated  Needs Expressed Physical  Emotional support given Given to patient  Neurological  Level of Consciousness Alert    Intervention: 1:1 Sitter   [place] Ritamarie Arkin RN

## 2020-01-10 NOTE — TOC Initial Note (Addendum)
Transition of Care [facility]) - Initial/Assessment Note    Patient Details  Name: Haley Cohen MRN: [id number] Date of Birth: Jul 18, 1932  Transition of Care [facility]) CM/SW Contact:    Lia Hopping, [place] Phone Number: 01/10/2020, 2:07 PM  Clinical Narrative:                 Patient has medical history significant of dementia. Patient had surgery last week for repair of the right tibia and fibula  Patient chief complain this admission fever and ams. Patient is a long term care resident at [facility] and requires max assist for ADL's.. Per son [name], the patient will return at discharge. CSW reached out to the Facilities manager for [employer]. She states the facility will accept weekend discharges.    The FL2 will need to be signed and D/C summary faxed.  If the patient will discharges the week the nurse should call report to: [phone] Ext. [phone], "ask for [facility] Nurse." Patient has to be 24 hours without a sitter.   Expected Discharge Plan: [place]     Patient Goals and CMS Choice Patient states their goals for this hospitalization and ongoing recovery are:: Return to [facility] unit CMS Medicare.gov Compare Post Acute Care list provided to:: ([place])    Expected Discharge Plan and Services Expected Discharge Plan: [place] In-house Referral: Clinical Social Work Discharge Planning Services: CM Consult Post Acute Care Choice: [place] arrangements for the past 2 months: [place]                 DME Arranged: N/A DME Agency: NA       HH Arranged: NA [place] Agency: NA        Prior Living Arrangements/Services Living arrangements for the past 2 months: [place] Lives with:: Facility Resident Patient language and need for interpreter reviewed:: Yes Do you feel safe going back to the place where you live?: Yes      Need for Family  Participation in Patient Care: Yes (Comment) Care giver support system in place?: Yes (comment)   Criminal Activity/Legal Involvement Pertinent to Current Situation/Hospitalization: No - Comment as needed  Activities of Daily Living Home Assistive Devices/Equipment: Other (Comment)(pt unable to verbalize) ADL Screening (condition at time of admission) Patient's cognitive ability adequate to safely complete daily activities?: No Is the patient deaf or have difficulty hearing?: No Does the patient have difficulty seeing, even when wearing glasses/contacts?: No Does the patient have difficulty concentrating, remembering, or making decisions?: Yes Patient able to express need for assistance with ADLs?: No Does the patient have difficulty dressing or bathing?: Yes Independently performs ADLs?: No Communication: Independent Is this a change from baseline?: Pre-admission baseline Dressing (OT): Dependent Is this a change from baseline?: Pre-admission baseline Grooming: Dependent Is this a change from baseline?: Pre-admission baseline Feeding: Dependent Is this a change from baseline?: Pre-admission baseline Bathing: Dependent Is this a change from baseline?: Pre-admission baseline Toileting: Dependent Is this a change from baseline?: Pre-admission baseline In/Out Bed: Dependent Is this a change from baseline?: Pre-admission baseline Walks in Home: Dependent Is this a change from baseline?: Pre-admission baseline Does the patient have difficulty walking or climbing stairs?: Yes Weakness of Legs: Right Weakness of Arms/Hands: None  Permission Sought/Granted Permission sought to share information with : Case Manager       Permission granted to share info w AGENCY: [facility]  granted to share info w Relationship: Leighton Ruff [phone]     Emotional Assessment Appearance:: Appears stated age   Affect (typically observed): Unable to  Assess Orientation: : Oriented to Self Alcohol / Substance Use: Not Applicable Psych Involvement: No (comment)  Admission diagnosis:  Hypoxemia [R09.02] Colitis [K52.9] Sepsis, due to unspecified organism, unspecified whether acute organ dysfunction present [facility]) [A41.9] Patient Active Problem List   Diagnosis Date Noted  . Fever of unknown origin 01/08/2020  . Hypoxemia 01/08/2020  . Colitis 01/08/2020  . Sepsis ([place])   . Tibial fracture 01/01/2020  . Hypercalcemia 12/31/2019  . Right fibular fracture 12/31/2019  . Fracture of tibial shaft, right, closed 12/31/2019  . Fracture of tibial shaft, left, closed 12/31/2019  . Chronic kidney disease, stage 3a 11/11/2019  . Slow transit constipation 09/25/2019  . Blood loss anemia 04/19/2019  . Mixed Alzheimer's and vascular dementia ([place]) 04/09/2019  . Gait abnormality 04/09/2019  . History of fracture of right hip 04/04/2019  . Leukocytosis 04/04/2019  . Essential hypertension 04/04/2019  . Hypothyroidism 04/04/2019  . Edema 07/11/2018  . Insomnia 06/15/2018  . Incontinent of urine 04/27/2018  . Fall 02/02/2018  . B12 deficiency 10/17/2017  . Depression, recurrent ([place]) 10/27/2016  . Osteoarthritis 10/27/2016   PCP:  [name], MD Pharmacy:   [place], [place] [address] [place] [address] Phone: [phone] Fax: [phone]     Social Determinants of Health ([place]) Interventions    Readmission Risk Interventions Readmission Risk Prevention Plan 01/02/2020  Transportation Screening Complete  PCP or Specialist Appt within 5-7 Days Complete  Home Care Screening Complete  Medication Review (RN CM) Complete  Some recent data might be hidden

## 2020-01-10 NOTE — Progress Notes (Signed)
PROGRESS NOTE    Haley Cohen  PYK:[id number] DOB: 03-25-1932 DOA: 01/08/2020 PCP: [name], MD   Brief Narrative:Haley Cohen a 86 y.o.femalewith medical history significant ofdementia, depression hypertension, hypothyroidism admitted with fever. Patient unable to provide me with any history history is obtained from the records. Per records patient was hypoxic with room air saturation 87%. She had right knee surgery by Dr. Doreatha Martin recently. And had increased white count prior to admission. Per patient's son patient had nausea vomiting and diarrhea since yesterday with fever associated with abdominal pain she was more lethargic and more sleeping than usual. She also had leukocytosis prior to admission. Her baseline is can she is confused and she sleeps intermittently. Per son she had hip surgery and knee surgery last week.  ED Course:Patient was started on Vanco and cefepime. Her vital signs where 103/47 with a temp of 101.1 pulse of 70 respiration 23-96% on 2 L.  Assessment & Plan:   Active Problems:   Colitis  #1 sepsis likely secondary to colitis. Patient admitted with fever, tachycardia, hypotension and leukocytosis.   CT abdomen and pelvis-Long segment wall thickening extending from the mid transverse colon to the distal descending colon with hazy pericolonic fat stranding. Findings are suggestive of an infectious or inflammatory colitis. Partially visualized consolidation in the posterior left lung base may reflect atelectasis versus pneumonia. Bilateral nonobstructing nephrolithiasis.  Patient is afebrile, still with leukocytosis. Continue IV Cipro and Flagyl.  #2 leukocytosis likely secondary to colitis. UA leukocytes with 25-30 WBCs.  Urine culture 80,000 colonies of yeast..  Chest x-ray left basilar atelectasis or scarring.  Procalcitonin 33 and 26 continue IV antibiotics.  #3 history of Alzheimer's dementia continue Depakote and Namenda and  Seroquel  #4 goals of care discussed with POA her son [name]. Patient is DNR. Patient has been living in [facility] home for the last 3 years.  Palliative care consulted.  Patient has been agitated and delirious in the setting of dementia and colitis.  At baseline she is confused and has severe dementia.  I have been talking to her son [name] on a daily basis.  If she continues not to eat and drink we will have to consider hospice sooner.  She has been  refusing medications and food.  #5 history of depression on Cymbalta  #6 history of hypothyroidism on Synthroid  #7 history of hypertension on cystoscopy at home which we will hold due to hypotension  #8 insomnia continue trazodone.  #9 stage IIIa CKD continue IV fluids and recheck labs in a.m.  #10 anemia of chronic disease hemoglobin 6.9 down from 8 on admission partly from hemodilution.No evidence of active bleeding noted.  Check FOBT.  Blood transfusion ordered.  #11 status post recent right tibia-fibula fracture repair incision clean and dry with 1+ right lower extremity edema venous Doppler negative for DVT however patient refused to have a complete Doppler study done.   Estimated body mass index is 30.94 kg/m as calculated from the following:   Height as of this encounter: 4\' 11"  (1.499 m).   Weight as of this encounter: 69.5 kg.  DVT prophylaxis: Lovenox Code Status: DO NOT RESUSCITATE Family Communication: Discussed with son Disposition Plan:  Status is: Inpatient  Dispo: The patient is from: [facility]  Anticipated d/c is to: [facility]  Anticipated d/c date is: Unknown  Patient currently is not medically stable to d/c.  Admitted with fever colitis n.p.o. needs IV antibiotics follow-up cultures  Consultants:  Palliative care pending Procedures:none  Antimicrobials:cipro flagyl  Subjective:  Sitter by the bedside reported she was very agitated overnight did not sleep doing  she has been sleeping since this morning and exhausted. Objective: Vitals:   01/10/20 0435 01/10/20 0827 01/10/20 0846 01/10/20 1302  BP: (!) 141/74   (!) 166/76  Pulse: 74   73  Resp: 17   16  Temp: 99 F (37.2 C)   99.1 F (37.3 C)  TempSrc:    Oral  SpO2: 95% (!) 88% 94% 100%  Weight:      Height:        Intake/Output Summary (Last 24 hours) at 01/10/2020 1410 Last data filed at 01/10/2020 1314 Gross per 24 hour  Intake 3365.79 ml  Output 500 ml  Net 2865.79 ml   Filed Weights   01/08/20 2037  Weight: 69.5 kg    Examination:  General exam: Appears calm and comfortable  Respiratory system: Clear to auscultation. Respiratory effort normal. Cardiovascular system: S1 & S2 heard, RRR. No JVD, murmurs, rubs, gallops or clicks. No pedal edema. Gastrointestinal system: Abdomen is nondistended, soft and tender. No organomegaly or masses felt. Normal bowel sounds heard. Central nervous system: Alert and oriented. No focal neurological deficits. Extremities right lower extremity swollen and tender incision on the right knee is clean dry and intact Skin: No rashes, lesions or ulcers Psychiatry: Judgement and insight appear normal. Mood & affect appropriate.     Data Reviewed: I have personally reviewed following labs and imaging studies  CBC: Recent Labs  Lab 01/07/20 0000 01/08/20 1448 01/08/20 2035 01/09/20 0425 01/10/20 0430  WBC 8.3 15.8* 14.8* 12.6* 15.1*  NEUTROABS 4,856 12.2*  --   --   --   HGB 9.0* 8.4* 8.0* 6.9* 9.9*  HCT 27* 27.4* 25.9* 22.8* 29.8*  MCV  --  97.5 95.9 98.7 87.9  PLT 318 260 144* 207 425   Basic Metabolic Panel: Recent Labs  Lab 01/07/20 0000 01/08/20 1448 01/08/20 2035 01/09/20 0425 01/10/20 0430  NA 139 140  --  140 140  K 3.9 4.4  --  3.8 3.8  CL 103 107  --  108 107  CO2 27* 23  --  23 23  GLUCOSE  --  125*  --  103* 90  BUN 23* 33*  --  27* 19  CREATININE 1.1 1.30* 1.14* 0.98 0.85  CALCIUM  --  9.2  --  8.8* 8.6*    GFR: Estimated Creatinine Clearance: 40.3 mL/min (by C-G formula based on SCr of 0.85 mg/dL). Liver Function Tests: Recent Labs  Lab 01/07/20 0000 01/08/20 1448 01/09/20 0425  AST 11* 14* 11*  ALT 6* 10 9  ALKPHOS 76 58 49  BILITOT  --  0.7 0.6  PROT  --  5.9* 5.4*  ALBUMIN  --  2.6* 2.5*   No results for input(s): LIPASE, AMYLASE in the last 168 hours. No results for input(s): AMMONIA in the last 168 hours. Coagulation Profile: Recent Labs  Lab 01/08/20 1448  INR 1.2   Cardiac Enzymes: No results for input(s): CKTOTAL, CKMB, CKMBINDEX, TROPONINI in the last 168 hours. BNP (last 3 results) No results for input(s): PROBNP in the last 8760 hours. HbA1C: No results for input(s): HGBA1C in the last 72 hours. CBG: No results for input(s): GLUCAP in the last 168 hours. Lipid Profile: No results for input(s): CHOL, HDL, LDLCALC, TRIG, CHOLHDL, LDLDIRECT in the last 72 hours. Thyroid Function Tests: No results for input(s): TSH, T4TOTAL, FREET4, T3FREE, THYROIDAB in the last  72 hours. Anemia Panel: No results for input(s): VITAMINB12, FOLATE, FERRITIN, TIBC, IRON, RETICCTPCT in the last 72 hours. Sepsis Labs: Recent Labs  Lab 01/08/20 1448 01/08/20 1745 01/08/20 2035 01/09/20 0425 01/10/20 0430  PROCALCITON  --   --  33.29 26.61 13.70  LATICACIDVEN 1.6 1.4 1.9  --   --     Recent Results (from the past 240 hour(s))  SARS Coronavirus 2 by RT PCR (hospital order, performed in [place] hospital lab) Nasopharyngeal Nasopharyngeal Swab     Status: None   Collection Time: 12/31/19  6:52 PM   Specimen: Nasopharyngeal Swab  Result Value Ref Range Status   SARS Coronavirus 2 NEGATIVE NEGATIVE Final    Comment: (NOTE) SARS-CoV-2 target nucleic acids are NOT DETECTED. The SARS-CoV-2 RNA is generally detectable in upper and lower respiratory specimens during the acute phase of infection. The lowest concentration of SARS-CoV-2 viral copies this assay can detect is  250 copies / mL. A negative result does not preclude SARS-CoV-2 infection and should not be used as the sole basis for treatment or other patient management decisions.  A negative result may occur with improper specimen collection / handling, submission of specimen other than nasopharyngeal swab, presence of viral mutation(s) within the areas targeted by this assay, and inadequate number of viral copies (<250 copies / mL). A negative result must be combined with clinical observations, patient history, and epidemiological information. Fact Sheet for Patients:   StrictlyIdeas.no Fact Sheet for Healthcare Providers: BankingDealers.co.za This test is not yet approved or cleared  by the [place] FDA and has been authorized for detection and/or diagnosis of SARS-CoV-2 by FDA under an Emergency Use Authorization (EUA).  This EUA will remain in effect (meaning this test can be used) for the duration of the COVID-19 declaration under Section 564(b)(1) of the Act, 21 U.S.C. section 360bbb-3(b)(1), unless the authorization is terminated or revoked sooner. Performed at [facility], [place] [address], [place], [place] [address]   Surgical pcr screen     Status: None   Collection Time: 01/01/20 12:32 AM   Specimen: Nasal Mucosa; Nasal Swab  Result Value Ref Range Status   MRSA, PCR NEGATIVE NEGATIVE Final   Staphylococcus aureus NEGATIVE NEGATIVE Final    Comment: (NOTE) The Xpert SA Assay (FDA approved for NASAL specimens in patients 62 years of age and older), is one component of a comprehensive surveillance program. It is not intended to diagnose infection nor to guide or monitor treatment. Performed at [facility], [place] [address] [place], [place] [address]   Blood Culture (routine x 2)     Status: None (Preliminary result)   Collection Time: 01/08/20  2:48 PM   Specimen: BLOOD  Result Value Ref Range Status    Specimen Description   Final    BLOOD LEFT ANTECUBITAL Performed at [facility], [place] [address], [place], [place] [address]    Special Requests   Final    BOTTLES DRAWN AEROBIC AND ANAEROBIC Blood Culture adequate volume Performed at [place] [address], [place],  [address]    Culture   Final    NO GROWTH 2 DAYS Performed at [place] [address] [place],  [address]    Report Status PENDING  Incomplete  Blood Culture (routine x 2)     Status: None (Preliminary result)   Collection Time: 01/08/20  2:48 PM   Specimen: BLOOD RIGHT FOREARM  Result Value Ref  Range Status   Specimen Description   Final    BLOOD RIGHT FOREARM Performed at [place] [address], [place], [place] [address]    Special Requests   Final    BOTTLES DRAWN AEROBIC AND ANAEROBIC Blood Culture results may not be optimal due to an inadequate volume of blood received in culture bottles   Culture   Final    NO GROWTH 2 DAYS Performed at [facility], [place] [address], [place], [place] [address]    Report Status PENDING  Incomplete  SARS Coronavirus 2 by RT PCR (hospital order, performed in [facility] hospital lab) Nasopharyngeal Nasopharyngeal Swab     Status: None   Collection Time: 01/08/20  3:02 PM   Specimen: Nasopharyngeal Swab  Result Value Ref Range Status   SARS Coronavirus 2 NEGATIVE NEGATIVE Final    Comment: (NOTE) SARS-CoV-2 target nucleic acids are NOT DETECTED. The SARS-CoV-2 RNA is generally detectable in upper and lower respiratory specimens during the acute phase of infection. The lowest concentration of SARS-CoV-2 viral copies this assay can detect is 250 copies / mL. A negative result does not preclude SARS-CoV-2 infection and should not be used as the sole basis for treatment or other patient management decisions.  A negative result may occur with improper specimen collection / handling, submission  of specimen other than nasopharyngeal swab, presence of viral mutation(s) within the areas targeted by this assay, and inadequate number of viral copies (<250 copies / mL). A negative result must be combined with clinical observations, patient history, and epidemiological information. Fact Sheet for Patients:   StrictlyIdeas.no Fact Sheet for Healthcare Providers: BankingDealers.co.za This test is not yet approved or cleared  by the [place] FDA and has been authorized for detection and/or diagnosis of SARS-CoV-2 by FDA under an Emergency Use Authorization (EUA).  This EUA will remain in effect (meaning this test can be used) for the duration of the COVID-19 declaration under Section 564(b)(1) of the Act, 21 U.S.C. section 360bbb-3(b)(1), unless the authorization is terminated or revoked sooner. Performed at [facility], [place] [address] [place], [place] [address]   Urine culture     Status: Abnormal   Collection Time: 01/08/20  5:23 PM   Specimen: In/Out Cath Urine  Result Value Ref Range Status   Specimen Description   Final    IN/OUT CATH URINE Performed at [facility] [address] [place], [place] [address]    Special Requests   Final    NONE Performed at [facility] , [place] [address], [place], [place] [address]    Culture 80,000 COLONIES/mL YEAST (A)  Final   Report Status 01/09/2020 FINAL  Final         Radiology Studies: DG Chest 1 View  Result Date: 01/08/2020 CLINICAL DATA:  Hypoxemia EXAM: CHEST  1 VIEW COMPARISON:  04/04/2019 FINDINGS: Left basilar scarring or atelectasis. Right lung clear. Heart is normal size. No effusions or acute bony abnormality. IMPRESSION: Left base scarring or atelectasis. Electronically Signed   By: Rolm Baptise M.D.   On: 01/08/2020 19:18   DG Tibia/Fibula Right  Result Date: 01/08/2020 CLINICAL DATA:  Patient with fever and  lethargy. Recent right tibial surgery. White count. EXAM: RIGHT TIBIA AND FIBULA - 2 VIEW COMPARISON:  Right lower extremity radiograph 01/01/2020 FINDINGS: Status post intramedullary rod and screw fixation for a fracture of the distal tibia. No significant change in fracture alignment compared to prior. Stable  appearance of a mildly displaced fracture of the mid fibula. Knee arthroplasty appears intact. Regional soft tissues unremarkable. IMPRESSION: Status post intramedullary rod and screw fixation for a fracture of the distal tibia. Stable appearance of a mid fibular fracture. No significant change in fracture alignment compared to prior. No evidence of hardware complication. Electronically Signed   By: Audie Pinto M.D.   On: 01/08/2020 16:31   CT Abdomen Pelvis W Contrast  Result Date: 01/08/2020 CLINICAL DATA:  Abdominal pain, distension, fever EXAM: CT ABDOMEN AND PELVIS WITH CONTRAST TECHNIQUE: Multidetector CT imaging of the abdomen and pelvis was performed using the standard protocol following bolus administration of intravenous contrast. CONTRAST:  47mL OMNIPAQUE IOHEXOL 300 MG/ML  SOLN COMPARISON:  None. FINDINGS: Lower chest: Elevation of the left hemidiaphragm with partially visualized consolidation in the posterior left lung base which may reflect atelectasis versus pneumonia. Bandlike atelectasis within the right lung base. Heart size is upper limits of normal. Hepatobiliary: No focal liver abnormality is seen. No gallstones, gallbladder wall thickening, or biliary dilatation. Pancreas: Unremarkable. No pancreatic ductal dilatation or surrounding inflammatory changes. Spleen: Normal in size without focal abnormality. Adrenals/Urinary Tract: Adrenal glands within normal limits. Simple 2.6 cm upper pole right renal cyst. Two renal stones within the inferior right renal calyx, each measuring approximately 9 mm. Single 4 mm stone within the inferior calyx of the left kidney. There is no  hydronephrosis. Ureters and urinary bladder appear unremarkable. Stomach/Bowel: Long segment wall thickening involving the mid to distal aspect of the transverse colon to the distal descending colon with hazy pericolonic fat stranding. There are a few prominent diverticula notably at the mid transverse colon. Remainder of the bowel appears within normal limits. A normal appendix is located in the right lower quadrant. No dilated loops of bowel to suggest obstruction. Stomach is within normal limits. Normal positioning of the GE junction. Vascular/Lymphatic: Scattered aortoiliac atherosclerotic calcification without aneurysm. No abdominopelvic lymphadenopathy. Reproductive: Status post hysterectomy. No adnexal masses. Other: No free air, free fluid, focal fluid collection, or abdominal wall hernia. Musculoskeletal: Prior right hip ORIF. Prior L4-5 fusion. Severe adjacent segment disease at L3-4 with grade 2-3 anterolisthesis. No acute fracture. IMPRESSION: 1. Long segment wall thickening extending from the mid transverse colon to the distal descending colon with hazy pericolonic fat stranding. Findings are suggestive of an infectious or inflammatory colitis. 2. Partially visualized consolidation in the posterior left lung base may reflect atelectasis versus pneumonia. 3. Bilateral nonobstructing nephrolithiasis. 4. Prior L4-5 PLIF with severe adjacent segment disease at L3-4 where there is grade 2-3 anterolisthesis. 5. Aortic atherosclerosis. (ICD10-I70.0). Electronically Signed   By: Davina Poke D.O.   On: 01/08/2020 17:00   DG Abdomen Acute W/Chest  Result Date: 01/08/2020 CLINICAL DATA:  86 year old female with abdominal pain and fever. EXAM: DG ABDOMEN ACUTE W/ 1V CHEST COMPARISON:  Abdominal CT dated 01/08/2020. FINDINGS: There is no bowel dilatation or evidence of obstruction. No free air. Small bilateral renal calculi noted. There is degenerative changes of the spine and lower lumbar fusion as well as  partially visualized right femoral neck screw. No acute osseous pathology. Left lung base atelectasis or infiltrate. IMPRESSION: 1. No evidence of bowel obstruction. 2. Bilateral renal calculi. Electronically Signed   By: Anner Crete M.D.   On: 01/08/2020 16:28   VAS [place] LOWER EXTREMITY VENOUS (DVT)  Result Date: 01/09/2020  Lower Venous DVTStudy Indications: Pain, Edema, and post op knee surgery.  Performing Technologist: June Leap RDMS, RVT  Examination Guidelines: A  complete evaluation includes B-mode imaging, spectral Doppler, color Doppler, and power Doppler as needed of all accessible portions of each vessel. Bilateral testing is considered an integral part of a complete examination. Limited examinations for reoccurring indications may be performed as noted. The reflux portion of the exam is performed with the patient in reverse Trendelenburg.  +---------+---------------+---------+-----------+----------+--------------+ RIGHT    CompressibilityPhasicitySpontaneityPropertiesThrombus Aging +---------+---------------+---------+-----------+----------+--------------+ CFV      Full           Yes      Yes                                 +---------+---------------+---------+-----------+----------+--------------+ SFJ      Full                                                        +---------+---------------+---------+-----------+----------+--------------+ FV Prox  Full                                                        +---------+---------------+---------+-----------+----------+--------------+ FV Mid   Full                                                        +---------+---------------+---------+-----------+----------+--------------+ FV DistalFull                                                        +---------+---------------+---------+-----------+----------+--------------+ PFV      Full                                                         +---------+---------------+---------+-----------+----------+--------------+     Summary: RIGHT: - No evidence of deep vein thrombosis in visualized veins. Patient refused remainder of study due to pain.   *See table(s) above for measurements and observations. Electronically signed by Monica Martinez MD on 01/09/2020 at 4:15:42 PM.    Final         Scheduled Meds: . divalproex  250 mg Oral Q12H  . DULoxetine  30 mg Oral Daily  . enoxaparin (LOVENOX) injection  40 mg Subcutaneous Q24H  . levothyroxine  25 mcg Oral Q0600  . LORazepam  0.5 mg Oral QHS  . memantine  10 mg Oral BID  . QUEtiapine  25 mg Oral BID  . traZODone  50 mg Oral QHS   Continuous Infusions: . sodium chloride 75 mL/hr at 01/10/20 0601  . ciprofloxacin 400 mg (01/10/20 0758)  . lactated ringers    . metronidazole 500 mg (01/10/20 1314)     LOS: 2 days     Georgette Shell, MD 01/10/2020, 2:10  PM

## 2020-01-11 DIAGNOSIS — Z515 Encounter for palliative care: Secondary | ICD-10-CM

## 2020-01-11 DIAGNOSIS — Z7189 Other specified counseling: Secondary | ICD-10-CM

## 2020-01-11 LAB — COMPREHENSIVE METABOLIC PANEL
ALT: 9 U/L (ref 0–44)
AST: 11 U/L — ABNORMAL LOW (ref 15–41)
Albumin: 2.5 g/dL — ABNORMAL LOW (ref 3.5–5.0)
Alkaline Phosphatase: 62 U/L (ref 38–126)
Anion gap: 9 (ref 5–15)
BUN: 14 mg/dL (ref 8–23)
CO2: 21 mmol/L — ABNORMAL LOW (ref 22–32)
Calcium: 8.6 mg/dL — ABNORMAL LOW (ref 8.9–10.3)
Chloride: 110 mmol/L (ref 98–111)
Creatinine, Ser: 0.74 mg/dL (ref 0.44–1.00)
GFR calc Af Amer: >60 mL/min (ref >60)
GFR calc non Af Amer: >60 mL/min (ref >60)
Glucose, Bld: 88 mg/dL (ref 70–99)
Potassium: 3.6 mmol/L (ref 3.5–5.1)
Sodium: 140 mmol/L (ref 135–145)
Total Bilirubin: 0.8 mg/dL (ref 0.3–1.2)
Total Protein: 5.4 g/dL — ABNORMAL LOW (ref 6.5–8.1)

## 2020-01-11 LAB — CBC
HCT: 34 % — ABNORMAL LOW (ref 36.0–46.0)
Hemoglobin: 10.8 g/dL — ABNORMAL LOW (ref 12.0–15.0)
MCH: 29 pg (ref 26.0–34.0)
MCHC: 31.8 g/dL (ref 30.0–36.0)
MCV: 91.2 fL (ref 80.0–100.0)
Platelets: 266 10*3/uL (ref 150–400)
RBC: 3.73 MIL/uL — ABNORMAL LOW (ref 3.87–5.11)
RDW: 17.3 % — ABNORMAL HIGH (ref 11.5–15.5)
WBC: 12.1 10*3/uL — ABNORMAL HIGH (ref 4.0–10.5)
nRBC: 0.2 % (ref 0.0–0.2)

## 2020-01-11 MED ORDER — METRONIDAZOLE IN NACL 5-0.79 MG/ML-% IV SOLN: 500.0000 mg | Freq: Three times a day (TID) | INTRAVENOUS | Status: DC

## 2020-01-11 MED ORDER — METRONIDAZOLE 500 MG PO TABS
500.0000 mg | ORAL_TABLET | Freq: Three times a day (TID) | ORAL | Status: AC
  Administered 2020-01-11: 500 mg via ORAL
  Filled 2020-01-11: qty 1

## 2020-01-11 MED ORDER — CIPROFLOXACIN IN D5W 400 MG/200ML IV SOLN
400.0000 mg | Freq: Two times a day (BID) | INTRAVENOUS | Status: DC
  Filled 2020-01-11 (×2): qty 200

## 2020-01-11 MED ORDER — CIPROFLOXACIN HCL 500 MG PO TABS
500.0000 mg | ORAL_TABLET | Freq: Once | ORAL | Status: AC
  Administered 2020-01-11: 500 mg via ORAL
  Filled 2020-01-11: qty 1

## 2020-01-11 NOTE — Progress Notes (Signed)
Responded for iv team consult for piv restart.  Pt. Refuses at this time.  Staff RN notified.

## 2020-01-11 NOTE — Progress Notes (Signed)
Pt continues to be confused and attempts to get out of bed. Pt also pulls at tubes as well. Rn educating pt and reorienting pt with some success. Pt keeps stating she needs to go home.

## 2020-01-11 NOTE — Plan of Care (Signed)
Care plan reviewed.

## 2020-01-11 NOTE — Progress Notes (Signed)
PROGRESS NOTE    Haley Cohen  BCW:[id number] DOB: [birth year] DOA: 01/08/2020 PCP: [name], MD   Brief Narrative:Dhwani Maceis a 86 y.o.femalewith medical history significant ofdementia, depression hypertension, hypothyroidism admitted with fever. Patient unable to provide me with any history history is obtained from the records. Per records patient was hypoxic with room air saturation 87%. She had right knee surgery by Dr. Doreatha Martin recently. And had increased white count prior to admission. Per patient's son patient had nausea vomiting and diarrhea since yesterday with fever associated with abdominal pain she was more lethargic and more sleeping than usual. She also had leukocytosis prior to admission. Her baseline is can she is confused and she sleeps intermittently. Per son she had hip surgery and knee surgery last week.  ED Course:Patient was started on Vanco and cefepime. Her vital signs where 103/47 with a temp of 101.1 pulse of 70 respiration 23-96% on 2 L.  Assessment & Plan:   Active Problems:   Colitis  #1sepsis likely secondary to colitis. Patient admitted with fever, tachycardia, hypotension and leukocytosis. Still with low-grade temp and leukocytosis and abdominal pain.  Continue IV antibiotics.  CT abdomen and pelvis-Long segment wall thickening extending from the mid transverse colon to the distal descending colon with hazy pericolonic fat stranding. Findings are suggestive of an infectious or inflammatory colitis. Partially visualized consolidation in the posterior left lung base may reflect atelectasis versus pneumonia. Bilateral nonobstructing nephrolithiasis.  #2 leukocytosis likely secondary to colitis. UAleukocytes with 25-30 WBCs. Urine culture 80,000 colonies of yeast.. Received 1 dose of Diflucan. Chest x-ray left basilar atelectasis or scarring.  Procalcitonin 33 and 26 continue IV antibiotics.  #3 history of Alzheimer's  dementia continue Depakote and Namenda and Seroquel  #4 goals of care discussed with POA her son [name]. Patient is DNR. Patient has [facility] living in [facility] for the last 3 years.  Palliative care consulted.  Patient has been agitated and delirious in the setting of dementia and colitis.  At baseline she is confused and has severe dementia.  I have been talking to her son [name] on a daily basis.  If she continues not to eat and drink we will have to consider hospice sooner.  She has been  refusing medications and food.  #5 history of depression on Cymbalta  #6 history of hypothyroidism on Synthroid  #7 history of hypertension on cystoscopy at home which we will hold due to hypotension  #8 insomnia continue trazodone.  #9 stage IIIa CKD continue IV fluids and recheck labs in a.m.  #10 anemia of chronic disease hemoglobin 6.9 down from 8 on admissionpartly from hemodilution.No evidence of active bleeding noted. Check FOBT. Blood transfusion ordered.  #11 status post recent right tibia-fibula fracture repair incision clean and dry with 1+ right lower extremity edema venous Doppler negative for DVT however patient refused to have a complete Doppler study done.   Estimated body mass index is 30.94 kg/m as calculated from the following:   Height as of this encounter: 4\' 11"  (1.499 m).   Weight as of this encounter: 69.5 kg.   DVT prophylaxis:Lovenox Code Status:DO NOT RESUSCITATE Family Communication:Discussed with son Disposition Plan:Status is: Inpatient  Dispo: The [facility] is from:[facility] Anticipated d/c is WT:UUEKCMK home Anticipated d/c date LK:JZPHXTA Patient currentlyis not medically stable to d/c.Admitted with fever colitis n.p.o. needs IV antibiotics follow-up cultures  Consultants: Palliative care pending Procedures:none Antimicrobials:cipro flagyl Subjective:  Patient resting in bed much more awake  but still confused Objective:  Vitals:   01/10/20 0846 01/10/20 1302 01/10/20 2101 01/11/20 0502  BP:  (!) 166/76 (!) 147/91 (!) 162/78  Pulse:  73 70 77  Resp:  16 14 18   Temp:  99.1 F (37.3 C) 97.9 F (36.6 C) 100 F (37.8 C)  TempSrc:  Oral Axillary   SpO2: 94% 100% 96% 90%  Weight:      Height:        Intake/Output Summary (Last 24 hours) at 01/11/2020 1234 Last data filed at 01/11/2020 1030 Gross per 24 hour  Intake 2170.26 ml  Output 0 ml  Net 2170.26 ml   Filed Weights   01/08/20 2037  Weight: 69.5 kg    Examination:  General exam: Appears calm and comfortable  Respiratory system: Clear to auscultation. Respiratory effort normal. Cardiovascular system: S1 & S2 heard, RRR. No JVD, murmurs, rubs, gallops or clicks. No pedal edema. Gastrointestinal system: Abdomen is nondistended, soft and diffuse tender. No organomegaly or masses felt. Normal bowel sounds heard. Central nervous system: Alert and oriented. No focal neurological deficits. Extremities: Right lower extremity edema incision on the right knee clean and dry and intact Skin: No rashes, lesions or ulcers Psychiatry: Judgement and insight appear normal. Mood & affect appropriate.     Data Reviewed: I have personally reviewed following labs and imaging studies  CBC: Recent Labs  Lab 01/07/20 0000 01/07/20 0000 01/08/20 1448 01/08/20 2035 01/09/20 0425 01/10/20 0430 01/11/20 0306  WBC 8.3  --  15.8* 14.8* 12.6* 15.1* 12.1*  NEUTROABS 4,856  --  12.2*  --   --   --   --   HGB 9.0*   < > 8.4* 8.0* 6.9* 9.9* 10.8*  HCT 27*   < > 27.4* 25.9* 22.8* 29.8* 34.0*  MCV  --   --  97.5 95.9 98.7 87.9 91.2  PLT 318   < > 260 144* 207 217 266   < > = values in this interval not displayed.   Basic Metabolic Panel: Recent Labs  Lab 01/07/20 0000 01/08/20 1448 01/08/20 2035 01/09/20 0425 01/10/20 0430 01/11/20 0306  NA 139 140  --  140 140 140  K 3.9 4.4  --  3.8 3.8 3.6  CL 103 107  --  108 107 110    CO2 27* 23  --  23 23 21*  GLUCOSE  --  125*  --  103* 90 88  BUN 23* 33*  --  27* 19 14  CREATININE 1.1 1.30* 1.14* 0.98 0.85 0.74  CALCIUM  --  9.2  --  8.8* 8.6* 8.6*   GFR: Estimated Creatinine Clearance: 42.8 mL/min (by C-G formula based on SCr of 0.74 mg/dL). Liver Function Tests: Recent Labs  Lab 01/07/20 0000 01/08/20 1448 01/09/20 0425 01/11/20 0306  AST 11* 14* 11* 11*  ALT 6* 10 9 9   ALKPHOS 76 58 49 62  BILITOT  --  0.7 0.6 0.8  PROT  --  5.9* 5.4* 5.4*  ALBUMIN  --  2.6* 2.5* 2.5*   No results for input(s): LIPASE, AMYLASE in the last 168 hours. No results for input(s): AMMONIA in the last 168 hours. Coagulation Profile: Recent Labs  Lab 01/08/20 1448  INR 1.2   Cardiac Enzymes: No results for input(s): CKTOTAL, CKMB, CKMBINDEX, TROPONINI in the last 168 hours. BNP (last 3 results) No results for input(s): PROBNP in the last 8760 hours. HbA1C: No results for input(s): HGBA1C in the last 72 hours. CBG: No results for input(s): GLUCAP in the  last 168 hours. Lipid Profile: No results for input(s): CHOL, HDL, LDLCALC, TRIG, CHOLHDL, LDLDIRECT in the last 72 hours. Thyroid Function Tests: No results for input(s): TSH, T4TOTAL, FREET4, T3FREE, THYROIDAB in the last 72 hours. Anemia Panel: No results for input(s): VITAMINB12, FOLATE, FERRITIN, TIBC, IRON, RETICCTPCT in the last 72 hours. Sepsis Labs: Recent Labs  Lab 01/08/20 1448 01/08/20 1745 01/08/20 2035 01/09/20 0425 01/10/20 0430  PROCALCITON  --   --  33.29 26.61 13.70  LATICACIDVEN 1.6 1.4 1.9  --   --     Recent Results (from the past 240 hour(s))  Blood Culture (routine x 2)     Status: None (Preliminary result)   Collection Time: 01/08/20  2:48 PM   Specimen: BLOOD  Result Value Ref Range Status   Specimen Description   Final    BLOOD LEFT ANTECUBITAL Performed at [facility], [place] [address] [place], [place] [address]    Special Requests   Final    BOTTLES DRAWN AEROBIC AND  ANAEROBIC Blood Culture adequate volume Performed at [facility] [address] [place], [place] [address]    Culture   Final    NO GROWTH 3 DAYS Performed at [facility], [place] [address] [place], [place] [address]    Report Status PENDING  Incomplete  Blood Culture (routine x 2)     Status: None (Preliminary result)   Collection Time: 01/08/20  2:48 PM   Specimen: BLOOD RIGHT FOREARM  Result Value Ref Range Status   Specimen Description   Final    BLOOD RIGHT FOREARM Performed at [facility] [address] [place], [place] [address]    Special Requests   Final    BOTTLES DRAWN AEROBIC AND ANAEROBIC Blood Culture results may not be optimal due to an inadequate volume of blood received in culture bottles   Culture   Final    NO GROWTH 3 DAYS Performed at [facility], [place] [address], [place], [place] [address]    Report Status PENDING  Incomplete  SARS Coronavirus 2 by RT PCR (hospital order, performed in [facility] hospital lab) Nasopharyngeal Nasopharyngeal Swab     Status: None   Collection Time: 01/08/20  3:02 PM   Specimen: Nasopharyngeal Swab  Result Value Ref Range Status   SARS Coronavirus 2 NEGATIVE NEGATIVE Final    Comment: (NOTE) SARS-CoV-2 target nucleic acids are NOT DETECTED. The SARS-CoV-2 RNA is generally detectable in upper and lower respiratory specimens during the acute phase of infection. The lowest concentration of SARS-CoV-2 viral copies this assay can detect is 250 copies / mL. A negative result does not preclude SARS-CoV-2 infection and should not be used as the sole basis for treatment or other patient management decisions.  A negative result may occur with improper specimen collection / handling, submission of specimen other than nasopharyngeal swab, presence of viral mutation(s) within the areas targeted by this assay, and inadequate number of viral copies (<250 copies / mL). A negative result  must be combined with clinical observations, patient history, and epidemiological information. Fact Sheet for Patients:   StrictlyIdeas.no Fact Sheet for Healthcare Providers: BankingDealers.co.za This test is not yet approved or cleared  by the [place] FDA and has been authorized for detection and/or diagnosis of SARS-CoV-2 by FDA under an Emergency Use Authorization (EUA).  This EUA will remain in effect (meaning this test can be used) for the duration of the COVID-19 declaration under Section  564(b)(1) of the Act, 21 U.S.C. section 360bbb-3(b)(1), unless the authorization is terminated or revoked sooner. Performed at [facility], [place] [address], [place], [place] [address]   Urine culture     Status: Abnormal   Collection Time: 01/08/20  5:23 PM   Specimen: In/Out Cath Urine  Result Value Ref Range Status   Specimen Description   Final    IN/OUT CATH URINE Performed at [facility] [address] [place], [place] [address]    Special Requests   Final    NONE Performed at [facility], [place] [address] [place], [place] [address]    Culture 80,000 COLONIES/mL YEAST (A)  Final   Report Status 01/09/2020 FINAL  Final         Radiology Studies: No results found.      Scheduled Meds: . divalproex  250 mg Oral Q12H  . DULoxetine  30 mg Oral Daily  . enoxaparin (LOVENOX) injection  40 mg Subcutaneous Q24H  . levothyroxine  25 mcg Oral Q0600  . LORazepam  0.5 mg Oral QHS  . memantine  10 mg Oral BID  . QUEtiapine  25 mg Oral BID  . traZODone  50 mg Oral QHS   Continuous Infusions: . sodium chloride Stopped (01/11/20 0602)  . ciprofloxacin 400 mg (01/11/20 0926)  . lactated ringers    . metronidazole 500 mg (01/11/20 0603)     LOS: 3 days     Georgette Shell, MD 01/11/2020, 12:34 PM

## 2020-01-11 NOTE — Progress Notes (Signed)
Tywan at ph: [phone] with [facility] called for updates.  CSW will continue to follow for D/C needs.  Alphonse Guild. Rajinder Mesick  MSW, LCSW, LCAS, CCS Transitions of Care Clinical Social Worker Care Coordination Department Ph: [phone]

## 2020-01-12 DIAGNOSIS — F0390 Unspecified dementia without behavioral disturbance: Secondary | ICD-10-CM

## 2020-01-12 MED ORDER — CIPROFLOXACIN HCL 500 MG PO TABS
500.0000 mg | ORAL_TABLET | Freq: Two times a day (BID) | ORAL | 0 refills | Status: AC
Start: 2020-01-12 — End: 2020-01-19

## 2020-01-12 MED ORDER — METRONIDAZOLE 500 MG PO TABS: 500.0000 mg | ORAL_TABLET | Freq: Three times a day (TID) | ORAL | 0 refills | Status: DC

## 2020-01-12 NOTE — Progress Notes (Signed)
Pt stable at time of ems transport with ems. No needs at time of transport. Avs sent with pt in packet.

## 2020-01-12 NOTE — Progress Notes (Signed)
Report called to [facility] to [facility] RN without complication. No needs at this time. Family at bedside. Pt remains stable with no needs. EMS has been called. Md to sign [facility] DNR.

## 2020-01-12 NOTE — Discharge Summary (Signed)
Physician Discharge Summary  Haley Cohen HYI:[id number] DOB: 12-30-1931 DOA: 01/08/2020  PCP: [name], MD  Admit date: 01/08/2020 Discharge date: 01/12/2020  Admitted From: Nursing home  disposition: Nursing home Recommendations for Outpatient Follow-up:  1. Follow up with PCP in 1-2 weeks 2. Please obtain BMP/CBC in one week 3. Palliative care follow-up at the facility  Home Health: None Equipment/Devices none Discharge Condition: Stable and improved  CODE STATUS: DO NOT RESUSCITATE  diet recommendation: Cardiac diet Brief/Interim Summary:Haley Maceis a 86 y.o.femalewith medical history significant ofdementia, depression hypertension, hypothyroidism admitted with fever. Patient unable to provide me with any history history is obtained from the records. Per records patient was hypoxic with room air saturation 87%. She had right knee surgery by Dr. Doreatha Martin recently. And had increased white count prior to admission. Per patient's son patient had nausea vomiting and diarrhea since yesterday with fever associated with abdominal pain she was more lethargic and more sleeping than usual. She also had leukocytosis prior to admission. Her baseline is can she is confused and she sleeps intermittently. Per son she had hip surgery and knee surgery last week.  ED Course:Patient was started on Vanco and cefepime. Her vital signs where 103/47 with a temp of 101.1 pulse of 70 respiration 23-96% on 2 L.   Discharge Diagnoses:  Active Problems:   Colitis  #1sepsis likely secondary to colitis. Patient admitted with fever, tachycardia, hypotension and leukocytosis.  Was treated with Cipro and Flagyl during the hospital stay. Patient was afebrile with normal white count and tolerating p.o. intake prior to discharge.  CT abdomen and pelvis-Long segment wall thickening extending from the mid transverse colon to the distal descending colon with hazy pericolonic fat stranding.  Findings are suggestive of an infectious or inflammatory colitis. Partially visualized consolidation in the posterior left lung base may reflect atelectasis versus pneumonia. Bilateral nonobstructing nephrolithiasis.  #2 leukocytosis likely secondary to colitis. UAleukocytes with 25-30 WBCs. Urine culture80,000 colonies of yeast.. Received 1 dose of Diflucan. Chest x-ray left basilar atelectasis or scarring.   #3 history of Alzheimer's dementia continue Depakote and Namenda and Seroquel  #4 goals of care discussed with POA her son [name]. Patient is DNR. Patient has been living in friend's home for the last 3 years.  Please have patient follow-up with palliative care at the facility.   #5 history of depression on Cymbalta  #6 history of hypothyroidism on Synthroid  #7 history of hypertension continue ACE inhibitor.   #8 insomnia continue trazodone.  #9 stage IIIa CKD treated with IV fluids stable.  #10 anemia of chronic disease hemoglobin 6.9 down from 8 on admissionpartly from hemodilution.No evidence of active bleeding noted.   #11status post recent right tibia-fibula fracture repair incision clean and dry with 1+ right lower extremity edema venous Doppler negative for DVT however patient refused to have a complete Doppler study done.    Estimated body mass index is 30.94 kg/m as calculated from the following:   Height as of this encounter: 4\' 11"  (1.499 m).   Weight as of this encounter: 69.5 kg.  Discharge Instructions  Discharge Instructions    Diet - low sodium heart healthy   Complete by: As directed    Increase activity slowly   Complete by: As directed    No wound care   Complete by: As directed      Allergies as of 01/12/2020   No Known Allergies     Medication List    TAKE these medications  acetaminophen 325 MG tablet Commonly known as: TYLENOL Take 650 mg by mouth 2 (two) times daily.   acetaminophen 650 MG suppository Commonly  known as: TYLENOL Place 650 mg rectally every 4 (four) hours as needed for fever.   ascorbic acid 500 MG tablet Commonly known as: VITAMIN C Take 500 mg by mouth daily.   aspirin EC 325 MG tablet Take 1 tablet (325 mg total) by mouth in the morning and at bedtime.   CALCIUM 600 PO Take 1 tablet by mouth daily.   cholecalciferol 25 MCG (1000 UNIT) tablet Commonly known as: VITAMIN D3 Take 2,000 Units by mouth daily.   ciprofloxacin 500 MG tablet Commonly known as: Cipro Take 1 tablet (500 mg total) by mouth 2 (two) times daily for 7 days.   divalproex 250 MG DR tablet Commonly known as: DEPAKOTE Take 250 mg by mouth in the morning and at bedtime.   docusate sodium 100 MG capsule Commonly known as: COLACE Take 1 capsule (100 mg total) by mouth 2 (two) times daily.   DULoxetine 30 MG capsule Commonly known as: CYMBALTA Take 30 mg by mouth daily.   levothyroxine 25 MCG tablet Commonly known as: SYNTHROID Take 25 mcg by mouth daily before breakfast.   lisinopril 10 MG tablet Commonly known as: ZESTRIL Take 10 mg by mouth daily.   LORazepam 0.5 MG tablet Commonly known as: ATIVAN Take 0.5 mg by mouth at bedtime.   memantine 10 MG tablet Commonly known as: NAMENDA Take 10 mg by mouth 2 (two) times daily.   metroNIDAZOLE 500 MG tablet Commonly known as: FLAGYL Take 1 tablet (500 mg total) by mouth every 8 (eight) hours.   ondansetron 4 MG disintegrating tablet Commonly known as: ZOFRAN-ODT Take 4 mg by mouth every 8 (eight) hours as needed for nausea or vomiting.   QUEtiapine 25 MG tablet Commonly known as: SEROQUEL Take 25 mg by mouth at bedtime.   traMADol 50 MG tablet Commonly known as: ULTRAM Take 0.5 tablets (25 mg total) by mouth every 6 (six) hours as needed. What changed: reasons to take this   traZODone 50 MG tablet Commonly known as: DESYREL Take 50 mg by mouth at bedtime.   vitamin B-12 1000 MCG tablet Commonly known as: CYANOCOBALAMIN Take  1,000 mcg by mouth. Once a day on Monday and Wednesday       Contact information for follow-up providers    [name], MD Follow up.   Specialty: Internal Medicine Contact information: [place] [address] [phone]            Contact information for after-discharge care    Destination    [facility] .   Service: Skilled Nursing Contact information: [place] [phone]                 No Known Allergies  Consultations: Palliative care  Procedures/Studies: DG Chest 1 View  Result Date: 01/08/2020 CLINICAL DATA:  Hypoxemia EXAM: CHEST  1 VIEW COMPARISON:  04/04/2019 FINDINGS: Left basilar scarring or atelectasis. Right lung clear. Heart is normal size. No effusions or acute bony abnormality. IMPRESSION: Left base scarring or atelectasis. Electronically Signed   By: Rolm Baptise M.D.   On: 01/08/2020 19:18   DG Tibia/Fibula Right  Result Date: 01/08/2020 CLINICAL DATA:  Patient with fever and lethargy. Recent right tibial surgery. White count. EXAM: RIGHT TIBIA AND FIBULA - 2 VIEW COMPARISON:  Right lower extremity  radiograph 01/01/2020 FINDINGS: Status post intramedullary rod and screw fixation for a fracture of the distal tibia. No significant change in fracture alignment compared to prior. Stable appearance of a mildly displaced fracture of the mid fibula. Knee arthroplasty appears intact. Regional soft tissues unremarkable. IMPRESSION: Status post intramedullary rod and screw fixation for a fracture of the distal tibia. Stable appearance of a mid fibular fracture. No significant change in fracture alignment compared to prior. No evidence of hardware complication. Electronically Signed   By: Audie Pinto M.D.   On: 01/08/2020 16:31   DG Tibia/Fibula Right  Result Date: 01/01/2020 CLINICAL DATA:  Right tibia and fibula fractures. EXAM: RIGHT TIBIA AND FIBULA - 2 VIEW; DG  C-ARM 1-60 MIN COMPARISON:  12/31/2019 FINDINGS: Nine C-arm views of the right lower leg demonstrate intramedullary rod and screw fixation of the previously demonstrated distal tibia fracture with essentially anatomic position and alignment. The previously described fibular shaft fracture is again noted. Stable knee prosthesis. IMPRESSION: Hardware fixation of the previously demonstrated distal tibia fracture. Electronically Signed   By: Claudie Revering M.D.   On: 01/01/2020 13:59   DG Ankle Complete Right  Result Date: 12/31/2019 CLINICAL DATA:  [place].  Tib fib deformity. EXAM: RIGHT ANKLE - COMPLETE 3+ VIEW COMPARISON:  None. FINDINGS: Displaced spiral type fracture of the mid distal tibial shaft and the mid fibular shaft. There is 1 shaft width of lateral and anterior displacement of the tibial fracture with a moderate-sized butterfly fragment. The ankle and hindfoot bony structures are intact. IMPRESSION: Displaced spiral type fractures of the mid distal tibial shaft and the mid fibula. Electronically Signed   By: Marijo Sanes M.D.   On: 12/31/2019 18:51   CT Abdomen Pelvis W Contrast  Result Date: 01/08/2020 CLINICAL DATA:  Abdominal pain, distension, fever EXAM: CT ABDOMEN AND PELVIS WITH CONTRAST TECHNIQUE: Multidetector CT imaging of the abdomen and pelvis was performed using the standard protocol following bolus administration of intravenous contrast. CONTRAST:  2mL OMNIPAQUE IOHEXOL 300 MG/ML  SOLN COMPARISON:  None. FINDINGS: Lower chest: Elevation of the left hemidiaphragm with partially visualized consolidation in the posterior left lung base which may reflect atelectasis versus pneumonia. Bandlike atelectasis within the right lung base. Heart size is upper limits of normal. Hepatobiliary: No focal liver abnormality is seen. No gallstones, gallbladder wall thickening, or biliary dilatation. Pancreas: Unremarkable. No pancreatic ductal dilatation or surrounding inflammatory changes. Spleen: Normal  in size without focal abnormality. Adrenals/Urinary Tract: Adrenal glands within normal limits. Simple 2.6 cm upper pole right renal cyst. Two renal stones within the inferior right renal calyx, each measuring approximately 9 mm. Single 4 mm stone within the inferior calyx of the left kidney. There is no hydronephrosis. Ureters and urinary bladder appear unremarkable. Stomach/Bowel: Long segment wall thickening involving the mid to distal aspect of the transverse colon to the distal descending colon with hazy pericolonic fat stranding. There are a few prominent diverticula notably at the mid transverse colon. Remainder of the bowel appears within normal limits. A normal appendix is located in the right lower quadrant. No dilated loops of bowel to suggest obstruction. Stomach is within normal limits. Normal positioning of the GE junction. Vascular/Lymphatic: Scattered aortoiliac atherosclerotic calcification without aneurysm. No abdominopelvic lymphadenopathy. Reproductive: Status post hysterectomy. No adnexal masses. Other: No free air, free fluid, focal fluid collection, or abdominal wall hernia. Musculoskeletal: Prior right hip ORIF. Prior L4-5 fusion. Severe adjacent segment disease at L3-4 with grade 2-3 anterolisthesis. No acute fracture. IMPRESSION: 1. Long  segment wall thickening extending from the mid transverse colon to the distal descending colon with hazy pericolonic fat stranding. Findings are suggestive of an infectious or inflammatory colitis. 2. Partially visualized consolidation in the posterior left lung base may reflect atelectasis versus pneumonia. 3. Bilateral nonobstructing nephrolithiasis. 4. Prior L4-5 PLIF with severe adjacent segment disease at L3-4 where there is grade 2-3 anterolisthesis. 5. Aortic atherosclerosis. (ICD10-I70.0). Electronically Signed   By: Davina Poke D.O.   On: 01/08/2020 17:00   DG Knee Complete 4 Views Right  Result Date: 12/31/2019 CLINICAL DATA:  Knee pain  after fall EXAM: RIGHT KNEE - COMPLETE 4+ VIEW COMPARISON:  None. FINDINGS: Normal appearance of right total knee arthroplasty. There is a laterally displaced oblique fracture of the midshaft of the right fibula. No knee effusion. IMPRESSION: Laterally displaced oblique fracture of the midshaft of the right fibula. Right total knee arthroplasty without adverse features. Electronically Signed   By: Ulyses Jarred M.D.   On: 12/31/2019 19:39   DG Tibia/Fibula Right Port  Result Date: 01/01/2020 CLINICAL DATA:  Status post right tibial fixation. EXAM: PORTABLE RIGHT TIBIA AND FIBULA - 2 VIEW COMPARISON:  Dec 31, 2019. FINDINGS: Status post intramedullary rod fixation of distal right tibial fracture. Good alignment of fracture components is noted. Stable appearance of mid fibular shaft fracture. IMPRESSION: Status post intramedullary rod fixation of distal right tibial fracture. Electronically Signed   By: Marijo Conception M.D.   On: 01/01/2020 16:08   DG Abdomen Acute W/Chest  Result Date: 01/08/2020 CLINICAL DATA:  86 year old female with abdominal pain and fever. EXAM: DG ABDOMEN ACUTE W/ 1V CHEST COMPARISON:  Abdominal CT dated 01/08/2020. FINDINGS: There is no bowel dilatation or evidence of obstruction. No free air. Small bilateral renal calculi noted. There is degenerative changes of the spine and lower lumbar fusion as well as partially visualized right femoral neck screw. No acute osseous pathology. Left lung base atelectasis or infiltrate. IMPRESSION: 1. No evidence of bowel obstruction. 2. Bilateral renal calculi. Electronically Signed   By: Anner Crete M.D.   On: 01/08/2020 16:28   DG Toe Great Right  Result Date: 12/31/2019 CLINICAL DATA:  Fall with toe pain EXAM: RIGHT GREAT TOE COMPARISON:  None. FINDINGS: There is no evidence of fracture or dislocation. There is no evidence of arthropathy or other focal bone abnormality. Soft tissues are unremarkable. IMPRESSION: Negative. Electronically  Signed   By: Ulyses Jarred M.D.   On: 12/31/2019 19:38   DG C-Arm 1-60 Min  Result Date: 01/01/2020 CLINICAL DATA:  Right tibia and fibula fractures. EXAM: RIGHT TIBIA AND FIBULA - 2 VIEW; DG C-ARM 1-60 MIN COMPARISON:  12/31/2019 FINDINGS: Nine C-arm views of the right lower leg demonstrate intramedullary rod and screw fixation of the previously demonstrated distal tibia fracture with essentially anatomic position and alignment. The previously described fibular shaft fracture is again noted. Stable knee prosthesis. IMPRESSION: Hardware fixation of the previously demonstrated distal tibia fracture. Electronically Signed   By: Claudie Revering M.D.   On: 01/01/2020 13:59   VAS [place] LOWER EXTREMITY VENOUS (DVT)  Result Date: 01/09/2020  Lower Venous DVTStudy Indications: Pain, Edema, and post op knee surgery.  Performing Technologist: June Leap RDMS, RVT  Examination Guidelines: A complete evaluation includes B-mode imaging, spectral Doppler, color Doppler, and power Doppler as needed of all accessible portions of each vessel. Bilateral testing is considered an integral part of a complete examination. Limited examinations for reoccurring indications may be performed as noted. The reflux  portion of the exam is performed with the patient in reverse Trendelenburg.  +---------+---------------+---------+-----------+----------+--------------+ RIGHT    CompressibilityPhasicitySpontaneityPropertiesThrombus Aging +---------+---------------+---------+-----------+----------+--------------+ CFV      Full           Yes      Yes                                 +---------+---------------+---------+-----------+----------+--------------+ SFJ      Full                                                        +---------+---------------+---------+-----------+----------+--------------+ FV Prox  Full                                                         +---------+---------------+---------+-----------+----------+--------------+ FV Mid   Full                                                        +---------+---------------+---------+-----------+----------+--------------+ FV DistalFull                                                        +---------+---------------+---------+-----------+----------+--------------+ PFV      Full                                                        +---------+---------------+---------+-----------+----------+--------------+     Summary: RIGHT: - No evidence of deep vein thrombosis in visualized veins. Patient refused remainder of study due to pain.   *See table(s) above for measurements and observations. Electronically signed by Monica Martinez MD on 01/09/2020 at 4:15:42 PM.    Final     (Echo, Carotid, EGD, Colonoscopy, ERCP)    Subjective:  Patient resting in bed confused at baseline thinks she is in [facility] Discharge Exam: Vitals:   01/11/20 0502 01/11/20 1402  BP: (!) 162/78 (!) 164/96  Pulse: 77 85  Resp: 18 14  Temp: 100 F (37.8 C) 97.9 F (36.6 C)  SpO2: 90% 97%   Vitals:   01/10/20 1302 01/10/20 2101 01/11/20 0502 01/11/20 1402  BP: (!) 166/76 (!) 147/91 (!) 162/78 (!) 164/96  Pulse: 73 70 77 85  Resp: 16 14 18 14   Temp: 99.1 F (37.3 C) 97.9 F (36.6 C) 100 F (37.8 C) 97.9 F (36.6 C)  TempSrc: Oral Axillary    SpO2: 100% 96% 90% 97%  Weight:      Height:        General: Pt is alert, awake, not in acute distress Cardiovascular: RRR, S1/S2 +, no rubs, no gallops Respiratory: CTA bilaterally, no wheezing, no rhonchi  Abdominal: Soft, NT, ND, bowel sounds + Extremities: 2-3+ right lower extremity swelling    The results of significant diagnostics from this hospitalization (including imaging, microbiology, ancillary and laboratory) are listed below for reference.     Microbiology: Recent Results (from the past 240 hour(s))  Blood Culture  (routine x 2)     Status: None (Preliminary result)   Collection Time: 01/08/20  2:48 PM   Specimen: BLOOD  Result Value Ref Range Status   Specimen Description   Final    BLOOD LEFT ANTECUBITAL Performed at [facility], [address], [place], [place] [address]    Special Requests   Final    BOTTLES DRAWN AEROBIC AND ANAEROBIC Blood Culture adequate volume Performed at [facility] [address] [place], [place] [address]    Culture   Final    NO GROWTH 3 DAYS Performed at [facility], [place] [address] [place], [place] [address]    Report Status PENDING  Incomplete  Blood Culture (routine x 2)     Status: None (Preliminary result)   Collection Time: 01/08/20  2:48 PM   Specimen: BLOOD RIGHT FOREARM  Result Value Ref Range Status   Specimen Description   Final    BLOOD RIGHT FOREARM Performed at [facility] [address] [place], [place] [address]    Special Requests   Final    BOTTLES DRAWN AEROBIC AND ANAEROBIC Blood Culture results may not be optimal due to an inadequate volume of blood received in culture bottles   Culture   Final    NO GROWTH 3 DAYS Performed at [facility], [place] [address] [place], [place] [address]    Report Status PENDING  Incomplete  SARS Coronavirus 2 by RT PCR (hospital order, performed in [facility] hospital lab) Nasopharyngeal Nasopharyngeal Swab     Status: None   Collection Time: 01/08/20  3:02 PM   Specimen: Nasopharyngeal Swab  Result Value Ref Range Status   SARS Coronavirus 2 NEGATIVE NEGATIVE Final    Comment: (NOTE) SARS-CoV-2 target nucleic acids are NOT DETECTED. The SARS-CoV-2 RNA is generally detectable in upper and lower respiratory specimens during the acute phase of infection. The lowest concentration of SARS-CoV-2 viral copies this assay can detect is 250 copies / mL. A negative result does not preclude SARS-CoV-2 infection and should not be used as the sole  basis for treatment or other patient management decisions.  A negative result may occur with improper specimen collection / handling, submission of specimen other than nasopharyngeal swab, presence of viral mutation(s) within the areas targeted by this assay, and inadequate number of viral copies (<250 copies / mL). A negative result must be combined with clinical observations, patient history, and epidemiological information. Fact Sheet for Patients:   StrictlyIdeas.no Fact Sheet for Healthcare Providers: BankingDealers.co.za This test is not yet approved or cleared  by the [place] FDA and has been authorized for detection and/or diagnosis of SARS-CoV-2 by FDA under an Emergency Use Authorization (EUA).  This EUA will remain in effect (meaning this test can be used) for the duration of the COVID-19 declaration under Section 564(b)(1) of the Act, 21 U.S.C. section 360bbb-3(b)(1), unless the authorization is terminated or revoked sooner. Performed at [facility], [place] [address], [place], [place] [address]   Urine culture     Status: Abnormal   Collection Time: 01/08/20  5:23 PM   Specimen: In/Out Cath Urine  Result  Value Ref Range Status   Specimen Description   Final    IN/OUT CATH URINE Performed at [facility] [address] [place], [place] [address]    Special Requests   Final    NONE Performed at [facility], [place] [address], [place], [place] [address]    Culture 80,000 COLONIES/mL YEAST (A)  Final   Report Status 01/09/2020 FINAL  Final     Labs: BNP (last 3 results) No results for input(s): BNP in the last 8760 hours. Basic Metabolic Panel: Recent Labs  Lab 01/07/20 0000 01/08/20 1448 01/08/20 2035 01/09/20 0425 01/10/20 0430 01/11/20 0306  NA 139 140  --  140 140 140  K 3.9 4.4  --  3.8 3.8 3.6  CL 103 107  --  108 107 110  CO2 27* 23  --  23 23  21*  GLUCOSE  --  125*  --  103* 90 88  BUN 23* 33*  --  27* 19 14  CREATININE 1.1 1.30* 1.14* 0.98 0.85 0.74  CALCIUM  --  9.2  --  8.8* 8.6* 8.6*   Liver Function Tests: Recent Labs  Lab 01/07/20 0000 01/08/20 1448 01/09/20 0425 01/11/20 0306  AST 11* 14* 11* 11*  ALT 6* 10 9 9   ALKPHOS 76 58 49 62  BILITOT  --  0.7 0.6 0.8  PROT  --  5.9* 5.4* 5.4*  ALBUMIN  --  2.6* 2.5* 2.5*   No results for input(s): LIPASE, AMYLASE in the last 168 hours. No results for input(s): AMMONIA in the last 168 hours. CBC: Recent Labs  Lab 01/07/20 0000 01/07/20 0000 01/08/20 1448 01/08/20 2035 01/09/20 0425 01/10/20 0430 01/11/20 0306  WBC 8.3  --  15.8* 14.8* 12.6* 15.1* 12.1*  NEUTROABS 4,856  --  12.2*  --   --   --   --   HGB 9.0*   < > 8.4* 8.0* 6.9* 9.9* 10.8*  HCT 27*   < > 27.4* 25.9* 22.8* 29.8* 34.0*  MCV  --   --  97.5 95.9 98.7 87.9 91.2  PLT 318   < > 260 144* 207 217 266   < > = values in this interval not displayed.   Cardiac Enzymes: No results for input(s): CKTOTAL, CKMB, CKMBINDEX, TROPONINI in the last 168 hours. BNP: Invalid input(s): POCBNP CBG: No results for input(s): GLUCAP in the last 168 hours. D-Dimer No results for input(s): DDIMER in the last 72 hours. Hgb A1c No results for input(s): HGBA1C in the last 72 hours. Lipid Profile No results for input(s): CHOL, HDL, LDLCALC, TRIG, CHOLHDL, LDLDIRECT in the last 72 hours. Thyroid function studies No results for input(s): TSH, T4TOTAL, T3FREE, THYROIDAB in the last 72 hours.  Invalid input(s): FREET3 Anemia work up No results for input(s): VITAMINB12, FOLATE, FERRITIN, TIBC, IRON, RETICCTPCT in the last 72 hours. Urinalysis    Component Value Date/Time   COLORURINE YELLOW 01/08/2020 1723   APPEARANCEUR CLEAR 01/08/2020 1723   LABSPEC 1.028 01/08/2020 1723   PHURINE 5.0 01/08/2020 1723   GLUCOSEU NEGATIVE 01/08/2020 1723   HGBUR NEGATIVE 01/08/2020 1723   BILIRUBINUR NEGATIVE 01/08/2020 1723    KETONESUR NEGATIVE 01/08/2020 1723   PROTEINUR NEGATIVE 01/08/2020 1723   NITRITE NEGATIVE 01/08/2020 1723   LEUKOCYTESUR SMALL (A) 01/08/2020 1723   Sepsis Labs Invalid input(s): PROCALCITONIN,  WBC,  LACTICIDVEN Microbiology Recent Results (from the past 240 hour(s))  Blood Culture (routine x 2)     Status:  None (Preliminary result)   Collection Time: 01/08/20  2:48 PM   Specimen: BLOOD  Result Value Ref Range Status   Specimen Description   Final    BLOOD LEFT ANTECUBITAL Performed at [facility], [place] [address] [place], [place] [address]    Special Requests   Final    BOTTLES DRAWN AEROBIC AND ANAEROBIC Blood Culture adequate volume Performed at [facility] [address], [place], [place] [address]    Culture   Final    NO GROWTH 3 DAYS Performed at [facility], [place] [address] [place], [place] [address]    Report Status PENDING  Incomplete  Blood Culture (routine x 2)     Status: None (Preliminary result)   Collection Time: 01/08/20  2:48 PM   Specimen: BLOOD RIGHT FOREARM  Result Value Ref Range Status   Specimen Description   Final    BLOOD RIGHT FOREARM Performed at [facility] [address], [place] [address]    Special Requests   Final    BOTTLES DRAWN AEROBIC AND ANAEROBIC Blood Culture results may not be optimal due to an inadequate volume of blood received in culture bottles   Culture   Final    NO GROWTH 3 DAYS Performed at [facility], [place] [address], [place], [place] [address]    Report Status PENDING  Incomplete  SARS Coronavirus 2 by RT PCR (hospital order, performed in [facility] hospital lab) Nasopharyngeal Nasopharyngeal Swab     Status: None   Collection Time: 01/08/20  3:02 PM   Specimen: Nasopharyngeal Swab  Result Value Ref Range Status   SARS Coronavirus 2 NEGATIVE NEGATIVE Final    Comment: (NOTE) SARS-CoV-2 target nucleic acids are NOT DETECTED. The SARS-CoV-2 RNA is  generally detectable in upper and lower respiratory specimens during the acute phase of infection. The lowest concentration of SARS-CoV-2 viral copies this assay can detect is 250 copies / mL. A negative result does not preclude SARS-CoV-2 infection and should not be used as the sole basis for treatment or other patient management decisions.  A negative result may occur with improper specimen collection / handling, submission of specimen other than nasopharyngeal swab, presence of viral mutation(s) within the areas targeted by this assay, and inadequate number of viral copies (<250 copies / mL). A negative result must be combined with clinical observations, patient history, and epidemiological information. Fact Sheet for Patients:   StrictlyIdeas.no Fact Sheet for Healthcare Providers: BankingDealers.co.za This test is not yet approved or cleared  by the [place] FDA and has been authorized for detection and/or diagnosis of SARS-CoV-2 by FDA under an Emergency Use Authorization (EUA).  This EUA will remain in effect (meaning this test can be used) for the duration of the COVID-19 declaration under Section 564(b)(1) of the Act, 21 U.S.C. section 360bbb-3(b)(1), unless the authorization is terminated or revoked sooner. Performed at [facility], [place] [address], [place], [place] [address]   Urine culture     Status: Abnormal   Collection Time: 01/08/20  5:23 PM   Specimen: In/Out Cath Urine  Result Value Ref Range Status   Specimen Description   Final    IN/OUT CATH URINE Performed at [facility] [place] [address] [place], [place] [address]    Special Requests   Final    NONE Performed at [facility], [place] [address] [place], [place] [address]    Culture  80,000 COLONIES/mL YEAST (A)  Final   Report Status 01/09/2020 FINAL  Final     Time coordinating discharge: 39  minutes  SIGNED:   Georgette Shell, MD  [facility] 01/12/2020, 10:59 AM Pager   If 7PM-7AM, please contact night-coverage [email or web address] Password [email or web address]

## 2020-01-12 NOTE — Consult Note (Signed)
Palliative care consult note  Reason for consult: Goals of care in light of colitis and dementia with poor nutritional intake and worsened delirium  Medicare consult received.  Chart reviewed including personal review of pertinent labs and imaging.  Briefly, Haley Cohen is an 86 year old female with past medical history of dementia, depression, hypertension, hypothyroidism who was admitted with a fever and found to have colitis.  She has elevated white count as well as procalcitonin.  She had had nausea, vomiting, and diarrhea with fever prior to admission.  At baseline she is confused and sleep intermittently.  Of note she also had surgery last week following fibular fracture.  Palliative consulted for goals of care as she continues to have poor intake and maintaining nutrition long-term is of great concern.  I saw and examined Haley Cohen today.  She is a an 86 year old female who is lying in bed in no distress but with significant confusion.  She has 2 aides at her bedside at the time of my encounter as she just pulled out her peripheral IV.  In talking with her, she is able to tell me that she is originally from [place] and seemed surprised to know that she is now in [place].  She tells me she does not have any family and that she does not have any children.  States that she wants to go home to her mother.  She then perseverated on being ashamed of drinking alcohol.  She believes that she drank beer earlier today and this is very upsetting to her.  I did my best to assure that she is safe and in the hospital and has not been drinking alcohol.  I called and was able to reach her son, Haley Cohen.  Haley Cohen tells me that his mother has been living in a locked dementia unit at friend's home for several years and does well there other than having confusion and periods where she tries to "escape."  She has 2 children and is originally from [facility].  She was a single mom who worked very hard to raise her  kids.  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  She was raised as a child as a Catholic but this was not a large part of her life after being abandoned by the church when she divorced from her first husband.  I talked with Haley Cohen about her clinical course this admission with colitis and underlying infection and how this can make things worse when it comes to mental status particularly people with dementia.  We discussed overall the changes that she has been having in her nutrition, cognition, and functional status.  One major concern is the fact that she is not eating and drinking enough to maintain herself at this point.  Haley Cohen expressed understanding this concern and wonders if this is going to be a long-term problem or something that may improve with a little more time and treatment for underlying colitis.  Discussed that this is unknown, and we will continue to try to maximize interventions to minimize her delirium, but she is at risk for continued worsening.  -DNR -Continue current interventions.  Her son understands concern about nutrition and continued decline in her overall cognition and functional status.  He would like to see how she continues to progress throughout the weekend and rediscuss on Monday.  If she does not improve to the point where she is able to maintain her own nutrition and hydration, he understands we may be looking in a very limited  prognosis. -Plan for follow-up meeting via phone with son on Monday at 46 AM. -Please call if there are other specific palliative care needs in the interim.  Start time: 1640 End time: 1740 Total time: 60 minutes Greater than 50%  of this time was spent counseling and coordinating care related to the above assessment and plan.  Micheline Rough, MD [place] Team [phone]

## 2020-01-12 NOTE — TOC Transition Note (Signed)
Transition of Care [facility]) - CM/SW Discharge Note   Patient Details  Name: Haley Cohen MRN: [id number] Date of Birth: 1931/12/15  Transition of Care [facility]) CM/SW Contact:  Ova Freshwater Phone Number: [phone] 01/12/2020, 12:31 PM   Clinical Narrative:    Patient will d/c to [facility], [place], Report# [phone], ext [phone], ask for Cedar's RN.  FL2 faxed to [place] attn: [place], [phone].  CSW contacted [facility] for transport, no ETA, CSW notified family and floor RN.   Final next level of care: [place]     Patient Goals and CMS Choice Patient states their goals for this hospitalization and ongoing recovery are:: Return to [facility] unit CMS Medicare.gov Compare Post Acute Care list provided to:: ([place])    Discharge Placement                       Discharge Plan and Services In-house Referral: Clinical Social Work Discharge Planning Services: CM Consult Post Acute Care Choice: [place]          DME Arranged: N/A DME Agency: NA       HH Arranged: NA HH Agency: NA        Social Determinants of Health ([place]) Interventions     Readmission Risk Interventions Readmission Risk Prevention Plan 01/02/2020  Transportation Screening Complete  PCP or Specialist Appt within 5-7 Days Complete  Home Care Screening Complete  Medication Review (RN CM) Complete  Some recent data might be hidden

## 2020-01-12 NOTE — TOC Progression Note (Addendum)
Transition of Care [facility]) - Progression Note    Patient Details  Name: Haley Cohen MRN: [id number] Date of Birth: 06-06-1932  Transition of Care [facility]) CM/SW [place], [place] Phone Number: [phone] 01/12/2020, 10:21 AM  Clinical Narrative:     CSW contacted [place] at [facility], [phone], and confirmed the patient would be able to return once d/c.  Patient will return to Room# 42A, Report# [phone], ext [phone], ask for Cedar's RN.  FL2 faxed to [place] attn: [place], [phone].  Expected Discharge Plan: [place]    Expected Discharge Plan and Services Expected Discharge Plan: [place] In-house Referral: Clinical Social Work Discharge Planning Services: CM Consult Post Acute Care Choice: [place] Living arrangements for the past 2 months: [place]                 DME Arranged: N/A DME Agency: NA       HH Arranged: NA HH Agency: NA         Social Determinants of Health (SDOH) Interventions    Readmission Risk Interventions Readmission Risk Prevention Plan 01/02/2020  Transportation Screening Complete  PCP or Specialist Appt within 5-7 Days Complete  Home Care Screening Complete  Medication Review (RN CM) Complete  Some recent data might be hidden

## 2020-01-13 ENCOUNTER — Non-Acute Institutional Stay (SKILLED_NURSING_FACILITY): Payer: Medicare Other | Admitting: Nurse Practitioner

## 2020-01-13 ENCOUNTER — Encounter: Payer: Self-pay | Admitting: Nurse Practitioner

## 2020-01-13 DIAGNOSIS — D500 Iron deficiency anemia secondary to blood loss (chronic): Secondary | ICD-10-CM | POA: Diagnosis not present

## 2020-01-13 DIAGNOSIS — F0280 Dementia in other diseases classified elsewhere without behavioral disturbance: Secondary | ICD-10-CM

## 2020-01-13 DIAGNOSIS — K529 Noninfective gastroenteritis and colitis, unspecified: Secondary | ICD-10-CM | POA: Diagnosis not present

## 2020-01-13 DIAGNOSIS — S82254D Nondisplaced comminuted fracture of shaft of right tibia, subsequent encounter for closed fracture with routine healing: Secondary | ICD-10-CM | POA: Diagnosis not present

## 2020-01-13 DIAGNOSIS — F0150 Vascular dementia without behavioral disturbance: Secondary | ICD-10-CM | POA: Diagnosis not present

## 2020-01-13 DIAGNOSIS — G309 Alzheimer's disease, unspecified: Secondary | ICD-10-CM

## 2020-01-13 DIAGNOSIS — F339 Major depressive disorder, recurrent, unspecified: Secondary | ICD-10-CM

## 2020-01-13 DIAGNOSIS — I10 Essential (primary) hypertension: Secondary | ICD-10-CM | POA: Diagnosis not present

## 2020-01-13 DIAGNOSIS — E039 Hypothyroidism, unspecified: Secondary | ICD-10-CM | POA: Diagnosis not present

## 2020-01-13 LAB — CULTURE, BLOOD (ROUTINE X 2)
Culture: NO GROWTH
Culture: NO GROWTH
Special Requests: ADEQUATE

## 2020-01-13 NOTE — Assessment & Plan Note (Addendum)
Spoke with HPOA: desires Palliative care, MOST comfort measures, no ABT, IVF for a trial period of time. Continue Memantine 10mg  bid.

## 2020-01-13 NOTE — Progress Notes (Signed)
Daily Progress Note   Patient Name: Haley Cohen       Date: 01/13/2020 DOB: 03/19/1932  Age: 86 y.o. MRN#: [id number] Attending Physician: No att. providers found Primary Care Physician: [name], MD Admit Date: 01/08/2020  Reason for Consultation/Follow-up: Establishing goals of care  Subjective: Received call from Dr. Zigmund Daniel that Haley Cohen is stable for discharge today.  I saw and examined her and met with her son, Haley Cohen, at the bedside.    Haley Cohen is awake and alert and confused, but very pleasant in conversation.  We discussed her clinical course so far this weekend and the fact that she is close to her baseline.  He has discussed with Dr. Rodena Piety and plan is to transition back to assisted living to see how she continues to progress.  I talked with him again about monitoring her nutrition, cognition, and functional status and see how these continue to change over the next couple of weeks when she is transitions back to her home environment.  I recommend palliative care follow-up at assisted living facility to continue to progress conversation on goals of care based upon her clinical course of the next couple of weeks.  He is agreeable to this.  Length of Stay: 4  Physical Exam         General: Alert, awake, in no acute distress. Pleasantly confused HEENT: No bruits, no goiter, no JVD Heart: Regular rate and rhythm. No murmur appreciated. Lungs: Good air movement, clear Abdomen: Soft, nontender, nondistended, positive bowel sounds.  Ext: No significant edema Skin: Warm and dry Neuro: Grossly intact, nonfocal.  Vital Signs: BP (!) 151/70 (BP Location: Right Arm)   Pulse 79   Temp 97.9 F (36.6 C)   Resp 14   Ht 4' 11"  (1.499 m)   Wt 69.5 kg   SpO2 95%   BMI 30.94  kg/m  SpO2: SpO2: 95 % O2 Device: O2 Device: Room Air O2 Flow Rate: O2 Flow Rate (L/min): 2 L/min  Intake/output summary:   Intake/Output Summary (Last 24 hours) at 01/13/2020 1125 Last data filed at 01/12/2020 1357 Gross per 24 hour  Intake 0 ml  Output --  Net 0 ml   LBM: Last BM Date: 01/12/20 Baseline Weight: Weight: 69.5 kg Most recent weight: Weight: 69.5 kg  Palliative Assessment/Data:      Patient Active Problem List   Diagnosis Date Noted  . Fever of unknown origin 01/08/2020  . Hypoxemia 01/08/2020  . Colitis 01/08/2020  . Sepsis ([place])   . Tibial fracture 01/01/2020  . Hypercalcemia 12/31/2019  . Right fibular fracture 12/31/2019  . Fracture of tibial shaft, right, closed 12/31/2019  . Fracture of tibial shaft, left, closed 12/31/2019  . Chronic kidney disease, stage 3a 11/11/2019  . Slow transit constipation 09/25/2019  . Blood loss anemia 04/19/2019  . Mixed Alzheimer's and vascular dementia ([place]) 04/09/2019  . Gait abnormality 04/09/2019  . History of fracture of right hip 04/04/2019  . Leukocytosis 04/04/2019  . Essential hypertension 04/04/2019  . Hypothyroidism 04/04/2019  . Edema 07/11/2018  . Insomnia 06/15/2018  . Incontinent of urine 04/27/2018  . Fall 02/02/2018  . B12 deficiency 10/17/2017  . Depression, recurrent ([place]) 10/27/2016  . Osteoarthritis 10/27/2016    Palliative Care Assessment & Plan   Recommendations/Plan:  Plan for transition back to facility today.  I recommend palliative care follow as an outpatient.  Discussed this with Dr. Zigmund Daniel and patient's son.  Code Status: Code Status History    Date Active Date Inactive Code Status Order ID Comments User Context   01/08/2020 2800 01/12/2020 1912 DNR [id number]  Georgette Shell, MD ED   12/31/2019 2201 01/03/2020 1854 DNR [id number]  Vernelle Emerald, MD ED   12/31/2019 2145 12/31/2019 2201 DNR [id number]  Vernelle Emerald, MD ED   04/04/2019 0548 04/07/2019 1655 Full Code  [id number]  Shela Leff, MD ED   Advance Care Planning Activity    Questions for Most Recent Historical Code Status (Order [id number])    Question Answer Comment   In the event of cardiac or respiratory ARREST Do not call a "code blue"    In the event of cardiac or respiratory ARREST Do not perform Intubation, CPR, defibrillation or ACLS    In the event of cardiac or respiratory ARREST Use medication by any route, position, wound care, and other measures to relive pain and suffering. May use oxygen, suction and manual treatment of airway obstruction as needed for comfort.         Advance Directive Documentation     Most Recent Value  Type of Advance Directive  Healthcare Power of Attorney, Out of facility DNR (pink MOST or yellow form) [per [facility]]  Pre-existing out of facility DNR order (yellow form or pink MOST form)  --  "MOST" Form in Place?  --       Prognosis:   Guarded  Discharge Planning:  Back to facility with palliative care to follow  Care plan was discussed with Son, Dr. Rodena Piety  Thank you for allowing the Palliative Medicine Team to assist in the care of this patient.   Total Time 25 Prolonged Time Billed No      Greater than 50%  of this time was spent counseling and coordinating care related to the above assessment and plan.  Micheline Rough, MD  Please contact Palliative Medicine Team phone at [phone] for questions and concerns.

## 2020-01-13 NOTE — Progress Notes (Signed)
Location:   [facility] Room Number: 42 Place of Service:  SNF (31) Provider: Lennie Odor Treniece Holsclaw NP  [name], MD  Patient Care Team: [name], MD as PCP - General (Internal Medicine) Keyarah Mcroy X, NP as Nurse Practitioner (Internal Medicine) [name], MD (Internal Medicine)  Extended Emergency Contact Information Primary Emergency Contact: [facility] Address: [address]          [place], [place] [address] [name] of [place] Phone: [phone] Relation: Son Secondary Emergency Contact: [facility] Address: [address] [place] [address] [name] Phone: [phone] Relation: Daughter  Code Status: DNR Goals of care: Advanced Directive information Advanced Directives 01/14/2020  Does Patient Have a Medical Advance Directive? Yes  Type of Paramedic of [place];Living will;Out of facility DNR (pink MOST or yellow form)  Does patient want to make changes to medical advance directive? No - Patient declined  Copy of [place] in Chart? Yes - validated most recent copy scanned in chart (See row information)  Would patient like information on creating a medical advance directive? -  Pre-existing out of facility DNR order (yellow form or pink MOST form) -     Chief Complaint  Patient presents with  . Acute Visit    Medication review    HPI:  Pt is a 86 y.o. female seen today for an acute visit for review medications   The patient ws hospitalized on 01/08/20-01/12/20, presenting with fever, tachycardia, hypotension, leukocytosis, treated with  Cipro, Metronidazole for colitis, CT abd showed an infectious or inflammatory colitis, s/p transfusion in hospital,  CBC/BMP one week,   Hx of dementia, advanced stage, on Memantine 10mg  bid,  depression, her mood is managed on Quetiapine 25mg  qd, Trazodone 50mg  qd,  Lorazepam 0.5mg  qhs, Depakote 250mg  bid, Duloxetine  30mg  qd.  HTN, blood pressure is controlled, on Lisinopril 10mg  qd,  hypothyroidism, stable, on Levothyroxine 35mcg qd, s/p IM nailing for tibial fx, pain is managed on prn Tramadol 50mg  q6hr, Tylenol 650mg  bid, ASA 325mg  bid.   Past Medical History:  Diagnosis Date  . Alzheimer disease ([place]) 10/27/2016  . Arthritis   . Confusion 04/04/2019  . Depression, recurrent ([place]) 10/27/2016  . History of fracture of right hip 04/04/2019   07/01/19 Ortho, R hip incision healed, X-ray looks good, s/p IMN R hip fx, WBAT, f/u 6 mos.   Marland Kitchen HTN (hypertension) 10/27/2016  . Hypertension   . Hypertensive kidney disease with CKD (chronic kidney disease) stage V ([place]) 11/02/2016   11/02/16 Na 136, K 4.7, Bun 30, creat 1.52, TSH 0.86, wbc 6.7, Hgb 11.3, plt 264  . Hypothyroidism 10/27/2016  . Osteoarthritis 10/27/2016  . Thyroid disease    Past Surgical History:  Procedure Laterality Date  . ABDOMINAL HYSTERECTOMY  1986  . INTRAMEDULLARY (IM) NAIL INTERTROCHANTERIC Right 04/04/2019   Procedure: INTRAMEDULLARY (IM) NAIL INTERTROCHANTRIC;  Surgeon: Rod Can, MD;  Location: [facility];  Service: Orthopedics;  Laterality: Right;  . KNEE ARTHROSCOPY  2015/2017   Dr. Gustavus Bryant  . SPINE SURGERY  2009, 2011   Dr.Mark Nicoletta Dress  . TIBIA IM NAIL INSERTION Right 01/01/2020   Procedure: INTRAMEDULLARY (IM) NAIL TIBIAL;  Surgeon: Shona Needles, MD;  Location: [facility];  Service: Orthopedics;  Laterality: Right;    No Known Allergies  Allergies as of 01/13/2020   No Known Allergies     Medication List  Accurate as of January 13, 2020 11:59 PM. If you have any questions, ask your nurse or doctor.        acetaminophen 325 MG tablet Commonly known as: TYLENOL Take 650 mg by mouth 2 (two) times daily.   acetaminophen 650 MG suppository Commonly known as: TYLENOL Place 650 mg rectally every 4 (four) hours as needed for fever.   ascorbic acid 500 MG tablet Commonly known as: VITAMIN C Take 500 mg by mouth daily.     aspirin EC 325 MG tablet Take 1 tablet (325 mg total) by mouth in the morning and at bedtime.   CALCIUM 600 PO Take 1 tablet by mouth daily.   cholecalciferol 25 MCG (1000 UNIT) tablet Commonly known as: VITAMIN D3 Take 2,000 Units by mouth daily.   ciprofloxacin 500 MG tablet Commonly known as: Cipro Take 1 tablet (500 mg total) by mouth 2 (two) times daily for 7 days.   divalproex 250 MG DR tablet Commonly known as: DEPAKOTE Take 250 mg by mouth in the morning and at bedtime.   docusate sodium 100 MG capsule Commonly known as: COLACE Take 1 capsule (100 mg total) by mouth 2 (two) times daily.   DULoxetine 30 MG capsule Commonly known as: CYMBALTA Take 30 mg by mouth daily.   levothyroxine 25 MCG tablet Commonly known as: SYNTHROID Take 25 mcg by mouth daily before breakfast.   lisinopril 10 MG tablet Commonly known as: ZESTRIL Take 10 mg by mouth daily.   LORazepam 0.5 MG tablet Commonly known as: ATIVAN Take 0.5 mg by mouth at bedtime.   memantine 10 MG tablet Commonly known as: NAMENDA Take 10 mg by mouth 2 (two) times daily.   metroNIDAZOLE 500 MG tablet Commonly known as: FLAGYL Take 1 tablet (500 mg total) by mouth every 8 (eight) hours.   ondansetron 4 MG disintegrating tablet Commonly known as: ZOFRAN-ODT Take 4 mg by mouth every 8 (eight) hours as needed for nausea or vomiting.   QUEtiapine 25 MG tablet Commonly known as: SEROQUEL Take 25 mg by mouth at bedtime.   saccharomyces boulardii 250 MG capsule Commonly known as: FLORASTOR Take 250 mg by mouth 2 (two) times daily.   traMADol 50 MG tablet Commonly known as: ULTRAM Take 0.5 tablets (25 mg total) by mouth every 6 (six) hours as needed. What changed: reasons to take this   traZODone 50 MG tablet Commonly known as: DESYREL Take 50 mg by mouth at bedtime.   vitamin B-12 1000 MCG tablet Commonly known as: CYANOCOBALAMIN Take 1,000 mcg by mouth. Once a day on Monday and Wednesday        Review of Systems  Constitutional: Positive for activity change, appetite change and fatigue. Negative for chills, diaphoresis and fever.  HENT: Positive for hearing loss. Negative for congestion and voice change.   Eyes: Negative for visual disturbance.  Respiratory: Positive for shortness of breath. Negative for cough and wheezing.        Sat O2 87%  Cardiovascular: Positive for leg swelling.  Gastrointestinal: Positive for diarrhea. Negative for abdominal pain, nausea and vomiting.  Genitourinary: Negative for difficulty urinating, dysuria, frequency and urgency.  Musculoskeletal: Positive for arthralgias and gait problem.       S/p left lower leg IM tibia  Skin: Negative for color change and pallor.  Neurological: Negative for speech difficulty, weakness, light-headedness and headaches.       Dementia, lethargic.   Psychiatric/Behavioral: Positive for agitation, behavioral problems, confusion and sleep disturbance. The  patient is nervous/anxious.     Immunization History  Administered Date(s) Administered  . Influenza-Unspecified 05/29/2017  . Moderna SARS-COVID-2 Vaccination 09/07/2019, 10/05/2019  . Pneumococcal Conjugate-13 06/21/2017  . Pneumococcal Polysaccharide-23 07/16/2018  . Tdap 06/21/2017   Pertinent  Health Maintenance Due  Topic Date Due  . DEXA SCAN  Completed  . PNA vac Low Risk Adult  Completed  . INFLUENZA VACCINE  Discontinued   Fall Risk  04/27/2018 04/25/2017  Falls in the past year? No No   Functional Status Survey:    Vitals:   01/13/20 1518  BP: 130/70  Pulse: 82  Resp: 18  Temp: 98.8 F (37.1 C)  SpO2: 90%  Weight: 154 lb 3.2 oz (69.9 kg)  Height: 4\' 11"  (1.499 m)   Body mass index is 31.14 kg/m. Physical Exam Vitals and nursing note reviewed.  Constitutional:      Comments: Fail, tired appearance.   HENT:     Head: Normocephalic and atraumatic.     Mouth/Throat:     Mouth: Mucous membranes are moist.  Eyes:     Extraocular  Movements: Extraocular movements intact.     Conjunctiva/sclera: Conjunctivae normal.     Pupils: Pupils are equal, round, and reactive to light.  Cardiovascular:     Rate and Rhythm: Normal rate and regular rhythm.     Heart sounds: No murmur heard.   Pulmonary:     Effort: Pulmonary effort is normal.     Breath sounds: No rales.  Abdominal:     General: Bowel sounds are normal.     Palpations: Abdomen is soft.     Tenderness: There is no abdominal tenderness. There is no guarding or rebound.  Musculoskeletal:        General: Tenderness present.     Cervical back: Normal range of motion and neck supple.     Right lower leg: Edema present.     Left lower leg: No edema.     Comments: Brace, s/p IM rod for tibia fx R  Skin:    General: Skin is warm and dry.  Neurological:     General: No focal deficit present.     Mental Status: She is alert.     Gait: Gait abnormal.  Psychiatric:     Comments: Alert, confused.      Labs reviewed: Recent Labs    01/09/20 0425 01/10/20 0430 01/11/20 0306  NA 140 140 140  K 3.8 3.8 3.6  CL 108 107 110  CO2 23 23 21*  GLUCOSE 103* 90 88  BUN 27* 19 14  CREATININE 0.98 0.85 0.74  CALCIUM 8.8* 8.6* 8.6*   Recent Labs    01/08/20 1448 01/09/20 0425 01/11/20 0306  AST 14* 11* 11*  ALT 10 9 9   ALKPHOS 58 49 62  BILITOT 0.7 0.6 0.8  PROT 5.9* 5.4* 5.4*  ALBUMIN 2.6* 2.5* 2.5*   Recent Labs    11/07/19 0000 12/31/19 1900 01/03/20 0615 01/07/20 0000 01/08/20 1448 01/08/20 2035 01/09/20 0425 01/10/20 0430 01/11/20 0306  WBC 5.3   < >   < > 8.3 15.8*   < > 12.6* 15.1* 12.1*  NEUTROABS 2,417  --   --  4,856 12.2*  --   --   --   --   HGB 11.8*   < >   < > 9.0* 8.4*   < > 6.9* 9.9* 10.8*  HCT 36   < >   < > 27* 27.4*   < >  22.8* 29.8* 34.0*  MCV  --    < >   < >  --  97.5   < > 98.7 87.9 91.2  PLT 221   < >   < > 318 260   < > 207 217 266   < > = values in this interval not displayed.   Lab Results  Component Value Date    TSH 2.43 04/11/2019   Lab Results  Component Value Date   HGBA1C 5.3 11/01/2016   No results found for: CHOL, HDL, LDLCALC, LDLDIRECT, TRIG, CHOLHDL  Significant Diagnostic Results in last 30 days:  DG Chest 1 View  Result Date: 01/08/2020 CLINICAL DATA:  Hypoxemia EXAM: CHEST  1 VIEW COMPARISON:  04/04/2019 FINDINGS: Left basilar scarring or atelectasis. Right lung clear. Heart is normal size. No effusions or acute bony abnormality. IMPRESSION: Left base scarring or atelectasis. Electronically Signed   By: Rolm Baptise M.D.   On: 01/08/2020 19:18   DG Tibia/Fibula Right  Result Date: 01/08/2020 CLINICAL DATA:  Patient with fever and lethargy. Recent right tibial surgery. White count. EXAM: RIGHT TIBIA AND FIBULA - 2 VIEW COMPARISON:  Right lower extremity radiograph 01/01/2020 FINDINGS: Status post intramedullary rod and screw fixation for a fracture of the distal tibia. No significant change in fracture alignment compared to prior. Stable appearance of a mildly displaced fracture of the mid fibula. Knee arthroplasty appears intact. Regional soft tissues unremarkable. IMPRESSION: Status post intramedullary rod and screw fixation for a fracture of the distal tibia. Stable appearance of a mid fibular fracture. No significant change in fracture alignment compared to prior. No evidence of hardware complication. Electronically Signed   By: Audie Pinto M.D.   On: 01/08/2020 16:31   DG Tibia/Fibula Right  Result Date: 01/01/2020 CLINICAL DATA:  Right tibia and fibula fractures. EXAM: RIGHT TIBIA AND FIBULA - 2 VIEW; DG C-ARM 1-60 MIN COMPARISON:  12/31/2019 FINDINGS: Nine C-arm views of the right lower leg demonstrate intramedullary rod and screw fixation of the previously demonstrated distal tibia fracture with essentially anatomic position and alignment. The previously described fibular shaft fracture is again noted. Stable knee prosthesis. IMPRESSION: Hardware fixation of the previously  demonstrated distal tibia fracture. Electronically Signed   By: Claudie Revering M.D.   On: 01/01/2020 13:59   DG Ankle Complete Right  Result Date: 12/31/2019 CLINICAL DATA:  [place].  Tib fib deformity. EXAM: RIGHT ANKLE - COMPLETE 3+ VIEW COMPARISON:  None. FINDINGS: Displaced spiral type fracture of the mid distal tibial shaft and the mid fibular shaft. There is 1 shaft width of lateral and anterior displacement of the tibial fracture with a moderate-sized butterfly fragment. The ankle and hindfoot bony structures are intact. IMPRESSION: Displaced spiral type fractures of the mid distal tibial shaft and the mid fibula. Electronically Signed   By: Marijo Sanes M.D.   On: 12/31/2019 18:51   CT Abdomen Pelvis W Contrast  Result Date: 01/08/2020 CLINICAL DATA:  Abdominal pain, distension, fever EXAM: CT ABDOMEN AND PELVIS WITH CONTRAST TECHNIQUE: Multidetector CT imaging of the abdomen and pelvis was performed using the standard protocol following bolus administration of intravenous contrast. CONTRAST:  16mL OMNIPAQUE IOHEXOL 300 MG/ML  SOLN COMPARISON:  None. FINDINGS: Lower chest: Elevation of the left hemidiaphragm with partially visualized consolidation in the posterior left lung base which may reflect atelectasis versus pneumonia. Bandlike atelectasis within the right lung base. Heart size is upper limits of normal. Hepatobiliary: No focal liver abnormality is seen. No gallstones, gallbladder wall  thickening, or biliary dilatation. Pancreas: Unremarkable. No pancreatic ductal dilatation or surrounding inflammatory changes. Spleen: Normal in size without focal abnormality. Adrenals/Urinary Tract: Adrenal glands within normal limits. Simple 2.6 cm upper pole right renal cyst. Two renal stones within the inferior right renal calyx, each measuring approximately 9 mm. Single 4 mm stone within the inferior calyx of the left kidney. There is no hydronephrosis. Ureters and urinary bladder appear unremarkable.  Stomach/Bowel: Long segment wall thickening involving the mid to distal aspect of the transverse colon to the distal descending colon with hazy pericolonic fat stranding. There are a few prominent diverticula notably at the mid transverse colon. Remainder of the bowel appears within normal limits. A normal appendix is located in the right lower quadrant. No dilated loops of bowel to suggest obstruction. Stomach is within normal limits. Normal positioning of the GE junction. Vascular/Lymphatic: Scattered aortoiliac atherosclerotic calcification without aneurysm. No abdominopelvic lymphadenopathy. Reproductive: Status post hysterectomy. No adnexal masses. Other: No free air, free fluid, focal fluid collection, or abdominal wall hernia. Musculoskeletal: Prior right hip ORIF. Prior L4-5 fusion. Severe adjacent segment disease at L3-4 with grade 2-3 anterolisthesis. No acute fracture. IMPRESSION: 1. Long segment wall thickening extending from the mid transverse colon to the distal descending colon with hazy pericolonic fat stranding. Findings are suggestive of an infectious or inflammatory colitis. 2. Partially visualized consolidation in the posterior left lung base may reflect atelectasis versus pneumonia. 3. Bilateral nonobstructing nephrolithiasis. 4. Prior L4-5 PLIF with severe adjacent segment disease at L3-4 where there is grade 2-3 anterolisthesis. 5. Aortic atherosclerosis. (ICD10-I70.0). Electronically Signed   By: Davina Poke D.O.   On: 01/08/2020 17:00   DG Knee Complete 4 Views Right  Result Date: 12/31/2019 CLINICAL DATA:  Knee pain after fall EXAM: RIGHT KNEE - COMPLETE 4+ VIEW COMPARISON:  None. FINDINGS: Normal appearance of right total knee arthroplasty. There is a laterally displaced oblique fracture of the midshaft of the right fibula. No knee effusion. IMPRESSION: Laterally displaced oblique fracture of the midshaft of the right fibula. Right total knee arthroplasty without adverse features.  Electronically Signed   By: Ulyses Jarred M.D.   On: 12/31/2019 19:39   DG Tibia/Fibula Right Port  Result Date: 01/01/2020 CLINICAL DATA:  Status post right tibial fixation. EXAM: PORTABLE RIGHT TIBIA AND FIBULA - 2 VIEW COMPARISON:  Dec 31, 2019. FINDINGS: Status post intramedullary rod fixation of distal right tibial fracture. Good alignment of fracture components is noted. Stable appearance of mid fibular shaft fracture. IMPRESSION: Status post intramedullary rod fixation of distal right tibial fracture. Electronically Signed   By: Marijo Conception M.D.   On: 01/01/2020 16:08   DG Abdomen Acute W/Chest  Result Date: 01/08/2020 CLINICAL DATA:  86 year old female with abdominal pain and fever. EXAM: DG ABDOMEN ACUTE W/ 1V CHEST COMPARISON:  Abdominal CT dated 01/08/2020. FINDINGS: There is no bowel dilatation or evidence of obstruction. No free air. Small bilateral renal calculi noted. There is degenerative changes of the spine and lower lumbar fusion as well as partially visualized right femoral neck screw. No acute osseous pathology. Left lung base atelectasis or infiltrate. IMPRESSION: 1. No evidence of bowel obstruction. 2. Bilateral renal calculi. Electronically Signed   By: Anner Crete M.D.   On: 01/08/2020 16:28   DG Toe Great Right  Result Date: 12/31/2019 CLINICAL DATA:  Fall with toe pain EXAM: RIGHT GREAT TOE COMPARISON:  None. FINDINGS: There is no evidence of fracture or dislocation. There is no evidence of arthropathy or  other focal bone abnormality. Soft tissues are unremarkable. IMPRESSION: Negative. Electronically Signed   By: Ulyses Jarred M.D.   On: 12/31/2019 19:38   DG C-Arm 1-60 Min  Result Date: 01/01/2020 CLINICAL DATA:  Right tibia and fibula fractures. EXAM: RIGHT TIBIA AND FIBULA - 2 VIEW; DG C-ARM 1-60 MIN COMPARISON:  12/31/2019 FINDINGS: Nine C-arm views of the right lower leg demonstrate intramedullary rod and screw fixation of the previously demonstrated distal  tibia fracture with essentially anatomic position and alignment. The previously described fibular shaft fracture is again noted. Stable knee prosthesis. IMPRESSION: Hardware fixation of the previously demonstrated distal tibia fracture. Electronically Signed   By: Claudie Revering M.D.   On: 01/01/2020 13:59   VAS [place] LOWER EXTREMITY VENOUS (DVT)  Result Date: 01/09/2020  Lower Venous DVTStudy Indications: Pain, Edema, and post op knee surgery.  Performing Technologist: June Leap RDMS, RVT  Examination Guidelines: A complete evaluation includes B-mode imaging, spectral Doppler, color Doppler, and power Doppler as needed of all accessible portions of each vessel. Bilateral testing is considered an integral part of a complete examination. Limited examinations for reoccurring indications may be performed as noted. The reflux portion of the exam is performed with the patient in reverse Trendelenburg.  +---------+---------------+---------+-----------+----------+--------------+ RIGHT    CompressibilityPhasicitySpontaneityPropertiesThrombus Aging +---------+---------------+---------+-----------+----------+--------------+ CFV      Full           Yes      Yes                                 +---------+---------------+---------+-----------+----------+--------------+ SFJ      Full                                                        +---------+---------------+---------+-----------+----------+--------------+ FV Prox  Full                                                        +---------+---------------+---------+-----------+----------+--------------+ FV Mid   Full                                                        +---------+---------------+---------+-----------+----------+--------------+ FV DistalFull                                                        +---------+---------------+---------+-----------+----------+--------------+ PFV      Full                                                         +---------+---------------+---------+-----------+----------+--------------+     Summary: RIGHT: - No evidence of deep vein thrombosis in visualized  veins. Patient refused remainder of study due to pain.   *See table(s) above for measurements and observations. Electronically signed by Monica Martinez MD on 01/09/2020 at 4:15:42 PM.    Final     Assessment/Plan: Mixed Alzheimer's and vascular dementia ([place]) Spoke with HPOA: desires Palliative care, MOST comfort measures, no ABT, IVF for a trial period of time. Continue Memantine 10mg  bid.   Essential hypertension Blood pressure is controlled, continue Lisinopril.   Colitis hospitalized 01/08/20-01/12/20, presented to ED  with fever, tachycardia, hypotension, leukocytosis, treated with  Cipro, Metronidazole for colitis, CT abd showed an infectious or inflammatory colitis,   Hypothyroidism Stable, TSH 2.43 04/11/19, continue Levothyroxine  Fracture of tibial shaft, right, closed  s/p IM nailing for tibial fx, pain is managed, continue  prn Tramadol 50mg  q6hr, Tylenol 650mg  bid, ASA 325mg  bid.   Blood loss anemia S/p transfusion, Hgb 10.8 01/11/20  Depression, recurrent ([facility]) Her mood is managed,  continue Quetiapine 25mg  qd, Trazodone 50mg  qd,  Lorazepam 0.5mg  qhs, Depakote 250mg  bid, Duloxetine 30mg  qd.    Family/ staff Communication: plan of care reviewed with the patient and charge nurse.   Labs/tests ordered:  None  Time spend 35 minutes.

## 2020-01-14 ENCOUNTER — Other Ambulatory Visit: Payer: Self-pay | Admitting: *Deleted

## 2020-01-14 ENCOUNTER — Encounter: Payer: Self-pay | Admitting: Internal Medicine

## 2020-01-14 ENCOUNTER — Non-Acute Institutional Stay (SKILLED_NURSING_FACILITY): Payer: Medicare Other | Admitting: Internal Medicine

## 2020-01-14 DIAGNOSIS — K529 Noninfective gastroenteritis and colitis, unspecified: Secondary | ICD-10-CM | POA: Diagnosis not present

## 2020-01-14 DIAGNOSIS — S82242S Displaced spiral fracture of shaft of left tibia, sequela: Secondary | ICD-10-CM

## 2020-01-14 DIAGNOSIS — N1831 Chronic kidney disease, stage 3a: Secondary | ICD-10-CM

## 2020-01-14 DIAGNOSIS — E039 Hypothyroidism, unspecified: Secondary | ICD-10-CM | POA: Diagnosis not present

## 2020-01-14 DIAGNOSIS — F5105 Insomnia due to other mental disorder: Secondary | ICD-10-CM | POA: Diagnosis not present

## 2020-01-14 DIAGNOSIS — I10 Essential (primary) hypertension: Secondary | ICD-10-CM | POA: Diagnosis not present

## 2020-01-14 DIAGNOSIS — L03115 Cellulitis of right lower limb: Secondary | ICD-10-CM | POA: Diagnosis not present

## 2020-01-14 DIAGNOSIS — F99 Mental disorder, not otherwise specified: Secondary | ICD-10-CM | POA: Diagnosis not present

## 2020-01-14 DIAGNOSIS — R600 Localized edema: Secondary | ICD-10-CM

## 2020-01-14 MED ORDER — LORAZEPAM 0.5 MG PO TABS: 0.5000 mg | ORAL_TABLET | Freq: Every day | ORAL | 0 refills | Status: DC

## 2020-01-14 NOTE — Progress Notes (Signed)
Provider:  Veleta Miners, MD Location:   [facility] Room Number: 42-A Place of Service:  SNF (31)  PCP: [name], MD Patient Care Team: [name], MD as PCP - General (Internal Medicine) Mast, Man X, NP as Nurse Practitioner (Internal Medicine) [name], MD (Internal Medicine)  Extended Emergency Contact Information Primary Emergency Contact: [facility] Address: [address]          [place], [place] [address] [name] of [place] Phone: [phone] Relation: Son Secondary Emergency Contact: [facility] Address: [address] [place] [address] [name] Phone: [phone] Relation: Daughter  Code Status: DNR Goals of Care: Advanced Directive information Advanced Directives 01/14/2020  Does Patient Have a Medical Advance Directive? Yes  Type of Paramedic of [place];Living will;Out of facility DNR (pink MOST or yellow form)  Does patient want to make changes to medical advance directive? No - Patient declined  Copy of [place] in Chart? Yes - validated most recent copy scanned in chart (See row information)  Would patient like information on creating a medical advance directive? -  Pre-existing out of facility DNR order (yellow form or pink MOST form) -      Chief Complaint  Patient presents with  . New Admit To SNF    Admission    HPI: Patient is a 86 y.o. female seen today for Readmission to SNF for therapy and Long term Care Was admitted in the hospital from 6/2 to 6/6 for sepsis secondary to colitis.  Was also admitted in the Hospital from 5/25-5/28 for Right Fibula Tibia Fractures s/p IM rod Placement Patient also  has h/o Hypertension, Hypothyroidism, Alzheimer's Dementia with behavior issues, Depression, B12 def, S/P Right Hip fracture with Intramedullary Fixation in 8/27  Patient was sent to the hospital for nausea vomiting  abdominal pain fever tachycardia leukocytosis and low blood pressure.  She was treated IV fluids.  Her work-up included CT scan of the abdomen which showed signs of colitis.  Her UA was negative except yeast which was treated with 1 dose of Diflucan.  The CT also showed some atelectasis in the left lower area possible pneumonia She did respond to Cipro and Flagyl.  Is now discharged on 7 days of treatment Patient was also was found to have anemia with hemoglobin of 6.9.  She got transfusion. No evidence of any bleeding She also had Dopplers done for her lower extremities which were negative for any DVT  Patient is back in the facility.  The nurses noticed that she had redness and swelling in her right leg. She has a brace on it is causing her to have the maceration of the skin especially in the bottom. She did have swelling in the leg She denies any cough shortness of breath pain.  Sitting comfortably in wheelchair.  Unable to lift her leg due to her risk of fall.      Past Medical History:  Diagnosis Date  . Alzheimer disease ([name]) 10/27/2016  . Arthritis   . Confusion 04/04/2019  . Depression, recurrent ([place]) 10/27/2016  . History of fracture of right hip 04/04/2019   07/01/19 Ortho, R hip incision healed, X-ray looks good, s/p IMN R hip fx, WBAT, f/u 6 mos.   Marland Kitchen HTN (hypertension) 10/27/2016  . Hypertension   . Hypertensive kidney disease with CKD (chronic kidney disease) stage V ([place]) 11/02/2016  11/02/16 Na 136, K 4.7, Bun 30, creat 1.52, TSH 0.86, wbc 6.7, Hgb 11.3, plt 264  . Hypothyroidism 10/27/2016  . Osteoarthritis 10/27/2016  . Thyroid disease    Past Surgical History:  Procedure Laterality Date  . ABDOMINAL HYSTERECTOMY  1986  . INTRAMEDULLARY (IM) NAIL INTERTROCHANTERIC Right 04/04/2019   Procedure: INTRAMEDULLARY (IM) NAIL INTERTROCHANTRIC;  Surgeon: Rod Can, MD;  Location: [facility];  Service: Orthopedics;  Laterality: Right;  . KNEE ARTHROSCOPY  2015/2017   Dr.  Gustavus Bryant  . SPINE SURGERY  2009, 2011   Dr.Mark Nicoletta Dress  . TIBIA IM NAIL INSERTION Right 01/01/2020   Procedure: INTRAMEDULLARY (IM) NAIL TIBIAL;  Surgeon: Shona Needles, MD;  Location: [facility];  Service: Orthopedics;  Laterality: Right;    reports that she has never smoked. She has never used smokeless tobacco. She reports previous alcohol use. She reports that she does not use drugs. Social History   Socioeconomic History  . Marital status: Single    Spouse name: Not on file  . Number of children: Not on file  . Years of education: Not on file  . Highest education level: Not on file  Occupational History  . Not on file  Tobacco Use  . Smoking status: Never Smoker  . Smokeless tobacco: Never Used  Substance and Sexual Activity  . Alcohol use: Not Currently  . Drug use: Never  . Sexual activity: Not Currently  Other Topics Concern  . Not on file  Social History Narrative   ** Merged History Encounter **       Admitted to [facility] 10/21/16 Widowed Never smoked Alcohol none POA/DNR     Social Determinants of Health   Financial Resource Strain:   . Difficulty of Paying Living Expenses:   Food Insecurity:   . Worried About Charity fundraiser in the Last Year:   . Arboriculturist in the Last Year:   Transportation Needs:   . Film/video editor (Medical):   Marland Kitchen Lack of Transportation (Non-Medical):   Physical Activity:   . Days of Exercise per Week:   . Minutes of Exercise per Session:   Stress:   . Feeling of Stress :   Social Connections:   . Frequency of Communication with Friends and Family:   . Frequency of Social Gatherings with Friends and Family:   . Attends Religious Services:   . Active Member of Clubs or Organizations:   . Attends Archivist Meetings:   Marland Kitchen Marital Status:   Intimate Partner Violence:   . Fear of Current or Ex-Partner:   . Emotionally Abused:   Marland Kitchen Physically Abused:   . Sexually Abused:     Functional Status  Survey:    Family History  Problem Relation Age of Onset  . Cancer Mother     Health Maintenance  Topic Date Due  . Samul Dada  06/22/2027  . DEXA SCAN  Completed  . COVID-19 Vaccine  Completed  . PNA vac Low Risk Adult  Completed  . INFLUENZA VACCINE  Discontinued    No Known Allergies  Allergies as of 01/14/2020   No Known Allergies     Medication List       Accurate as of January 14, 2020  1:40 PM. If you have any questions, ask your nurse or doctor.        acetaminophen 325 MG tablet Commonly known as: TYLENOL Take 650 mg by mouth 2 (two) times daily.   acetaminophen  650 MG suppository Commonly known as: TYLENOL Place 650 mg rectally every 4 (four) hours as needed for fever.   ascorbic acid 500 MG tablet Commonly known as: VITAMIN C Take 500 mg by mouth daily.   aspirin EC 325 MG tablet Take 1 tablet (325 mg total) by mouth in the morning and at bedtime.   CALCIUM 600 PO Take 1 tablet by mouth daily.   cholecalciferol 25 MCG (1000 UNIT) tablet Commonly known as: VITAMIN D3 Take 2,000 Units by mouth daily.   ciprofloxacin 500 MG tablet Commonly known as: Cipro Take 1 tablet (500 mg total) by mouth 2 (two) times daily for 7 days.   divalproex 250 MG DR tablet Commonly known as: DEPAKOTE Take 250 mg by mouth in the morning and at bedtime.   docusate sodium 100 MG capsule Commonly known as: COLACE Take 1 capsule (100 mg total) by mouth 2 (two) times daily.   DULoxetine 30 MG capsule Commonly known as: CYMBALTA Take 30 mg by mouth daily.   levothyroxine 25 MCG tablet Commonly known as: SYNTHROID Take 25 mcg by mouth daily before breakfast.   lisinopril 10 MG tablet Commonly known as: ZESTRIL Take 10 mg by mouth daily.   LORazepam 0.5 MG tablet Commonly known as: ATIVAN Take 0.5 mg by mouth at bedtime.   memantine 10 MG tablet Commonly known as: NAMENDA Take 10 mg by mouth 2 (two) times daily.   metroNIDAZOLE 500 MG tablet Commonly known  as: FLAGYL Take 1 tablet (500 mg total) by mouth every 8 (eight) hours.   ondansetron 4 MG disintegrating tablet Commonly known as: ZOFRAN-ODT Take 4 mg by mouth every 8 (eight) hours as needed for nausea or vomiting.   QUEtiapine 25 MG tablet Commonly known as: SEROQUEL Take 25 mg by mouth at bedtime.   saccharomyces boulardii 250 MG capsule Commonly known as: FLORASTOR Take 250 mg by mouth 2 (two) times daily.   traMADol 50 MG tablet Commonly known as: ULTRAM Take 0.5 tablets (25 mg total) by mouth every 6 (six) hours as needed.   traZODone 50 MG tablet Commonly known as: DESYREL Take 50 mg by mouth at bedtime.   vitamin B-12 1000 MCG tablet Commonly known as: CYANOCOBALAMIN Take 1,000 mcg by mouth. Once a day on Monday and Wednesday       Review of Systems  Unable to perform ROS: Dementia    Vitals:   01/14/20 1334  BP: 138/80  Pulse: 83  Resp: 18  Temp: 97.8 F (36.6 C)  SpO2: 95%  Weight: 154 lb 3.2 oz (69.9 kg)  Height: 4\' 11"  (1.499 m)   Body mass index is 31.14 kg/m. Physical Exam Vitals reviewed.  Constitutional:      Appearance: Normal appearance.  HENT:     Head: Normocephalic.     Nose: Nose normal.     Mouth/Throat:     Mouth: Mucous membranes are moist.     Pharynx: Oropharynx is clear.  Eyes:     Pupils: Pupils are equal, round, and reactive to light.  Cardiovascular:     Rate and Rhythm: Normal rate and regular rhythm.     Pulses: Normal pulses.  Pulmonary:     Effort: Pulmonary effort is normal. No respiratory distress.     Breath sounds: Normal breath sounds. No wheezing.  Abdominal:     General: Abdomen is flat. Bowel sounds are normal. There is no distension.     Palpations: Abdomen is soft.     Tenderness:  There is no abdominal tenderness.  Musculoskeletal:     Cervical back: Neck supple.     Comments: Bilateral Swelling. Right LE had redness and warmth in lower part  Skin:    General: Skin is warm and dry.  Neurological:       General: No focal deficit present.     Mental Status: She is alert.  Psychiatric:        Mood and Affect: Mood normal.        Behavior: Behavior normal.     Labs reviewed: Basic Metabolic Panel: Recent Labs    01/09/20 0425 01/10/20 0430 01/11/20 0306  NA 140 140 140  K 3.8 3.8 3.6  CL 108 107 110  CO2 23 23 21*  GLUCOSE 103* 90 88  BUN 27* 19 14  CREATININE 0.98 0.85 0.74  CALCIUM 8.8* 8.6* 8.6*   Liver Function Tests: Recent Labs    01/08/20 1448 01/09/20 0425 01/11/20 0306  AST 14* 11* 11*  ALT 10 9 9   ALKPHOS 58 49 62  BILITOT 0.7 0.6 0.8  PROT 5.9* 5.4* 5.4*  ALBUMIN 2.6* 2.5* 2.5*   No results for input(s): LIPASE, AMYLASE in the last 8760 hours. No results for input(s): AMMONIA in the last 8760 hours. CBC: Recent Labs    11/07/19 0000 12/31/19 1900 01/03/20 0615 01/07/20 0000 01/08/20 1448 01/08/20 2035 01/09/20 0425 01/10/20 0430 01/11/20 0306  WBC 5.3   < >   < > 8.3 15.8*   < > 12.6* 15.1* 12.1*  NEUTROABS 2,417  --   --  4,856 12.2*  --   --   --   --   HGB 11.8*   < >   < > 9.0* 8.4*   < > 6.9* 9.9* 10.8*  HCT 36   < >   < > 27* 27.4*   < > 22.8* 29.8* 34.0*  MCV  --    < >   < >  --  97.5   < > 98.7 87.9 91.2  PLT 221   < >   < > 318 260   < > 207 217 266   < > = values in this interval not displayed.   Cardiac Enzymes: No results for input(s): CKTOTAL, CKMB, CKMBINDEX, TROPONINI in the last 8760 hours. BNP: Invalid input(s): POCBNP Lab Results  Component Value Date   HGBA1C 5.3 11/01/2016   Lab Results  Component Value Date   TSH 2.43 04/11/2019   Lab Results  Component Value Date   VITAMINB12 1,076 09/03/2019   No results found for: FOLATE No results found for: IRON, TIBC, FERRITIN  Imaging and Procedures obtained prior to SNF admission: DG Chest 1 View  Result Date: 01/08/2020 CLINICAL DATA:  Hypoxemia EXAM: CHEST  1 VIEW COMPARISON:  04/04/2019 FINDINGS: Left basilar scarring or atelectasis. Right lung clear. Heart  is normal size. No effusions or acute bony abnormality. IMPRESSION: Left base scarring or atelectasis. Electronically Signed   By: Rolm Baptise M.D.   On: 01/08/2020 19:18   DG Tibia/Fibula Right  Result Date: 01/08/2020 CLINICAL DATA:  Patient with fever and lethargy. Recent right tibial surgery. White count. EXAM: RIGHT TIBIA AND FIBULA - 2 VIEW COMPARISON:  Right lower extremity radiograph 01/01/2020 FINDINGS: Status post intramedullary rod and screw fixation for a fracture of the distal tibia. No significant change in fracture alignment compared to prior. Stable appearance of a mildly displaced fracture of the mid fibula. Knee arthroplasty appears intact. Regional soft tissues  unremarkable. IMPRESSION: Status post intramedullary rod and screw fixation for a fracture of the distal tibia. Stable appearance of a mid fibular fracture. No significant change in fracture alignment compared to prior. No evidence of hardware complication. Electronically Signed   By: Audie Pinto M.D.   On: 01/08/2020 16:31   CT Abdomen Pelvis W Contrast  Result Date: 01/08/2020 CLINICAL DATA:  Abdominal pain, distension, fever EXAM: CT ABDOMEN AND PELVIS WITH CONTRAST TECHNIQUE: Multidetector CT imaging of the abdomen and pelvis was performed using the standard protocol following bolus administration of intravenous contrast. CONTRAST:  2mL OMNIPAQUE IOHEXOL 300 MG/ML  SOLN COMPARISON:  None. FINDINGS: Lower chest: Elevation of the left hemidiaphragm with partially visualized consolidation in the posterior left lung base which may reflect atelectasis versus pneumonia. Bandlike atelectasis within the right lung base. Heart size is upper limits of normal. Hepatobiliary: No focal liver abnormality is seen. No gallstones, gallbladder wall thickening, or biliary dilatation. Pancreas: Unremarkable. No pancreatic ductal dilatation or surrounding inflammatory changes. Spleen: Normal in size without focal abnormality. Adrenals/Urinary  Tract: Adrenal glands within normal limits. Simple 2.6 cm upper pole right renal cyst. Two renal stones within the inferior right renal calyx, each measuring approximately 9 mm. Single 4 mm stone within the inferior calyx of the left kidney. There is no hydronephrosis. Ureters and urinary bladder appear unremarkable. Stomach/Bowel: Long segment wall thickening involving the mid to distal aspect of the transverse colon to the distal descending colon with hazy pericolonic fat stranding. There are a few prominent diverticula notably at the mid transverse colon. Remainder of the bowel appears within normal limits. A normal appendix is located in the right lower quadrant. No dilated loops of bowel to suggest obstruction. Stomach is within normal limits. Normal positioning of the GE junction. Vascular/Lymphatic: Scattered aortoiliac atherosclerotic calcification without aneurysm. No abdominopelvic lymphadenopathy. Reproductive: Status post hysterectomy. No adnexal masses. Other: No free air, free fluid, focal fluid collection, or abdominal wall hernia. Musculoskeletal: Prior right hip ORIF. Prior L4-5 fusion. Severe adjacent segment disease at L3-4 with grade 2-3 anterolisthesis. No acute fracture. IMPRESSION: 1. Long segment wall thickening extending from the mid transverse colon to the distal descending colon with hazy pericolonic fat stranding. Findings are suggestive of an infectious or inflammatory colitis. 2. Partially visualized consolidation in the posterior left lung base may reflect atelectasis versus pneumonia. 3. Bilateral nonobstructing nephrolithiasis. 4. Prior L4-5 PLIF with severe adjacent segment disease at L3-4 where there is grade 2-3 anterolisthesis. 5. Aortic atherosclerosis. (ICD10-I70.0). Electronically Signed   By: Davina Poke D.O.   On: 01/08/2020 17:00   DG Abdomen Acute W/Chest  Result Date: 01/08/2020 CLINICAL DATA:  86 year old female with abdominal pain and fever. EXAM: DG ABDOMEN  ACUTE W/ 1V CHEST COMPARISON:  Abdominal CT dated 01/08/2020. FINDINGS: There is no bowel dilatation or evidence of obstruction. No free air. Small bilateral renal calculi noted. There is degenerative changes of the spine and lower lumbar fusion as well as partially visualized right femoral neck screw. No acute osseous pathology. Left lung base atelectasis or infiltrate. IMPRESSION: 1. No evidence of bowel obstruction. 2. Bilateral renal calculi. Electronically Signed   By: Anner Crete M.D.   On: 01/08/2020 16:28   VAS [place] LOWER EXTREMITY VENOUS (DVT)  Result Date: 01/09/2020  Lower Venous DVTStudy Indications: Pain, Edema, and post op knee surgery.  Performing Technologist: June Leap RDMS, RVT  Examination Guidelines: A complete evaluation includes B-mode imaging, spectral Doppler, color Doppler, and power Doppler as needed of all accessible  portions of each vessel. Bilateral testing is considered an integral part of a complete examination. Limited examinations for reoccurring indications may be performed as noted. The reflux portion of the exam is performed with the patient in reverse Trendelenburg.  +---------+---------------+---------+-----------+----------+--------------+ RIGHT    CompressibilityPhasicitySpontaneityPropertiesThrombus Aging +---------+---------------+---------+-----------+----------+--------------+ CFV      Full           Yes      Yes                                 +---------+---------------+---------+-----------+----------+--------------+ SFJ      Full                                                        +---------+---------------+---------+-----------+----------+--------------+ FV Prox  Full                                                        +---------+---------------+---------+-----------+----------+--------------+ FV Mid   Full                                                         +---------+---------------+---------+-----------+----------+--------------+ FV DistalFull                                                        +---------+---------------+---------+-----------+----------+--------------+ PFV      Full                                                        +---------+---------------+---------+-----------+----------+--------------+     Summary: RIGHT: - No evidence of deep vein thrombosis in visualized veins. Patient refused remainder of study due to pain.   *See table(s) above for measurements and observations. Electronically signed by Monica Martinez MD on 01/09/2020 at 4:15:42 PM.    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

## 2020-01-14 NOTE — Telephone Encounter (Signed)
Received fax refill Request from [facility] and sent to Dr. Lyndel Safe for approval.

## 2020-01-15 ENCOUNTER — Encounter: Payer: Self-pay | Admitting: Nurse Practitioner

## 2020-01-15 DIAGNOSIS — L03115 Cellulitis of right lower limb: Secondary | ICD-10-CM | POA: Insufficient documentation

## 2020-01-15 LAB — BASIC METABOLIC PANEL
BUN: 9 (ref 4–21)
Chloride: 109 — AB (ref 99–108)
Creatinine: 0.8 (ref 0.5–1.1)
Glucose: 92
Potassium: 3.2 — AB (ref 3.4–5.3)
Sodium: 142 (ref 137–147)

## 2020-01-15 LAB — COMPREHENSIVE METABOLIC PANEL
Calcium: 8.8 (ref 8.7–10.7)
GFR calc Af Amer: 81
GFR calc non Af Amer: 70

## 2020-01-20 ENCOUNTER — Encounter: Payer: Self-pay | Admitting: Nurse Practitioner

## 2020-01-20 DIAGNOSIS — S82201D Unspecified fracture of shaft of right tibia, subsequent encounter for closed fracture with routine healing: Secondary | ICD-10-CM | POA: Diagnosis not present

## 2020-01-20 NOTE — Assessment & Plan Note (Signed)
Stable, TSH 2.43 04/11/19, continue Levothyroxine

## 2020-01-20 NOTE — Assessment & Plan Note (Signed)
S/p transfusion, Hgb 10.8 01/11/20

## 2020-01-20 NOTE — Assessment & Plan Note (Signed)
Blood pressure is controlled, continue Lisinopril.  

## 2020-01-20 NOTE — Assessment & Plan Note (Signed)
hospitalized 01/08/20-01/12/20, presented to ED  with fever, tachycardia, hypotension, leukocytosis, treated with  Cipro, Metronidazole for colitis, CT abd showed an infectious or inflammatory colitis,

## 2020-01-20 NOTE — Assessment & Plan Note (Signed)
s/p IM nailing for tibial fx, pain is managed, continue  prn Tramadol 50mg  q6hr, Tylenol 650mg  bid, ASA 325mg  bid.

## 2020-01-20 NOTE — Assessment & Plan Note (Addendum)
Her mood is managed,  continue Quetiapine 25mg  qd, Trazodone 50mg  qd,  Lorazepam 0.5mg  qhs, Depakote 250mg  bid, Duloxetine 30mg  qd.

## 2020-01-21 ENCOUNTER — Encounter: Payer: Self-pay | Admitting: Internal Medicine

## 2020-01-21 ENCOUNTER — Non-Acute Institutional Stay (SKILLED_NURSING_FACILITY): Payer: Medicare Other | Admitting: Internal Medicine

## 2020-01-21 DIAGNOSIS — S82242S Displaced spiral fracture of shaft of left tibia, sequela: Secondary | ICD-10-CM | POA: Diagnosis not present

## 2020-01-21 DIAGNOSIS — K529 Noninfective gastroenteritis and colitis, unspecified: Secondary | ICD-10-CM

## 2020-01-21 DIAGNOSIS — F0280 Dementia in other diseases classified elsewhere without behavioral disturbance: Secondary | ICD-10-CM | POA: Diagnosis not present

## 2020-01-21 DIAGNOSIS — I10 Essential (primary) hypertension: Secondary | ICD-10-CM | POA: Diagnosis not present

## 2020-01-21 DIAGNOSIS — G309 Alzheimer's disease, unspecified: Secondary | ICD-10-CM

## 2020-01-21 DIAGNOSIS — F0150 Vascular dementia without behavioral disturbance: Secondary | ICD-10-CM

## 2020-01-21 DIAGNOSIS — R600 Localized edema: Secondary | ICD-10-CM

## 2020-01-21 DIAGNOSIS — L03115 Cellulitis of right lower limb: Secondary | ICD-10-CM

## 2020-01-21 LAB — COMPREHENSIVE METABOLIC PANEL
Calcium: 9.8 (ref 8.7–10.7)
GFR calc Af Amer: 56
GFR calc non Af Amer: 49

## 2020-01-21 LAB — CBC AND DIFFERENTIAL
HCT: 35 — AB (ref 36–46)
Hemoglobin: 11.5 — AB (ref 12.0–16.0)
Platelets: 389 (ref 150–399)
WBC: 9

## 2020-01-21 LAB — BASIC METABOLIC PANEL
BUN: 16 (ref 4–21)
CO2: 28 — AB (ref 13–22)
Creatinine: 1 (ref 0.5–1.1)
Glucose: 122

## 2020-01-21 NOTE — Progress Notes (Signed)
Location: [place] Room Number: 73 Place of Service:  SNF (31)[facility],SNF  Provider: Veleta Miners L.,MD  Code Status: DNR Goals of Care:  Advanced Directives 01/21/2020  Does Patient Have a Medical Advance Directive? Yes  Type of Advance Directive Living will  Does patient want to make changes to medical advance directive? No - Patient declined  Copy of [place] in Chart? -  Would patient like information on creating a medical advance directive? -  Pre-existing out of facility DNR order (yellow form or pink MOST form) -     Chief Complaint  Patient presents with   Acute Visit    Leg Cellulitis    HPI: Patient is a 86 y.o. female seen today for an acute visit for Right Leg Cellulitis Patient long term care resident She is seen in SNF for therapy Was admitted in the hospital from 6/2 to 6/6 for sepsis secondary to colitis. Was also admitted in the Hospital from 5/25-5/28 for Right Fibula Tibia Fractures s/p IM rod Placement Patient also  has h/o Hypertension, Hypothyroidism, Alzheimer's Dementia with behavior issues, Depression, B12 def, S/P Right Hip fracture with Intramedullary Fixation in 8/27  Patient was seen few days ago with redness and swelling of her right leg bed I changed her Cipro to Levaquin today's her last day.  But patient continues to have some redness and warmth in the lower part of her right leg but it is not tender patient denies any kind of pain. Was recently seen by Ortho.  She is WBAT with right leg and now with right leg in a boot/brace  Past Medical History:  Diagnosis Date   Alzheimer disease ([place]) 10/27/2016   Arthritis    Confusion 04/04/2019   Depression, recurrent ([place]) 10/27/2016   History of fracture of right hip 04/04/2019   07/01/19 Ortho, R hip incision healed, X-ray looks good, s/p IMN R hip fx, WBAT, f/u 6 mos.    HTN (hypertension) 10/27/2016    Hypertension    Hypertensive kidney disease with CKD (chronic kidney disease) stage V ([place]) 11/02/2016   11/02/16 Na 136, K 4.7, Bun 30, creat 1.52, TSH 0.86, wbc 6.7, Hgb 11.3, plt 264   Hypothyroidism 10/27/2016   Osteoarthritis 10/27/2016   Thyroid disease     Past Surgical History:  Procedure Laterality Date   ABDOMINAL HYSTERECTOMY  1986   INTRAMEDULLARY (IM) NAIL INTERTROCHANTERIC Right 04/04/2019   Procedure: INTRAMEDULLARY (IM) NAIL INTERTROCHANTRIC;  Surgeon: Rod Can, MD;  Location: [facility];  Service: Orthopedics;  Laterality: Right;   KNEE ARTHROSCOPY  2015/2017   Dr. Gustavus Bryant   SPINE SURGERY  2009, 2011   Dr.Mark Nicoletta Dress   TIBIA IM NAIL INSERTION Right 01/01/2020   Procedure: INTRAMEDULLARY (IM) NAIL TIBIAL;  Surgeon: Shona Needles, MD;  Location: [place];  Service: Orthopedics;  Laterality: Right;    No Known Allergies  Outpatient Encounter Medications as of 01/21/2020  Medication Sig   acetaminophen (TYLENOL) 325 MG tablet Take 650 mg by mouth 2 (two) times daily.   acetaminophen (TYLENOL) 650 MG suppository Place 650 mg rectally every 4 (four) hours as needed for fever.   Amino Acids-Protein Hydrolys (FEEDING SUPPLEMENT, PRO-STAT SUGAR FREE 64,) LIQD Take 30 mLs by mouth daily.   ascorbic acid (VITAMIN C) 500 MG tablet Take 500 mg by mouth daily.   aspirin EC 325 MG tablet Take 1 tablet (325 mg total) by mouth in the morning and at bedtime.  Calcium Carbonate (CALCIUM 600 PO) Take 1 tablet by mouth daily.   cholecalciferol (VITAMIN D3) 25 MCG (1000 UT) tablet Take 2,000 Units by mouth daily.    divalproex (DEPAKOTE) 250 MG DR tablet Take 250 mg by mouth in the morning and at bedtime.   docusate sodium (COLACE) 100 MG capsule Take 1 capsule (100 mg total) by mouth 2 (two) times daily.   DULoxetine (CYMBALTA) 30 MG capsule Take 30 mg by mouth daily.   furosemide (LASIX) 20 MG tablet Take 20 mg by mouth daily.   levofloxacin (LEVAQUIN) 500 MG tablet  Take 500 mg by mouth daily. Last dose on 6/18   levothyroxine (SYNTHROID, LEVOTHROID) 25 MCG tablet Take 25 mcg by mouth daily before breakfast.   lisinopril (ZESTRIL) 10 MG tablet Take 10 mg by mouth daily.   LORazepam (ATIVAN) 0.5 MG tablet Take 1 tablet (0.5 mg total) by mouth at bedtime.   memantine (NAMENDA) 10 MG tablet Take 10 mg by mouth 2 (two) times daily.   ondansetron (ZOFRAN-ODT) 4 MG disintegrating tablet Take 4 mg by mouth every 8 (eight) hours as needed for nausea or vomiting.    QUEtiapine (SEROQUEL) 25 MG tablet Take 25 mg by mouth at bedtime.   traMADol (ULTRAM) 50 MG tablet Take 0.5 tablets (25 mg total) by mouth every 6 (six) hours as needed.   traZODone (DESYREL) 50 MG tablet Take 50 mg by mouth at bedtime.   vitamin B-12 (CYANOCOBALAMIN) 1000 MCG tablet Take 1,000 mcg by mouth. Once a day on Monday and Wednesday   [DISCONTINUED] metroNIDAZOLE (FLAGYL) 500 MG tablet Take 1 tablet (500 mg total) by mouth every 8 (eight) hours.   [DISCONTINUED] saccharomyces boulardii (FLORASTOR) 250 MG capsule Take 250 mg by mouth 2 (two) times daily.   No facility-administered encounter medications on file as of 01/21/2020.    Review of Systems:  Review of Systems  Unable to perform ROS: Dementia    Health Maintenance  Topic Date Due   TETANUS/TDAP  06/22/2027   DEXA SCAN  Completed   COVID-19 Vaccine  Completed   PNA vac Low Risk Adult  Completed   INFLUENZA VACCINE  Discontinued    Physical Exam: Vitals:   01/21/20 1158  BP: 138/76  Pulse: 77  Resp: 18  Temp: 98 F (36.7 C)  SpO2: 96%  Weight: 165 lb 14.4 oz (75.3 kg)  Height: 4\' 11"  (1.499 m)   Body mass index is 33.51 kg/m. Physical Exam Vitals reviewed.  Constitutional:      Appearance: Normal appearance.  HENT:     Head: Normocephalic.     Nose: Nose normal.     Mouth/Throat:     Mouth: Mucous membranes are moist.     Pharynx: Oropharynx is clear.  Eyes:     Pupils: Pupils are equal,  round, and reactive to light.  Cardiovascular:     Rate and Rhythm: Normal rate and regular rhythm.     Pulses: Normal pulses.  Pulmonary:     Effort: Pulmonary effort is normal. No respiratory distress.     Breath sounds: Normal breath sounds. No wheezing or rales.  Abdominal:     General: Abdomen is flat. Bowel sounds are normal.     Palpations: Abdomen is soft.  Musculoskeletal:        General: Swelling present.  Skin:    Comments: Right Lower Leg still warm and Red but much improved  Neurological:     General: No focal deficit present.  Mental Status: She is alert.  Psychiatric:        Mood and Affect: Mood normal.     Labs reviewed: Basic Metabolic Panel: Recent Labs    04/11/19 0000 04/25/19 0000 01/09/20 0425 01/10/20 0430 01/11/20 0306  NA 140   < > 140 140 140  K 4.2   < > 3.8 3.8 3.6  CL  --    < > 108 107 110  CO2  --    < > 23 23 21*  GLUCOSE  --    < > 103* 90 88  BUN 10   < > 27* 19 14  CREATININE 0.8   < > 0.98 0.85 0.74  CALCIUM  --    < > 8.8* 8.6* 8.6*  TSH 2.43  --   --   --   --    < > = values in this interval not displayed.   Liver Function Tests: Recent Labs    01/08/20 1448 01/09/20 0425 01/11/20 0306  AST 14* 11* 11*  ALT 10 9 9   ALKPHOS 58 49 62  BILITOT 0.7 0.6 0.8  PROT 5.9* 5.4* 5.4*  ALBUMIN 2.6* 2.5* 2.5*   No results for input(s): LIPASE, AMYLASE in the last 8760 hours. No results for input(s): AMMONIA in the last 8760 hours. CBC: Recent Labs    11/07/19 0000 12/31/19 1900 01/03/20 0615 01/07/20 0000 01/08/20 1448 01/08/20 2035 01/09/20 0425 01/10/20 0430 01/11/20 0306  WBC 5.3   < >   < > 8.3 15.8*   < > 12.6* 15.1* 12.1*  NEUTROABS 2,417  --   --  4,856 12.2*  --   --   --   --   HGB 11.8*   < >   < > 9.0* 8.4*   < > 6.9* 9.9* 10.8*  HCT 36   < >   < > 27* 27.4*   < > 22.8* 29.8* 34.0*  MCV  --    < >   < >  --  97.5   < > 98.7 87.9 91.2  PLT 221   < >   < > 318 260   < > 207 217 266   < > = values in this  interval not displayed.   Lipid Panel: No results for input(s): CHOL, HDL, LDLCALC, TRIG, CHOLHDL, LDLDIRECT in the last 8760 hours. Lab Results  Component Value Date   HGBA1C 5.3 11/01/2016    Procedures since last visit: DG Chest 1 View  Result Date: 01/08/2020 CLINICAL DATA:  Hypoxemia EXAM: CHEST  1 VIEW COMPARISON:  04/04/2019 FINDINGS: Left basilar scarring or atelectasis. Right lung clear. Heart is normal size. No effusions or acute bony abnormality. IMPRESSION: Left base scarring or atelectasis. Electronically Signed   By: Rolm Baptise M.D.   On: 01/08/2020 19:18   DG Tibia/Fibula Right  Result Date: 01/08/2020 CLINICAL DATA:  Patient with fever and lethargy. Recent right tibial surgery. White count. EXAM: RIGHT TIBIA AND FIBULA - 2 VIEW COMPARISON:  Right lower extremity radiograph 01/01/2020 FINDINGS: Status post intramedullary rod and screw fixation for a fracture of the distal tibia. No significant change in fracture alignment compared to prior. Stable appearance of a mildly displaced fracture of the mid fibula. Knee arthroplasty appears intact. Regional soft tissues unremarkable. IMPRESSION: Status post intramedullary rod and screw fixation for a fracture of the distal tibia. Stable appearance of a mid fibular fracture. No significant change in fracture alignment compared to prior. No evidence of  hardware complication. Electronically Signed   By: Audie Pinto M.D.   On: 01/08/2020 16:31   DG Tibia/Fibula Right  Result Date: 01/01/2020 CLINICAL DATA:  Right tibia and fibula fractures. EXAM: RIGHT TIBIA AND FIBULA - 2 VIEW; DG C-ARM 1-60 MIN COMPARISON:  12/31/2019 FINDINGS: Nine C-arm views of the right lower leg demonstrate intramedullary rod and screw fixation of the previously demonstrated distal tibia fracture with essentially anatomic position and alignment. The previously described fibular shaft fracture is again noted. Stable knee prosthesis. IMPRESSION: Hardware fixation of  the previously demonstrated distal tibia fracture. Electronically Signed   By: Claudie Revering M.D.   On: 01/01/2020 13:59   DG Ankle Complete Right  Result Date: 12/31/2019 CLINICAL DATA:  [place].  Tib fib deformity. EXAM: RIGHT ANKLE - COMPLETE 3+ VIEW COMPARISON:  None. FINDINGS: Displaced spiral type fracture of the mid distal tibial shaft and the mid fibular shaft. There is 1 shaft width of lateral and anterior displacement of the tibial fracture with a moderate-sized butterfly fragment. The ankle and hindfoot bony structures are intact. IMPRESSION: Displaced spiral type fractures of the mid distal tibial shaft and the mid fibula. Electronically Signed   By: Marijo Sanes M.D.   On: 12/31/2019 18:51   CT Abdomen Pelvis W Contrast  Result Date: 01/08/2020 CLINICAL DATA:  Abdominal pain, distension, fever EXAM: CT ABDOMEN AND PELVIS WITH CONTRAST TECHNIQUE: Multidetector CT imaging of the abdomen and pelvis was performed using the standard protocol following bolus administration of intravenous contrast. CONTRAST:  17mL OMNIPAQUE IOHEXOL 300 MG/ML  SOLN COMPARISON:  None. FINDINGS: Lower chest: Elevation of the left hemidiaphragm with partially visualized consolidation in the posterior left lung base which may reflect atelectasis versus pneumonia. Bandlike atelectasis within the right lung base. Heart size is upper limits of normal. Hepatobiliary: No focal liver abnormality is seen. No gallstones, gallbladder wall thickening, or biliary dilatation. Pancreas: Unremarkable. No pancreatic ductal dilatation or surrounding inflammatory changes. Spleen: Normal in size without focal abnormality. Adrenals/Urinary Tract: Adrenal glands within normal limits. Simple 2.6 cm upper pole right renal cyst. Two renal stones within the inferior right renal calyx, each measuring approximately 9 mm. Single 4 mm stone within the inferior calyx of the left kidney. There is no hydronephrosis. Ureters and urinary bladder appear  unremarkable. Stomach/Bowel: Long segment wall thickening involving the mid to distal aspect of the transverse colon to the distal descending colon with hazy pericolonic fat stranding. There are a few prominent diverticula notably at the mid transverse colon. Remainder of the bowel appears within normal limits. A normal appendix is located in the right lower quadrant. No dilated loops of bowel to suggest obstruction. Stomach is within normal limits. Normal positioning of the GE junction. Vascular/Lymphatic: Scattered aortoiliac atherosclerotic calcification without aneurysm. No abdominopelvic lymphadenopathy. Reproductive: Status post hysterectomy. No adnexal masses. Other: No free air, free fluid, focal fluid collection, or abdominal wall hernia. Musculoskeletal: Prior right hip ORIF. Prior L4-5 fusion. Severe adjacent segment disease at L3-4 with grade 2-3 anterolisthesis. No acute fracture. IMPRESSION: 1. Long segment wall thickening extending from the mid transverse colon to the distal descending colon with hazy pericolonic fat stranding. Findings are suggestive of an infectious or inflammatory colitis. 2. Partially visualized consolidation in the posterior left lung base may reflect atelectasis versus pneumonia. 3. Bilateral nonobstructing nephrolithiasis. 4. Prior L4-5 PLIF with severe adjacent segment disease at L3-4 where there is grade 2-3 anterolisthesis. 5. Aortic atherosclerosis. (ICD10-I70.0). Electronically Signed   By: Davina Poke D.O.  On: 01/08/2020 17:00   DG Knee Complete 4 Views Right  Result Date: 12/31/2019 CLINICAL DATA:  Knee pain after fall EXAM: RIGHT KNEE - COMPLETE 4+ VIEW COMPARISON:  None. FINDINGS: Normal appearance of right total knee arthroplasty. There is a laterally displaced oblique fracture of the midshaft of the right fibula. No knee effusion. IMPRESSION: Laterally displaced oblique fracture of the midshaft of the right fibula. Right total knee arthroplasty without  adverse features. Electronically Signed   By: Ulyses Jarred M.D.   On: 12/31/2019 19:39   DG Tibia/Fibula Right Port  Result Date: 01/01/2020 CLINICAL DATA:  Status post right tibial fixation. EXAM: PORTABLE RIGHT TIBIA AND FIBULA - 2 VIEW COMPARISON:  Dec 31, 2019. FINDINGS: Status post intramedullary rod fixation of distal right tibial fracture. Good alignment of fracture components is noted. Stable appearance of mid fibular shaft fracture. IMPRESSION: Status post intramedullary rod fixation of distal right tibial fracture. Electronically Signed   By: Marijo Conception M.D.   On: 01/01/2020 16:08   DG Abdomen Acute W/Chest  Result Date: 01/08/2020 CLINICAL DATA:  86 year old female with abdominal pain and fever. EXAM: DG ABDOMEN ACUTE W/ 1V CHEST COMPARISON:  Abdominal CT dated 01/08/2020. FINDINGS: There is no bowel dilatation or evidence of obstruction. No free air. Small bilateral renal calculi noted. There is degenerative changes of the spine and lower lumbar fusion as well as partially visualized right femoral neck screw. No acute osseous pathology. Left lung base atelectasis or infiltrate. IMPRESSION: 1. No evidence of bowel obstruction. 2. Bilateral renal calculi. Electronically Signed   By: Anner Crete M.D.   On: 01/08/2020 16:28   DG Toe Great Right  Result Date: 12/31/2019 CLINICAL DATA:  Fall with toe pain EXAM: RIGHT GREAT TOE COMPARISON:  None. FINDINGS: There is no evidence of fracture or dislocation. There is no evidence of arthropathy or other focal bone abnormality. Soft tissues are unremarkable. IMPRESSION: Negative. Electronically Signed   By: Ulyses Jarred M.D.   On: 12/31/2019 19:38   DG C-Arm 1-60 Min  Result Date: 01/01/2020 CLINICAL DATA:  Right tibia and fibula fractures. EXAM: RIGHT TIBIA AND FIBULA - 2 VIEW; DG C-ARM 1-60 MIN COMPARISON:  12/31/2019 FINDINGS: Nine C-arm views of the right lower leg demonstrate intramedullary rod and screw fixation of the previously  demonstrated distal tibia fracture with essentially anatomic position and alignment. The previously described fibular shaft fracture is again noted. Stable knee prosthesis. IMPRESSION: Hardware fixation of the previously demonstrated distal tibia fracture. Electronically Signed   By: Claudie Revering M.D.   On: 01/01/2020 13:59   VAS [place] LOWER EXTREMITY VENOUS (DVT)  Result Date: 01/09/2020  Lower Venous DVTStudy Indications: Pain, Edema, and post op knee surgery.  Performing Technologist: June Leap RDMS, RVT  Examination Guidelines: A complete evaluation includes B-mode imaging, spectral Doppler, color Doppler, and power Doppler as needed of all accessible portions of each vessel. Bilateral testing is considered an integral part of a complete examination. Limited examinations for reoccurring indications may be performed as noted. The reflux portion of the exam is performed with the patient in reverse Trendelenburg.  +---------+---------------+---------+-----------+----------+--------------+  RIGHT     Compressibility Phasicity Spontaneity Properties Thrombus Aging  +---------+---------------+---------+-----------+----------+--------------+  CFV       Full            Yes       Yes                                    +---------+---------------+---------+-----------+----------+--------------+  SFJ       Full                                                             +---------+---------------+---------+-----------+----------+--------------+  FV Prox   Full                                                             +---------+---------------+---------+-----------+----------+--------------+  FV Mid    Full                                                             +---------+---------------+---------+-----------+----------+--------------+  FV Distal Full                                                             +---------+---------------+---------+-----------+----------+--------------+  PFV       Full                                                              +---------+---------------+---------+-----------+----------+--------------+     Summary: RIGHT: - No evidence of deep vein thrombosis in visualized veins. Patient refused remainder of study due to pain.   *See table(s) above for measurements and observations. Electronically signed by Monica Martinez MD on 01/09/2020 at 4:15:42 PM.    Final     Assessment/Plan Cellulitis of right lower extremity Will continue Levaquin for 3 more days to total of 10 days  Colitis, acute Finished antibiotics today Bilateral leg edema Doing well on Lasix Repeat BMP pending  S/P  IM Nailing for Tibial Fracture Pain Controlled on tramadol WBAT Continue to wear RLE Brace Will decrase the dose of Aspirin to 81 mg after 28 days Mixed Alzheimer's and vascular dementia (HCC) Change Trazadone to 25 mg and then stop it as has been ineffective and will try to minimize her Meds Contineu Ativan, Seroquel, Depakote and Namenda Essential hypertension Controlled on lisinopril ACP MOST Form Signed. She is Comfort care  Labs/tests ordered:  * No order type specified * Next appt:  Visit date not found

## 2020-02-03 ENCOUNTER — Encounter: Payer: Self-pay | Admitting: Nurse Practitioner

## 2020-02-03 ENCOUNTER — Non-Acute Institutional Stay (SKILLED_NURSING_FACILITY): Payer: Medicare Other | Admitting: Nurse Practitioner

## 2020-02-03 DIAGNOSIS — F0280 Dementia in other diseases classified elsewhere without behavioral disturbance: Secondary | ICD-10-CM

## 2020-02-03 DIAGNOSIS — F339 Major depressive disorder, recurrent, unspecified: Secondary | ICD-10-CM

## 2020-02-03 DIAGNOSIS — F99 Mental disorder, not otherwise specified: Secondary | ICD-10-CM

## 2020-02-03 DIAGNOSIS — F5105 Insomnia due to other mental disorder: Secondary | ICD-10-CM

## 2020-02-03 DIAGNOSIS — R609 Edema, unspecified: Secondary | ICD-10-CM

## 2020-02-03 DIAGNOSIS — R296 Repeated falls: Secondary | ICD-10-CM

## 2020-02-03 DIAGNOSIS — I10 Essential (primary) hypertension: Secondary | ICD-10-CM

## 2020-02-03 DIAGNOSIS — E039 Hypothyroidism, unspecified: Secondary | ICD-10-CM

## 2020-02-03 DIAGNOSIS — G309 Alzheimer's disease, unspecified: Secondary | ICD-10-CM

## 2020-02-03 DIAGNOSIS — F0150 Vascular dementia without behavioral disturbance: Secondary | ICD-10-CM

## 2020-02-03 DIAGNOSIS — R269 Unspecified abnormalities of gait and mobility: Secondary | ICD-10-CM

## 2020-02-03 DIAGNOSIS — M159 Polyosteoarthritis, unspecified: Secondary | ICD-10-CM

## 2020-02-03 NOTE — Assessment & Plan Note (Signed)
Continue Levothyroxine 81mcg qd, TH 2.43 04/11/19

## 2020-02-03 NOTE — Assessment & Plan Note (Signed)
Continue Trazodone 25mg  qd, Lorazepam 0.5mg  qhs.

## 2020-02-03 NOTE — Assessment & Plan Note (Signed)
Mood is stable, continue Duloxetine 30g qd, Lorazepam 0.5mg  hs, Quetiapine 25mg  qd, Trazodone 25mg  qd, will decrease Depakote 125mg  bid/250mg  bid. Observe.

## 2020-02-03 NOTE — Progress Notes (Signed)
Location:   SNF [place] Room Number: 73 Place of Service:  SNF (31)[facility] Provider: [facility] Adja Ruff NP  [name], MD  Patient Care Team: [name], MD as PCP - General (Internal Medicine) Stormy Connon X, NP as Nurse Practitioner (Internal Medicine) [name], MD (Internal Medicine)  Extended Emergency Contact Information Primary Emergency Contact: [facility] Address: [address]          [place], [place] [address] [name] of [place] Phone: [phone] Relation: Son Secondary Emergency Contact: [facility] Address: [address] [place] [address] [name] Phone: [phone] Relation: Daughter  Code Status: DNR Goals of care: Advanced Directive information Advanced Directives 02/03/2020  Does Patient Have a Medical Advance Directive? Yes  Type of Paramedic of [place];Out of facility DNR (pink MOST or yellow form);Living will  Does patient want to make changes to medical advance directive? No - Patient declined  Copy of [place] in Chart? Yes - validated most recent copy scanned in chart (See row information)  Would patient like information on creating a medical advance directive? -  Pre-existing out of facility DNR order (yellow form or pink MOST form) Yellow form placed in chart (order not valid for inpatient use)     Chief Complaint  Patient presents with  . Acute Visit    Fall    HPI:  Pt is a 86 y.o. female seen today for an acute visit for the patient was observed in sitting position on floor bedside of bed when attempted transferring self w/o assistance 01/31/20, 02/01/20  RLE s/p ORIF IM nailing for tibial fx, , in surgical boot, takes Tylenol 650mg  bid, prn Tramadol  Dementia, takes Memantine 10mg  bid.   Chronic edema BLE, R>L, takes Furosemide 20mg  qd.   Depression/anxiety, takes Depakote 250mg  bid, Duloxetine 30mg  qd, Lorazepam 0.5mg   qhs, Quetiapine 25mg  qd, Trazodone 25mg  qd.   Hypothyroidism, takes Levothyroxine 9mcg qd   HTN, takes Lisinopril 10mg  qd.    Past Medical History:  Diagnosis Date  . Alzheimer disease ([place]) 10/27/2016  . Arthritis   . Confusion 04/04/2019  . Depression, recurrent ([place]) 10/27/2016  . History of fracture of right hip 04/04/2019   07/01/19 Ortho, R hip incision healed, X-ray looks good, s/p IMN R hip fx, WBAT, f/u 6 mos.   Marland Kitchen HTN (hypertension) 10/27/2016  . Hypertension   . Hypertensive kidney disease with CKD (chronic kidney disease) stage V ([place]) 11/02/2016   11/02/16 Na 136, K 4.7, Bun 30, creat 1.52, TSH 0.86, wbc 6.7, Hgb 11.3, plt 264  . Hypothyroidism 10/27/2016  . Osteoarthritis 10/27/2016  . Thyroid disease    Past Surgical History:  Procedure Laterality Date  . ABDOMINAL HYSTERECTOMY  1986  . INTRAMEDULLARY (IM) NAIL INTERTROCHANTERIC Right 04/04/2019   Procedure: INTRAMEDULLARY (IM) NAIL INTERTROCHANTRIC;  Surgeon: Rod Can, MD;  Location: [facility];  Service: Orthopedics;  Laterality: Right;  . KNEE ARTHROSCOPY  2015/2017   Dr. Gustavus Bryant  . SPINE SURGERY  2009, 2011   Dr.Mark Nicoletta Dress  . TIBIA IM NAIL INSERTION Right 01/01/2020   Procedure: INTRAMEDULLARY (IM) NAIL TIBIAL;  Surgeon: Shona Needles, MD;  Location: [place];  Service: Orthopedics;  Laterality: Right;    No Known Allergies  Allergies as of 02/03/2020   No Known Allergies     Medication List       Accurate as of February 03, 2020 11:59 PM. If you have any questions, ask your nurse or doctor.        STOP taking these medications   levofloxacin 500 MG tablet Commonly known as: LEVAQUIN Stopped by: Hazley Dezeeuw X Ole Lafon, NP   traZODone 50 MG tablet Commonly known as: DESYREL Stopped by: Antoinette Borgwardt X Stanislav Gervase, NP     TAKE these medications   acetaminophen 325 MG tablet Commonly known as: TYLENOL Take 650 mg by mouth 2 (two) times daily.   acetaminophen 650 MG suppository Commonly known as: TYLENOL Place 650 mg rectally  every 4 (four) hours as needed for fever.   ascorbic acid 500 MG tablet Commonly known as: VITAMIN C Take 500 mg by mouth daily.   aspirin EC 325 MG tablet Take 325 mg by mouth 2 (two) times daily.   CALCIUM 600 PO Take 1 tablet by mouth daily.   cholecalciferol 25 MCG (1000 UNIT) tablet Commonly known as: VITAMIN D3 Take 2,000 Units by mouth daily.   divalproex 250 MG DR tablet Commonly known as: DEPAKOTE Take 250 mg by mouth in the morning and at bedtime.   docusate sodium 100 MG capsule Commonly known as: COLACE Take 1 capsule (100 mg total) by mouth 2 (two) times daily.   DULoxetine 30 MG capsule Commonly known as: CYMBALTA Take 30 mg by mouth daily.   feeding supplement (PRO-STAT SUGAR FREE 64) Liqd Take 30 mLs by mouth daily.   furosemide 20 MG tablet Commonly known as: LASIX Take 20 mg by mouth daily.   levothyroxine 25 MCG tablet Commonly known as: SYNTHROID Take 25 mcg by mouth daily before breakfast.   lisinopril 10 MG tablet Commonly known as: ZESTRIL Take 10 mg by mouth daily.   LORazepam 0.5 MG tablet Commonly known as: ATIVAN Take 1 tablet (0.5 mg total) by mouth at bedtime.   memantine 10 MG tablet Commonly known as: NAMENDA Take 10 mg by mouth 2 (two) times daily.   ondansetron 4 MG disintegrating tablet Commonly known as: ZOFRAN-ODT Take 4 mg by mouth every 8 (eight) hours as needed for nausea or vomiting.   QUEtiapine 25 MG tablet Commonly known as: SEROQUEL Take 25 mg by mouth at bedtime.   traMADol 50 MG tablet Commonly known as: ULTRAM Take 0.5 tablets (25 mg total) by mouth every 6 (six) hours as needed.   vitamin B-12 1000 MCG tablet Commonly known as: CYANOCOBALAMIN Take 1,000 mcg by mouth. Once a day on Monday and Wednesday       Review of Systems  Constitutional: Negative for activity change, appetite change and fever.  HENT: Positive for hearing loss. Negative for congestion and voice change.   Eyes: Negative for  visual disturbance.  Respiratory: Negative for cough, shortness of breath and wheezing.   Cardiovascular: Positive for leg swelling.  Gastrointestinal: Negative for abdominal pain and diarrhea.  Genitourinary: Negative for difficulty urinating, dysuria, frequency and urgency.  Musculoskeletal: Positive for arthralgias and gait problem.       S/p R lower leg IM tibia  Skin: Negative for color change.  Neurological: Negative for dizziness, speech difficulty, weakness and headaches.       Dementia, lethargic.   Psychiatric/Behavioral: Positive for confusion. Negative for agitation, behavioral problems and sleep disturbance. The patient is not nervous/anxious.     Immunization History  Administered Date(s) Administered  . Influenza-Unspecified 05/29/2017  . Moderna SARS-COVID-2 Vaccination 09/07/2019, 10/05/2019  . Pneumococcal Conjugate-13 06/21/2017  . Pneumococcal Polysaccharide-23 07/16/2018  . Tdap 06/21/2017   Pertinent  Health Maintenance Due  Topic Date Due  . DEXA SCAN  Completed  . PNA vac Low Risk Adult  Completed  . INFLUENZA VACCINE  Discontinued   Fall Risk  04/27/2018 04/25/2017  Falls in the past year? No No   Functional Status Survey:    Vitals:   02/03/20 1548  BP: 126/88  Pulse: (!) 104  Resp: 20  Temp: 98.1 F (36.7 C)  Weight: 191 lb 11.2 oz (87 kg)  Height: 4\' 11"  (1.499 m)   Body mass index is 38.72 kg/m. Physical Exam Vitals and nursing note reviewed.  Constitutional:      Appearance: Normal appearance.  HENT:     Head: Normocephalic and atraumatic.     Mouth/Throat:     Mouth: Mucous membranes are moist.  Eyes:     Extraocular Movements: Extraocular movements intact.     Conjunctiva/sclera: Conjunctivae normal.     Pupils: Pupils are equal, round, and reactive to light.  Cardiovascular:     Rate and Rhythm: Regular rhythm. Tachycardia present.     Heart sounds: No murmur heard.   Pulmonary:     Effort: Pulmonary effort is normal.      Breath sounds: No rales.  Abdominal:     General: Bowel sounds are normal.     Palpations: Abdomen is soft.     Tenderness: There is no abdominal tenderness.  Musculoskeletal:        General: Tenderness present.     Cervical back: Normal range of motion and neck supple.     Right lower leg: Edema present.     Left lower leg: Edema present.     Comments: Surgical boot RLE, s/p IM rod for tibia fx  Skin:    General: Skin is warm and dry.  Neurological:     General: No focal deficit present.     Mental Status: She is alert.     Gait: Gait abnormal.  Psychiatric:        Mood and Affect: Mood normal.     Labs reviewed: Recent Labs    01/09/20 0425 01/09/20 0425 01/10/20 0430 01/10/20 0430 01/11/20 0306 01/15/20 0000 01/21/20 0000  NA 140   < > 140  --  140 142  --   K 3.8   < > 3.8  --  3.6 3.2*  --   CL 108   < > 107  --  110 109*  --   CO2 23   < > 23  --  21*  --  28*  GLUCOSE 103*  --  90  --  88  --   --   BUN 27*   < > 19   < > 14 9 16   CREATININE 0.98   < > 0.85   < > 0.74 0.8 1.0  CALCIUM 8.8*   < > 8.6*   < > 8.6* 8.8 9.8   < > = values in this interval not displayed.   Recent Labs    01/08/20 1448 01/09/20 0425 01/11/20 0306  AST 14* 11* 11*  ALT 10 9 9   ALKPHOS 58 49 62  BILITOT 0.7 0.6 0.8  PROT 5.9* 5.4* 5.4*  ALBUMIN 2.6* 2.5* 2.5*   Recent Labs    11/07/19 0000 12/31/19 1900 01/03/20 0615 01/07/20 0000 01/08/20 1448 01/08/20 2035 01/09/20 0425 01/09/20 0425 01/10/20 0430 01/11/20 0306 01/21/20 0000  WBC 5.3   < >   < > 8.3 15.8*   < > 12.6*   < >  15.1* 12.1* 9.0  NEUTROABS 2,417  --   --  4,856 12.2*  --   --   --   --   --   --   HGB 11.8*   < >   < > 9.0* 8.4*   < > 6.9*   < > 9.9* 10.8* 11.5*  HCT 36   < >   < > 27* 27.4*   < > 22.8*   < > 29.8* 34.0* 35*  MCV  --    < >   < >  --  97.5   < > 98.7  --  87.9 91.2  --   PLT 221   < >   < > 318 260   < > 207   < > 217 266 389   < > = values in this interval not displayed.   Lab  Results  Component Value Date   TSH 2.43 04/11/2019   Lab Results  Component Value Date   HGBA1C 5.3 11/01/2016   No results found for: CHOL, HDL, LDLCALC, LDLDIRECT, TRIG, CHOLHDL  Significant Diagnostic Results in last 30 days:  DG Chest 1 View  Result Date: 01/08/2020 CLINICAL DATA:  Hypoxemia EXAM: CHEST  1 VIEW COMPARISON:  04/04/2019 FINDINGS: Left basilar scarring or atelectasis. Right lung clear. Heart is normal size. No effusions or acute bony abnormality. IMPRESSION: Left base scarring or atelectasis. Electronically Signed   By: Rolm Baptise M.D.   On: 01/08/2020 19:18   DG Tibia/Fibula Right  Result Date: 01/08/2020 CLINICAL DATA:  Patient with fever and lethargy. Recent right tibial surgery. White count. EXAM: RIGHT TIBIA AND FIBULA - 2 VIEW COMPARISON:  Right lower extremity radiograph 01/01/2020 FINDINGS: Status post intramedullary rod and screw fixation for a fracture of the distal tibia. No significant change in fracture alignment compared to prior. Stable appearance of a mildly displaced fracture of the mid fibula. Knee arthroplasty appears intact. Regional soft tissues unremarkable. IMPRESSION: Status post intramedullary rod and screw fixation for a fracture of the distal tibia. Stable appearance of a mid fibular fracture. No significant change in fracture alignment compared to prior. No evidence of hardware complication. Electronically Signed   By: Audie Pinto M.D.   On: 01/08/2020 16:31   CT Abdomen Pelvis W Contrast  Result Date: 01/08/2020 CLINICAL DATA:  Abdominal pain, distension, fever EXAM: CT ABDOMEN AND PELVIS WITH CONTRAST TECHNIQUE: Multidetector CT imaging of the abdomen and pelvis was performed using the standard protocol following bolus administration of intravenous contrast. CONTRAST:  58mL OMNIPAQUE IOHEXOL 300 MG/ML  SOLN COMPARISON:  None. FINDINGS: Lower chest: Elevation of the left hemidiaphragm with partially visualized consolidation in the posterior  left lung base which may reflect atelectasis versus pneumonia. Bandlike atelectasis within the right lung base. Heart size is upper limits of normal. Hepatobiliary: No focal liver abnormality is seen. No gallstones, gallbladder wall thickening, or biliary dilatation. Pancreas: Unremarkable. No pancreatic ductal dilatation or surrounding inflammatory changes. Spleen: Normal in size without focal abnormality. Adrenals/Urinary Tract: Adrenal glands within normal limits. Simple 2.6 cm upper pole right renal cyst. Two renal stones within the inferior right renal calyx, each measuring approximately 9 mm. Single 4 mm stone within the inferior calyx of the left kidney. There is no hydronephrosis. Ureters and urinary bladder appear unremarkable. Stomach/Bowel: Long segment wall thickening involving the mid to distal aspect of the transverse colon to the distal descending colon with hazy pericolonic fat stranding. There are a few prominent diverticula notably at the  mid transverse colon. Remainder of the bowel appears within normal limits. A normal appendix is located in the right lower quadrant. No dilated loops of bowel to suggest obstruction. Stomach is within normal limits. Normal positioning of the GE junction. Vascular/Lymphatic: Scattered aortoiliac atherosclerotic calcification without aneurysm. No abdominopelvic lymphadenopathy. Reproductive: Status post hysterectomy. No adnexal masses. Other: No free air, free fluid, focal fluid collection, or abdominal wall hernia. Musculoskeletal: Prior right hip ORIF. Prior L4-5 fusion. Severe adjacent segment disease at L3-4 with grade 2-3 anterolisthesis. No acute fracture. IMPRESSION: 1. Long segment wall thickening extending from the mid transverse colon to the distal descending colon with hazy pericolonic fat stranding. Findings are suggestive of an infectious or inflammatory colitis. 2. Partially visualized consolidation in the posterior left lung base may reflect  atelectasis versus pneumonia. 3. Bilateral nonobstructing nephrolithiasis. 4. Prior L4-5 PLIF with severe adjacent segment disease at L3-4 where there is grade 2-3 anterolisthesis. 5. Aortic atherosclerosis. (ICD10-I70.0). Electronically Signed   By: Davina Poke D.O.   On: 01/08/2020 17:00   DG Abdomen Acute W/Chest  Result Date: 01/08/2020 CLINICAL DATA:  86 year old female with abdominal pain and fever. EXAM: DG ABDOMEN ACUTE W/ 1V CHEST COMPARISON:  Abdominal CT dated 01/08/2020. FINDINGS: There is no bowel dilatation or evidence of obstruction. No free air. Small bilateral renal calculi noted. There is degenerative changes of the spine and lower lumbar fusion as well as partially visualized right femoral neck screw. No acute osseous pathology. Left lung base atelectasis or infiltrate. IMPRESSION: 1. No evidence of bowel obstruction. 2. Bilateral renal calculi. Electronically Signed   By: Anner Crete M.D.   On: 01/08/2020 16:28   VAS [place] LOWER EXTREMITY VENOUS (DVT)  Result Date: 01/09/2020  Lower Venous DVTStudy Indications: Pain, Edema, and post op knee surgery.  Performing Technologist: June Leap RDMS, RVT  Examination Guidelines: A complete evaluation includes B-mode imaging, spectral Doppler, color Doppler, and power Doppler as needed of all accessible portions of each vessel. Bilateral testing is considered an integral part of a complete examination. Limited examinations for reoccurring indications may be performed as noted. The reflux portion of the exam is performed with the patient in reverse Trendelenburg.  +---------+---------------+---------+-----------+----------+--------------+ RIGHT    CompressibilityPhasicitySpontaneityPropertiesThrombus Aging +---------+---------------+---------+-----------+----------+--------------+ CFV      Full           Yes      Yes                                 +---------+---------------+---------+-----------+----------+--------------+ SFJ       Full                                                        +---------+---------------+---------+-----------+----------+--------------+ FV Prox  Full                                                        +---------+---------------+---------+-----------+----------+--------------+ FV Mid   Full                                                        +---------+---------------+---------+-----------+----------+--------------+  FV DistalFull                                                        +---------+---------------+---------+-----------+----------+--------------+ PFV      Full                                                        +---------+---------------+---------+-----------+----------+--------------+     Summary: RIGHT: - No evidence of deep vein thrombosis in visualized veins. Patient refused remainder of study due to pain.   *See table(s) above for measurements and observations. Electronically signed by Monica Martinez MD on 01/09/2020 at 4:15:42 PM.    Final     Assessment/Plan: Recurrent falls Most recent events 01/31/20, 02/01/20, lack of safety awareness, worsening gait are contributory, close supervision/assistance needed for safety, GDR will be beneficial, will decrease Depakote 125mg  bid/250mg  bid since the patient's mood is stable presently.   Insomnia Continue Trazodone 25mg  qd, Lorazepam 0.5mg  qhs.   Gait abnormality Worsening, close supervision, assistance for transfer, ambulation.   Edema Minimal RLE>LLE, continue Furosemide.   Depression, recurrent ([place]) Mood is stable, continue Duloxetine 30g qd, Lorazepam 0.5mg  hs, Quetiapine 25mg  qd, Trazodone 25mg  qd, will decrease Depakote 125mg  bid/250mg  bid. Observe.   Essential hypertension Blood pressure is controlled, continue Lisinopril.   Mixed Alzheimer's and vascular dementia ([place]) Advanced, continue SN[facility] for safety, care assistance, continue Memantine 10mg  bid.   Osteoarthritis S/p  ORIF IM nailing R tibial fx, WBAT, surgical boot, continue Tylenol bid, prn Tramadol for pain.   Hypothyroidism Continue Levothyroxine 13mcg qd, TH 2.43 04/11/19    Family/ staff Communication: plan of care reviewed with the patient and charge nurse.   Labs/tests ordered:  none  Time spend 25 minutes.

## 2020-02-03 NOTE — Assessment & Plan Note (Signed)
Advanced, continue [facility] for safety, care assistance, continue Memantine 10mg  bid.

## 2020-02-03 NOTE — Assessment & Plan Note (Signed)
Most recent events 01/31/20, 02/01/20, lack of safety awareness, worsening gait are contributory, close supervision/assistance needed for safety, GDR will be beneficial, will decrease Depakote 125mg  bid/250mg  bid since the patient's mood is stable presently.

## 2020-02-03 NOTE — Assessment & Plan Note (Signed)
Worsening, close supervision, assistance for transfer, ambulation.

## 2020-02-03 NOTE — Assessment & Plan Note (Signed)
Minimal RLE>LLE, continue Furosemide.

## 2020-02-03 NOTE — Assessment & Plan Note (Signed)
S/p ORIF IM nailing R tibial fx, WBAT, surgical boot, continue Tylenol bid, prn Tramadol for pain.

## 2020-02-03 NOTE — Assessment & Plan Note (Signed)
Blood pressure is controlled, continue Lisinopril.  

## 2020-02-04 ENCOUNTER — Encounter: Payer: Self-pay | Admitting: Nurse Practitioner

## 2020-02-05 ENCOUNTER — Other Ambulatory Visit: Payer: Self-pay

## 2020-02-05 MED ORDER — LORAZEPAM 0.5 MG PO TABS: 0.5000 mg | ORAL_TABLET | Freq: Every day | ORAL | 0 refills | Status: DC

## 2020-02-05 NOTE — Telephone Encounter (Signed)
Refill request received from pharmacy for Lorazepam 0.5 mg one at bedtime

## 2020-02-11 ENCOUNTER — Non-Acute Institutional Stay (SKILLED_NURSING_FACILITY): Payer: Medicare Other | Admitting: Internal Medicine

## 2020-02-11 ENCOUNTER — Encounter: Payer: Self-pay | Admitting: Internal Medicine

## 2020-02-11 DIAGNOSIS — F0150 Vascular dementia without behavioral disturbance: Secondary | ICD-10-CM

## 2020-02-11 DIAGNOSIS — F5105 Insomnia due to other mental disorder: Secondary | ICD-10-CM

## 2020-02-11 DIAGNOSIS — R269 Unspecified abnormalities of gait and mobility: Secondary | ICD-10-CM | POA: Diagnosis not present

## 2020-02-11 DIAGNOSIS — R609 Edema, unspecified: Secondary | ICD-10-CM | POA: Diagnosis not present

## 2020-02-11 DIAGNOSIS — F0280 Dementia in other diseases classified elsewhere without behavioral disturbance: Secondary | ICD-10-CM

## 2020-02-11 DIAGNOSIS — F339 Major depressive disorder, recurrent, unspecified: Secondary | ICD-10-CM

## 2020-02-11 DIAGNOSIS — R296 Repeated falls: Secondary | ICD-10-CM

## 2020-02-11 DIAGNOSIS — I10 Essential (primary) hypertension: Secondary | ICD-10-CM

## 2020-02-11 DIAGNOSIS — F99 Mental disorder, not otherwise specified: Secondary | ICD-10-CM

## 2020-02-11 DIAGNOSIS — E039 Hypothyroidism, unspecified: Secondary | ICD-10-CM

## 2020-02-11 DIAGNOSIS — G309 Alzheimer's disease, unspecified: Secondary | ICD-10-CM

## 2020-02-11 DIAGNOSIS — K529 Noninfective gastroenteritis and colitis, unspecified: Secondary | ICD-10-CM

## 2020-02-11 NOTE — Progress Notes (Signed)
Location: [facility] Room Number: 5 Place of Service:  SNF [phone])  Provider: Veleta Miners MD   Code Status: DNR Goals of Care:  Advanced Directives 02/03/2020  Does Patient Have a Medical Advance Directive? Yes  Type of Paramedic of [place];Out of facility DNR (pink MOST or yellow form);Living will  Does patient want to make changes to medical advance directive? No - Patient declined  Copy of [place] in Chart? Yes - validated most recent copy scanned in chart (See row information)  Would patient like information on creating a medical advance directive? -  Pre-existing out of facility DNR order (yellow form or pink MOST form) Yellow form placed in chart (order not valid for inpatient use)     Chief Complaint  Patient presents with  . Acute Visit    Falls    HPI: Patient is a 86 y.o. female seen today for an acute visit for Recurent Falls She fell over 4 times through this weekend Most of them have been sliding from the wheelchair  Patient is long term care resident Was admitted in the hospital from 6/2to6/6 for sepsis secondary to colitis. Wasalsoadmitted in the Hospital from 5/25-5/28 for RightFibula Tibia Fractures s/p IM rod Placement Patientalsohas h/o Hypertension, Hypothyroidism, Alzheimer's Dementia with behavior issues, Depression, B12 def, S/P Right Hip fracture with Intramedullary Fixation in 8/27  Now that she is more mobile she has had 4 falls over the weekend. Most of them have been sliding from her chair. No Injuries Patient unable to give any history due to her dementia     Past Medical History:  Diagnosis Date  . Alzheimer disease ([place]) 10/27/2016  . Arthritis   . Confusion 04/04/2019  . Depression, recurrent ([place]) 10/27/2016  . History of fracture of right hip 04/04/2019   07/01/19 Ortho, R hip incision healed, X-ray looks good, s/p IMN R hip fx, WBAT, f/u 6 mos.   Marland Kitchen HTN  (hypertension) 10/27/2016  . Hypertension   . Hypertensive kidney disease with CKD (chronic kidney disease) stage V ([place]) 11/02/2016   11/02/16 Na 136, K 4.7, Bun 30, creat 1.52, TSH 0.86, wbc 6.7, Hgb 11.3, plt 264  . Hypothyroidism 10/27/2016  . Osteoarthritis 10/27/2016  . Thyroid disease     Past Surgical History:  Procedure Laterality Date  . ABDOMINAL HYSTERECTOMY  1986  . INTRAMEDULLARY (IM) NAIL INTERTROCHANTERIC Right 04/04/2019   Procedure: INTRAMEDULLARY (IM) NAIL INTERTROCHANTRIC;  Surgeon: Rod Can, MD;  Location: [facility];  Service: Orthopedics;  Laterality: Right;  . KNEE ARTHROSCOPY  2015/2017   Dr. Gustavus Bryant  . SPINE SURGERY  2009, 2011   Dr.Mark Nicoletta Dress  . TIBIA IM NAIL INSERTION Right 01/01/2020   Procedure: INTRAMEDULLARY (IM) NAIL TIBIAL;  Surgeon: Shona Needles, MD;  Location: [facility];  Service: Orthopedics;  Laterality: Right;    No Known Allergies  Outpatient Encounter Medications as of 02/11/2020  Medication Sig  . acetaminophen (TYLENOL) 325 MG tablet Take 650 mg by mouth 2 (two) times daily.  . Amino Acids-Protein Hydrolys (FEEDING SUPPLEMENT, PRO-STAT SUGAR FREE 64,) LIQD Take 30 mLs by mouth daily.  Marland Kitchen ascorbic acid (VITAMIN C) 500 MG tablet Take 500 mg by mouth daily.  Marland Kitchen aspirin EC 81 MG tablet Take 81 mg by mouth daily.   . Calcium Carbonate (CALCIUM 600 PO) Take 1 tablet by mouth daily.  . cholecalciferol (VITAMIN D3) 25 MCG (1000 UT) tablet Take 2,000 Units by mouth daily.   Marland Kitchen  divalproex (DEPAKOTE) 125 MG DR tablet Take 125 mg by mouth in the morning and at bedtime.   . docusate sodium (COLACE) 100 MG capsule Take 1 capsule (100 mg total) by mouth 2 (two) times daily.  . DULoxetine (CYMBALTA) 30 MG capsule Take 30 mg by mouth daily.  . furosemide (LASIX) 20 MG tablet Take 20 mg by mouth daily.  Marland Kitchen levothyroxine (SYNTHROID, LEVOTHROID) 25 MCG tablet Take 25 mcg by mouth daily before breakfast.  . lisinopril (ZESTRIL) 10 MG tablet Take 10 mg by mouth daily.    Marland Kitchen LORazepam (ATIVAN) 0.5 MG tablet Take 1 tablet (0.5 mg total) by mouth at bedtime.  . memantine (NAMENDA) 10 MG tablet Take 10 mg by mouth 2 (two) times daily.  . ondansetron (ZOFRAN-ODT) 4 MG disintegrating tablet Take 4 mg by mouth every 8 (eight) hours as needed for nausea or vomiting.   . potassium chloride SA (KLOR-CON) 20 MEQ tablet Take 20 mEq by mouth 2 (two) times daily.  . QUEtiapine (SEROQUEL) 25 MG tablet Take 25 mg by mouth at bedtime.  . traMADol (ULTRAM) 50 MG tablet Take 0.5 tablets (25 mg total) by mouth every 6 (six) hours as needed.  . vitamin B-12 (CYANOCOBALAMIN) 1000 MCG tablet Take 1,000 mcg by mouth. Once a day on Monday and Wednesday  . [DISCONTINUED] acetaminophen (TYLENOL) 650 MG suppository Place 650 mg rectally every 4 (four) hours as needed for fever.   No facility-administered encounter medications on file as of 02/11/2020.    Review of Systems:  Review of Systems  Unable to perform ROS: Dementia    Health Maintenance  Topic Date Due  . TETANUS/TDAP  06/22/2027  . DEXA SCAN  Completed  . COVID-19 Vaccine  Completed  . PNA vac Low Risk Adult  Completed  . INFLUENZA VACCINE  Discontinued    Physical Exam: Vitals:   02/11/20 1517  BP: 130/70  Pulse: 81  Resp: 18  Temp: 97.8 F (36.6 C)  SpO2: 95%  Weight: 149 lb 1.6 oz (67.6 kg)  Height: 4\' 11"  (1.499 m)   Body mass index is 30.11 kg/m. Physical Exam Vitals reviewed.  Constitutional:      Appearance: Normal appearance.  HENT:     Head: Normocephalic.     Nose: Nose normal.     Mouth/Throat:     Mouth: Mucous membranes are moist.     Pharynx: Oropharynx is clear.  Eyes:     Pupils: Pupils are equal, round, and reactive to light.  Cardiovascular:     Rate and Rhythm: Normal rate and regular rhythm.     Pulses: Normal pulses.  Pulmonary:     Effort: Pulmonary effort is normal.     Breath sounds: Normal breath sounds.  Abdominal:     General: Abdomen is flat.     Palpations:  Abdomen is soft.  Musculoskeletal:     Cervical back: Neck supple.     Comments: Mild swelling Bilateral  Skin:    General: Skin is warm.  Neurological:     General: No focal deficit present.     Mental Status: She is alert.     Comments: Right leg in the Boot  Psychiatric:        Mood and Affect: Mood normal.     Labs reviewed: Basic Metabolic Panel: Recent Labs    04/11/19 0000 04/25/19 0000 01/09/20 0425 01/09/20 0425 01/10/20 0430 01/10/20 0430 01/11/20 0306 01/15/20 0000 01/21/20 0000  NA 140   < > 140   < >  140  --  140 142  --   K 4.2   < > 3.8   < > 3.8  --  3.6 3.2*  --   CL  --    < > 108   < > 107  --  110 109*  --   CO2  --    < > 23   < > 23  --  21*  --  28*  GLUCOSE  --    < > 103*  --  90  --  88  --   --   BUN 10   < > 27*   < > 19   < > 14 9 16   CREATININE 0.8   < > 0.98   < > 0.85   < > 0.74 0.8 1.0  CALCIUM  --    < > 8.8*   < > 8.6*   < > 8.6* 8.8 9.8  TSH 2.43  --   --   --   --   --   --   --   --    < > = values in this interval not displayed.   Liver Function Tests: Recent Labs    01/08/20 1448 01/09/20 0425 01/11/20 0306  AST 14* 11* 11*  ALT 10 9 9   ALKPHOS 58 49 62  BILITOT 0.7 0.6 0.8  PROT 5.9* 5.4* 5.4*  ALBUMIN 2.6* 2.5* 2.5*   No results for input(s): LIPASE, AMYLASE in the last 8760 hours. No results for input(s): AMMONIA in the last 8760 hours. CBC: Recent Labs    11/07/19 0000 12/31/19 1900 01/03/20 0615 01/07/20 0000 01/08/20 1448 01/08/20 2035 01/09/20 0425 01/09/20 0425 01/10/20 0430 01/11/20 0306 01/21/20 0000  WBC 5.3   < >   < > 8.3 15.8*   < > 12.6*   < > 15.1* 12.1* 9.0  NEUTROABS 2,417  --   --  4,856 12.2*  --   --   --   --   --   --   HGB 11.8*   < >   < > 9.0* 8.4*   < > 6.9*   < > 9.9* 10.8* 11.5*  HCT 36   < >   < > 27* 27.4*   < > 22.8*   < > 29.8* 34.0* 35*  MCV  --    < >   < >  --  97.5   < > 98.7  --  87.9 91.2  --   PLT 221   < >   < > 318 260   < > 207   < > 217 266 389   < > = values  in this interval not displayed.   Lipid Panel: No results for input(s): CHOL, HDL, LDLCALC, TRIG, CHOLHDL, LDLDIRECT in the last 8760 hours. Lab Results  Component Value Date   HGBA1C 5.3 11/01/2016    Procedures since last visit: No results found.  Assessment/Plan Recurent Falls Change her Ativan to 0.25 mg and make it PRN Will eventually like to taper her off trazadone Already reduced the Depakote to 125 mg BID Also d/w the Nurse in charge for more Supervision  Other issues Bilateral leg edema Doing well on Lasix BUN and Creat stable  S/P  IM Nailing for Tibial Fracture Pain Controlled on tramadol WBAT Continue to wear RLE Brace  Mixed Alzheimer's and vascular dementia ([place]) On Namenda and Seroquel Essential hypertension Controlled on lisinopril ACP MOST Form Signed. She  is Comfort care  Labs/tests ordered:  * No order type specified * Next appt:  Visit date not found

## 2020-02-17 ENCOUNTER — Encounter: Payer: Self-pay | Admitting: Nurse Practitioner

## 2020-02-17 ENCOUNTER — Non-Acute Institutional Stay (SKILLED_NURSING_FACILITY): Payer: Medicare Other | Admitting: Nurse Practitioner

## 2020-02-17 DIAGNOSIS — G309 Alzheimer's disease, unspecified: Secondary | ICD-10-CM

## 2020-02-17 DIAGNOSIS — E039 Hypothyroidism, unspecified: Secondary | ICD-10-CM

## 2020-02-17 DIAGNOSIS — R296 Repeated falls: Secondary | ICD-10-CM | POA: Diagnosis not present

## 2020-02-17 DIAGNOSIS — R269 Unspecified abnormalities of gait and mobility: Secondary | ICD-10-CM | POA: Diagnosis not present

## 2020-02-17 DIAGNOSIS — F339 Major depressive disorder, recurrent, unspecified: Secondary | ICD-10-CM

## 2020-02-17 DIAGNOSIS — F0150 Vascular dementia without behavioral disturbance: Secondary | ICD-10-CM

## 2020-02-17 DIAGNOSIS — R609 Edema, unspecified: Secondary | ICD-10-CM

## 2020-02-17 DIAGNOSIS — I10 Essential (primary) hypertension: Secondary | ICD-10-CM | POA: Diagnosis not present

## 2020-02-17 DIAGNOSIS — F0280 Dementia in other diseases classified elsewhere without behavioral disturbance: Secondary | ICD-10-CM

## 2020-02-17 NOTE — Progress Notes (Addendum)
Location:   [facility] Room Number: 42 Place of Service:  SNF (31) Provider:  Laurel Harnden, Lennie Odor NP   Haley Dad, MD  Patient Care Team: Haley Dad, MD as PCP - General (Internal Medicine) Khaidyn Staebell X, NP as Nurse Practitioner (Internal Medicine) Haley Dad, MD (Internal Medicine)  Extended Emergency Contact Information Primary Emergency Contact: [facility] Address: [address]          [place], [place] [address] [name] of [place] Phone: [phone] Relation: Son Secondary Emergency Contact: [facility] Address: [address] [place] [address] [name] Phone: [phone] Relation: Daughter  Code Status:  DNR Goals of care: Advanced Directive information Advanced Directives 03/11/2020  Does Patient Have a Medical Advance Directive? Yes  Type of Paramedic of [place];Living will;Out of facility DNR (pink MOST or yellow form)  Does patient want to make changes to medical advance directive? No - Patient declined  Copy of [place] in Chart? Yes - validated most recent copy scanned in chart (See row information)  Would patient like information on creating a medical advance directive? -  Pre-existing out of facility DNR order (yellow form or pink MOST form) Yellow form placed in chart (order not valid for inpatient use)     Chief Complaint  Patient presents with   Acute Visit    Fall    HPI:  Pt is a 86 y.o. female seen today for an acute visit for reported fall 02/17/20 appears the patient slid out of chair when she attempted to transfer self and sat on floor. The patient has limited safety awareness due to her advanced dementia, she needs assistance with transfer but does not remember to call for help.    RLE s/p ORIF IM nailing for tibial fx, , in surgical boot, takes Tylenol 650mg  bid, prn Tramadol             Dementia, takes Memantine 10mg  bid.               Chronic edema BLE, R>L, takes Furosemide 20mg  qd.              Depression/anxiety, takes Depakote 125mg  bid, Duloxetine 30mg  qd, Lorazepam 0.25mg  prn, Quetiapine 25mg  qd             Hypothyroidism, takes Levothyroxine 73mcg qd              HTN, takes Lisinopril 10mg  qd.     Past Medical History:  Diagnosis Date   Alzheimer disease ([place]) 10/27/2016   Arthritis    Confusion 04/04/2019   Depression, recurrent ([place]) 10/27/2016   History of fracture of right hip 04/04/2019   07/01/19 Ortho, R hip incision healed, X-ray looks good, s/p IMN R hip fx, WBAT, f/u 6 mos.    HTN (hypertension) 10/27/2016   Hypertension    Hypertensive kidney disease with CKD (chronic kidney disease) stage V ([place]) 11/02/2016   11/02/16 Na 136, K 4.7, Bun 30, creat 1.52, TSH 0.86, wbc 6.7, Hgb 11.3, plt 264   Hypothyroidism 10/27/2016   Osteoarthritis 10/27/2016   Thyroid disease    Past Surgical History:  Procedure Laterality Date   ABDOMINAL HYSTERECTOMY  1986   INTRAMEDULLARY (IM) NAIL INTERTROCHANTERIC Right 04/04/2019   Procedure: INTRAMEDULLARY (IM) NAIL INTERTROCHANTRIC;  Surgeon: Rod Can, MD;  [facility];  Service: Orthopedics;  Laterality: Right;   KNEE ARTHROSCOPY  2015/2017   Dr. Gustavus Bryant   SPINE SURGERY  2009, 2011   Dr.Mark Nicoletta Dress   TIBIA IM NAIL INSERTION Right 01/01/2020   Procedure: INTRAMEDULLARY (IM) NAIL TIBIAL;  Surgeon: Shona Needles, MD;  Location: [facility];  Service: Orthopedics;  Laterality: Right;    No Known Allergies  Allergies as of 02/17/2020   No Known Allergies     Medication List       Accurate as of February 17, 2020 11:59 PM. If you have any questions, ask your nurse or doctor.        acetaminophen 325 MG tablet Commonly known as: TYLENOL Take 650 mg by mouth 2 (two) times daily.   ascorbic acid 500 MG tablet Commonly known as: VITAMIN C Take 500 mg by mouth daily.   aspirin EC 81 MG tablet Take 81 mg by mouth daily.   CALCIUM 600  PO Take 1 tablet by mouth daily.   cholecalciferol 25 MCG (1000 UNIT) tablet Commonly known as: VITAMIN D3 Take 2,000 Units by mouth daily.   divalproex 125 MG DR tablet Commonly known as: DEPAKOTE Take 125 mg by mouth in the morning and at bedtime.   docusate sodium 100 MG capsule Commonly known as: COLACE Take 1 capsule (100 mg total) by mouth 2 (two) times daily.   DULoxetine 30 MG capsule Commonly known as: CYMBALTA Take 30 mg by mouth daily.   feeding supplement (PRO-STAT SUGAR FREE 64) Liqd Take 30 mLs by mouth daily.   furosemide 20 MG tablet Commonly known as: LASIX Take 20 mg by mouth daily.   levothyroxine 25 MCG tablet Commonly known as: SYNTHROID Take 25 mcg by mouth daily before breakfast.   lisinopril 10 MG tablet Commonly known as: ZESTRIL Take 10 mg by mouth daily.   LORazepam 0.5 MG tablet Commonly known as: ATIVAN Take 0.25 mg by mouth at bedtime. 0.25=1/2tab What changed: Another medication with the same name was removed. Continue taking this medication, and follow the directions you see here. Changed by: Lakita Sahlin X Raphael Fitzpatrick, NP   memantine 10 MG tablet Commonly known as: NAMENDA Take 10 mg by mouth 2 (two) times daily.   ondansetron 4 MG disintegrating tablet Commonly known as: ZOFRAN-ODT Take 4 mg by mouth every 8 (eight) hours as needed for nausea or vomiting.   potassium chloride SA 20 MEQ tablet Commonly known as: KLOR-CON Take 20 mEq by mouth 2 (two) times daily.   QUEtiapine 25 MG tablet Commonly known as: SEROQUEL Take 25 mg by mouth at bedtime.   traMADol 50 MG tablet Commonly known as: ULTRAM Take 0.5 tablets (25 mg total) by mouth every 6 (six) hours as needed.   vitamin B-12 1000 MCG tablet Commonly known as: CYANOCOBALAMIN Take 1,000 mcg by mouth. Once a day on Monday and Wednesday       Review of Systems  Constitutional: Negative for appetite change, fatigue and fever.  HENT: Positive for hearing loss. Negative for  congestion and voice change.   Eyes: Negative for visual disturbance.  Respiratory: Negative for cough and shortness of breath.   Cardiovascular: Positive for leg swelling.  Gastrointestinal: Negative for abdominal pain and diarrhea.  Genitourinary: Negative for dysuria, frequency and urgency.  Musculoskeletal: Positive for arthralgias and gait problem.       S/p R lower leg IM tibia  Skin: Negative for color change.  Neurological: Negative for speech difficulty, weakness and light-headedness.       Dementia, lethargic.   Psychiatric/Behavioral: Positive for confusion and  sleep disturbance. Negative for behavioral problems. The patient is nervous/anxious.     Immunization History  Administered Date(s) Administered   Influenza-Unspecified 05/29/2017   Moderna SARS-COVID-2 Vaccination 09/07/2019, 10/05/2019   Pneumococcal Conjugate-13 06/21/2017   Pneumococcal Polysaccharide-23 07/16/2018   Tdap 06/21/2017   Pertinent  Health Maintenance Due  Topic Date Due   DEXA SCAN  Completed   PNA vac Low Risk Adult  Completed   INFLUENZA VACCINE  Discontinued   Fall Risk  04/27/2018 04/25/2017  Falls in the past year? No No   Functional Status Survey:    Vitals:   02/17/20 1202  BP: 122/74  Pulse: 79  Resp: 20  Temp: 99 F (37.2 C)  SpO2: 95%  Weight: 150 lb 9.6 oz (68.3 kg)  Height: 4\' 11"  (1.499 m)   Body mass index is 30.42 kg/m. Physical Exam Vitals and nursing note reviewed.  Constitutional:      Appearance: Normal appearance.  HENT:     Head: Normocephalic and atraumatic.     Mouth/Throat:     Mouth: Mucous membranes are moist.  Eyes:     Extraocular Movements: Extraocular movements intact.     Conjunctiva/sclera: Conjunctivae normal.     Pupils: Pupils are equal, round, and reactive to light.  Cardiovascular:     Rate and Rhythm: Normal rate and regular rhythm.     Heart sounds: No murmur heard.   Pulmonary:     Effort: Pulmonary effort is normal.      Breath sounds: No rales.  Abdominal:     General: Bowel sounds are normal.     Tenderness: There is no abdominal tenderness.  Musculoskeletal:        General: Tenderness present.     Cervical back: Normal range of motion and neck supple.     Right lower leg: Edema present.     Left lower leg: No edema.     Comments: Surgical boot RLE, s/p IM rod for tibia fx  Skin:    General: Skin is warm and dry.  Neurological:     General: No focal deficit present.     Mental Status: She is alert.     Gait: Gait abnormal.  Psychiatric:        Mood and Affect: Mood normal.     Labs reviewed: Recent Labs    01/09/20 0425 01/09/20 0425 01/10/20 0430 01/10/20 0430 01/11/20 0306 01/15/20 0000 01/21/20 0000  NA 140   < > 140  --  140 142  --   K 3.8   < > 3.8  --  3.6 3.2*  --   CL 108   < > 107  --  110 109*  --   CO2 23   < > 23  --  21*  --  28*  GLUCOSE 103*  --  90  --  88  --   --   BUN 27*   < > 19   < > 14 9 16   CREATININE 0.98   < > 0.85   < > 0.74 0.8 1.0  CALCIUM 8.8*   < > 8.6*   < > 8.6* 8.8 9.8   < > = values in this interval not displayed.   Recent Labs    01/08/20 1448 01/09/20 0425 01/11/20 0306  AST 14* 11* 11*  ALT 10 9 9   ALKPHOS 58 49 62  BILITOT 0.7 0.6 0.8  PROT 5.9* 5.4* 5.4*  ALBUMIN 2.6* 2.5* 2.5*   Recent Labs  11/07/19 0000 12/31/19 1900 01/03/20 0615 01/07/20 0000 01/08/20 1448 01/08/20 2035 01/09/20 0425 01/09/20 0425 01/10/20 0430 01/11/20 0306 01/21/20 0000  WBC 5.3   < >   < > 8.3 15.8*   < > 12.6*   < > 15.1* 12.1* 9.0  NEUTROABS 2,417  --   --  4,856 12.2*  --   --   --   --   --   --   HGB 11.8*   < >   < > 9.0* 8.4*   < > 6.9*   < > 9.9* 10.8* 11.5*  HCT 36   < >   < > 27* 27.4*   < > 22.8*   < > 29.8* 34.0* 35*  MCV  --    < >   < >  --  97.5   < > 98.7  --  87.9 91.2  --   PLT 221   < >   < > 318 260   < > 207   < > 217 266 389   < > = values in this interval not displayed.   Lab Results  Component Value Date   TSH 2.43  04/11/2019   Lab Results  Component Value Date   HGBA1C 5.3 11/01/2016   No results found for: CHOL, HDL, LDLCALC, LDLDIRECT, TRIG, CHOLHDL  Significant Diagnostic Results in last 30 days:  No results found.  Assessment/Plan Recurrent falls Reported fall 02/17/20 appears the patient slid out of chair when she attempted to transfer self and sat on floor. The patient has limited safety awareness due to her advanced dementia, she needs assistance with transfer but does not remember to call for help. Close supervision/assistance for safety needed.   Gait abnormality Persisted problem since right tibia/fibula fx, healing, WBAT, RLE s/p ORIF IM nailing for tibial fx, , in surgical boot, takes Tylenol 650mg  bid, prn Tramadol. Fall risk due to frailty, dementia w/o limited safety awareness.    Mixed Alzheimer's and vascular dementia ([place]) Continue SNF [facility] for safety, care assistance, continue Memantine for memory, the patient needs close supervision/assistance for transfer.   Essential hypertension Blood pressure is controlled, continue Lisinopril.   Hypothyroidism Stable, continue Levothyroxine. TSH wnl 04/2019  Depression, recurrent ([place]) Her mood is managed, but impulsiveness persists, continue Depakote 125mg  bid, Duloxetine 30mg  qd, Lorazepam 0.25mg  prn, Quetiapine 25mg  qd   Edema Minimal, continue Furosemide.     Family/ staff Communication: plan of care reviewed with the patient and charge nurse.   Labs/tests ordered:  none  Time spend 25 minutes.

## 2020-02-17 NOTE — Assessment & Plan Note (Signed)
Reported fall 02/17/20 appears the patient slid out of chair when she attempted to transfer self and sat on floor. The patient has limited safety awareness due to her advanced dementia, she needs assistance with transfer but does not remember to call for help. Close supervision/assistance for safety needed.

## 2020-02-18 ENCOUNTER — Encounter: Payer: Self-pay | Admitting: Nurse Practitioner

## 2020-02-18 NOTE — Assessment & Plan Note (Addendum)
Her mood is managed, but impulsiveness persists, continue Depakote 125mg  bid, Duloxetine 30mg  qd, Lorazepam 0.25mg  prn, Quetiapine 25mg  qd

## 2020-02-18 NOTE — Assessment & Plan Note (Signed)
Minimal, continue Furosemide  °

## 2020-02-18 NOTE — Assessment & Plan Note (Addendum)
Persisted problem since right tibia/fibula fx, healing, WBAT, RLE s/p ORIF IM nailing for tibial fx, , in surgical boot, takes Tylenol 650mg  bid, prn Tramadol. Fall risk due to frailty, dementia w/o limited safety awareness.

## 2020-02-18 NOTE — Assessment & Plan Note (Signed)
Blood pressure is controlled, continue Lisinopril.  

## 2020-02-18 NOTE — Assessment & Plan Note (Signed)
Stable, continue Levothyroxine. TSH wnl 04/2019

## 2020-02-18 NOTE — Assessment & Plan Note (Signed)
Continue SN[facility] for safety, care assistance, continue Memantine for memory, the patient needs close supervision/assistance for transfer.

## 2020-03-02 DIAGNOSIS — S82201D Unspecified fracture of shaft of right tibia, subsequent encounter for closed fracture with routine healing: Secondary | ICD-10-CM | POA: Diagnosis not present

## 2020-03-11 ENCOUNTER — Encounter: Payer: Self-pay | Admitting: Nurse Practitioner

## 2020-03-11 ENCOUNTER — Non-Acute Institutional Stay (SKILLED_NURSING_FACILITY): Payer: Medicare Other | Admitting: Nurse Practitioner

## 2020-03-11 DIAGNOSIS — F99 Mental disorder, not otherwise specified: Secondary | ICD-10-CM

## 2020-03-11 DIAGNOSIS — E039 Hypothyroidism, unspecified: Secondary | ICD-10-CM

## 2020-03-11 DIAGNOSIS — R634 Abnormal weight loss: Secondary | ICD-10-CM

## 2020-03-11 DIAGNOSIS — F5105 Insomnia due to other mental disorder: Secondary | ICD-10-CM

## 2020-03-11 DIAGNOSIS — M159 Polyosteoarthritis, unspecified: Secondary | ICD-10-CM | POA: Diagnosis not present

## 2020-03-11 DIAGNOSIS — F0280 Dementia in other diseases classified elsewhere without behavioral disturbance: Secondary | ICD-10-CM

## 2020-03-11 DIAGNOSIS — G309 Alzheimer's disease, unspecified: Secondary | ICD-10-CM

## 2020-03-11 DIAGNOSIS — F0150 Vascular dementia without behavioral disturbance: Secondary | ICD-10-CM

## 2020-03-11 DIAGNOSIS — I10 Essential (primary) hypertension: Secondary | ICD-10-CM | POA: Diagnosis not present

## 2020-03-11 DIAGNOSIS — F339 Major depressive disorder, recurrent, unspecified: Secondary | ICD-10-CM | POA: Diagnosis not present

## 2020-03-11 DIAGNOSIS — R609 Edema, unspecified: Secondary | ICD-10-CM

## 2020-03-11 NOTE — Assessment & Plan Note (Signed)
RLE s/p ORIF IM nailing for tibial fx, , in surgical boot, takes Tylenol 650mg  bid, prn Tramadol

## 2020-03-11 NOTE — Assessment & Plan Note (Signed)
Persisted issue, continue Quetiapine, Depakote, Lorazepam, Duloxetine, update CMP/eGFR, CBC/diff.

## 2020-03-11 NOTE — Assessment & Plan Note (Addendum)
Continue Depakote 125mg  bid, Duloxetine 30mg  qd, Lorazepam 0.25mg  hs prn used mostly every night- little efficacy-the patient's irritable/anxious/restlessness in pm, Tramadol administered for possible pain in RLE,  , continue Quetiapine 25mg  qd, add prn Seroquel 25mg  daily x 14 days.

## 2020-03-11 NOTE — Assessment & Plan Note (Signed)
TSH wnl 04/2019, continue Levothryoxine, update TSH

## 2020-03-11 NOTE — Assessment & Plan Note (Signed)
Minimal edema BLE, continue Furosemide.  

## 2020-03-11 NOTE — Assessment & Plan Note (Signed)
#  7 Ibs in the past month, f/u dietary, update CBC/diff, CMP/eGFR, TSH

## 2020-03-11 NOTE — Progress Notes (Addendum)
Location:   [facility] Room Number: [place] of Service:  SNF (31) Provider:  Marlana Latus, NP  [name], MD  Patient Care Team: [name], MD as PCP - General (Internal Medicine) Caulin Begley X, NP as Nurse Practitioner (Internal Medicine) [name], MD (Internal Medicine)  Extended Emergency Contact Information Primary Emergency Contact: [facility] Address: [address]          [place], [place] [address] [name] of [place] Phone: [phone] Relation: Son Secondary Emergency Contact: [facility] Address: [address] [place] [address] [name] Phone: [phone] Relation: Daughter  Code Status:  DNR Goals of care: Advanced Directive information Advanced Directives 03/11/2020  Does Patient Have a Medical Advance Directive? Yes  Type of Paramedic of [place];Living will;Out of facility DNR (pink MOST or yellow form)  Does patient want to make changes to medical advance directive? No - Patient declined  Copy of [place] in Chart? Yes - validated most recent copy scanned in chart (See row information)  Would patient like information on creating a medical advance directive? -  Pre-existing out of facility DNR order (yellow form or pink MOST form) Yellow form placed in chart (order not valid for inpatient use)     Chief Complaint  Patient presents with  . Medical Management of Chronic Issues    Routine follow up    HPI:  Pt is a 86 y.o. female seen today for medical management of chronic diseases.    RLE s/p ORIF IM nailing for tibial fx, , in surgical boot, takes Tylenol 640m bid, prn Tramadol Dementia, takes Memantine 194mbid.  Chronic edema BLE, R>L, takes Furosemide 208md.  Depression/anxiety, takes Depakote 125m46md, Duloxetine 30mg58m Lorazepam 0.25mg 72mrn, used almost daily, Quetiapine 25mg  86mypothyroidism, takes Levothyroxine 25mcg q32mTN, takes Lisinopril 10mg qd.71mPast Medical History:  Diagnosis Date  . Alzheimer disease (HCC) 03/2[place]018  . Arthritis   . Confusion 04/04/2019  . Depression, recurrent (HCC) 03/2[place]018  . History of fracture of right hip 04/04/2019   07/01/19 Ortho, R hip incision healed, X-ray looks good, s/p IMN R hip fx, WBAT, f/u 6 mos.   . HTN (hy[place] Kitchenertension) 10/27/2016  . Hypertension   . Hypertensive kidney disease with CKD (chronic kidney disease) stage V (HCC) 03/2[place]018   11/02/16 Na 136, K 4.7, Bun 30, creat 1.52, TSH 0.86, wbc 6.7, Hgb 11.3, plt 264  . Hypothyroidism 10/27/2016  . Osteoarthritis 10/27/2016  . Thyroid disease    Past Surgical History:  Procedure Laterality Date  . ABDOMINAL HYSTERECTOMY  1986  . INTRAMEDULLARY (IM) NAIL INTERTROCHANTERIC Right 04/04/2019   Procedure: INTRAMEDULLARY (IM) NAIL INTERTROCHANTRIC;  Surgeon: Swinteck,Rod Cancation: [facility];  Service: Orthopedics;  Laterality: Right;  . KNEE ARTHROSCOPY  2015/2017   Dr. Loiby  . Gustavus Bryant SURGERY  2009, 2011   Dr.Mark Li  . TIBNicoletta Dress IM NAIL INSERTION Right 01/01/2020   Procedure: INTRAMEDULLARY (IM) NAIL TIBIAL;  Surgeon: Haddix, KShona Needlescation: [facility] OR;  [place]: Orthopedics;  Laterality: Right;    No Known Allergies  Allergies as of 03/11/2020   No Known Allergies     Medication List       Accurate as of March 11, 2020  4:22 PM. If you have any questions, ask your nurse or doctor.  acetaminophen 325 MG tablet Commonly known as: TYLENOL Take 650 mg by mouth 2 (two) times daily.   ascorbic acid 500 MG tablet Commonly known as: VITAMIN C Take 500 mg by mouth daily.   aspirin EC 81 MG tablet Take 81 mg by mouth daily.   CALCIUM 600 PO Take 1 tablet by mouth daily.   cholecalciferol 25 MCG (1000 UNIT) tablet Commonly known as: VITAMIN D3 Take 2,000 Units by mouth daily.   divalproex 125 MG DR  tablet Commonly known as: DEPAKOTE Take 125 mg by mouth in the morning and at bedtime.   docusate sodium 100 MG capsule Commonly known as: COLACE Take 1 capsule (100 mg total) by mouth 2 (two) times daily.   DULoxetine 30 MG capsule Commonly known as: CYMBALTA Take 30 mg by mouth daily.   feeding supplement (PRO-STAT SUGAR FREE 64) Liqd Take 30 mLs by mouth daily.   furosemide 20 MG tablet Commonly known as: LASIX Take 20 mg by mouth daily.   levothyroxine 25 MCG tablet Commonly known as: SYNTHROID Take 25 mcg by mouth daily before breakfast.   lisinopril 10 MG tablet Commonly known as: ZESTRIL Take 10 mg by mouth daily.   LORazepam 0.5 MG tablet Commonly known as: ATIVAN Take 0.25 mg by mouth as needed. At bedtime   memantine 10 MG tablet Commonly known as: NAMENDA Take 10 mg by mouth 2 (two) times daily.   ondansetron 4 MG disintegrating tablet Commonly known as: ZOFRAN-ODT Take 4 mg by mouth every 8 (eight) hours as needed for nausea or vomiting.   potassium chloride SA 20 MEQ tablet Commonly known as: KLOR-CON Take 20 mEq by mouth 2 (two) times daily.   QUEtiapine 25 MG tablet Commonly known as: SEROQUEL Take 25 mg by mouth at bedtime.   traMADol 50 MG tablet Commonly known as: ULTRAM Take 0.5 tablets (25 mg total) by mouth every 6 (six) hours as needed.   vitamin B-12 1000 MCG tablet Commonly known as: CYANOCOBALAMIN Take 1,000 mcg by mouth. Once a day on Monday and Wednesday       Review of Systems  Constitutional: Positive for unexpected weight change. Negative for fatigue and fever.       Weight loss about #7Ibs in the past month  HENT: Positive for hearing loss. Negative for congestion and voice change.   Eyes: Negative for visual disturbance.  Respiratory: Negative for cough, shortness of breath and wheezing.   Cardiovascular: Positive for leg swelling.  Gastrointestinal: Negative for abdominal pain and diarrhea.  Genitourinary: Negative for  difficulty urinating, dysuria, frequency and urgency.  Musculoskeletal: Positive for arthralgias and gait problem.       S/p R lower leg IM tibia  Skin: Negative for color change.  Neurological: Negative for speech difficulty, weakness and light-headedness.       Dementia, lethargic.   Psychiatric/Behavioral: Positive for confusion and sleep disturbance. Negative for behavioral problems. The patient is nervous/anxious.     Immunization History  Administered Date(s) Administered  . Influenza-Unspecified 05/29/2017  . Moderna SARS-COVID-2 Vaccination 09/07/2019, 10/05/2019  . Pneumococcal Conjugate-13 06/21/2017  . Pneumococcal Polysaccharide-23 07/16/2018  . Tdap 06/21/2017   Pertinent  Health Maintenance Due  Topic Date Due  . DEXA SCAN  Completed  . PNA vac Low Risk Adult  Completed  . INFLUENZA VACCINE  Discontinued   Fall Risk  04/27/2018 04/25/2017  Falls in the past year? No No   Functional Status Survey:    Vitals:   03/11/20 1125  BP: 120/70  Pulse: 78  Resp: 18  Temp: 98.1 F (36.7 C)  SpO2: 99%  Weight: 143 lb 14.4 oz (65.3 kg)  Height: 4' 11"  (1.499 m)   Body mass index is 29.06 kg/m. Physical Exam Vitals and nursing note reviewed.  Constitutional:      Appearance: Normal appearance.  HENT:     Head: Normocephalic and atraumatic.     Mouth/Throat:     Mouth: Mucous membranes are moist.  Eyes:     Extraocular Movements: Extraocular movements intact.     Conjunctiva/sclera: Conjunctivae normal.     Pupils: Pupils are equal, round, and reactive to light.  Cardiovascular:     Rate and Rhythm: Regular rhythm. Tachycardia present.     Heart sounds: No murmur heard.   Pulmonary:     Effort: Pulmonary effort is normal.     Breath sounds: No rales.  Abdominal:     General: Bowel sounds are normal.     Palpations: Abdomen is soft.     Tenderness: There is no abdominal tenderness.  Musculoskeletal:        General: No tenderness.     Cervical back:  Normal range of motion and neck supple.     Right lower leg: Edema present.     Left lower leg: No edema.     Comments: R s/p IM rod for tibia fx. Minimal swelling RLE  Skin:    General: Skin is warm and dry.  Neurological:     General: No focal deficit present.     Mental Status: She is alert.     Coordination: Coordination normal.     Gait: Gait abnormal.  Psychiatric:        Mood and Affect: Mood normal.     Labs reviewed: Recent Labs    01/09/20 0425 01/09/20 0425 01/10/20 0430 01/10/20 0430 01/11/20 0306 01/15/20 0000 01/21/20 0000  NA 140   < > 140  --  140 142  --   K 3.8   < > 3.8  --  3.6 3.2*  --   CL 108   < > 107  --  110 109*  --   CO2 23   < > 23  --  21*  --  28*  GLUCOSE 103*  --  90  --  88  --   --   BUN 27*   < > 19   < > 14 9 16   CREATININE 0.98   < > 0.85   < > 0.74 0.8 1.0  CALCIUM 8.8*   < > 8.6*   < > 8.6* 8.8 9.8   < > = values in this interval not displayed.   Recent Labs    01/08/20 1448 01/09/20 0425 01/11/20 0306  AST 14* 11* 11*  ALT 10 9 9   ALKPHOS 58 49 62  BILITOT 0.7 0.6 0.8  PROT 5.9* 5.4* 5.4*  ALBUMIN 2.6* 2.5* 2.5*   Recent Labs    11/07/19 0000 12/31/19 1900 01/03/20 0615 01/07/20 0000 01/08/20 1448 01/08/20 2035 01/09/20 0425 01/09/20 0425 01/10/20 0430 01/11/20 0306 01/21/20 0000  WBC 5.3   < >   < > 8.3 15.8*   < > 12.6*   < > 15.1* 12.1* 9.0  NEUTROABS 2,417  --   --  4,856 12.2*  --   --   --   --   --   --   HGB 11.8*   < >   < > 9.0*  8.4*   < > 6.9*   < > 9.9* 10.8* 11.5*  HCT 36   < >   < > 27* 27.4*   < > 22.8*   < > 29.8* 34.0* 35*  MCV  --    < >   < >  --  97.5   < > 98.7  --  87.9 91.2  --   PLT 221   < >   < > 318 260   < > 207   < > 217 266 389   < > = values in this interval not displayed.   Lab Results  Component Value Date   TSH 2.43 04/11/2019   Lab Results  Component Value Date   HGBA1C 5.3 11/01/2016   No results found for: CHOL, HDL, LDLCALC, LDLDIRECT, TRIG,  CHOLHDL  Significant Diagnostic Results in last 30 days:  No results found.  Assessment/Plan Essential hypertension Blood pressure is controlled, continue Lisinopril.   Mixed Alzheimer's and vascular dementia ([place]) MOST comfort measures 01/20/20, continue Memantine   Hypothyroidism TSH wnl 04/2019, continue Levothryoxine, update TSH  Osteoarthritis RLE s/p ORIF IM nailing for tibial fx, , in surgical boot, takes Tylenol 679m bid, prn Tramadol   Depression, recurrent (HCC) Continue Depakote 1252mbid, Duloxetine 3029md, Lorazepam 0.53m15m prn used mostly every night- little efficacy-the patient's irritable/anxious/restlessness in pm, Tramadol administered for possible pain in RLE,  , continue Quetiapine 53mg71m add prn Seroquel 53mg 30my x 14 days.    Edema Minimal edema BLE, continue Furosemide.   Insomnia Persisted issue, continue Quetiapine, Depakote, Lorazepam, Duloxetine, update CMP/eGFR, CBC/diff.   Weight loss #7 Ibs in the past month, f/u dietary, update CBC/diff, CMP/eGFR, TSH     Family/ staff Communication: plan of care reviewed with the patient and charge nurse.   Labs/tests ordered: CBC/diff, CMP/eGFR, TSH  Time spend 35 minutes.

## 2020-03-11 NOTE — Assessment & Plan Note (Signed)
Blood pressure is controlled, continue Lisinopril.  

## 2020-03-11 NOTE — Assessment & Plan Note (Signed)
MOST comfort measures 01/20/20, continue Memantine

## 2020-03-12 LAB — BASIC METABOLIC PANEL
BUN: 20 (ref 4–21)
CO2: 30 — AB (ref 13–22)
Chloride: 105 (ref 99–108)
Creatinine: 1.2 — AB (ref 0.5–1.1)
Glucose: 91
Potassium: 4.6 (ref 3.4–5.3)
Sodium: 139 (ref 137–147)

## 2020-03-12 LAB — CBC AND DIFFERENTIAL
HCT: 34 — AB (ref 36–46)
Hemoglobin: 11 — AB (ref 12.0–16.0)
Neutrophils Absolute: 3902
Platelets: 234 (ref 150–399)
WBC: 7.2

## 2020-03-12 LAB — HEPATIC FUNCTION PANEL
ALT: 6 — AB (ref 7–35)
AST: 9 — AB (ref 13–35)
Alkaline Phosphatase: 107 (ref 25–125)
Bilirubin, Total: 0.3

## 2020-03-12 LAB — TSH: TSH: 1.58 (ref 0.41–5.90)

## 2020-03-12 LAB — COMPREHENSIVE METABOLIC PANEL
Albumin: 3.5 (ref 3.5–5.0)
Calcium: 10.1 (ref 8.7–10.7)
GFR calc Af Amer: 48
GFR calc non Af Amer: 41
Globulin: 3

## 2020-03-12 LAB — CBC: RBC: 3.74 — AB (ref 3.87–5.11)

## 2020-04-06 ENCOUNTER — Encounter: Payer: Self-pay | Admitting: Nurse Practitioner

## 2020-04-06 ENCOUNTER — Non-Acute Institutional Stay (SKILLED_NURSING_FACILITY): Payer: Medicare Other | Admitting: Nurse Practitioner

## 2020-04-06 DIAGNOSIS — F0150 Vascular dementia without behavioral disturbance: Secondary | ICD-10-CM | POA: Diagnosis not present

## 2020-04-06 DIAGNOSIS — F419 Anxiety disorder, unspecified: Secondary | ICD-10-CM | POA: Diagnosis not present

## 2020-04-06 DIAGNOSIS — E039 Hypothyroidism, unspecified: Secondary | ICD-10-CM

## 2020-04-06 DIAGNOSIS — F329 Major depressive disorder, single episode, unspecified: Secondary | ICD-10-CM

## 2020-04-06 DIAGNOSIS — R609 Edema, unspecified: Secondary | ICD-10-CM

## 2020-04-06 DIAGNOSIS — I10 Essential (primary) hypertension: Secondary | ICD-10-CM | POA: Diagnosis not present

## 2020-04-06 DIAGNOSIS — F32A Depression, unspecified: Secondary | ICD-10-CM

## 2020-04-06 DIAGNOSIS — G309 Alzheimer's disease, unspecified: Secondary | ICD-10-CM

## 2020-04-06 DIAGNOSIS — F0280 Dementia in other diseases classified elsewhere without behavioral disturbance: Secondary | ICD-10-CM

## 2020-04-06 NOTE — Assessment & Plan Note (Addendum)
Depression, anxiety, will increase Depakote 231m bid, update CBC/diff, CMP/eGFR. Continue Duloxetine 362mqd, Lorazepam 0.2541m prn, used almost daily, Quetiapine 61m75m

## 2020-04-06 NOTE — Progress Notes (Signed)
Location:   SNF [place] Room Number: 42-A Place of Service:  SNF (31) Provider: [facility] NP  [name], MD  Patient Care Team: [name], MD as PCP - General (Internal Medicine) Maryuri Warnke X, NP as Nurse Practitioner (Internal Medicine) [name], MD (Internal Medicine)  Extended Emergency Contact Information Primary Emergency Contact: [facility] Address: [address]          [place], [place] [address] [name] of [place] Phone: [phone] Relation: Son Secondary Emergency Contact: [facility] Address: [address] [place] [address] [name] Phone: [phone] Relation: Daughter  Code Status: DNR Goals of care: Advanced Directive information Advanced Directives 04/06/2020  Does Patient Have a Medical Advance Directive? Yes  Type of Advance Directive [place]  Does patient want to make changes to medical advance directive? No - Patient declined  Copy of [place] in Chart? Yes - validated most recent copy scanned in chart (See row information)  Would patient like information on creating a medical advance directive? -  Pre-existing out of facility DNR order (yellow form or pink MOST form) -     Chief Complaint  Patient presents with   Acute Visit    Patient is seen acutely for anxiety.    HPI:  Pt is a 86 y.o. female seen today for an acute visit for anxiety, Lorazepam prn used frequently.   Dementia, takes Memantine 40m bid.  Chronic edema BLE, R>L, takes Furosemide 253mqd.  Depression/anxiety, takes Depakote12537mid, Duloxetine 40m44m, Lorazepam 0.25mg27mrn, used almost daily, Quetiapine 25mg 32mypothyroidism, takes Levothyroxine 25mcg 66mHTN, takes Lisinopril 10mg qd24mA, takes Tylenol.    Past Medical History:  Diagnosis Date   Alzheimer disease (HCC) 03/[place]2018   Arthritis     Confusion 04/04/2019   Depression, recurrent (HCC) 03/[place]2018   History of fracture of right hip 04/04/2019   07/01/19 Ortho, R hip incision healed, X-ray looks good, s/p IMN R hip fx, WBAT, f/u 6 mos.    HTN (hypertension) 10/27/2016   Hypertension    Hypertensive kidney disease with CKD (chronic kidney disease) stage V (HCC) 03/[place]2018   11/02/16 Na 136, K 4.7, Bun 30, creat 1.52, TSH 0.86, wbc 6.7, Hgb 11.3, plt 264   Hypothyroidism 10/27/2016   Osteoarthritis 10/27/2016   Thyroid disease    Past Surgical History:  Procedure Laterality Date   ABDOMINAL HYSTERECTOMY  1986   INTRAMEDULLARY (IM) NAIL INTERTROCHANTERIC Right 04/04/2019   Procedure: INTRAMEDULLARY (IM) NAIL INTERTROCHANTRIC;  Surgeon: SwinteckRod Canocation: [facility];  Service: Orthopedics;  Laterality: Right;   KNEE ARTHROSCOPY  2015/2017   Dr. Loiby   Gustavus Bryant SURGERY  2009, 2011   Dr.Mark Li   TIBNicoletta Dress IM NAIL INSERTION Right 01/01/2020   Procedure: INTRAMEDULLARY (IM) NAIL TIBIAL;  Surgeon: Haddix, Shona Needlesocation: [facility] OR;  [place]: Orthopedics;  Laterality: Right;    No Known Allergies  Allergies as of 04/06/2020   No Known Allergies     Medication List       Accurate as of April 06, 2020 11:59 PM. If you have any questions, ask your nurse or doctor.        STOP taking these medications   feeding supplement (PRO-STAT SUGAR FREE 64) Liqd Stopped by: Quintell Bonnin X Arie Powell, NP     TAKE these medications   acetaminophen 325 MG  tablet Commonly known as: TYLENOL Take 650 mg by mouth 2 (two) times daily.   ascorbic acid 500 MG tablet Commonly known as: VITAMIN C Take 500 mg by mouth daily.   aspirin EC 81 MG tablet Take 81 mg by mouth daily.   CALCIUM 600 PO Take 1 tablet by mouth daily.   cholecalciferol 25 MCG (1000 UNIT) tablet Commonly known as: VITAMIN D3 Take 2,000 Units by mouth daily.   divalproex 125 MG DR tablet Commonly known as: DEPAKOTE Take 125 mg by mouth in the morning  and at bedtime.   docusate sodium 100 MG capsule Commonly known as: COLACE Take 1 capsule (100 mg total) by mouth 2 (two) times daily.   DULoxetine 30 MG capsule Commonly known as: CYMBALTA Take 30 mg by mouth daily.   furosemide 20 MG tablet Commonly known as: LASIX Take 20 mg by mouth daily.   levothyroxine 25 MCG tablet Commonly known as: SYNTHROID Take 25 mcg by mouth daily before breakfast.   lisinopril 10 MG tablet Commonly known as: ZESTRIL Take 10 mg by mouth daily.   LORazepam 0.5 MG tablet Commonly known as: ATIVAN Take 0.25 mg by mouth at bedtime as needed.   memantine 10 MG tablet Commonly known as: NAMENDA Take 10 mg by mouth 2 (two) times daily.   ondansetron 4 MG disintegrating tablet Commonly known as: ZOFRAN-ODT Take 4 mg by mouth every 8 (eight) hours as needed for nausea or vomiting.   potassium chloride SA 20 MEQ tablet Commonly known as: KLOR-CON Take 20 mEq by mouth 2 (two) times daily.   QUEtiapine 25 MG tablet Commonly known as: SEROQUEL Take 25 mg by mouth at bedtime.   traMADol 50 MG tablet Commonly known as: ULTRAM Take 0.5 tablets (25 mg total) by mouth every 6 (six) hours as needed.   vitamin B-12 1000 MCG tablet Commonly known as: CYANOCOBALAMIN Take 1,000 mcg by mouth. Once a day on Monday and Wednesday       Review of Systems  Constitutional: Negative for appetite change, fatigue and fever.  HENT: Positive for hearing loss. Negative for congestion and voice change.   Eyes: Negative for visual disturbance.  Respiratory: Negative for cough, shortness of breath and wheezing.   Cardiovascular: Positive for leg swelling.  Gastrointestinal: Negative for abdominal pain and diarrhea.  Genitourinary: Negative for dysuria, frequency and urgency.  Musculoskeletal: Positive for arthralgias and gait problem.       S/p R lower leg IM tibia  Skin: Negative for color change.  Neurological: Negative for dizziness, speech difficulty,  weakness and headaches.       Dementia, lethargic.   Psychiatric/Behavioral: Positive for agitation, behavioral problems, confusion and sleep disturbance. The patient is nervous/anxious.     Immunization History  Administered Date(s) Administered   Influenza-Unspecified 05/29/2017   Moderna SARS-COVID-2 Vaccination 09/07/2019, 10/05/2019   Pneumococcal Conjugate-13 06/21/2017   Pneumococcal Polysaccharide-23 07/16/2018   Tdap 06/21/2017   Pertinent  Health Maintenance Due  Topic Date Due   DEXA SCAN  Completed   PNA vac Low Risk Adult  Completed   INFLUENZA VACCINE  Discontinued   Fall Risk  04/27/2018 04/25/2017  Falls in the past year? No No   Functional Status Survey:    Vitals:   04/06/20 1635  BP: 132/80  Pulse: 88  Resp: 16  Temp: 98.4 F (36.9 C)  TempSrc: Oral  SpO2: 95%  Weight: 143 lb 6.4 oz (65 kg)  Height: 4' 11"  (1.499 m)   Body  mass index is 28.96 kg/m. Physical Exam Vitals and nursing note reviewed.  Constitutional:      Appearance: Normal appearance.  HENT:     Head: Normocephalic and atraumatic.     Mouth/Throat:     Mouth: Mucous membranes are moist.  Eyes:     Extraocular Movements: Extraocular movements intact.     Conjunctiva/sclera: Conjunctivae normal.     Pupils: Pupils are equal, round, and reactive to light.  Cardiovascular:     Rate and Rhythm: Normal rate and regular rhythm.     Heart sounds: No murmur heard.   Pulmonary:     Effort: Pulmonary effort is normal.     Breath sounds: No wheezing or rales.  Abdominal:     General: Bowel sounds are normal.     Palpations: Abdomen is soft.     Tenderness: There is no abdominal tenderness.  Musculoskeletal:        General: No tenderness.     Cervical back: Normal range of motion and neck supple.     Right lower leg: Edema present.     Left lower leg: No edema.     Comments: R s/p IM rod for tibia fx. Minimal swelling RLE  Skin:    General: Skin is warm and dry.    Neurological:     General: No focal deficit present.     Mental Status: She is alert.     Coordination: Coordination normal.     Gait: Gait abnormal.  Psychiatric:        Mood and Affect: Mood normal.     Comments: Smiled upon my examination, conversed appropriately.      Labs reviewed: Recent Labs    01/09/20 0425 01/09/20 0425 01/10/20 0430 01/10/20 0430 01/11/20 0306 01/11/20 0306 01/15/20 0000 01/21/20 0000 03/12/20 0000  NA 140   < > 140   < > 140  --  142  --  139  K 3.8   < > 3.8   < > 3.6  --  3.2*  --  4.6  CL 108   < > 107   < > 110  --  109*  --  105  CO2 23   < > 23   < > 21*  --   --  28* 30*  GLUCOSE 103*  --  90  --  88  --   --   --   --   BUN 27*   < > 19   < > 14  --  9 16 20   CREATININE 0.98   < > 0.85   < > 0.74  --  0.8 1.0 1.2*  CALCIUM 8.8*   < > 8.6*   < > 8.6*   < > 8.8 9.8 10.1   < > = values in this interval not displayed.   Recent Labs    01/08/20 1448 01/08/20 1448 01/09/20 0425 01/11/20 0306 03/12/20 0000  AST 14*   < > 11* 11* 9*  ALT 10   < > 9 9 6*  ALKPHOS 58   < > 49 62 107  BILITOT 0.7  --  0.6 0.8  --   PROT 5.9*  --  5.4* 5.4*  --   ALBUMIN 2.6*   < > 2.5* 2.5* 3.5   < > = values in this interval not displayed.   Recent Labs    01/03/20 0615 01/07/20 0000 01/08/20 1448 01/08/20 2035 01/09/20 0425 01/09/20 0425 01/10/20 0430 01/10/20 0430  01/11/20 0306 01/21/20 0000 03/12/20 0000  WBC   < > 8.3 15.8*   < > 12.6*   < > 15.1*   < > 12.1* 9.0 7.2  NEUTROABS  --  4,856 12.2*  --   --   --   --   --   --   --  3,902  HGB   < > 9.0* 8.4*   < > 6.9*   < > 9.9*   < > 10.8* 11.5* 11.0*  HCT   < > 27* 27.4*   < > 22.8*   < > 29.8*   < > 34.0* 35* 34*  MCV   < >  --  97.5   < > 98.7  --  87.9  --  91.2  --   --   PLT   < > 318 260   < > 207   < > 217   < > 266 389 234   < > = values in this interval not displayed.   Lab Results  Component Value Date   TSH 1.58 03/12/2020   Lab Results  Component Value Date   HGBA1C  5.3 11/01/2016   No results found for: CHOL, HDL, LDLCALC, LDLDIRECT, TRIG, CHOLHDL  Significant Diagnostic Results in last 30 days:  No results found.  Assessment/Plan: Anxiety and depression Depression, anxiety, will increase Depakote 283m bid, update CBC/diff, CMP/eGFR. Continue Duloxetine 341mqd, Lorazepam 0.2548m prn, used almost daily, Quetiapine 53m89m   Edema Not apparent, continue Furosemide.   Essential hypertension Blood pressure is controlled, continue Lisinopril.   Mixed Alzheimer's and vascular dementia (HCC)[place], sundowning symptoms, continue Memantine,  Hypothyroidism Continue Levothyroxine.     Family/ staff Communication: plan of care reviewed with the patient and charge nurse.   Labs/tests ordered:  CBC/diff, CMP/eGFR  Time spend 35 minutes

## 2020-04-07 ENCOUNTER — Non-Acute Institutional Stay (SKILLED_NURSING_FACILITY): Payer: Medicare Other | Admitting: Internal Medicine

## 2020-04-07 ENCOUNTER — Encounter: Payer: Self-pay | Admitting: Nurse Practitioner

## 2020-04-07 ENCOUNTER — Encounter: Payer: Self-pay | Admitting: Internal Medicine

## 2020-04-07 DIAGNOSIS — G309 Alzheimer's disease, unspecified: Secondary | ICD-10-CM | POA: Diagnosis not present

## 2020-04-07 DIAGNOSIS — F329 Major depressive disorder, single episode, unspecified: Secondary | ICD-10-CM | POA: Diagnosis not present

## 2020-04-07 DIAGNOSIS — F0150 Vascular dementia without behavioral disturbance: Secondary | ICD-10-CM

## 2020-04-07 DIAGNOSIS — E039 Hypothyroidism, unspecified: Secondary | ICD-10-CM

## 2020-04-07 DIAGNOSIS — I10 Essential (primary) hypertension: Secondary | ICD-10-CM | POA: Diagnosis not present

## 2020-04-07 DIAGNOSIS — R269 Unspecified abnormalities of gait and mobility: Secondary | ICD-10-CM | POA: Diagnosis not present

## 2020-04-07 DIAGNOSIS — F32A Depression, unspecified: Secondary | ICD-10-CM

## 2020-04-07 DIAGNOSIS — F419 Anxiety disorder, unspecified: Secondary | ICD-10-CM

## 2020-04-07 DIAGNOSIS — F0280 Dementia in other diseases classified elsewhere without behavioral disturbance: Secondary | ICD-10-CM | POA: Diagnosis not present

## 2020-04-07 DIAGNOSIS — R609 Edema, unspecified: Secondary | ICD-10-CM | POA: Diagnosis not present

## 2020-04-07 DIAGNOSIS — R296 Repeated falls: Secondary | ICD-10-CM

## 2020-04-07 NOTE — Assessment & Plan Note (Signed)
Not apparent, continue Furosemide.

## 2020-04-07 NOTE — Progress Notes (Signed)
Location:   [facility] Room Number: 42 Place of Service:  SNF [phone]) Provider:  Veleta Miners MD  [name], MD  Patient Care Team: [name], MD as PCP - General (Internal Medicine) Mast, Man X, NP as Nurse Practitioner (Internal Medicine) [name], MD (Internal Medicine)  Extended Emergency Contact Information Primary Emergency Contact: [facility] Address: [address]          [place], [place] [address] [name] of [place] Phone: [phone] Relation: Son Secondary Emergency Contact: [facility] Address: [address] [place] [address] [name] Phone: [phone] Relation: Daughter  Code Status:  DNR Goals of care: Advanced Directive information Advanced Directives 04/06/2020  Does Patient Have a Medical Advance Directive? Yes  Type of Advance Directive [place]  Does patient want to make changes to medical advance directive? No - Patient declined  Copy of [employer] in Chart? Yes - validated most recent copy scanned in chart (See row information)  Would patient like information on creating a medical advance directive? -  Pre-existing out of facility DNR order (yellow form or pink MOST form) -     Chief Complaint  Patient presents with  . Acute Visit    Behavior    HPI:  Pt is a 86 y.o. female seen today for an acute visit for Behavior Issues  Patientis long termcare resident Was admitted in the hospital from 6/2to6/6 for sepsis secondary to colitis. Wasalsoadmitted in the Hospital from 5/25-5/28 for RightFibula Tibia Fractures s/p IM rod Placement Patientalsohas h/o Hypertension, Hypothyroidism, Alzheimer's Dementia with behavior issues, Depression, B12 def, S/P Right Hip fracture with Intramedullary Fixation in 8/27  Continues to be Fall risk and Recurent Falls. Now having behavior issues Trying to leave the Facility. Trying  to walk with no assist. Spitting the Meds. She was up all night and was sleeping this morning. No Fever or chills. No Cough Patient did not have any comlains  Past Medical History:  Diagnosis Date  . Alzheimer disease ([place]) 10/27/2016  . Arthritis   . Confusion 04/04/2019  . Depression, recurrent ([place]) 10/27/2016  . History of fracture of right hip 04/04/2019   07/01/19 Ortho, R hip incision healed, X-ray looks good, s/p IMN R hip fx, WBAT, f/u 6 mos.   Marland Kitchen HTN (hypertension) 10/27/2016  . Hypertension   . Hypertensive kidney disease with CKD (chronic kidney disease) stage V ([place]) 11/02/2016   11/02/16 Na 136, K 4.7, Bun 30, creat 1.52, TSH 0.86, wbc 6.7, Hgb 11.3, plt 264  . Hypothyroidism 10/27/2016  . Osteoarthritis 10/27/2016  . Thyroid disease    Past Surgical History:  Procedure Laterality Date  . ABDOMINAL HYSTERECTOMY  1986  . INTRAMEDULLARY (IM) NAIL INTERTROCHANTERIC Right 04/04/2019   Procedure: INTRAMEDULLARY (IM) NAIL INTERTROCHANTRIC;  Surgeon: Rod Can, MD;  [facility]: [facility];  Service: Orthopedics;  Laterality: Right;  . KNEE ARTHROSCOPY  2015/2017   Dr. Gustavus Bryant  . SPINE SURGERY  2009, 2011   Dr.Mark Nicoletta Dress  . TIBIA IM NAIL INSERTION Right 01/01/2020   Procedure: INTRAMEDULLARY (IM) NAIL TIBIAL;  Surgeon: Shona Needles, MD;  Location: [facility];  Service: Orthopedics;  Laterality: Right;    No Known Allergies  Allergies as of 04/07/2020   No Known Allergies     Medication List       Accurate as of April 07, 2020 11:42 AM. If you  have any questions, ask your nurse or doctor.        acetaminophen 325 MG tablet Commonly known as: TYLENOL Take 650 mg by mouth 2 (two) times daily.   ascorbic acid 500 MG tablet Commonly known as: VITAMIN C Take 500 mg by mouth daily.   aspirin EC 81 MG tablet Take 81 mg by mouth daily.   CALCIUM 600 PO Take 1 tablet by mouth daily.   cholecalciferol 25 MCG (1000 UNIT) tablet Commonly known as: VITAMIN D3 Take 2,000  Units by mouth daily.   divalproex 125 MG DR tablet Commonly known as: DEPAKOTE Take 125 mg by mouth in the morning and at bedtime.   docusate sodium 100 MG capsule Commonly known as: COLACE Take 1 capsule (100 mg total) by mouth 2 (two) times daily.   DULoxetine 30 MG capsule Commonly known as: CYMBALTA Take 30 mg by mouth daily.   furosemide 20 MG tablet Commonly known as: LASIX Take 20 mg by mouth daily.   levothyroxine 25 MCG tablet Commonly known as: SYNTHROID Take 25 mcg by mouth daily before breakfast.   lisinopril 10 MG tablet Commonly known as: ZESTRIL Take 10 mg by mouth daily.   LORazepam 0.5 MG tablet Commonly known as: ATIVAN Take 0.25 mg by mouth at bedtime as needed.   memantine 10 MG tablet Commonly known as: NAMENDA Take 10 mg by mouth 2 (two) times daily.   ondansetron 4 MG disintegrating tablet Commonly known as: ZOFRAN-ODT Take 4 mg by mouth every 8 (eight) hours as needed for nausea or vomiting.   potassium chloride SA 20 MEQ tablet Commonly known as: KLOR-CON Take 20 mEq by mouth 2 (two) times daily.   QUEtiapine 25 MG tablet Commonly known as: SEROQUEL Take 25 mg by mouth at bedtime.   traMADol 50 MG tablet Commonly known as: ULTRAM Take 0.5 tablets (25 mg total) by mouth every 6 (six) hours as needed.   vitamin B-12 1000 MCG tablet Commonly known as: CYANOCOBALAMIN Take 1,000 mcg by mouth. Once a day on Monday and Wednesday       Review of Systems  Unable to perform ROS: Dementia    Immunization History  Administered Date(s) Administered  . Influenza-Unspecified 05/29/2017  . Moderna SARS-COVID-2 Vaccination 09/07/2019, 10/05/2019  . Pneumococcal Conjugate-13 06/21/2017  . Pneumococcal Polysaccharide-23 07/16/2018  . Tdap 06/21/2017   Pertinent  Health Maintenance Due  Topic Date Due  . DEXA SCAN  Completed  . PNA vac Low Risk Adult  Completed  . INFLUENZA VACCINE  Discontinued   Fall Risk  04/27/2018 04/25/2017  Falls  in the past year? No No   Functional Status Survey:    Vitals:   04/07/20 1138  BP: 132/80  Pulse: 88  Resp: 16  Temp: 98 F (36.7 C)  SpO2: 95%  Weight: 143 lb 6.4 oz (65 kg)  Height: 4\' 11"  (1.499 m)   Body mass index is 28.96 kg/m. Physical Exam Vitals reviewed.  Constitutional:      Appearance: Normal appearance.  HENT:     Head: Normocephalic.     Nose: Nose normal.     Mouth/Throat:     Mouth: Mucous membranes are moist.     Pharynx: Oropharynx is clear.  Eyes:     Pupils: Pupils are equal, round, and reactive to light.  Cardiovascular:     Rate and Rhythm: Normal rate and regular rhythm.     Pulses: Normal pulses.  Pulmonary:     Effort: Pulmonary effort  is normal.     Breath sounds: Normal breath sounds.  Abdominal:     General: Abdomen is flat. Bowel sounds are normal.     Palpations: Abdomen is soft.  Musculoskeletal:     Cervical back: Neck supple.     Comments: Mild Swelling Bilateral Right more then Left  Skin:    General: Skin is warm and dry.  Neurological:     General: No focal deficit present.     Mental Status: She is alert.     Comments: Oriented to Self  Psychiatric:        Mood and Affect: Mood normal.     Labs reviewed: Recent Labs    01/09/20 0425 01/09/20 0425 01/10/20 0430 01/10/20 0430 01/11/20 0306 01/11/20 0306 01/15/20 0000 01/21/20 0000 03/12/20 0000  NA 140   < > 140   < > 140  --  142  --  139  K 3.8   < > 3.8   < > 3.6  --  3.2*  --  4.6  CL 108   < > 107   < > 110  --  109*  --  105  CO2 23   < > 23   < > 21*  --   --  28* 30*  GLUCOSE 103*  --  90  --  88  --   --   --   --   BUN 27*   < > 19   < > 14  --  9 16 20   CREATININE 0.98   < > 0.85   < > 0.74  --  0.8 1.0 1.2*  CALCIUM 8.8*   < > 8.6*   < > 8.6*   < > 8.8 9.8 10.1   < > = values in this interval not displayed.   Recent Labs    01/08/20 1448 01/08/20 1448 01/09/20 0425 01/11/20 0306 03/12/20 0000  AST 14*   < > 11* 11* 9*  ALT 10   < > 9 9  6*  ALKPHOS 58   < > 49 62 107  BILITOT 0.7  --  0.6 0.8  --   PROT 5.9*  --  5.4* 5.4*  --   ALBUMIN 2.6*   < > 2.5* 2.5* 3.5   < > = values in this interval not displayed.   Recent Labs    01/03/20 0615 01/07/20 0000 01/08/20 1448 01/08/20 2035 01/09/20 0425 01/09/20 0425 01/10/20 0430 01/10/20 0430 01/11/20 0306 01/21/20 0000 03/12/20 0000  WBC   < > 8.3 15.8*   < > 12.6*   < > 15.1*   < > 12.1* 9.0 7.2  NEUTROABS  --  4,856 12.2*  --   --   --   --   --   --   --  3,902  HGB   < > 9.0* 8.4*   < > 6.9*   < > 9.9*   < > 10.8* 11.5* 11.0*  HCT   < > 27* 27.4*   < > 22.8*   < > 29.8*   < > 34.0* 35* 34*  MCV   < >  --  97.5   < > 98.7  --  87.9  --  91.2  --   --   PLT   < > 318 260   < > 207   < > 217   < > 266 389 234   < > = values in this interval  not displayed.   Lab Results  Component Value Date   TSH 1.58 03/12/2020   Lab Results  Component Value Date   HGBA1C 5.3 11/01/2016   No results found for: CHOL, HDL, LDLCALC, LDLDIRECT, TRIG, CHOLHDL  Significant Diagnostic Results in last 30 days:  No results found.  Assessment/Plan Mixed Alzheimer's and vascular dementia with Behavior issues Labs done few weeks ago were in Good range Depakote just increased to 250 mg Already on Namenda Will start her on Klonopin and see if it helps Also will try Seroquel PRN Unfortunately stays very high risk for falls  Anxiety and depression On Cymbalta Try Klonopin to see if it helps Edema, unspecified type On low dose of Lasix Creat stable Essential hypertension BP stable on Lisinopril Hypothyroidism, unspecified type TSH normal in 8/21 Recurrent falls Stays High Risk Trying to avoid high doses of Ativan S/P IM Nailing for Tibial Fracture Pain Controlled on tramadol WBAT Continue to wear RLE Brace ACP . She is Comfort care   Family/ staff Communication:   Labs/tests ordered:

## 2020-04-07 NOTE — Assessment & Plan Note (Signed)
Advanced, sundowning symptoms, continue Memantine,

## 2020-04-07 NOTE — Assessment & Plan Note (Signed)
Continue Levothyroxine 

## 2020-04-07 NOTE — Assessment & Plan Note (Signed)
Blood pressure is controlled, continue Lisinopril.  

## 2020-04-09 DIAGNOSIS — D649 Anemia, unspecified: Secondary | ICD-10-CM | POA: Diagnosis not present

## 2020-04-09 DIAGNOSIS — I10 Essential (primary) hypertension: Secondary | ICD-10-CM | POA: Diagnosis not present

## 2020-04-10 LAB — COMPREHENSIVE METABOLIC PANEL
Albumin: 3.8 (ref 3.5–5.0)
Calcium: 10.4 (ref 8.7–10.7)
Globulin: 3

## 2020-04-10 LAB — CBC: RBC: 3.72 — AB (ref 3.87–5.11)

## 2020-04-10 LAB — BASIC METABOLIC PANEL
BUN: 24 — AB (ref 4–21)
CO2: 26 — AB (ref 13–22)
Chloride: 105 (ref 99–108)
Creatinine: 1 (ref 0.5–1.1)
Glucose: 91
Potassium: 4.2 (ref 3.4–5.3)
Sodium: 141 (ref 137–147)

## 2020-04-10 LAB — CBC AND DIFFERENTIAL
HCT: 34 — AB (ref 36–46)
Hemoglobin: 11.2 — AB (ref 12.0–16.0)
Neutrophils Absolute: 4487
Platelets: 238 (ref 150–399)
WBC: 7.9

## 2020-04-10 LAB — HEPATIC FUNCTION PANEL
ALT: 7 (ref 7–35)
AST: 10 — AB (ref 13–35)
Alkaline Phosphatase: 98 (ref 25–125)
Bilirubin, Total: 0.3

## 2020-04-15 ENCOUNTER — Non-Acute Institutional Stay (SKILLED_NURSING_FACILITY): Payer: Medicare Other | Admitting: Nurse Practitioner

## 2020-04-15 ENCOUNTER — Encounter: Payer: Self-pay | Admitting: Nurse Practitioner

## 2020-04-15 DIAGNOSIS — F419 Anxiety disorder, unspecified: Secondary | ICD-10-CM | POA: Diagnosis not present

## 2020-04-15 DIAGNOSIS — G309 Alzheimer's disease, unspecified: Secondary | ICD-10-CM | POA: Diagnosis not present

## 2020-04-15 DIAGNOSIS — M159 Polyosteoarthritis, unspecified: Secondary | ICD-10-CM | POA: Diagnosis not present

## 2020-04-15 DIAGNOSIS — F0280 Dementia in other diseases classified elsewhere without behavioral disturbance: Secondary | ICD-10-CM

## 2020-04-15 DIAGNOSIS — F329 Major depressive disorder, single episode, unspecified: Secondary | ICD-10-CM

## 2020-04-15 DIAGNOSIS — R296 Repeated falls: Secondary | ICD-10-CM

## 2020-04-15 DIAGNOSIS — F32A Depression, unspecified: Secondary | ICD-10-CM

## 2020-04-15 DIAGNOSIS — R609 Edema, unspecified: Secondary | ICD-10-CM | POA: Diagnosis not present

## 2020-04-15 DIAGNOSIS — I10 Essential (primary) hypertension: Secondary | ICD-10-CM | POA: Diagnosis not present

## 2020-04-15 DIAGNOSIS — F0150 Vascular dementia without behavioral disturbance: Secondary | ICD-10-CM

## 2020-04-15 DIAGNOSIS — E039 Hypothyroidism, unspecified: Secondary | ICD-10-CM | POA: Diagnosis not present

## 2020-04-15 NOTE — Assessment & Plan Note (Signed)
Chronic edema BLE, R>L, takes Furosemide 20mg  qd.

## 2020-04-15 NOTE — Assessment & Plan Note (Addendum)
Depression/anxiety, not sleeping well at night, continue Depakote, Duloxetine 30mg  qd, started Clonazepam qpm since 04/07/20, prn Quetiapine 12.5mg  qd, Lorazepam 0.25mg  hs prn available to her x 14 days since yesterday 04/14/20

## 2020-04-15 NOTE — Assessment & Plan Note (Signed)
Lack of safety awareness is contributory to her frequent falls, continue supportive care in SN[facility], continue Memantine, her goal of care is comfort measures.

## 2020-04-15 NOTE — Progress Notes (Signed)
Location:   SNF [facility] Room Number: 73 Place of Service:  SNF (31) Provider: [facility] Mattilyn Crites NP  [name], MD  Patient Care Team: [name], MD as PCP - General (Internal Medicine) Daysi Boggan X, NP as Nurse Practitioner (Internal Medicine) [name], MD (Internal Medicine)  Extended Emergency Contact Information Primary Emergency Contact: [facility] Address: [address]          [place], [place] [address] [name] of [place] Phone: [phone] Relation: Son Secondary Emergency Contact: [facility] Address: [address] [place] [place] [address] [name] of [place] Phone: [phone] Relation: Daughter  Code Status: DNR Goals of care: Advanced Directive information Advanced Directives 04/15/2020  Does Patient Have a Medical Advance Directive? Yes  Type of Advance Directive Living will;Out of facility DNR (pink MOST or yellow form)  Does patient want to make changes to medical advance directive? No - Patient declined  Copy of [place] in Chart? -  Would patient like information on creating a medical advance directive? -  Pre-existing out of facility DNR order (yellow form or pink MOST form) Yellow form placed in chart (order not valid for inpatient use)     Chief Complaint  Patient presents with  . Acute Visit    Fall    HPI:  Pt is a 86 y.o. [place] seen today for an acute visit for fall reported 04/15/20 when the patient was found sitting on the floor facing recliner, the patient has been getting up and down out of recliner and w/c unassisted during night.   Depression/anxiety, takes Depakote, Duloxetine 30mg  qd, started Clonazepam qpm since 04/07/20, takes  Quetiapine 12.5mg  prn, Lorazepam 0.25mg  hs prn.  Hypothyroidism, takes Levothyroxine 32mcg qd  HTN, takes Lisinopril 10mg  qd.            OA, takes Tylenol. s/p M nailing R tibial/fibular fxs.   Dementia, takes Memantine  10mg  bid. Comfort care. Chronic edema BLE, R>L, takes Furosemide 20mg  qd.    Past Medical History:  Diagnosis Date  . Alzheimer disease ([place]) 10/27/2016  . Arthritis   . Confusion 04/04/2019  . Depression, recurrent ([place]) 10/27/2016  . History of fracture of right hip 04/04/2019   07/01/19 Ortho, R hip incision healed, X-ray looks good, s/p IMN R hip fx, WBAT, f/u 6 mos.   Marland Kitchen HTN (hypertension) 10/27/2016  . Hypertension   . Hypertensive kidney disease with CKD (chronic kidney disease) stage V ([place]) 11/02/2016   11/02/16 Na 136, K 4.7, Bun 30, creat 1.52, TSH 0.86, wbc 6.7, Hgb 11.3, plt 264  . Hypothyroidism 10/27/2016  . Osteoarthritis 10/27/2016  . Thyroid disease    Past Surgical History:  Procedure Laterality Date  . ABDOMINAL HYSTERECTOMY  1986  . INTRAMEDULLARY (IM) NAIL INTERTROCHANTERIC Right 04/04/2019   Procedure: INTRAMEDULLARY (IM) NAIL INTERTROCHANTRIC;  Surgeon: Rod Can, MD;  Location: [facility];  Service: Orthopedics;  Laterality: Right;  . KNEE ARTHROSCOPY  2015/2017   Dr. Gustavus Bryant  . SPINE SURGERY  2009, 2011   Dr.Mark Nicoletta Dress  . TIBIA IM NAIL INSERTION Right 01/01/2020   Procedure: INTRAMEDULLARY (IM) NAIL TIBIAL;  Surgeon: Shona Needles, MD;  Location: [facility];  Service: Orthopedics;  Laterality: Right;    No Known Allergies  Allergies as of 04/15/2020   No Known Allergies     Medication List       Accurate as of April 15, 2020 11:59 PM.  If you have any questions, ask your nurse or doctor.        acetaminophen 325 MG tablet Commonly known as: TYLENOL Take 650 mg by mouth 2 (two) times daily.   ascorbic acid 500 MG tablet Commonly known as: VITAMIN C Take 500 mg by mouth daily.   aspirin EC 81 MG tablet Take 81 mg by mouth daily.   CALCIUM 600 PO Take 1 tablet by mouth daily.   cholecalciferol 25 MCG (1000 UNIT) tablet Commonly known as: VITAMIN D3 Take 2,000 Units by mouth daily.   clonazePAM 0.5 MG tablet Commonly known as:  KLONOPIN Take 0.5 mg by mouth daily.   divalproex 125 MG DR tablet Commonly known as: DEPAKOTE Take 250 mg by mouth in the morning and at bedtime.   docusate sodium 100 MG capsule Commonly known as: COLACE Take 1 capsule (100 mg total) by mouth 2 (two) times daily.   DULoxetine 30 MG capsule Commonly known as: CYMBALTA Take 30 mg by mouth daily.   furosemide 20 MG tablet Commonly known as: LASIX Take 20 mg by mouth daily.   levothyroxine 25 MCG tablet Commonly known as: SYNTHROID Take 25 mcg by mouth daily before breakfast.   lisinopril 10 MG tablet Commonly known as: ZESTRIL Take 10 mg by mouth daily.   LORazepam 0.5 MG tablet Commonly known as: ATIVAN Take 0.25 mg by mouth at bedtime as needed.   memantine 10 MG tablet Commonly known as: NAMENDA Take 10 mg by mouth 2 (two) times daily.   ondansetron 4 MG disintegrating tablet Commonly known as: ZOFRAN-ODT Take 4 mg by mouth every 8 (eight) hours as needed for nausea or vomiting.   potassium chloride SA 20 MEQ tablet Commonly known as: KLOR-CON Take 20 mEq by mouth 2 (two) times daily.   QUEtiapine 25 MG tablet Commonly known as: SEROQUEL Take 25 mg by mouth at bedtime.   traMADol 50 MG tablet Commonly known as: ULTRAM Take 0.5 tablets (25 mg total) by mouth every 6 (six) hours as needed.   vitamin B-12 1000 MCG tablet Commonly known as: CYANOCOBALAMIN Take 1,000 mcg by mouth. Once a day on Monday and Wednesday       Review of Systems  Constitutional: Negative for activity change, appetite change and fever.  HENT: Positive for hearing loss. Negative for congestion and voice change.   Eyes: Negative for visual disturbance.  Respiratory: Negative for cough, shortness of breath and wheezing.   Cardiovascular: Positive for leg swelling.  Gastrointestinal: Negative for abdominal pain and diarrhea.  Genitourinary: Negative for dysuria, frequency and urgency.  Musculoskeletal: Positive for arthralgias and  gait problem.       S/p R lower leg IM tibia  Skin: Negative for color change.  Neurological: Negative for speech difficulty, weakness, light-headedness and headaches.       Dementia, lethargic.   Psychiatric/Behavioral: Positive for agitation, behavioral problems, confusion and sleep disturbance. The patient is nervous/anxious.     Immunization History  Administered Date(s) Administered  . Influenza-Unspecified 05/29/2017  . Moderna SARS-COVID-2 Vaccination 09/07/2019, 10/05/2019  . Pneumococcal Conjugate-13 06/21/2017  . Pneumococcal Polysaccharide-23 07/16/2018  . Tdap 06/21/2017   Pertinent  Health Maintenance Due  Topic Date Due  . DEXA SCAN  Completed  . PNA vac Low Risk Adult  Completed  . INFLUENZA VACCINE  Discontinued   Fall Risk  04/27/2018 04/25/2017  Falls in the past year? No No   Functional Status Survey:    Vitals:   04/15/20 1313  BP: (!) 160/88  Pulse: 62  Resp: 18  Temp: (!) 97.4 F (36.3 C)  SpO2: 95%  Weight: 149 lb 12.8 oz (67.9 kg)  Height: 4\' 11"  (1.499 m)   Body mass index is 30.26 kg/m. Physical Exam Vitals and nursing note reviewed.  Constitutional:      Appearance: Normal appearance.  HENT:     Head: Normocephalic and atraumatic.     Mouth/Throat:     Mouth: Mucous membranes are moist.  Eyes:     Extraocular Movements: Extraocular movements intact.     Conjunctiva/sclera: Conjunctivae normal.     Pupils: Pupils are equal, round, and reactive to light.  Cardiovascular:     Rate and Rhythm: Normal rate and regular rhythm.     Heart sounds: No murmur heard.   Pulmonary:     Effort: Pulmonary effort is normal.     Breath sounds: No wheezing or rales.  Abdominal:     General: Bowel sounds are normal.     Palpations: Abdomen is soft.     Tenderness: There is no abdominal tenderness.  Musculoskeletal:     Cervical back: Normal range of motion and neck supple.     Right lower leg: Edema present.     Left lower leg: Edema present.      Comments: R s/p IM rod for tibia fx. Trace to 1+ edema BLE  Skin:    General: Skin is warm and dry.  Neurological:     General: No focal deficit present.     Mental Status: She is alert.     Coordination: Coordination normal.     Gait: Gait abnormal.  Psychiatric:        Mood and Affect: Mood normal.     Comments: Smiled upon my examination, conversed appropriately.      Labs reviewed: Recent Labs    01/09/20 0425 01/09/20 0425 01/10/20 0430 01/10/20 0430 01/11/20 0306 01/11/20 0306 01/15/20 0000 01/21/20 0000 03/12/20 0000  NA 140   < > 140   < > 140  --  142  --  139  K 3.8   < > 3.8   < > 3.6  --  3.2*  --  4.6  CL 108   < > 107   < > 110  --  [place]*  --  105  CO2 23   < > 23   < > 21*  --   --  28* 30*  GLUCOSE 103*  --  90  --  88  --   --   --   --   BUN 27*   < > 19   < > 14  --  9 16 20   CREATININE 0.98   < > 0.85   < > 0.74  --  0.8 1.0 1.2*  CALCIUM 8.8*   < > 8.6*   < > 8.6*   < > 8.8 9.8 10.1   < > = values in this interval not displayed.   Recent Labs    01/08/20 1448 01/08/20 1448 01/09/20 0425 01/11/20 0306 03/12/20 0000  AST 14*   < > 11* 11* 9*  ALT 10   < > 9 9 6*  ALKPHOS 58   < > 49 62 107  BILITOT 0.7  --  0.6 0.8  --   PROT 5.9*  --  5.4* 5.4*  --   ALBUMIN 2.6*   < > 2.5* 2.5* 3.5   < > = values in  this interval not displayed.   Recent Labs    01/03/20 0615 01/07/20 0000 01/08/20 1448 01/08/20 2035 01/09/20 0425 01/09/20 0425 01/10/20 0430 01/10/20 0430 01/11/20 0306 01/21/20 0000 03/12/20 0000  WBC   < > 8.3 15.8*   < > 12.6*   < > 15.1*   < > 12.1* 9.0 7.2  NEUTROABS  --  4,856 12.2*  --   --   --   --   --   --   --  3,902  HGB   < > 9.0* 8.4*   < > 6.9*   < > 9.9*   < > 10.8* 11.5* 11.0*  HCT   < > 27* 27.4*   < > 22.8*   < > 29.8*   < > 34.0* 35* 34*  MCV   < >  --  97.5   < > 98.7  --  87.9  --  91.2  --   --   PLT   < > 318 260   < > 207   < > 217   < > 266 389 234   < > = values in this interval not displayed.    Lab Results  Component Value Date   TSH 1.58 03/12/2020   Lab Results  Component Value Date   HGBA1C 5.3 11/01/2016   No results found for: CHOL, HDL, LDLCALC, LDLDIRECT, TRIG, CHOLHDL  Significant Diagnostic Results in last 30 days:  No results found.  Assessment/Plan: Recurrent falls fall reported 04/15/20 when the patient was found sitting on the floor facing recliner, the patient has been getting up and down out of recliner and w/c unassisted during night. Already taking Duloxetine, Clonazepam, Depakote, Quetiapine for mood/behaviors. Close supervision/assistance for safety for now.   Anxiety and depression Depression/anxiety, not sleeping well at night, continue Depakote, Duloxetine 30mg  qd, started Clonazepam qpm since 04/07/20, prn Quetiapine 12.5mg  qd, Lorazepam 0.25mg  hs prn available to her x 14 days since yesterday 04/14/20  Hypothyroidism TSH wnl 03/27/20, continue Levothyroxine.   Essential hypertension Sbp 158mmHg today 04/15/20,  no noted headache, change of vision, chest pain/pressure, palpitation, or generalized malaise, will continue to monitor Bp, continue Lisinopril 10mg  qd.  Bp 146/64 04/16/20  Mixed Alzheimer's and vascular dementia ([place]) Lack of safety awareness is contributory to her frequent falls, continue supportive care in SN[facility], continue Memantine, her goal of care is comfort measures.   Osteoarthritis OA, takes Tylenol. s/p M nailing R tibial/fibular fxs.   Edema Chronic edema BLE, R>L, takes Furosemide 20mg  qd.       Family/ staff Communication: plan of care reviewed with the patient and charge nurse.   Labs/tests ordered: none  Time spend 25 minutes.

## 2020-04-15 NOTE — Assessment & Plan Note (Signed)
fall reported 04/15/20 when the patient was found sitting on the floor facing recliner, the patient has been getting up and down out of recliner and w/c unassisted during night. Already taking Duloxetine, Clonazepam, Depakote, Quetiapine for mood/behaviors. Close supervision/assistance for safety for now.

## 2020-04-15 NOTE — Assessment & Plan Note (Signed)
TSH wnl 03/2020, continue Levothyroxine.  

## 2020-04-15 NOTE — Assessment & Plan Note (Addendum)
Sbp 158mmHg today 04/15/20,  no noted headache, change of vision, chest pain/pressure, palpitation, or generalized malaise, will continue to monitor Bp, continue Lisinopril 10mg  qd.  Bp 146/64 04/16/20

## 2020-04-15 NOTE — Assessment & Plan Note (Signed)
OA, takes Tylenol. s/p M nailing R tibial/fibular fxs.

## 2020-04-16 ENCOUNTER — Encounter: Payer: Self-pay | Admitting: Nurse Practitioner

## 2020-04-20 ENCOUNTER — Non-Acute Institutional Stay (SKILLED_NURSING_FACILITY): Payer: Medicare Other | Admitting: Internal Medicine

## 2020-04-20 ENCOUNTER — Encounter: Payer: Self-pay | Admitting: Internal Medicine

## 2020-04-20 DIAGNOSIS — F0150 Vascular dementia without behavioral disturbance: Secondary | ICD-10-CM

## 2020-04-20 DIAGNOSIS — F0280 Dementia in other diseases classified elsewhere without behavioral disturbance: Secondary | ICD-10-CM | POA: Diagnosis not present

## 2020-04-20 DIAGNOSIS — I10 Essential (primary) hypertension: Secondary | ICD-10-CM

## 2020-04-20 DIAGNOSIS — G309 Alzheimer's disease, unspecified: Secondary | ICD-10-CM

## 2020-04-20 NOTE — Progress Notes (Signed)
Location:   [facility] Room Number: 42 Place of Service:  SNF [phone]) Provider:  Veleta Miners MD  [name], MD  Patient Care Team: [name], MD as PCP - General (Internal Medicine) Mast, Man X, NP as Nurse Practitioner (Internal Medicine) [name], MD (Internal Medicine)  Extended Emergency Contact Information Primary Emergency Contact: [facility] Address: [address]          [place], [place] [address] [name] of [place] Phone: [phone] Relation: Son Secondary Emergency Contact: [facility] Address: [address] [place] [address] [name] Phone: [phone] Relation: Daughter  Code Status:  DNR Goals of care: Advanced Directive information Advanced Directives 04/15/2020  Does Patient Have a Medical Advance Directive? Yes  Type of Advance Directive Living will;Out of facility DNR (pink MOST or yellow form)  Does patient want to make changes to medical advance directive? No - Patient declined  Copy of [place] in Chart? -  Would patient like information on creating a medical advance directive? -  Pre-existing out of facility DNR order (yellow form or pink MOST form) Yellow form placed in chart (order not valid for inpatient use)     Chief Complaint  Patient presents with  . Acute Visit    Behaviors    HPI:  Pt is a 86 y.o. female seen today for an acute visit for Follow up of her [place]  [place] termcare resident Was admitted in the hospital from 6/2to6/6 for sepsis secondary to colitis. Wasalsoadmitted in the Hospital from 5/25-5/28 for RightFibula Tibia Fractures s/p IM rod Placement Patientalsohas h/o Hypertension, Hypothyroidism, Alzheimer's Dementia with behavior issues, Depression, B12 def, S/P Right Hip fracture with Intramedullary Fixation in 8/27  Continues to fall and have behavior issues mostly in the  evenings Trying to leave. Trying to walk with no assist Right now sitting calmly and says that she gets Anxious later  Past Medical History:  Diagnosis Date  . Alzheimer disease ([place]) 10/27/2016  . Arthritis   . Confusion 04/04/2019  . Depression, recurrent ([place]) 10/27/2016  . History of fracture of right hip 04/04/2019   07/01/19 Ortho, R hip incision healed, X-ray looks good, s/p IMN R hip fx, WBAT, f/u 6 mos.   Marland Kitchen HTN (hypertension) 10/27/2016  . Hypertension   . Hypertensive kidney disease with CKD (chronic kidney disease) stage V ([place]) 11/02/2016   11/02/16 Na 136, K 4.7, Bun 30, creat 1.52, TSH 0.86, wbc 6.7, Hgb 11.3, plt 264  . Hypothyroidism 10/27/2016  . Osteoarthritis 10/27/2016  . Thyroid disease    Past Surgical History:  Procedure Laterality Date  . ABDOMINAL HYSTERECTOMY  1986  . INTRAMEDULLARY (IM) NAIL INTERTROCHANTERIC Right 04/04/2019   Procedure: INTRAMEDULLARY (IM) NAIL INTERTROCHANTRIC;  Surgeon: Rod Can, MD;  [facility];  Service: Orthopedics;  Laterality: Right;  . KNEE ARTHROSCOPY  2015/2017   Dr. Gustavus Bryant  . SPINE SURGERY  2009, 2011   Dr.Mark Nicoletta Dress  . TIBIA IM NAIL INSERTION Right 01/01/2020   Procedure: INTRAMEDULLARY (IM) NAIL TIBIAL;  Surgeon: Shona Needles, MD;  Location: [facility];  Service: Orthopedics;  Laterality: Right;    No Known Allergies  Allergies as of 04/20/2020   No Known Allergies     Medication List       Accurate as of April 20, 2020  1:41 PM. If you have any questions, ask your nurse or  doctor.        acetaminophen 325 MG tablet Commonly known as: TYLENOL Take 650 mg by mouth 2 (two) times daily.   ascorbic acid 500 MG tablet Commonly known as: VITAMIN C Take 500 mg by mouth daily.   aspirin EC 81 MG tablet Take 81 mg by mouth daily.   CALCIUM 600 PO Take 1 tablet by mouth daily.   cholecalciferol 25 MCG (1000 UNIT) tablet Commonly known as: VITAMIN D3 Take 2,000 Units by mouth daily.   clonazePAM 0.5  MG tablet Commonly known as: KLONOPIN Take 0.5 mg by mouth daily.   divalproex 125 MG DR tablet Commonly known as: DEPAKOTE Take 250 mg by mouth in the morning and at bedtime.   docusate sodium 100 MG capsule Commonly known as: COLACE Take 1 capsule (100 mg total) by mouth 2 (two) times daily.   DULoxetine 30 MG capsule Commonly known as: CYMBALTA Take 30 mg by mouth daily.   furosemide 20 MG tablet Commonly known as: LASIX Take 20 mg by mouth daily.   levothyroxine 25 MCG tablet Commonly known as: SYNTHROID Take 25 mcg by mouth daily before breakfast.   lisinopril 10 MG tablet Commonly known as: ZESTRIL Take 10 mg by mouth daily.   LORazepam 0.5 MG tablet Commonly known as: ATIVAN Take 0.25 mg by mouth at bedtime as needed.   memantine 10 MG tablet Commonly known as: NAMENDA Take 10 mg by mouth 2 (two) times daily.   ondansetron 4 MG disintegrating tablet Commonly known as: ZOFRAN-ODT Take 4 mg by mouth every 8 (eight) hours as needed for nausea or vomiting.   potassium chloride SA 20 MEQ tablet Commonly known as: KLOR-CON Take 20 mEq by mouth 2 (two) times daily.   QUEtiapine 25 MG tablet Commonly known as: SEROQUEL Take 25 mg by mouth at bedtime.   QUEtiapine 25 MG tablet Commonly known as: SEROQUEL Take 12.5 mg by mouth at bedtime.   traMADol 50 MG tablet Commonly known as: ULTRAM Take 0.5 tablets (25 mg total) by mouth every 6 (six) hours as needed.   vitamin B-12 1000 MCG tablet Commonly known as: CYANOCOBALAMIN Take 1,000 mcg by mouth. Once a day on Monday and Wednesday       Review of Systems  Unable to perform ROS: Dementia    Immunization History  Administered Date(s) Administered  . Influenza-Unspecified 05/29/2017  . Moderna SARS-COVID-2 Vaccination 09/07/2019, 10/05/2019  . Pneumococcal Conjugate-13 06/21/2017  . Pneumococcal Polysaccharide-23 07/16/2018  . Tdap 06/21/2017   Pertinent  Health Maintenance Due  Topic Date Due  .  DEXA SCAN  Completed  . PNA vac Low Risk Adult  Completed  . INFLUENZA VACCINE  Discontinued   Fall Risk  04/27/2018 04/25/2017  Falls in the past year? No No   Functional Status Survey:    Vitals:   04/20/20 1333  BP: (!) 104/56  Pulse: 72  Resp: 18  Temp: 98.3 F (36.8 C)  SpO2: 98%  Weight: 149 lb 12.8 oz (67.9 kg)  Height: 4\' 11"  (1.499 m)   Body mass index is 30.26 kg/m. Physical Exam Vitals reviewed.  Constitutional:      Appearance: Normal appearance.  HENT:     Head: Normocephalic.     Nose: Nose normal.     Mouth/Throat:     Mouth: Mucous membranes are moist.     Pharynx: Oropharynx is clear.  Eyes:     Pupils: Pupils are equal, round, and reactive to light.  Cardiovascular:  Rate and Rhythm: Normal rate and regular rhythm.  Pulmonary:     Effort: Pulmonary effort is normal.     Breath sounds: Normal breath sounds.  Abdominal:     General: Abdomen is flat. Bowel sounds are normal.     Palpations: Abdomen is soft.  Musculoskeletal:        General: Swelling present.     Cervical back: Neck supple.  Skin:    General: Skin is warm.  Neurological:     General: No focal deficit present.     Mental Status: She is alert.  Psychiatric:        Mood and Affect: Mood normal.     Labs reviewed: Recent Labs    01/09/20 0425 01/09/20 0425 01/10/20 0430 01/10/20 0430 01/11/20 0306 01/11/20 0306 01/15/20 0000 01/15/20 0000 01/21/20 0000 03/12/20 0000 04/10/20 0000  NA 140   < > 140   < > 140  --  142  --   --  139 141  K 3.8   < > 3.8   < > 3.6   < > 3.2*  --   --  4.6 4.2  CL 108   < > 107   < > 110   < > 109*  --   --  105 105  CO2 23   < > 23   < > 21*   < >  --   --  28* 30* 26*  GLUCOSE 103*  --  90  --  88  --   --   --   --   --   --   BUN 27*   < > 19   < > 14  --  9   < > 16 20 24*  CREATININE 0.98   < > 0.85   < > 0.74  --  0.8   < > 1.0 1.2* 1.0  CALCIUM 8.8*   < > 8.6*   < > 8.6*   < > 8.8   < > 9.8 10.1 10.4   < > = values in this  interval not displayed.   Recent Labs    01/08/20 1448 01/08/20 1448 01/09/20 0425 01/09/20 0425 01/11/20 0306 03/12/20 0000 04/10/20 0000  AST 14*   < > 11*   < > 11* 9* 10*  ALT 10   < > 9   < > 9 6* 7  ALKPHOS 58   < > 49   < > 62 107 98  BILITOT 0.7  --  0.6  --  0.8  --   --   PROT 5.9*  --  5.4*  --  5.4*  --   --   ALBUMIN 2.6*   < > 2.5*   < > 2.5* 3.5 3.8   < > = values in this interval not displayed.   Recent Labs    01/07/20 0000 01/08/20 1448 01/08/20 2035 01/09/20 0425 01/09/20 0425 01/10/20 0430 01/10/20 0430 01/11/20 0306 01/21/20 0000 03/12/20 0000 04/10/20 0000  WBC   < > 15.8*   < > 12.6*   < > 15.1*   < > 12.1* 9.0 7.2 7.9  NEUTROABS  --  12.2*  --   --   --   --   --   --   --  3,902 4,487  HGB   < > 8.4*   < > 6.9*   < > 9.9*   < > 10.8* 11.5* 11.0* 11.2*  HCT   < >  27.4*   < > 22.8*   < > 29.8*   < > 34.0* 35* 34* 34*  MCV  --  97.5   < > 98.7  --  87.9  --  91.2  --   --   --   PLT   < > 260   < > 207   < > 217   < > 266 389 234 238   < > = values in this interval not displayed.   Lab Results  Component Value Date   TSH 1.58 03/12/2020   Lab Results  Component Value Date   HGBA1C 5.3 11/01/2016   No results found for: CHOL, HDL, LDLCALC, LDLDIRECT, TRIG, CHOLHDL  Significant Diagnostic Results in last 30 days:  No results found.  Assessment/Plan Essential hypertension BP Low but usually runs in good levels Will Continue lisinopril and Lasix Mixed Alzheimer's and vascular dementia (HCC) Already on Depakote, Namenda and Seroquel Will also increase her Klonopin to 1 mg in PM to see if ti calms her Discontinue PRN Seroquel Continue Ativan PRN  Other Issues  Edema, unspecified type On low dose of Lasix Creat stable Hypothyroidism, unspecified type TSH normal in 8/21 Recurrent falls Stays High Risk Trying to avoid high doses of Ativan S/P IM Nailing for Tibial Fracture Pain Controlled on tramadol WBAT  ACP . She is  Comfort care   Family/ staff Communication:   Labs/tests ordered:

## 2020-04-22 DIAGNOSIS — F99 Mental disorder, not otherwise specified: Secondary | ICD-10-CM | POA: Diagnosis not present

## 2020-04-22 DIAGNOSIS — M6281 Muscle weakness (generalized): Secondary | ICD-10-CM | POA: Diagnosis not present

## 2020-04-22 DIAGNOSIS — Z9181 History of falling: Secondary | ICD-10-CM | POA: Diagnosis not present

## 2020-04-22 DIAGNOSIS — E039 Hypothyroidism, unspecified: Secondary | ICD-10-CM | POA: Diagnosis not present

## 2020-04-22 DIAGNOSIS — R2681 Unsteadiness on feet: Secondary | ICD-10-CM | POA: Diagnosis not present

## 2020-04-22 DIAGNOSIS — D519 Vitamin B12 deficiency anemia, unspecified: Secondary | ICD-10-CM | POA: Diagnosis not present

## 2020-04-22 DIAGNOSIS — D649 Anemia, unspecified: Secondary | ICD-10-CM | POA: Diagnosis not present

## 2020-04-22 DIAGNOSIS — L03115 Cellulitis of right lower limb: Secondary | ICD-10-CM | POA: Diagnosis not present

## 2020-04-22 DIAGNOSIS — F5105 Insomnia due to other mental disorder: Secondary | ICD-10-CM | POA: Diagnosis not present

## 2020-04-22 DIAGNOSIS — E43 Unspecified severe protein-calorie malnutrition: Secondary | ICD-10-CM | POA: Diagnosis not present

## 2020-04-22 DIAGNOSIS — R296 Repeated falls: Secondary | ICD-10-CM | POA: Diagnosis not present

## 2020-04-22 DIAGNOSIS — R509 Fever, unspecified: Secondary | ICD-10-CM | POA: Diagnosis not present

## 2020-04-22 DIAGNOSIS — D500 Iron deficiency anemia secondary to blood loss (chronic): Secondary | ICD-10-CM | POA: Diagnosis not present

## 2020-04-22 DIAGNOSIS — R29898 Other symptoms and signs involving the musculoskeletal system: Secondary | ICD-10-CM | POA: Diagnosis not present

## 2020-04-22 DIAGNOSIS — F0150 Vascular dementia without behavioral disturbance: Secondary | ICD-10-CM | POA: Diagnosis not present

## 2020-04-22 DIAGNOSIS — F331 Major depressive disorder, recurrent, moderate: Secondary | ICD-10-CM | POA: Diagnosis not present

## 2020-04-22 DIAGNOSIS — I10 Essential (primary) hypertension: Secondary | ICD-10-CM | POA: Diagnosis not present

## 2020-04-22 DIAGNOSIS — M159 Polyosteoarthritis, unspecified: Secondary | ICD-10-CM | POA: Diagnosis not present

## 2020-04-22 DIAGNOSIS — R41841 Cognitive communication deficit: Secondary | ICD-10-CM | POA: Diagnosis not present

## 2020-04-22 DIAGNOSIS — D72829 Elevated white blood cell count, unspecified: Secondary | ICD-10-CM | POA: Diagnosis not present

## 2020-04-22 DIAGNOSIS — R609 Edema, unspecified: Secondary | ICD-10-CM | POA: Diagnosis not present

## 2020-04-22 DIAGNOSIS — N1831 Chronic kidney disease, stage 3a: Secondary | ICD-10-CM | POA: Diagnosis not present

## 2020-04-22 DIAGNOSIS — W19XXXA Unspecified fall, initial encounter: Secondary | ICD-10-CM | POA: Diagnosis not present

## 2020-04-22 DIAGNOSIS — F0280 Dementia in other diseases classified elsewhere without behavioral disturbance: Secondary | ICD-10-CM | POA: Diagnosis not present

## 2020-04-23 DIAGNOSIS — E43 Unspecified severe protein-calorie malnutrition: Secondary | ICD-10-CM | POA: Diagnosis not present

## 2020-04-23 DIAGNOSIS — F0280 Dementia in other diseases classified elsewhere without behavioral disturbance: Secondary | ICD-10-CM | POA: Diagnosis not present

## 2020-04-23 DIAGNOSIS — D649 Anemia, unspecified: Secondary | ICD-10-CM | POA: Diagnosis not present

## 2020-04-23 DIAGNOSIS — M6281 Muscle weakness (generalized): Secondary | ICD-10-CM | POA: Diagnosis not present

## 2020-04-23 DIAGNOSIS — R2681 Unsteadiness on feet: Secondary | ICD-10-CM | POA: Diagnosis not present

## 2020-04-23 DIAGNOSIS — M159 Polyosteoarthritis, unspecified: Secondary | ICD-10-CM | POA: Diagnosis not present

## 2020-04-27 DIAGNOSIS — R2681 Unsteadiness on feet: Secondary | ICD-10-CM | POA: Diagnosis not present

## 2020-04-27 DIAGNOSIS — E43 Unspecified severe protein-calorie malnutrition: Secondary | ICD-10-CM | POA: Diagnosis not present

## 2020-04-27 DIAGNOSIS — F0280 Dementia in other diseases classified elsewhere without behavioral disturbance: Secondary | ICD-10-CM | POA: Diagnosis not present

## 2020-04-27 DIAGNOSIS — M159 Polyosteoarthritis, unspecified: Secondary | ICD-10-CM | POA: Diagnosis not present

## 2020-04-27 DIAGNOSIS — D649 Anemia, unspecified: Secondary | ICD-10-CM | POA: Diagnosis not present

## 2020-04-27 DIAGNOSIS — M6281 Muscle weakness (generalized): Secondary | ICD-10-CM | POA: Diagnosis not present

## 2020-04-28 DIAGNOSIS — R2681 Unsteadiness on feet: Secondary | ICD-10-CM | POA: Diagnosis not present

## 2020-04-28 DIAGNOSIS — F0280 Dementia in other diseases classified elsewhere without behavioral disturbance: Secondary | ICD-10-CM | POA: Diagnosis not present

## 2020-04-28 DIAGNOSIS — S82201D Unspecified fracture of shaft of right tibia, subsequent encounter for closed fracture with routine healing: Secondary | ICD-10-CM | POA: Diagnosis not present

## 2020-04-28 DIAGNOSIS — D649 Anemia, unspecified: Secondary | ICD-10-CM | POA: Diagnosis not present

## 2020-04-28 DIAGNOSIS — E43 Unspecified severe protein-calorie malnutrition: Secondary | ICD-10-CM | POA: Diagnosis not present

## 2020-04-28 DIAGNOSIS — M159 Polyosteoarthritis, unspecified: Secondary | ICD-10-CM | POA: Diagnosis not present

## 2020-04-28 DIAGNOSIS — M6281 Muscle weakness (generalized): Secondary | ICD-10-CM | POA: Diagnosis not present

## 2020-04-30 DIAGNOSIS — E43 Unspecified severe protein-calorie malnutrition: Secondary | ICD-10-CM | POA: Diagnosis not present

## 2020-04-30 DIAGNOSIS — D649 Anemia, unspecified: Secondary | ICD-10-CM | POA: Diagnosis not present

## 2020-04-30 DIAGNOSIS — R2681 Unsteadiness on feet: Secondary | ICD-10-CM | POA: Diagnosis not present

## 2020-04-30 DIAGNOSIS — M6281 Muscle weakness (generalized): Secondary | ICD-10-CM | POA: Diagnosis not present

## 2020-04-30 DIAGNOSIS — M159 Polyosteoarthritis, unspecified: Secondary | ICD-10-CM | POA: Diagnosis not present

## 2020-04-30 DIAGNOSIS — F0280 Dementia in other diseases classified elsewhere without behavioral disturbance: Secondary | ICD-10-CM | POA: Diagnosis not present

## 2020-05-01 DIAGNOSIS — E43 Unspecified severe protein-calorie malnutrition: Secondary | ICD-10-CM | POA: Diagnosis not present

## 2020-05-01 DIAGNOSIS — D649 Anemia, unspecified: Secondary | ICD-10-CM | POA: Diagnosis not present

## 2020-05-01 DIAGNOSIS — R2681 Unsteadiness on feet: Secondary | ICD-10-CM | POA: Diagnosis not present

## 2020-05-01 DIAGNOSIS — M6281 Muscle weakness (generalized): Secondary | ICD-10-CM | POA: Diagnosis not present

## 2020-05-01 DIAGNOSIS — M159 Polyosteoarthritis, unspecified: Secondary | ICD-10-CM | POA: Diagnosis not present

## 2020-05-01 DIAGNOSIS — F0280 Dementia in other diseases classified elsewhere without behavioral disturbance: Secondary | ICD-10-CM | POA: Diagnosis not present

## 2020-05-04 DIAGNOSIS — M6281 Muscle weakness (generalized): Secondary | ICD-10-CM | POA: Diagnosis not present

## 2020-05-04 DIAGNOSIS — R2681 Unsteadiness on feet: Secondary | ICD-10-CM | POA: Diagnosis not present

## 2020-05-04 DIAGNOSIS — E43 Unspecified severe protein-calorie malnutrition: Secondary | ICD-10-CM | POA: Diagnosis not present

## 2020-05-04 DIAGNOSIS — M159 Polyosteoarthritis, unspecified: Secondary | ICD-10-CM | POA: Diagnosis not present

## 2020-05-04 DIAGNOSIS — D649 Anemia, unspecified: Secondary | ICD-10-CM | POA: Diagnosis not present

## 2020-05-04 DIAGNOSIS — F0280 Dementia in other diseases classified elsewhere without behavioral disturbance: Secondary | ICD-10-CM | POA: Diagnosis not present

## 2020-05-05 ENCOUNTER — Non-Acute Institutional Stay (SKILLED_NURSING_FACILITY): Payer: Medicare Other | Admitting: Internal Medicine

## 2020-05-05 ENCOUNTER — Encounter: Payer: Self-pay | Admitting: Internal Medicine

## 2020-05-05 DIAGNOSIS — M159 Polyosteoarthritis, unspecified: Secondary | ICD-10-CM | POA: Diagnosis not present

## 2020-05-05 DIAGNOSIS — F419 Anxiety disorder, unspecified: Secondary | ICD-10-CM | POA: Diagnosis not present

## 2020-05-05 DIAGNOSIS — F0150 Vascular dementia without behavioral disturbance: Secondary | ICD-10-CM

## 2020-05-05 DIAGNOSIS — D649 Anemia, unspecified: Secondary | ICD-10-CM | POA: Diagnosis not present

## 2020-05-05 DIAGNOSIS — R296 Repeated falls: Secondary | ICD-10-CM | POA: Diagnosis not present

## 2020-05-05 DIAGNOSIS — F0280 Dementia in other diseases classified elsewhere without behavioral disturbance: Secondary | ICD-10-CM | POA: Diagnosis not present

## 2020-05-05 DIAGNOSIS — I10 Essential (primary) hypertension: Secondary | ICD-10-CM | POA: Diagnosis not present

## 2020-05-05 DIAGNOSIS — M6281 Muscle weakness (generalized): Secondary | ICD-10-CM | POA: Diagnosis not present

## 2020-05-05 DIAGNOSIS — G309 Alzheimer's disease, unspecified: Secondary | ICD-10-CM

## 2020-05-05 DIAGNOSIS — F32A Depression, unspecified: Secondary | ICD-10-CM

## 2020-05-05 DIAGNOSIS — F329 Major depressive disorder, single episode, unspecified: Secondary | ICD-10-CM

## 2020-05-05 DIAGNOSIS — E43 Unspecified severe protein-calorie malnutrition: Secondary | ICD-10-CM | POA: Diagnosis not present

## 2020-05-05 DIAGNOSIS — R2681 Unsteadiness on feet: Secondary | ICD-10-CM | POA: Diagnosis not present

## 2020-05-05 NOTE — Progress Notes (Signed)
Location:   [facility] Room Number: 42 Place of Service:  SNF [phone]) Provider:  Veleta Miners MD   [name], MD  Patient Care Team: [name], MD as PCP - General (Internal Medicine) Mast, Man X, NP as Nurse Practitioner (Internal Medicine) [name], MD (Internal Medicine)  Extended Emergency Contact Information Primary Emergency Contact: [facility] Address: [address]          [place], [place] [address] [name] of [place] Phone: [phone] Relation: Son Secondary Emergency Contact: [facility] Address: [address] [place] [address] [name] Phone: [phone] Relation: Daughter  Code Status:  DNR Goals of care: Advanced Directive information Advanced Directives 04/15/2020  Does Patient Have a Medical Advance Directive? Yes  Type of Advance Directive Living will;Out of facility DNR (pink MOST or yellow form)  Does patient want to make changes to medical advance directive? No - Patient declined  Copy of [place] in Chart? -  Would patient like information on creating a medical advance directive? -  Pre-existing out of facility DNR order (yellow form or pink MOST form) Yellow form placed in chart (order not valid for inpatient use)     Chief Complaint  Patient presents with  . Acute Visit    Behavior    HPI:  Pt is a 86 y.o. female seen today for an acute visit for Behaviors and Falls  [place] resident Was admitted in the hospital from 6/2to6/6 for sepsis secondary to colitis. Wasalsoadmitted in the Hospital from 5/25-5/28 for RightFibula Tibia Fractures s/p IM rod Placement Patientalsohas h/o Hypertension, Hypothyroidism, Alzheimer's Dementia with behavior issues, Depression, B12 def, S/P Right Hip fracture with Intramedullary Fixation in 8/27  Patient continues to have issues with behaviors.  Leading to her falls.  She tries to  get out of tries to walk.  Tries to leave the facility.  Gets agitated with the staff. Mostly good issues are from 2:00 in the afternoon.   Past Medical History:  Diagnosis Date  . Alzheimer disease ([place]) 10/27/2016  . Arthritis   . Confusion 04/04/2019  . Depression, recurrent ([place]) 10/27/2016  . History of fracture of right hip 04/04/2019   07/01/19 Ortho, R hip incision healed, X-ray looks good, s/p IMN R hip fx, WBAT, f/u 6 mos.   Marland Kitchen HTN (hypertension) 10/27/2016  . Hypertension   . Hypertensive kidney disease with CKD (chronic kidney disease) stage V ([place]) 11/02/2016   11/02/16 Na 136, K 4.7, Bun 30, creat 1.52, TSH 0.86, wbc 6.7, Hgb 11.3, plt 264  . Hypothyroidism 10/27/2016  . Osteoarthritis 10/27/2016  . Thyroid disease    Past Surgical History:  Procedure Laterality Date  . ABDOMINAL HYSTERECTOMY  1986  . INTRAMEDULLARY (IM) NAIL INTERTROCHANTERIC Right 04/04/2019   Procedure: INTRAMEDULLARY (IM) NAIL INTERTROCHANTRIC;  Surgeon: Rod Can, MD;  [facility];  Service: Orthopedics;  Laterality: Right;  . KNEE ARTHROSCOPY  2015/2017   Dr. Gustavus Bryant  . SPINE SURGERY  2009, 2011   Dr.Mark Nicoletta Dress  . TIBIA IM NAIL INSERTION Right 01/01/2020   Procedure: INTRAMEDULLARY (IM) NAIL TIBIAL;  Surgeon: Shona Needles, MD;  Location: [facility];  Service: Orthopedics;  Laterality: Right;    No Known Allergies  Allergies as of 05/05/2020   No Known Allergies     Medication List       Accurate as of May 05, 2020  3:05  PM. If you have any questions, ask your nurse or doctor.        acetaminophen 325 MG tablet Commonly known as: TYLENOL Take 650 mg by mouth 2 (two) times daily.   ascorbic acid 500 MG tablet Commonly known as: VITAMIN C Take 500 mg by mouth daily.   aspirin EC 81 MG tablet Take 81 mg by mouth daily.   CALCIUM 600 PO Take 1 tablet by mouth daily.   cholecalciferol 25 MCG (1000 UNIT) tablet Commonly known as: VITAMIN D3 Take 2,000 Units by mouth  daily.   clonazePAM 0.5 MG tablet Commonly known as: KLONOPIN Take 1 mg by mouth every evening.   divalproex 125 MG DR tablet Commonly known as: DEPAKOTE Take 250 mg by mouth in the morning and at bedtime.   docusate sodium 100 MG capsule Commonly known as: COLACE Take 1 capsule (100 mg total) by mouth 2 (two) times daily.   DULoxetine 30 MG capsule Commonly known as: CYMBALTA Take 30 mg by mouth daily.   furosemide 20 MG tablet Commonly known as: LASIX Take 20 mg by mouth daily.   levothyroxine 25 MCG tablet Commonly known as: SYNTHROID Take 25 mcg by mouth daily before breakfast.   lisinopril 10 MG tablet Commonly known as: ZESTRIL Take 10 mg by mouth daily.   LORazepam 0.5 MG tablet Commonly known as: ATIVAN Take 0.5 mg by mouth at bedtime as needed.   memantine 10 MG tablet Commonly known as: NAMENDA Take 10 mg by mouth 2 (two) times daily.   ondansetron 4 MG disintegrating tablet Commonly known as: ZOFRAN-ODT Take 4 mg by mouth every 8 (eight) hours as needed for nausea or vomiting.   potassium chloride SA 20 MEQ tablet Commonly known as: KLOR-CON Take 20 mEq by mouth 2 (two) times daily.   QUEtiapine 25 MG tablet Commonly known as: SEROQUEL Take 25 mg by mouth at bedtime.   traMADol 50 MG tablet Commonly known as: ULTRAM Take 0.5 tablets (25 mg total) by mouth every 6 (six) hours as needed.   vitamin B-12 1000 MCG tablet Commonly known as: CYANOCOBALAMIN Take 1,000 mcg by mouth. Once a day on Monday and Wednesday       Review of Systems  Unable to perform ROS: Dementia    Immunization History  Administered Date(s) Administered  . Influenza-Unspecified 05/29/2017  . Moderna SARS-COVID-2 Vaccination 09/07/2019, 10/05/2019  . Pneumococcal Conjugate-13 06/21/2017  . Pneumococcal Polysaccharide-23 07/16/2018  . Tdap 06/21/2017   Pertinent  Health Maintenance Due  Topic Date Due  . DEXA SCAN  Completed  . PNA vac Low Risk Adult  Completed    . INFLUENZA VACCINE  Discontinued   Fall Risk  04/27/2018 04/25/2017  Falls in the past year? No No   Functional Status Survey:    Vitals:   05/05/20 1455  BP: 140/78  Pulse: 94  Resp: 20  Temp: 98.4 F (36.9 C)  SpO2: 95%  Weight: 149 lb 12.8 oz (67.9 kg)  Height: 4\' 11"  (1.499 m)   Body mass index is 30.26 kg/m. Physical Exam Vitals reviewed.  Constitutional:      Appearance: Normal appearance.  HENT:     Head: Normocephalic.     Nose: Nose normal.     Mouth/Throat:     Mouth: Mucous membranes are moist.     Pharynx: Oropharynx is clear.  Eyes:     Pupils: Pupils are equal, round, and reactive to light.  Cardiovascular:     Rate and Rhythm:  Normal rate and regular rhythm.     Pulses: Normal pulses.  Pulmonary:     Effort: Pulmonary effort is normal. No respiratory distress.     Breath sounds: Normal breath sounds.  Abdominal:     General: Abdomen is flat. Bowel sounds are normal.     Palpations: Abdomen is soft.  Musculoskeletal:     Cervical back: Neck supple.  Neurological:     Mental Status: She is alert.     Comments: LE Weakness continues , Needs Assists with her ADLS. Wheelchair Dependent. Not oreinted  Psychiatric:        Mood and Affect: Mood normal.        Thought Content: Thought content normal.     Labs reviewed: Recent Labs    01/09/20 0425 01/09/20 0425 01/10/20 0430 01/10/20 0430 01/11/20 0306 01/11/20 0306 01/15/20 0000 01/15/20 0000 01/21/20 0000 03/12/20 0000 04/10/20 0000  NA 140   < > 140   < > 140  --  142  --   --  139 141  K 3.8   < > 3.8   < > 3.6   < > 3.2*  --   --  4.6 4.2  CL 108   < > 107   < > 110   < > 109*  --   --  105 105  CO2 23   < > 23   < > 21*   < >  --   --  28* 30* 26*  GLUCOSE 103*  --  90  --  88  --   --   --   --   --   --   BUN 27*   < > 19   < > 14  --  9   < > 16 20 24*  CREATININE 0.98   < > 0.85   < > 0.74  --  0.8   < > 1.0 1.2* 1.0  CALCIUM 8.8*   < > 8.6*   < > 8.6*   < > 8.8   < > 9.8  10.1 10.4   < > = values in this interval not displayed.   Recent Labs    01/08/20 1448 01/08/20 1448 01/09/20 0425 01/09/20 0425 01/11/20 0306 03/12/20 0000 04/10/20 0000  AST 14*   < > 11*   < > 11* 9* 10*  ALT 10   < > 9   < > 9 6* 7  ALKPHOS 58   < > 49   < > 62 107 98  BILITOT 0.7  --  0.6  --  0.8  --   --   PROT 5.9*  --  5.4*  --  5.4*  --   --   ALBUMIN 2.6*   < > 2.5*   < > 2.5* 3.5 3.8   < > = values in this interval not displayed.   Recent Labs    01/07/20 0000 01/08/20 1448 01/08/20 2035 01/09/20 0425 01/09/20 0425 01/10/20 0430 01/10/20 0430 01/11/20 0306 01/21/20 0000 03/12/20 0000 04/10/20 0000  WBC   < > 15.8*   < > 12.6*   < > 15.1*   < > 12.1* 9.0 7.2 7.9  NEUTROABS  --  12.2*  --   --   --   --   --   --   --  3,902 4,487  HGB   < > 8.4*   < > 6.9*   < > 9.9*   < >  10.8* 11.5* 11.0* 11.2*  HCT   < > 27.4*   < > 22.8*   < > 29.8*   < > 34.0* 35* 34* 34*  MCV  --  97.5   < > 98.7  --  87.9  --  91.2  --   --   --   PLT   < > 260   < > 207   < > 217   < > 266 389 234 238   < > = values in this interval not displayed.   Lab Results  Component Value Date   TSH 1.58 03/12/2020   Lab Results  Component Value Date   HGBA1C 5.3 11/01/2016   No results found for: CHOL, HDL, LDLCALC, LDLDIRECT, TRIG, CHOLHDL  Significant Diagnostic Results in last 30 days:  No results found.  Assessment/Plan Mixed Alzheimer's and vascular dementia ([place]) Unfortunately Seroquel is not working Discontinue Seroquel Start on Risperdal 1 mg BID Change Ativan to 0.5 mg BID prn Continue Klonopin, Depakote and Geophysical data processor Consult  Essential hypertension Lisinopril and Lasix Recurrent falls Working with therapy  Anxiety and depression Cymbalta and Klonipin    Pharmacist, hospital Communication:   Labs/tests ordered:

## 2020-05-06 DIAGNOSIS — F0280 Dementia in other diseases classified elsewhere without behavioral disturbance: Secondary | ICD-10-CM | POA: Diagnosis not present

## 2020-05-06 DIAGNOSIS — M6281 Muscle weakness (generalized): Secondary | ICD-10-CM | POA: Diagnosis not present

## 2020-05-06 DIAGNOSIS — E43 Unspecified severe protein-calorie malnutrition: Secondary | ICD-10-CM | POA: Diagnosis not present

## 2020-05-06 DIAGNOSIS — M159 Polyosteoarthritis, unspecified: Secondary | ICD-10-CM | POA: Diagnosis not present

## 2020-05-06 DIAGNOSIS — R2681 Unsteadiness on feet: Secondary | ICD-10-CM | POA: Diagnosis not present

## 2020-05-06 DIAGNOSIS — D649 Anemia, unspecified: Secondary | ICD-10-CM | POA: Diagnosis not present

## 2020-05-08 ENCOUNTER — Encounter: Payer: Self-pay | Admitting: Internal Medicine

## 2020-05-08 ENCOUNTER — Non-Acute Institutional Stay (SKILLED_NURSING_FACILITY): Payer: Medicare Other | Admitting: Internal Medicine

## 2020-05-08 DIAGNOSIS — R296 Repeated falls: Secondary | ICD-10-CM

## 2020-05-08 DIAGNOSIS — F0280 Dementia in other diseases classified elsewhere without behavioral disturbance: Secondary | ICD-10-CM

## 2020-05-08 DIAGNOSIS — E039 Hypothyroidism, unspecified: Secondary | ICD-10-CM | POA: Diagnosis not present

## 2020-05-08 DIAGNOSIS — F419 Anxiety disorder, unspecified: Secondary | ICD-10-CM

## 2020-05-08 DIAGNOSIS — F32A Depression, unspecified: Secondary | ICD-10-CM | POA: Diagnosis not present

## 2020-05-08 DIAGNOSIS — I10 Essential (primary) hypertension: Secondary | ICD-10-CM | POA: Diagnosis not present

## 2020-05-08 DIAGNOSIS — G309 Alzheimer's disease, unspecified: Secondary | ICD-10-CM | POA: Diagnosis not present

## 2020-05-08 DIAGNOSIS — R600 Localized edema: Secondary | ICD-10-CM | POA: Diagnosis not present

## 2020-05-08 DIAGNOSIS — F0150 Vascular dementia without behavioral disturbance: Secondary | ICD-10-CM | POA: Diagnosis not present

## 2020-05-08 NOTE — Progress Notes (Signed)
Location:   [facility] Room Number: 42 Place of Service:  SNF [phone]) Provider:  Veleta Miners MD  [name], MD  Patient Care Team: [name], MD as PCP - General (Internal Medicine) Mast, Man X, NP as Nurse Practitioner (Internal Medicine) [name], MD (Internal Medicine)  Extended Emergency Contact Information Primary Emergency Contact: [facility] Address: [address]          [place], [place] [address] [name] of [place] Phone: [phone] Relation: Son Secondary Emergency Contact: [facility] Address: [address] [place] [address] [name] Phone: [phone] Relation: Daughter  Code Status:  DNR Goals of care: Advanced Directive information Advanced Directives 04/15/2020  Does Patient Have a Medical Advance Directive? Yes  Type of Advance Directive Living will;Out of facility DNR (pink MOST or yellow form)  Does patient want to make changes to medical advance directive? No - Patient declined  Copy of [place] in Chart? -  Would patient like information on creating a medical advance directive? -  Pre-existing out of facility DNR order (yellow form or pink MOST form) Yellow form placed in chart (order not valid for inpatient use)     Chief Complaint  Patient presents with  . Acute Visit    Behavior issues    HPI:  Pt is a 86 y.o. female seen today for an acute visit for Behavior Issues and Falls and LE edema  Patientislong termcare resident Patienthas h/o Hypertension, Hypothyroidism, Alzheimer's Dementia with behavior issues, Depression, B12 def, S/P Right Hip fracture with Intramedullary Fixation in 8/20 H/O Colitis in 06/21 and Right Tibia Fibula Fracture in 5/21 due to fall S/P IM Placement  Patient continues to have issues with behaviors. Aggression leaving facility Yesterday night she did not sleep the whole night. And then fell. [place] again in  the afternoon. She is still wild awake. Has slept just few hours. No Fever or any other complains Has more LE edema Bilateral. NO coughing or SOB    Past Medical History:  Diagnosis Date  . Alzheimer disease ([place]) 10/27/2016  . Arthritis   . Confusion 04/04/2019  . Depression, recurrent ([place]) 10/27/2016  . History of fracture of right hip 04/04/2019   07/01/19 Ortho, R hip incision healed, X-ray looks good, s/p IMN R hip fx, WBAT, f/u 6 mos.   Marland Kitchen HTN (hypertension) 10/27/2016  . Hypertension   . Hypertensive kidney disease with CKD (chronic kidney disease) stage V ([place]) 11/02/2016   11/02/16 Na 136, K 4.7, Bun 30, creat 1.52, TSH 0.86, wbc 6.7, Hgb 11.3, plt 264  . Hypothyroidism 10/27/2016  . Osteoarthritis 10/27/2016  . Thyroid disease    Past Surgical History:  Procedure Laterality Date  . ABDOMINAL HYSTERECTOMY  1986  . INTRAMEDULLARY (IM) NAIL INTERTROCHANTERIC Right 04/04/2019   Procedure: INTRAMEDULLARY (IM) NAIL INTERTROCHANTRIC;  Surgeon: Rod Can, MD;  Location: [facility];  Service: Orthopedics;  Laterality: Right;  . KNEE ARTHROSCOPY  2015/2017   Dr. Gustavus Bryant  . SPINE SURGERY  2009, 2011   Dr.Mark Nicoletta Dress  . TIBIA IM NAIL INSERTION Right 01/01/2020   Procedure: INTRAMEDULLARY (IM) NAIL TIBIAL;  Surgeon: Shona Needles, MD;  Location: [place];  Service: Orthopedics;  Laterality: Right;    No Known Allergies  Allergies as of 05/08/2020   No Known Allergies     Medication List       Accurate as of  May 08, 2020  3:04 PM. If you have any questions, ask your nurse or doctor.        acetaminophen 325 MG tablet Commonly known as: TYLENOL Take 650 mg by mouth 2 (two) times daily.   ascorbic acid 500 MG tablet Commonly known as: VITAMIN C Take 500 mg by mouth daily.   aspirin EC 81 MG tablet Take 81 mg by mouth daily.   CALCIUM 600 PO Take 1 tablet by mouth daily.   cholecalciferol 25 MCG (1000 UNIT) tablet Commonly known as: VITAMIN D3 Take 2,000 Units by  mouth daily.   clonazePAM 0.5 MG tablet Commonly known as: KLONOPIN Take 1 mg by mouth every evening.   divalproex 125 MG DR tablet Commonly known as: DEPAKOTE Take 250 mg by mouth in the morning and at bedtime.   docusate sodium 100 MG capsule Commonly known as: COLACE Take 1 capsule (100 mg total) by mouth 2 (two) times daily.   DULoxetine 30 MG capsule Commonly known as: CYMBALTA Take 30 mg by mouth daily.   furosemide 20 MG tablet Commonly known as: LASIX Take 20 mg by mouth daily.   levothyroxine 25 MCG tablet Commonly known as: SYNTHROID Take 25 mcg by mouth daily before breakfast.   lisinopril 10 MG tablet Commonly known as: ZESTRIL Take 10 mg by mouth daily.   LORazepam 0.5 MG tablet Commonly known as: ATIVAN Take 0.5 mg by mouth 2 (two) times daily as needed.   memantine 10 MG tablet Commonly known as: NAMENDA Take 10 mg by mouth 2 (two) times daily.   ondansetron 4 MG disintegrating tablet Commonly known as: ZOFRAN-ODT Take 4 mg by mouth every 8 (eight) hours as needed for nausea or vomiting.   potassium chloride SA 20 MEQ tablet Commonly known as: KLOR-CON Take 20 mEq by mouth 2 (two) times daily.   risperiDONE 1 MG tablet Commonly known as: RISPERDAL Take 1 mg by mouth 2 (two) times daily.   traMADol 50 MG tablet Commonly known as: ULTRAM Take 0.5 tablets (25 mg total) by mouth every 6 (six) hours as needed.   vitamin B-12 1000 MCG tablet Commonly known as: CYANOCOBALAMIN Take 1,000 mcg by mouth. Once a day on Monday and Wednesday       Review of Systems  Unable to perform ROS: Dementia    Immunization History  Administered Date(s) Administered  . Influenza-Unspecified 05/29/2017  . Moderna SARS-COVID-2 Vaccination 09/07/2019, 10/05/2019  . Pneumococcal Conjugate-13 06/21/2017  . Pneumococcal Polysaccharide-23 07/16/2018  . Tdap 06/21/2017   Pertinent  Health Maintenance Due  Topic Date Due  . DEXA SCAN  Completed  . PNA vac Low  Risk Adult  Completed  . INFLUENZA VACCINE  Discontinued   Fall Risk  04/27/2018 04/25/2017  Falls in the past year? No No   Functional Status Survey:    Vitals:   05/08/20 1457  BP: 122/72  Pulse: 92  Resp: 20  Temp: (!) 97.2 F (36.2 C)  SpO2: 93%  Weight: 152 lb 14.4 oz (69.4 kg)  Height: 4\' 11"  (1.499 m)   Body mass index is 30.88 kg/m. Physical Exam Vitals reviewed.  Constitutional:      Appearance: Normal appearance.  HENT:     Head: Normocephalic.     Nose: Nose normal.     Mouth/Throat:     Mouth: Mucous membranes are moist.  Cardiovascular:     Rate and Rhythm: Normal rate and regular rhythm.     Pulses: Normal pulses.  Pulmonary:  Effort: Pulmonary effort is normal. No respiratory distress.     Breath sounds: Normal breath sounds. No wheezing or rales.  Abdominal:     General: Abdomen is flat. Bowel sounds are normal.     Palpations: Abdomen is soft.  Musculoskeletal:        General: Swelling present.     Cervical back: Neck supple.     Comments: No Pain  Skin:    General: Skin is warm.  Neurological:     General: No focal deficit present.     Mental Status: She is alert.  Psychiatric:        Mood and Affect: Mood normal.     Comments: Looks Tired as she has not slept But still restless     Labs reviewed: Recent Labs    01/09/20 0425 01/09/20 0425 01/10/20 0430 01/10/20 0430 01/11/20 0306 01/11/20 0306 01/15/20 0000 01/15/20 0000 01/21/20 0000 03/12/20 0000 04/10/20 0000  NA 140   < > 140   < > 140  --  142  --   --  139 141  K 3.8   < > 3.8   < > 3.6   < > 3.2*  --   --  4.6 4.2  CL 108   < > 107   < > 110   < > 109*  --   --  105 105  CO2 23   < > 23   < > 21*   < >  --   --  28* 30* 26*  GLUCOSE 103*  --  90  --  88  --   --   --   --   --   --   BUN 27*   < > 19   < > 14  --  9   < > 16 20 24*  CREATININE 0.98   < > 0.85   < > 0.74  --  0.8   < > 1.0 1.2* 1.0  CALCIUM 8.8*   < > 8.6*   < > 8.6*   < > 8.8   < > 9.8 10.1 10.4     < > = values in this interval not displayed.   Recent Labs    01/08/20 1448 01/08/20 1448 01/09/20 0425 01/09/20 0425 01/11/20 0306 03/12/20 0000 04/10/20 0000  AST 14*   < > 11*   < > 11* 9* 10*  ALT 10   < > 9   < > 9 6* 7  ALKPHOS 58   < > 49   < > 62 107 98  BILITOT 0.7  --  0.6  --  0.8  --   --   PROT 5.9*  --  5.4*  --  5.4*  --   --   ALBUMIN 2.6*   < > 2.5*   < > 2.5* 3.5 3.8   < > = values in this interval not displayed.   Recent Labs    01/07/20 0000 01/08/20 1448 01/08/20 2035 01/09/20 0425 01/09/20 0425 01/10/20 0430 01/10/20 0430 01/11/20 0306 01/21/20 0000 03/12/20 0000 04/10/20 0000  WBC   < > 15.8*   < > 12.6*   < > 15.1*   < > 12.1* 9.0 7.2 7.9  NEUTROABS  --  12.2*  --   --   --   --   --   --   --  3,902 4,487  HGB   < > 8.4*   < > 6.9*   < >  9.9*   < > 10.8* 11.5* 11.0* 11.2*  HCT   < > 27.4*   < > 22.8*   < > 29.8*   < > 34.0* 35* 34* 34*  MCV  --  97.5   < > 98.7  --  87.9  --  91.2  --   --   --   PLT   < > 260   < > 207   < > 217   < > 266 389 234 238   < > = values in this interval not displayed.   Lab Results  Component Value Date   TSH 1.58 03/12/2020   Lab Results  Component Value Date   HGBA1C 5.3 11/01/2016   No results found for: CHOL, HDL, LDLCALC, LDLDIRECT, TRIG, CHOLHDL  Significant Diagnostic Results in last 30 days:  No results found.  Assessment/Plan Bilateral edema of lower extremity incrase Lasix to 40 mg QD and potassium to 40 meq QD BMET in 1 week  Recurrent falls Continues to be at risk due to her Dementia Mixed Alzheimer's and vascular dementia ([place]) Change Resperidal to 2 mg BID Increase Klonipin to 0.5 mg BID continue PRN Ativan Continue Klonopin, Depakote and Namenda Psycoholgist Consult Essential hypertension Lisinopril and Lasix  Anxiety and depression Cymbalta and Klonipin Hypothyroidism, unspecified type TSH normal in 8/21    Family/ staff Communication:   Labs/tests ordered:

## 2020-05-13 ENCOUNTER — Other Ambulatory Visit: Payer: Self-pay | Admitting: *Deleted

## 2020-05-13 MED ORDER — CLONAZEPAM 0.5 MG PO TABS: 1.0000 mg | ORAL_TABLET | Freq: Two times a day (BID) | ORAL | 0 refills | Status: DC

## 2020-05-13 NOTE — Telephone Encounter (Signed)
Received refill Request from [facility] Pended Rx and sent to Dr. Gupta for approval.  

## 2020-05-14 ENCOUNTER — Encounter: Payer: Self-pay | Admitting: Nurse Practitioner

## 2020-05-14 ENCOUNTER — Non-Acute Institutional Stay (SKILLED_NURSING_FACILITY): Payer: Medicare Other | Admitting: Nurse Practitioner

## 2020-05-14 DIAGNOSIS — F0280 Dementia in other diseases classified elsewhere without behavioral disturbance: Secondary | ICD-10-CM

## 2020-05-14 DIAGNOSIS — G309 Alzheimer's disease, unspecified: Secondary | ICD-10-CM | POA: Diagnosis not present

## 2020-05-14 DIAGNOSIS — F5105 Insomnia due to other mental disorder: Secondary | ICD-10-CM

## 2020-05-14 DIAGNOSIS — F419 Anxiety disorder, unspecified: Secondary | ICD-10-CM | POA: Diagnosis not present

## 2020-05-14 DIAGNOSIS — M159 Polyosteoarthritis, unspecified: Secondary | ICD-10-CM

## 2020-05-14 DIAGNOSIS — F0150 Vascular dementia without behavioral disturbance: Secondary | ICD-10-CM | POA: Diagnosis not present

## 2020-05-14 DIAGNOSIS — R609 Edema, unspecified: Secondary | ICD-10-CM | POA: Diagnosis not present

## 2020-05-14 DIAGNOSIS — N1831 Chronic kidney disease, stage 3a: Secondary | ICD-10-CM | POA: Diagnosis not present

## 2020-05-14 DIAGNOSIS — E039 Hypothyroidism, unspecified: Secondary | ICD-10-CM

## 2020-05-14 DIAGNOSIS — I10 Essential (primary) hypertension: Secondary | ICD-10-CM | POA: Diagnosis not present

## 2020-05-14 DIAGNOSIS — F32A Depression, unspecified: Secondary | ICD-10-CM

## 2020-05-14 DIAGNOSIS — F99 Mental disorder, not otherwise specified: Secondary | ICD-10-CM | POA: Diagnosis not present

## 2020-05-14 NOTE — Progress Notes (Signed)
Location:   [place] Room Number: 42 Place of Service:  SNF (31) Provider:  Shandie Bertz, Lennie Odor NP  [name], MD  Patient Care Team: [name], MD as PCP - General (Internal Medicine) Erinn Mendosa X, NP as Nurse Practitioner (Internal Medicine) [name], MD (Internal Medicine)  Extended Emergency Contact Information Primary Emergency Contact: [facility] Address: [address]          [place], [place] [address] [name] of [place] Phone: [phone] Relation: Son Secondary Emergency Contact: [facility] Address: [address] [place] [address] [name] Phone: [phone] Relation: Daughter  Code Status:  DNR Goals of care: Advanced Directive information Advanced Directives 05/14/2020  Does Patient Have a Medical Advance Directive? Yes  Type of Paramedic of [place];Out of facility DNR (pink MOST or yellow form);Living will  Does patient want to make changes to medical advance directive? No - Patient declined  Copy of [place] in Chart? Yes - validated most recent copy scanned in chart (See row information)  Would patient like information on creating a medical advance directive? -  Pre-existing out of facility DNR order (yellow form or pink MOST form) Yellow form placed in chart (order not valid for inpatient use)     Chief Complaint  Patient presents with   Medical Management of Chronic Issues    HPI:  Pt is a 86 y.o. female seen today for medical management of chronic diseases.    Dementia, impulsive, no safety awareness, takes Memantine, resides in SNF [facility]  Depression/anxiety: takes Risperdal 12m bid, Clonazepam  bid, prn Lorazepam, Depakote, Duloxetine.   HTN, takes Lisinopril  Edema BLE, takes Furosemide.   Hypothyroidism, TSH wnl 03/2020, takes Levothyroxine.   OA, in general, takes Tylenol.    Past Medical History:  Diagnosis Date    Alzheimer disease ([place] 10/27/2016   Arthritis    Confusion 04/04/2019   Depression, recurrent ([place] 10/27/2016   History of fracture of right hip 04/04/2019   07/01/19 Ortho, R hip incision healed, X-ray looks good, s/p IMN R hip fx, WBAT, f/u 6 mos.    HTN (hypertension) 10/27/2016   Hypertension    Hypertensive kidney disease with CKD (chronic kidney disease) stage V ([place] 11/02/2016   11/02/16 Na 136, K 4.7, Bun 30, creat 1.52, TSH 0.86, wbc 6.7, Hgb 11.3, plt 264   Hypothyroidism 10/27/2016   Osteoarthritis 10/27/2016   Thyroid disease    Past Surgical History:  Procedure Laterality Date   ABDOMINAL HYSTERECTOMY  1986   INTRAMEDULLARY (IM) NAIL INTERTROCHANTERIC Right 04/04/2019   Procedure: INTRAMEDULLARY (IM) NAIL INTERTROCHANTRIC;  Surgeon: SRod Can MD;  Location: [facility];  Service: Orthopedics;  Laterality: Right;   KNEE ARTHROSCOPY  2015/2017   Dr. LGustavus Bryant  SPINE SURGERY  2009, 2011   Dr.Mark LNicoletta Dress  TIBIA IM NAIL INSERTION Right 01/01/2020   Procedure: INTRAMEDULLARY (IM) NAIL TIBIAL;  Surgeon: HShona Needles MD;  [place]  Service: Orthopedics;  Laterality: Right;    No Known Allergies  Allergies as of 05/14/2020   No Known Allergies     Medication List       Accurate as of May 14, 2020 11:59 PM. If you have any questions, ask your nurse or doctor.        acetaminophen 325 MG tablet Commonly known as: TYLENOL Take 650 mg by mouth 2 (two)  times daily.   ascorbic acid 500 MG tablet Commonly known as: VITAMIN C Take 500 mg by mouth daily.   aspirin EC 81 MG tablet Take 81 mg by mouth daily.   CALCIUM 600 PO Take 1 tablet by mouth daily.   cholecalciferol 25 MCG (1000 UNIT) tablet Commonly known as: VITAMIN D3 Take 2,000 Units by mouth daily.   clonazePAM 0.5 MG tablet Commonly known as: KLONOPIN Take 2 tablets (1 mg total) by mouth 2 (two) times daily.   clotrimazole-betamethasone cream Commonly known as:  LOTRISONE Apply 1 application topically 2 (two) times daily.   divalproex 125 MG DR tablet Commonly known as: DEPAKOTE Take 250 mg by mouth in the morning and at bedtime.   docusate sodium 100 MG capsule Commonly known as: COLACE Take 1 capsule (100 mg total) by mouth 2 (two) times daily.   DULoxetine 30 MG capsule Commonly known as: CYMBALTA Take 30 mg by mouth daily.   furosemide 20 MG tablet Commonly known as: LASIX Take 40 mg by mouth daily.   levothyroxine 25 MCG tablet Commonly known as: SYNTHROID Take 25 mcg by mouth daily before breakfast.   lisinopril 10 MG tablet Commonly known as: ZESTRIL Take 10 mg by mouth daily.   LORazepam 0.5 MG tablet Commonly known as: ATIVAN Take 0.5 mg by mouth 2 (two) times daily as needed.   memantine 10 MG tablet Commonly known as: NAMENDA Take 10 mg by mouth 2 (two) times daily.   ondansetron 4 MG disintegrating tablet Commonly known as: ZOFRAN-ODT Take 4 mg by mouth every 8 (eight) hours as needed for nausea or vomiting.   potassium chloride SA 20 MEQ tablet Commonly known as: KLOR-CON Take 20 mEq by mouth 2 (two) times daily.   risperiDONE 1 MG tablet Commonly known as: RISPERDAL Take 1 mg by mouth 2 (two) times daily.   traMADol 50 MG tablet Commonly known as: ULTRAM Take 0.5 tablets (25 mg total) by mouth every 6 (six) hours as needed.   vitamin B-12 1000 MCG tablet Commonly known as: CYANOCOBALAMIN Take 1,000 mcg by mouth. Once a day on Monday and Wednesday       Review of Systems  Constitutional: Negative for fever and unexpected weight change.       Sleepy upon my visit today, staff reported the patient not sleeping well at night.   HENT: Positive for hearing loss. Negative for congestion and voice change.   Eyes: Negative for visual disturbance.  Respiratory: Negative for cough and shortness of breath.   Cardiovascular: Positive for leg swelling.  Gastrointestinal: Negative for abdominal pain and  diarrhea.  Genitourinary: Negative for dysuria, frequency and urgency.  Musculoskeletal: Positive for arthralgias and gait problem.       S/p R lower leg IM tibia  Skin: Negative for color change.  Neurological: Negative for dizziness, speech difficulty and weakness.       Dementia, lethargic.   Psychiatric/Behavioral: Positive for agitation, behavioral problems, confusion and sleep disturbance. The patient is nervous/anxious.     Immunization History  Administered Date(s) Administered   Influenza-Unspecified 05/29/2017   Moderna SARS-COVID-2 Vaccination 09/07/2019, 10/05/2019   Pneumococcal Conjugate-13 06/21/2017   Pneumococcal Polysaccharide-23 07/16/2018   Tdap 06/21/2017   Pertinent  Health Maintenance Due  Topic Date Due   DEXA SCAN  Completed   PNA vac Low Risk Adult  Completed   INFLUENZA VACCINE  Discontinued   Fall Risk  04/27/2018 04/25/2017  Falls in the past year? No No  Functional Status Survey:    Vitals:   05/14/20 0935  BP: 120/60  Pulse: 84  Resp: 16  Temp: (!) 97.4 F (36.3 C)  SpO2: 94%  Weight: 152 lb 14.4 oz (69.4 kg)  Height: 4' 11"  (1.499 m)   Body mass index is 30.88 kg/m. Physical Exam Vitals and nursing note reviewed.  Constitutional:      Appearance: Normal appearance.  HENT:     Head: Normocephalic and atraumatic.     Mouth/Throat:     Mouth: Mucous membranes are moist.  Eyes:     Conjunctiva/sclera: Conjunctivae normal.     Pupils: Pupils are equal, round, and reactive to light.  Cardiovascular:     Rate and Rhythm: Normal rate and regular rhythm.     Heart sounds: No murmur heard.   Pulmonary:     Effort: Pulmonary effort is normal.     Breath sounds: No rales.  Abdominal:     General: Bowel sounds are normal.     Palpations: Abdomen is soft.     Tenderness: There is no abdominal tenderness.  Musculoskeletal:     Cervical back: Normal range of motion and neck supple.     Right lower leg: Edema present.     Left  lower leg: Edema present.     Comments: R s/p IM rod for tibia fx. Trace edema BLE  Skin:    General: Skin is warm and dry.  Neurological:     General: No focal deficit present.     Mental Status: She is alert.     Coordination: Coordination normal.     Gait: Gait abnormal.  Psychiatric:        Mood and Affect: Mood normal.     Comments: Smiled upon my examination, confused.      Labs reviewed: Recent Labs    01/09/20 0425 01/09/20 0425 01/10/20 0430 01/10/20 0430 01/11/20 0306 01/11/20 0306 01/15/20 0000 01/15/20 0000 01/21/20 0000 03/12/20 0000 04/10/20 0000  NA 140   < > 140   < > 140  --  142  --   --  139 141  K 3.8   < > 3.8   < > 3.6   < > 3.2*  --   --  4.6 4.2  CL 108   < > 107   < > 110   < > 109*  --   --  105 105  CO2 23   < > 23   < > 21*   < >  --   --  28* 30* 26*  GLUCOSE 103*  --  90  --  88  --   --   --   --   --   --   BUN 27*   < > 19   < > 14  --  9   < > 16 20 24*  CREATININE 0.98   < > 0.85   < > 0.74  --  0.8   < > 1.0 1.2* 1.0  CALCIUM 8.8*   < > 8.6*   < > 8.6*   < > 8.8   < > 9.8 10.1 10.4   < > = values in this interval not displayed.   Recent Labs    01/08/20 1448 01/08/20 1448 01/09/20 0425 01/09/20 0425 01/11/20 0306 03/12/20 0000 04/10/20 0000  AST 14*   < > 11*   < > 11* 9* 10*  ALT 10   < > 9   < >  9 6* 7  ALKPHOS 58   < > 49   < > 62 107 98  BILITOT 0.7  --  0.6  --  0.8  --   --   PROT 5.9*  --  5.4*  --  5.4*  --   --   ALBUMIN 2.6*   < > 2.5*   < > 2.5* 3.5 3.8   < > = values in this interval not displayed.   Recent Labs    01/07/20 0000 01/08/20 1448 01/08/20 2035 01/09/20 0425 01/09/20 0425 01/10/20 0430 01/10/20 0430 01/11/20 0306 01/21/20 0000 03/12/20 0000 04/10/20 0000  WBC   < > 15.8*   < > 12.6*   < > 15.1*   < > 12.1* 9.0 7.2 7.9  NEUTROABS  --  12.2*  --   --   --   --   --   --   --  3,902 4,487  HGB   < > 8.4*   < > 6.9*   < > 9.9*   < > 10.8* 11.5* 11.0* 11.2*  HCT   < > 27.4*   < > 22.8*   < >  29.8*   < > 34.0* 35* 34* 34*  MCV  --  97.5   < > 98.7  --  87.9  --  91.2  --   --   --   PLT   < > 260   < > 207   < > 217   < > 266 389 234 238   < > = values in this interval not displayed.   Lab Results  Component Value Date   TSH 1.58 03/12/2020   Lab Results  Component Value Date   HGBA1C 5.3 11/01/2016   No results found for: CHOL, HDL, LDLCALC, LDLDIRECT, TRIG, CHOLHDL  Significant Diagnostic Results in last 30 days:  No results found.  Assessment/Plan Essential hypertension Blood pressure is controlled, continue Lisinopril, Furosemide.   Mixed Alzheimer's and vascular dementia ([place]) Dementia, impulsive, no safety awareness, takes Memantine, resides in SNF [facility]   Hypothyroidism Stable, continue Levothyroxine.   Osteoarthritis In general, continue Tylenol.   Anxiety and depression Depression/anxiety: impulsive, episodes of escalated anxiety, takes Risperdal 73m bid, Clonazepam  bid, prn Lorazepam, Depakote, Duloxetine. The patient seems sleeping more during day, may consider GDR if persists.  05/15/20 Psych, Dr GLyndel Safe Depakote sprinkles 2549m1pm, 8pm, dc am Risperdal, continue Risperdal qpm, dc Lorazepam. Try Klonopin 0.2580m6hr prn x 14 days.    Edema Mild edema BLE, continue Furosemide.   Insomnia Lifelong issue, persisted  Chronic kidney disease, stage 3a ([place]0/7/21 Na 142, K 5.1, Bun 33, creat 1.27, eGFR 38, BMP one week     Family/ staff Communication: plan of care reviewed with the patient and charge nurse.   Labs/tests ordered:  none  Time spend 35 minutes.

## 2020-05-14 NOTE — Assessment & Plan Note (Signed)
Lifelong issue, persisted

## 2020-05-14 NOTE — Assessment & Plan Note (Signed)
Dementia, impulsive, no safety awareness, takes Memantine, resides in SNF [facility] 

## 2020-05-14 NOTE — Assessment & Plan Note (Signed)
Stable, continue Levothyroxine.  

## 2020-05-14 NOTE — Assessment & Plan Note (Addendum)
Depression/anxiety: impulsive, episodes of escalated anxiety, takes Risperdal 2mg  bid, Clonazepam  bid, prn Lorazepam, Depakote, Duloxetine. The patient seems sleeping more during day, may consider GDR if persists.  05/15/20 Psych, Dr Lyndel Safe: Depakote sprinkles 250mg  1pm, 8pm, dc am Risperdal, continue Risperdal qpm, dc Lorazepam. Try Klonopin 0.25mg  q6hr prn x 14 days.

## 2020-05-14 NOTE — Assessment & Plan Note (Signed)
Mild edema BLE, continue Furosemide.

## 2020-05-14 NOTE — Assessment & Plan Note (Signed)
In general, continue Tylenol.

## 2020-05-14 NOTE — Assessment & Plan Note (Signed)
Blood pressure is controlled, continue Lisinopril, Furosemide.  

## 2020-05-15 ENCOUNTER — Encounter: Payer: Self-pay | Admitting: Nurse Practitioner

## 2020-05-15 LAB — BASIC METABOLIC PANEL
BUN: 33 — AB (ref 4–21)
CO2: 25 — AB (ref 13–22)
Chloride: 107 (ref 99–108)
Creatinine: 1.3 — AB (ref 0.5–1.1)
Glucose: 97
Potassium: 5.1 (ref 3.4–5.3)
Sodium: 142 (ref 137–147)

## 2020-05-15 LAB — COMPREHENSIVE METABOLIC PANEL: Calcium: 10.5 (ref 8.7–10.7)

## 2020-05-15 NOTE — Assessment & Plan Note (Signed)
05/14/20 Na 142, K 5.1, Bun 33, creat 1.27, eGFR 38, BMP one week

## 2020-05-25 ENCOUNTER — Non-Acute Institutional Stay (SKILLED_NURSING_FACILITY): Payer: Medicare Other | Admitting: Internal Medicine

## 2020-05-25 ENCOUNTER — Encounter: Payer: Self-pay | Admitting: Internal Medicine

## 2020-05-25 DIAGNOSIS — N8110 Cystocele, unspecified: Secondary | ICD-10-CM

## 2020-05-25 DIAGNOSIS — I10 Essential (primary) hypertension: Secondary | ICD-10-CM | POA: Diagnosis not present

## 2020-05-25 DIAGNOSIS — F0280 Dementia in other diseases classified elsewhere without behavioral disturbance: Secondary | ICD-10-CM

## 2020-05-25 DIAGNOSIS — R600 Localized edema: Secondary | ICD-10-CM

## 2020-05-25 DIAGNOSIS — E039 Hypothyroidism, unspecified: Secondary | ICD-10-CM

## 2020-05-25 DIAGNOSIS — K644 Residual hemorrhoidal skin tags: Secondary | ICD-10-CM | POA: Diagnosis not present

## 2020-05-25 DIAGNOSIS — G309 Alzheimer's disease, unspecified: Secondary | ICD-10-CM | POA: Diagnosis not present

## 2020-05-25 DIAGNOSIS — N816 Rectocele: Secondary | ICD-10-CM

## 2020-05-25 DIAGNOSIS — F0150 Vascular dementia without behavioral disturbance: Secondary | ICD-10-CM | POA: Diagnosis not present

## 2020-05-25 NOTE — Progress Notes (Signed)
Location:   [place] Room Number: 42 Place of Service:  SNF [id number]) Provider:  Veleta Miners MD  [name], MD  Patient Care Team: [name], MD as PCP - General (Internal Medicine) Mast, Man X, NP as Nurse Practitioner (Internal Medicine) [name], MD (Internal Medicine)  Extended Emergency Contact Information Primary Emergency Contact: [facility] Address: [address]          [place], [place] [address] [name] of [place] Phone: [phone] Relation: Son Secondary Emergency Contact: [facility] Address: [address] [place] [address] [name] Phone: [phone] Relation: Daughter  Code Status:  DNR Goals of care: Advanced Directive information Advanced Directives 05/14/2020  Does Patient Have a Medical Advance Directive? Yes  Type of Paramedic of [place];Out of facility DNR (pink MOST or yellow form);Living will  Does patient want to make changes to medical advance directive? No - Patient declined  Copy of [place] in Chart? Yes - validated most recent copy scanned in chart (See row information)  Would patient like information on creating a medical advance directive? -  Pre-existing out of facility DNR order (yellow form or pink MOST form) Yellow form placed in chart (order not valid for inpatient use)     Chief Complaint  Patient presents with  . Acute Visit    Hemmorrhoids    HPI:  Pt is a 86 y.o. female seen today for an acute visit for ? Hemorrhoids  [facility] termcare resident Patienthas h/o Hypertension, Hypothyroidism, Alzheimer's Dementia with behavior issues, Depression, B12 def, S/P Right Hip fracture with Intramedullary Fixation in 8/20 H/O Colitis in 06/21 and Right Tibia Fibula Fracture in 5/21 due to fall S/P IM Placement  Nurses noticed Hemorrhoids yesterday. No Bleeding or discomfort to patient.  She  continues to have Behavior issues needing Klonopin PRN. Her LE edema is better. Patient unable to give any history due to her dementia   Past Medical History:  Diagnosis Date  . Alzheimer disease ([place]) 10/27/2016  . Arthritis   . Confusion 04/04/2019  . Depression, recurrent ([place]) 10/27/2016  . History of fracture of right hip 04/04/2019   07/01/19 Ortho, R hip incision healed, X-ray looks good, s/p IMN R hip fx, WBAT, f/u 6 mos.   [place] Kitchen HTN (hypertension) 10/27/2016  . Hypertension   . Hypertensive kidney disease with CKD (chronic kidney disease) stage V ([place]) 11/02/2016   11/02/16 Na 136, K 4.7, Bun 30, creat 1.52, TSH 0.86, wbc 6.7, Hgb 11.3, plt 264  . Hypothyroidism 10/27/2016  . Osteoarthritis 10/27/2016  . Thyroid disease    Past Surgical History:  Procedure Laterality Date  . ABDOMINAL HYSTERECTOMY  1986  . INTRAMEDULLARY (IM) NAIL INTERTROCHANTERIC Right 04/04/2019   Procedure: INTRAMEDULLARY (IM) NAIL INTERTROCHANTRIC;  Surgeon: Rod Can, MD;  Location: [facility];  Service: Orthopedics;  Laterality: Right;  . KNEE ARTHROSCOPY  2015/2017   Dr. Gustavus Bryant  . SPINE SURGERY  2009, 2011   Dr.Mark Nicoletta Dress  . TIBIA IM NAIL INSERTION Right 01/01/2020   Procedure: INTRAMEDULLARY (IM) NAIL TIBIAL;  Surgeon: Shona Needles, MD;  Location: [facility];  Service: Orthopedics;  Laterality: Right;    No Known Allergies  Allergies as of 05/25/2020   No Known Allergies     Medication List       Accurate as of May 25, 2020  1:38 PM. If you have any  questions, ask your nurse or doctor.        acetaminophen 325 MG tablet Commonly known as: TYLENOL Take 650 mg by mouth 2 (two) times daily.   ascorbic acid 500 MG tablet Commonly known as: VITAMIN C Take 500 mg by mouth daily.   aspirin EC 81 MG tablet Take 81 mg by mouth daily.   CALCIUM 600 PO Take 1 tablet by mouth daily.   cholecalciferol 25 MCG (1000 UNIT) tablet Commonly known as: VITAMIN D3 Take 2,000 Units by mouth daily.    clonazePAM 0.5 MG tablet Commonly known as: KLONOPIN Take 0.25 mg by mouth every 6 (six) hours as needed for anxiety. What changed: Another medication with the same name was removed. Continue taking this medication, and follow the directions you see here. Changed by: [name], MD   clotrimazole-betamethasone cream Commonly known as: LOTRISONE Apply 1 application topically 2 (two) times daily.   divalproex 125 MG DR tablet Commonly known as: DEPAKOTE Take 250 mg by mouth in the morning and at bedtime.   docusate sodium 100 MG capsule Commonly known as: COLACE Take 1 capsule (100 mg total) by mouth 2 (two) times daily.   DULoxetine 30 MG capsule Commonly known as: CYMBALTA Take 30 mg by mouth daily.   furosemide 20 MG tablet Commonly known as: LASIX Take 40 mg by mouth daily.   levothyroxine 25 MCG tablet Commonly known as: SYNTHROID Take 25 mcg by mouth daily before breakfast.   lisinopril 10 MG tablet Commonly known as: ZESTRIL Take 10 mg by mouth daily.   memantine 10 MG tablet Commonly known as: NAMENDA Take 10 mg by mouth 2 (two) times daily.   ondansetron 4 MG disintegrating tablet Commonly known as: ZOFRAN-ODT Take 4 mg by mouth every 8 (eight) hours as needed for nausea or vomiting.   potassium chloride SA 20 MEQ tablet Commonly known as: KLOR-CON Take 20 mEq by mouth 2 (two) times daily.   risperiDONE 2 MG tablet Commonly known as: RISPERDAL Take 2 mg by mouth at bedtime.   traMADol 50 MG tablet Commonly known as: ULTRAM Take 0.5 tablets (25 mg total) by mouth every 6 (six) hours as needed.   vitamin B-12 1000 MCG tablet Commonly known as: CYANOCOBALAMIN Take 1,000 mcg by mouth. Once a day on Monday and Wednesday       Review of Systems  Unable to perform ROS: Dementia    Immunization History  Administered Date(s) Administered  . Influenza-Unspecified 05/29/2017  . Moderna SARS-COVID-2 Vaccination 09/07/2019, 10/05/2019  .  Pneumococcal Conjugate-13 06/21/2017  . Pneumococcal Polysaccharide-23 07/16/2018  . Tdap 06/21/2017   Pertinent  Health Maintenance Due  Topic Date Due  . DEXA SCAN  Completed  . PNA vac Low Risk Adult  Completed  . INFLUENZA VACCINE  Discontinued   Fall Risk  04/27/2018 04/25/2017  Falls in the past year? No No   Functional Status Survey:    Vitals:   05/25/20 1327  BP: (!) 142/90  Pulse: (!) 103  Resp: 16  Temp: 98.6 F (37 C)  SpO2: 92%  Weight: 152 lb 14.4 oz (69.4 kg)  Height: 4\' 11"  (1.499 m)   Body mass index is 30.88 kg/m. Physical Exam Vitals reviewed.  Constitutional:      Appearance: Normal appearance.  HENT:     Head: Normocephalic.     Nose: Nose normal.     Mouth/Throat:     Mouth: Mucous membranes are moist.     Pharynx: Oropharynx  is clear.  Eyes:     Pupils: Pupils are equal, round, and reactive to light.  Cardiovascular:     Rate and Rhythm: Normal rate and regular rhythm.     Pulses: Normal pulses.  Pulmonary:     Effort: Pulmonary effort is normal.     Breath sounds: Normal breath sounds.  Abdominal:     General: Abdomen is flat. Bowel sounds are normal.     Palpations: Abdomen is soft.  Genitourinary:    Comments: Patient had Hemorrhoids But not inflamed Musculoskeletal:     Cervical back: Neck supple.     Comments: Swelling much improved  Skin:    General: Skin is warm.  Neurological:     General: No focal deficit present.     Mental Status: She is alert.     Comments: Alert and Follows commands but not Oriented  Psychiatric:        Mood and Affect: Mood normal.        Thought Content: Thought content normal.     Comments: COnfused     Labs reviewed: Recent Labs    01/09/20 0425 01/09/20 0425 01/10/20 0430 01/10/20 0430 01/11/20 0306 01/15/20 0000 03/12/20 0000 04/10/20 0000 05/15/20 0000  NA 140   < > 140   < > 140   < > 139 141 142  K 3.8   < > 3.8   < > 3.6   < > 4.6 4.2 5.1  CL 108   < > 107   < > 110   < >  105 105 107  CO2 23   < > 23   < > 21*   < > 30* 26* 25*  GLUCOSE 103*  --  90  --  88  --   --   --   --   BUN 27*   < > 19   < > 14   < > 20 24* 33*  CREATININE 0.98   < > 0.85   < > 0.74   < > 1.2* 1.0 1.3*  CALCIUM 8.8*   < > 8.6*   < > 8.6*   < > 10.1 10.4 10.5   < > = values in this interval not displayed.   Recent Labs    01/08/20 1448 01/08/20 1448 01/09/20 0425 01/09/20 0425 01/11/20 0306 03/12/20 0000 04/10/20 0000  AST 14*   < > 11*   < > 11* 9* 10*  ALT 10   < > 9   < > 9 6* 7  ALKPHOS 58   < > 49   < > 62 107 98  BILITOT 0.7  --  0.6  --  0.8  --   --   PROT 5.9*  --  5.4*  --  5.4*  --   --   ALBUMIN 2.6*   < > 2.5*   < > 2.5* 3.5 3.8   < > = values in this interval not displayed.   Recent Labs    01/07/20 0000 01/08/20 1448 01/08/20 2035 01/09/20 0425 01/09/20 0425 01/10/20 0430 01/10/20 0430 01/11/20 0306 01/21/20 0000 03/12/20 0000 04/10/20 0000  WBC   < > 15.8*   < > 12.6*   < > 15.1*   < > 12.1* 9.0 7.2 7.9  NEUTROABS  --  12.2*  --   --   --   --   --   --   --  3,902 4,487  HGB   < >  8.4*   < > 6.9*   < > 9.9*   < > 10.8* 11.5* 11.0* 11.2*  HCT   < > 27.4*   < > 22.8*   < > 29.8*   < > 34.0* 35* 34* 34*  MCV  --  97.5   < > 98.7  --  87.9  --  91.2  --   --   --   PLT   < > 260   < > 207   < > 217   < > 266 389 234 238   < > = values in this interval not displayed.   Lab Results  Component Value Date   TSH 1.58 03/12/2020   Lab Results  Component Value Date   HGBA1C 5.3 11/01/2016   No results found for: CHOL, HDL, LDLCALC, LDLDIRECT, TRIG, CHOLHDL  Significant Diagnostic Results in last 30 days:  No results found.  Assessment/Plan Rectocele d/w the Nurses that patient has h/o Rectocele  External hemorrhoids Anusol BID for 7 days  Bilateral edema of lower extremity On Lasix and Potassium Will decrease potassium to 30 meq Repeat BMP in 1 week Mixed Alzheimer's and vascular dementia ([place]) Continues to have issues with  behaviors On Risperdal,Klonipin Depakote and Namenda Essential hypertension Lisinopril and Lasix Hypothyroidism, unspecified type TSH normal in 8/21  Anxiety and depression Cymbalta and Klonipin   Family/ staff Communication:   Labs/tests ordered:  BMP in 1 week

## 2020-06-01 ENCOUNTER — Non-Acute Institutional Stay (SKILLED_NURSING_FACILITY): Payer: Medicare Other | Admitting: Internal Medicine

## 2020-06-01 ENCOUNTER — Encounter: Payer: Self-pay | Admitting: Internal Medicine

## 2020-06-01 DIAGNOSIS — I10 Essential (primary) hypertension: Secondary | ICD-10-CM

## 2020-06-01 DIAGNOSIS — F32A Depression, unspecified: Secondary | ICD-10-CM | POA: Diagnosis not present

## 2020-06-01 DIAGNOSIS — F419 Anxiety disorder, unspecified: Secondary | ICD-10-CM

## 2020-06-01 DIAGNOSIS — F0280 Dementia in other diseases classified elsewhere without behavioral disturbance: Secondary | ICD-10-CM

## 2020-06-01 DIAGNOSIS — F0150 Vascular dementia without behavioral disturbance: Secondary | ICD-10-CM | POA: Diagnosis not present

## 2020-06-01 DIAGNOSIS — R600 Localized edema: Secondary | ICD-10-CM | POA: Diagnosis not present

## 2020-06-01 DIAGNOSIS — G309 Alzheimer's disease, unspecified: Secondary | ICD-10-CM | POA: Diagnosis not present

## 2020-06-01 NOTE — Progress Notes (Signed)
Location:   [facility] Room Number: 42 Place of Service:  SNF [phone]) Provider:  Veleta Miners MD  [name], MD  Patient Care Team: [name], MD as PCP - General (Internal Medicine) Mast, Man X, NP as Nurse Practitioner (Internal Medicine) [name], MD (Internal Medicine)  Extended Emergency Contact Information Primary Emergency Contact: [facility] Address: [address]          [place], [place] [address] [name] of [place] Phone: [phone] Relation: Son Secondary Emergency Contact: [facility] Address: [address] [place] [address] [name] Phone: [phone] Relation: Daughter  Code Status:  DNR Goals of care: Advanced Directive information Advanced Directives 05/14/2020  Does Patient Have a Medical Advance Directive? Yes  Type of Paramedic of [place];Out of facility DNR (pink MOST or yellow form);Living will  Does patient want to make changes to medical advance directive? No - Patient declined  Copy of [place] in Chart? Yes - validated most recent copy scanned in chart (See row information)  Would patient like information on creating a medical advance directive? -  Pre-existing out of facility DNR order (yellow form or pink MOST form) Yellow form placed in chart (order not valid for inpatient use)     Chief Complaint  Patient presents with   Acute Visit    Behaviors follow up    HPI:  Pt is a 86 y.o. female seen today for an acute visit for Continue Behavior issues  [place] termcare resident Patienthas h/o Hypertension, Hypothyroidism, Alzheimer's Dementia with behavior issues, Depression, B12 def, S/P Right Hip fracture with Intramedullary Fixation in 8/20 H/O Colitis in 06/21 and Right Tibia Fibula Fracture in 5/21 due to fall S/P IM Placement  Continues to have behavior issues. Gets very anxious Today she is  saying that she has to go out to see her friend.  Continues to have falls as she tries to stand up without the support Not sleeping well at night   Past Medical History:  Diagnosis Date   Alzheimer disease ([place]) 10/27/2016   Arthritis    Confusion 04/04/2019   Depression, recurrent ([place]) 10/27/2016   History of fracture of right hip 04/04/2019   07/01/19 Ortho, R hip incision healed, X-ray looks good, s/p IMN R hip fx, WBAT, f/u 6 mos.    HTN (hypertension) 10/27/2016   Hypertension    Hypertensive kidney disease with CKD (chronic kidney disease) stage V ([place]) 11/02/2016   11/02/16 Na 136, K 4.7, Bun 30, creat 1.52, TSH 0.86, wbc 6.7, Hgb 11.3, plt 264   Hypothyroidism 10/27/2016   Osteoarthritis 10/27/2016   Thyroid disease    Past Surgical History:  Procedure Laterality Date   ABDOMINAL HYSTERECTOMY  1986   INTRAMEDULLARY (IM) NAIL INTERTROCHANTERIC Right 04/04/2019   Procedure: INTRAMEDULLARY (IM) NAIL INTERTROCHANTRIC;  Surgeon: Rod Can, MD;  Location: [facility];  Service: Orthopedics;  Laterality: Right;   KNEE ARTHROSCOPY  2015/2017   Dr. Gustavus Bryant   SPINE SURGERY  2009, 2011   Dr.Mark Nicoletta Dress   TIBIA IM NAIL INSERTION Right 01/01/2020   Procedure: INTRAMEDULLARY (IM) NAIL TIBIAL;  Surgeon: Shona Needles, MD;  Location: [facility];  Service: Orthopedics;  Laterality: Right;    No Known Allergies  Allergies as of 06/01/2020   No Known Allergies     Medication List       Accurate as of June 01, 2020 11:59 PM. If you have any questions, ask your nurse or doctor.        acetaminophen 325 MG tablet Commonly known as: TYLENOL Take 650 mg by mouth 2 (two) times daily.   ascorbic acid 500 MG tablet Commonly known as: VITAMIN C Take 500 mg by mouth daily.   aspirin EC 81 MG tablet Take 81 mg by mouth daily.   CALCIUM 600 PO Take 1 tablet by mouth daily.   cholecalciferol 25 MCG (1000 UNIT) tablet Commonly known as: VITAMIN D3 Take 2,000 Units by  mouth daily.   clonazePAM 0.5 MG tablet Commonly known as: KLONOPIN Take 0.5 mg by mouth 2 (two) times daily as needed for anxiety (Q6 prn).   clonazePAM 0.5 MG tablet Commonly known as: KLONOPIN Take 0.25 mg by mouth every 6 (six) hours as needed for anxiety.   clotrimazole-betamethasone cream Commonly known as: LOTRISONE Apply 1 application topically 2 (two) times daily.   divalproex 125 MG DR tablet Commonly known as: DEPAKOTE Take 250 mg by mouth in the morning and at bedtime.   docusate sodium 100 MG capsule Commonly known as: COLACE Take 1 capsule (100 mg total) by mouth 2 (two) times daily.   DULoxetine 30 MG capsule Commonly known as: CYMBALTA Take 30 mg by mouth daily.   furosemide 20 MG tablet Commonly known as: LASIX Take 40 mg by mouth daily.   levothyroxine 25 MCG tablet Commonly known as: SYNTHROID Take 25 mcg by mouth daily before breakfast.   lisinopril 10 MG tablet Commonly known as: ZESTRIL Take 10 mg by mouth daily.   memantine 10 MG tablet Commonly known as: NAMENDA Take 10 mg by mouth 2 (two) times daily.   ondansetron 4 MG disintegrating tablet Commonly known as: ZOFRAN-ODT Take 4 mg by mouth every 8 (eight) hours as needed for nausea or vomiting.   potassium chloride 10 MEQ CR capsule Commonly known as: MICRO-K Take 10 mEq by mouth daily.   potassium chloride SA 20 MEQ tablet Commonly known as: KLOR-CON Take 20 mEq by mouth daily.   risperiDONE 2 MG tablet Commonly known as: RISPERDAL Take 2 mg by mouth at bedtime.   traMADol 50 MG tablet Commonly known as: ULTRAM Take 0.5 tablets (25 mg total) by mouth every 6 (six) hours as needed.   vitamin B-12 1000 MCG tablet Commonly known as: CYANOCOBALAMIN Take 1,000 mcg by mouth. Once a day on Monday and Wednesday       Review of Systems  Unable to perform ROS: Dementia    Immunization History  Administered Date(s) Administered   Influenza-Unspecified 05/29/2017   Moderna  SARS-COVID-2 Vaccination 09/07/2019, 10/05/2019   Pneumococcal Conjugate-13 06/21/2017   Pneumococcal Polysaccharide-23 07/16/2018   Tdap 06/21/2017   Pertinent  Health Maintenance Due  Topic Date Due   DEXA SCAN  Completed   PNA vac Low Risk Adult  Completed   INFLUENZA VACCINE  Discontinued   Fall Risk  04/27/2018 04/25/2017  Falls in the past year? No No   Functional Status Survey:    Vitals:   06/01/20 1355  BP: 140/80  Pulse: 80  Resp: 18  Temp: 98.9 F (37.2 C)  SpO2: 95%  Weight: 152 lb 14.4 oz (69.4 kg)  Height: 4\' 11"  (1.499 m)   Body mass index is 30.88 kg/m. Physical Exam Vitals reviewed.  Constitutional:      Appearance: Normal appearance.  HENT:     Head: Normocephalic.     Nose: Nose normal.  Mouth/Throat:     Mouth: Mucous membranes are moist.     Pharynx: Oropharynx is clear.  Eyes:     Pupils: Pupils are equal, round, and reactive to light.  Cardiovascular:     Rate and Rhythm: Normal rate and regular rhythm.     Pulses: Normal pulses.  Pulmonary:     Effort: Pulmonary effort is normal.     Breath sounds: Normal breath sounds.  Abdominal:     General: Abdomen is flat. Bowel sounds are normal.     Palpations: Abdomen is soft.  Musculoskeletal:        General: Swelling present.     Cervical back: Neck supple.  Skin:    General: Skin is warm.  Neurological:     General: No focal deficit present.     Mental Status: She is alert.  Psychiatric:        Mood and Affect: Mood normal.     Labs reviewed: Recent Labs    01/09/20 0425 01/09/20 0425 01/10/20 0430 01/10/20 0430 01/11/20 0306 01/15/20 0000 03/12/20 0000 04/10/20 0000 05/15/20 0000  NA 140   < > 140   < > 140   < > 139 141 142  K 3.8   < > 3.8   < > 3.6   < > 4.6 4.2 5.1  CL 108   < > 107   < > 110   < > 105 105 107  CO2 23   < > 23   < > 21*   < > 30* 26* 25*  GLUCOSE 103*  --  90  --  88  --   --   --   --   BUN 27*   < > 19   < > 14   < > 20 24* 33*    CREATININE 0.98   < > 0.85   < > 0.74   < > 1.2* 1.0 1.3*  CALCIUM 8.8*   < > 8.6*   < > 8.6*   < > 10.1 10.4 10.5   < > = values in this interval not displayed.   Recent Labs    01/08/20 1448 01/08/20 1448 01/09/20 0425 01/09/20 0425 01/11/20 0306 03/12/20 0000 04/10/20 0000  AST 14*   < > 11*   < > 11* 9* 10*  ALT 10   < > 9   < > 9 6* 7  ALKPHOS 58   < > 49   < > 62 107 98  BILITOT 0.7  --  0.6  --  0.8  --   --   PROT 5.9*  --  5.4*  --  5.4*  --   --   ALBUMIN 2.6*   < > 2.5*   < > 2.5* 3.5 3.8   < > = values in this interval not displayed.   Recent Labs    01/07/20 0000 01/08/20 1448 01/08/20 2035 01/09/20 0425 01/09/20 0425 01/10/20 0430 01/10/20 0430 01/11/20 0306 01/21/20 0000 03/12/20 0000 04/10/20 0000  WBC   < > 15.8*   < > 12.6*   < > 15.1*   < > 12.1* 9.0 7.2 7.9  NEUTROABS  --  12.2*  --   --   --   --   --   --   --  3,902 4,487  HGB   < > 8.4*   < > 6.9*   < > 9.9*   < > 10.8* 11.5* 11.0* 11.2*  HCT   < >  27.4*   < > 22.8*   < > 29.8*   < > 34.0* 35* 34* 34*  MCV  --  97.5   < > 98.7  --  87.9  --  91.2  --   --   --   PLT   < > 260   < > 207   < > 217   < > 266 389 234 238   < > = values in this interval not displayed.   Lab Results  Component Value Date   TSH 1.58 03/12/2020   Lab Results  Component Value Date   HGBA1C 5.3 11/01/2016   No results found for: CHOL, HDL, LDLCALC, LDLDIRECT, TRIG, CHOLHDL  Significant Diagnostic Results in last 30 days:  No results found.  Assessment/Plan Bilateral edema of lower extremity Edema better on Lasix BUN and creat stable Mixed Alzheimer's and vascular dementia with behavior issues On Risperdal,Depakote,namenda Still having Behavior issues Will increase her klonopin to 0.5 mg Q6 PRN  Anxiety and depression Increase the klonopin to 0.5 mg  Also on Cymbalta Essential hypertension Lisinopril and Lasix     Family/ staff Communication:   Labs/tests ordered:

## 2020-06-02 DIAGNOSIS — E876 Hypokalemia: Secondary | ICD-10-CM | POA: Diagnosis not present

## 2020-06-03 LAB — BASIC METABOLIC PANEL
BUN: 25 — AB (ref 4–21)
CO2: 28 — AB (ref 13–22)
Chloride: 102 (ref 99–108)
Creatinine: 1.3 — AB (ref 0.5–1.1)
Glucose: 90
Potassium: 4.9 (ref 3.4–5.3)
Sodium: 139 (ref 137–147)

## 2020-06-03 LAB — COMPREHENSIVE METABOLIC PANEL: Calcium: 10.5 (ref 8.7–10.7)

## 2020-06-10 ENCOUNTER — Encounter: Payer: Self-pay | Admitting: Nurse Practitioner

## 2020-06-10 ENCOUNTER — Non-Acute Institutional Stay (SKILLED_NURSING_FACILITY): Payer: Medicare Other | Admitting: Nurse Practitioner

## 2020-06-10 DIAGNOSIS — R609 Edema, unspecified: Secondary | ICD-10-CM

## 2020-06-10 DIAGNOSIS — F419 Anxiety disorder, unspecified: Secondary | ICD-10-CM | POA: Diagnosis not present

## 2020-06-10 DIAGNOSIS — F32A Depression, unspecified: Secondary | ICD-10-CM | POA: Diagnosis not present

## 2020-06-10 DIAGNOSIS — E039 Hypothyroidism, unspecified: Secondary | ICD-10-CM

## 2020-06-10 DIAGNOSIS — I10 Essential (primary) hypertension: Secondary | ICD-10-CM | POA: Diagnosis not present

## 2020-06-10 DIAGNOSIS — M159 Polyosteoarthritis, unspecified: Secondary | ICD-10-CM | POA: Diagnosis not present

## 2020-06-10 NOTE — Assessment & Plan Note (Signed)
Minimal BLE, continue Furosemide.

## 2020-06-10 NOTE — Progress Notes (Signed)
Location:   SNF [place] Room Number: 91 A Place of Service:  SNF (31) Provider: [facility] Shataya Winkles NP  [name], MD  Patient Care Team: [name], MD as PCP - General (Internal Medicine) Lou Loewe X, NP as Nurse Practitioner (Internal Medicine) [name], MD (Internal Medicine)  Extended Emergency Contact Information Primary Emergency Contact: [facility] Address: [address]          [place], [place] [address] [name] of [place] Phone: [phone] Relation: Son Secondary Emergency Contact: [facility] Address: [address] [place] [address] [name] Phone: [phone] Relation: Daughter  Code Status:  DNR Goals of care: Advanced Directive information Advanced Directives 06/10/2020  Does Patient Have a Medical Advance Directive? Yes  Type of Paramedic of [place];Living will;Out of facility DNR (pink MOST or yellow form)  Does patient want to make changes to medical advance directive? No - Patient declined  Copy of [place] in Chart? Yes - validated most recent copy scanned in chart (See row information)  Would patient like information on creating a medical advance directive? -  Pre-existing out of facility DNR order (yellow form or pink MOST form) Yellow form placed in chart (order not valid for inpatient use)     Chief Complaint  Patient presents with  . Medical Management of Chronic Issues    Routine follow up visit.    HPI:  Pt is a 86 y.o. female seen today for medical management of chronic diseases.     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              HTN, takes Lisinopril, Furosemide.              Edema BLE, takes Furosemide.              Hypothyroidism, TSH wnl 03/2020, takes Levothyroxine.              OA, in general,  takes Tylenol.      Past Medical History:  Diagnosis Date  . Alzheimer disease ([place]) 10/27/2016  . Arthritis   . Confusion 04/04/2019  . Depression, recurrent ([place]) 10/27/2016  . History of fracture of right hip 04/04/2019   07/01/19 Ortho, R hip incision healed, X-ray looks good, s/p IMN R hip fx, WBAT, f/u 6 mos.   Marland Kitchen HTN (hypertension) 10/27/2016  . Hypertension   . Hypertensive kidney disease with CKD (chronic kidney disease) stage V ([place]) 11/02/2016   11/02/16 Na 136, K 4.7, Bun 30, creat 1.52, TSH 0.86, wbc 6.7, Hgb 11.3, plt 264  . Hypothyroidism 10/27/2016  . Osteoarthritis 10/27/2016  . Thyroid disease    Past Surgical History:  Procedure Laterality Date  . ABDOMINAL HYSTERECTOMY  1986  . INTRAMEDULLARY (IM) NAIL INTERTROCHANTERIC Right 04/04/2019   Procedure: INTRAMEDULLARY (IM) NAIL INTERTROCHANTRIC;  Surgeon: Rod Can, MD;  Location: [facility];  Service: Orthopedics;  Laterality: Right;  . KNEE ARTHROSCOPY  2015/2017   Dr. Gustavus Bryant  . SPINE SURGERY  2009, 2011   Dr.Mark Nicoletta Dress  . TIBIA IM NAIL INSERTION Right 01/01/2020   Procedure: INTRAMEDULLARY (IM) NAIL TIBIAL;  Surgeon: Shona Needles, MD;  Location: [facility];  Service: Orthopedics;  Laterality: Right;    No Known Allergies  Allergies as of 06/10/2020   No Known Allergies     Medication List       Accurate as of June 10, 2020 11:59 PM. If you have any questions, ask your nurse or doctor.        acetaminophen 325 MG tablet Commonly known as: TYLENOL Take 650 mg by mouth 2 (two) times daily.   ascorbic acid 500 MG tablet Commonly known as: VITAMIN C Take 500 mg by mouth daily.   aspirin EC 81 MG tablet Take 81 mg by mouth daily.   CALCIUM 600 PO Take 1 tablet by mouth daily.   cholecalciferol 25 MCG (1000 UNIT) tablet Commonly known as: VITAMIN D3 Take 2,000 Units by mouth daily.   clonazePAM 0.5 MG tablet Commonly known as: KLONOPIN Take 0.5 mg by mouth 2 (two) times daily as needed for  anxiety (Q6 prn).   clotrimazole-betamethasone cream Commonly known as: LOTRISONE Apply 1 application topically 2 (two) times daily.   divalproex 125 MG DR tablet Commonly known as: DEPAKOTE Take 250 mg by mouth in the morning and at bedtime.   docusate sodium 100 MG capsule Commonly known as: COLACE Take 1 capsule (100 mg total) by mouth 2 (two) times daily.   DULoxetine 30 MG capsule Commonly known as: CYMBALTA Take 30 mg by mouth daily.   furosemide 20 MG tablet Commonly known as: LASIX Take 40 mg by mouth daily.   levothyroxine 25 MCG tablet Commonly known as: SYNTHROID Take 25 mcg by mouth daily before breakfast.   lisinopril 10 MG tablet Commonly known as: ZESTRIL Take 10 mg by mouth daily.   memantine 10 MG tablet Commonly known as: NAMENDA Take 10 mg by mouth 2 (two) times daily.   ondansetron 4 MG disintegrating tablet Commonly known as: ZOFRAN-ODT Take 4 mg by mouth every 8 (eight) hours as needed for nausea or vomiting.   potassium chloride 10 MEQ CR capsule Commonly known as: MICRO-K Take 10 mEq by mouth daily.   potassium chloride SA 20 MEQ tablet Commonly known as: KLOR-CON Take 20 mEq by mouth daily.   risperiDONE 2 MG tablet Commonly known as: RISPERDAL Take 2 mg by mouth at bedtime.   traMADol 50 MG tablet Commonly known as: ULTRAM Take 0.5 tablets (25 mg total) by mouth every 6 (six) hours as needed.   vitamin B-12 1000 MCG tablet Commonly known as: CYANOCOBALAMIN Take 1,000 mcg by mouth. Once a day on Monday and Wednesday       Review of Systems  Constitutional: Negative for activity change, appetite change and fever.       Sleepy upon my visit today, staff reported the patient not sleeping well at night.   HENT: Positive for hearing loss. Negative for congestion and voice change.   Eyes: Negative for visual disturbance.  Respiratory: Negative for cough and shortness of breath.   Cardiovascular: Positive for leg swelling.    Gastrointestinal: Negative for abdominal pain and diarrhea.  Genitourinary: Negative for dysuria, frequency and urgency.  Musculoskeletal: Positive for arthralgias and gait problem.       S/p R lower leg IM tibia  Skin: Negative for color change.  Neurological: Negative for speech difficulty, weakness, light-headedness and headaches.       Dementia, lethargic.   Psychiatric/Behavioral: Positive for agitation, behavioral problems, confusion and sleep disturbance. The patient is nervous/anxious.        Improving.     Immunization History  Administered Date(s) Administered  . Influenza-Unspecified 05/29/2017, 05/20/2020  .  Moderna SARS-COVID-2 Vaccination 09/07/2019, 10/05/2019  . Pneumococcal Conjugate-13 06/21/2017  . Pneumococcal Polysaccharide-23 07/16/2018  . Tdap 06/21/2017   Pertinent  Health Maintenance Due  Topic Date Due  . DEXA SCAN  Completed  . PNA vac Low Risk Adult  Completed  . INFLUENZA VACCINE  Discontinued   Fall Risk  04/27/2018 04/25/2017  Falls in the past year? No No   Functional Status Survey:    Vitals:   06/10/20 1339  BP: 116/62  Pulse: 79  Resp: 18  Temp: 98.5 F (36.9 C)  SpO2: 95%  Weight: 153 lb 14.4 oz (69.8 kg)  Height: 4\' 11"  (1.499 m)   Body mass index is 31.08 kg/m. Physical Exam Vitals and nursing note reviewed.  Constitutional:      Appearance: Normal appearance.  HENT:     Head: Normocephalic and atraumatic.     Mouth/Throat:     Mouth: Mucous membranes are moist.  Eyes:     Conjunctiva/sclera: Conjunctivae normal.     Pupils: Pupils are equal, round, and reactive to light.  Cardiovascular:     Rate and Rhythm: Normal rate and regular rhythm.     Heart sounds: No murmur heard.   Pulmonary:     Effort: Pulmonary effort is normal.     Breath sounds: No rales.  Abdominal:     General: Bowel sounds are normal.     Palpations: Abdomen is soft.     Tenderness: There is no abdominal tenderness.  Musculoskeletal:      Cervical back: Normal range of motion and neck supple.     Right lower leg: Edema present.     Left lower leg: Edema present.     Comments: R s/p IM rod for tibia fx. Minimal  edema BLE  Skin:    General: Skin is warm and dry.  Neurological:     General: No focal deficit present.     Mental Status: She is alert.     Coordination: Coordination normal.     Gait: Gait abnormal.  Psychiatric:        Mood and Affect: Mood normal.     Comments: Followed simple directions during today's examination, but confused.      Labs reviewed: Recent Labs    01/09/20 0425 01/09/20 0425 01/10/20 0430 01/10/20 0430 01/11/20 0306 01/15/20 0000 03/12/20 0000 04/10/20 0000 05/15/20 0000  NA 140   < > 140   < > 140   < > 139 141 142  K 3.8   < > 3.8   < > 3.6   < > 4.6 4.2 5.1  CL 108   < > 107   < > 110   < > 105 105 107  CO2 23   < > 23   < > 21*   < > 30* 26* 25*  GLUCOSE 103*  --  90  --  88  --   --   --   --   BUN 27*   < > 19   < > 14   < > 20 24* 33*  CREATININE 0.98   < > 0.85   < > 0.74   < > 1.2* 1.0 1.3*  CALCIUM 8.8*   < > 8.6*   < > 8.6*   < > 10.1 10.4 10.5   < > = values in this interval not displayed.   Recent Labs    01/08/20 1448 01/08/20 1448 01/09/20 0425 01/09/20 0425 01/11/20 0306 03/12/20 0000 04/10/20  0000  AST 14*   < > 11*   < > 11* 9* 10*  ALT 10   < > 9   < > 9 6* 7  ALKPHOS 58   < > 49   < > 62 107 98  BILITOT 0.7  --  0.6  --  0.8  --   --   PROT 5.9*  --  5.4*  --  5.4*  --   --   ALBUMIN 2.6*   < > 2.5*   < > 2.5* 3.5 3.8   < > = values in this interval not displayed.   Recent Labs    01/07/20 0000 01/08/20 1448 01/08/20 2035 01/09/20 0425 01/09/20 0425 01/10/20 0430 01/10/20 0430 01/11/20 0306 01/21/20 0000 03/12/20 0000 04/10/20 0000  WBC   < > 15.8*   < > 12.6*   < > 15.1*   < > 12.1* 9.0 7.2 7.9  NEUTROABS  --  12.2*  --   --   --   --   --   --   --  3,902 4,487  HGB   < > 8.4*   < > 6.9*   < > 9.9*   < > 10.8* 11.5* 11.0* 11.2*    HCT   < > 27.4*   < > 22.8*   < > 29.8*   < > 34.0* 35* 34* 34*  MCV  --  97.5   < > 98.7  --  87.9  --  91.2  --   --   --   PLT   < > 260   < > 207   < > 217   < > 266 389 234 238   < > = values in this interval not displayed.   Lab Results  Component Value Date   TSH 1.58 03/12/2020   Lab Results  Component Value Date   HGBA1C 5.3 11/01/2016   No results found for: CHOL, HDL, LDLCALC, LDLDIRECT, TRIG, CHOLHDL  Significant Diagnostic Results in last 30 days:  No results found.  Assessment/Plan  Anxiety and depression Depression/anxiety, improving, takes Risperdal 2mg  qhs, continue Clonazepam, Depakote, Duloxetine.    Essential hypertension Blood pressure is controlled, continue Lisinopril, Furosemide.   Edema Minimal BLE, continue Furosemide.   Hypothyroidism TSH wnl 03/2020, continue Levothyroxine.   Osteoarthritis Ambulates with walker with assistance, continue Tylenol.    Family/ staff Communication: plan of care reviewed with the patient and charge nurse.   Labs/tests ordered:  none  Time spend 35 minutes.

## 2020-06-10 NOTE — Assessment & Plan Note (Signed)
Ambulates with walker with assistance, continue Tylenol.

## 2020-06-10 NOTE — Assessment & Plan Note (Signed)
Depression/anxiety, improving, takes Risperdal 2mg  qhs, continue Clonazepam, Depakote, Duloxetine.

## 2020-06-10 NOTE — Assessment & Plan Note (Signed)
TSH wnl 03/2020, continue Levothyroxine.

## 2020-06-10 NOTE — Assessment & Plan Note (Signed)
Blood pressure is controlled, continue Lisinopril, Furosemide.

## 2020-06-11 ENCOUNTER — Encounter: Payer: Self-pay | Admitting: Nurse Practitioner

## 2020-06-19 ENCOUNTER — Other Ambulatory Visit: Payer: Self-pay | Admitting: *Deleted

## 2020-06-19 NOTE — Telephone Encounter (Signed)
Received refill request from [facility] and sent to Dr. Lyndel Safe for approval.

## 2020-06-22 MED ORDER — CLONAZEPAM 0.5 MG PO TABS: 0.5000 mg | ORAL_TABLET | Freq: Two times a day (BID) | ORAL | 0 refills | Status: DC | PRN

## 2020-06-23 DIAGNOSIS — S82201D Unspecified fracture of shaft of right tibia, subsequent encounter for closed fracture with routine healing: Secondary | ICD-10-CM | POA: Diagnosis not present

## 2020-07-07 ENCOUNTER — Other Ambulatory Visit: Payer: Self-pay

## 2020-07-07 MED ORDER — CLONAZEPAM 0.5 MG PO TABS: 0.5000 mg | ORAL_TABLET | Freq: Two times a day (BID) | ORAL | 0 refills | Status: DC | PRN

## 2020-07-08 ENCOUNTER — Encounter: Payer: Self-pay | Admitting: Nurse Practitioner

## 2020-07-08 ENCOUNTER — Non-Acute Institutional Stay (SKILLED_NURSING_FACILITY): Payer: Medicare Other | Admitting: Nurse Practitioner

## 2020-07-08 DIAGNOSIS — M159 Polyosteoarthritis, unspecified: Secondary | ICD-10-CM

## 2020-07-08 DIAGNOSIS — F0280 Dementia in other diseases classified elsewhere without behavioral disturbance: Secondary | ICD-10-CM

## 2020-07-08 DIAGNOSIS — R609 Edema, unspecified: Secondary | ICD-10-CM

## 2020-07-08 DIAGNOSIS — F320 Major depressive disorder, single episode, mild: Secondary | ICD-10-CM | POA: Diagnosis not present

## 2020-07-08 DIAGNOSIS — G309 Alzheimer's disease, unspecified: Secondary | ICD-10-CM | POA: Diagnosis not present

## 2020-07-08 DIAGNOSIS — F0150 Vascular dementia without behavioral disturbance: Secondary | ICD-10-CM

## 2020-07-08 DIAGNOSIS — E039 Hypothyroidism, unspecified: Secondary | ICD-10-CM

## 2020-07-08 DIAGNOSIS — I10 Essential (primary) hypertension: Secondary | ICD-10-CM

## 2020-07-08 NOTE — Assessment & Plan Note (Signed)
OA, in general, takes Tylenol.

## 2020-07-08 NOTE — Progress Notes (Signed)
Location:   [facility] Room Number: 42 Place of Service:  SNF (31) Provider:  Sheri Prows, Lennie Odor NP  [name], MD  Patient Care Team: [name], MD as PCP - General (Internal Medicine) Lindel Marcell X, NP as Nurse Practitioner (Internal Medicine) [name], MD (Internal Medicine)  Extended Emergency Contact Information Primary Emergency Contact: [facility] Address: [address]          [place], [place] [address] [name] of [place] Phone: [phone] Relation: Son Secondary Emergency Contact: [facility] Address: [address] [place] [place] [address] [name] Phone: [phone] Relation: Daughter  Code Status:  DNR Goals of care: Advanced Directive information Advanced Directives 06/10/2020  Does Patient Have a Medical Advance Directive? Yes  Type of Paramedic of [place];Living will;Out of facility DNR (pink MOST or yellow form)  Does patient want to make changes to medical advance directive? No - Patient declined  Copy of [place] in Chart? Yes - validated most recent copy scanned in chart (See row information)  Would patient like information on creating a medical advance directive? -  Pre-existing out of facility DNR order (yellow form or pink MOST form) Yellow form placed in chart (order not valid for inpatient use)     Chief Complaint  Patient presents with   Medical Management of Chronic Issues    HPI:  Pt is a 86 y.o. female seen today for medical management of chronic diseases.    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  HTN, takes Lisinopril, Furosemide.  Edema BLE, takes Furosemide.  Hypothyroidism, TSH wnl 03/2020, takes Levothyroxine.  OA, in general, takes Tylenol.     Past Medical History:  Diagnosis Date   Alzheimer disease ([place]) 10/27/2016   Arthritis    Confusion 04/04/2019   Depression, recurrent ([place]) 10/27/2016   History of fracture of right hip 04/04/2019   07/01/19 Ortho, R hip incision healed, X-ray looks good, s/p IMN R hip fx, WBAT, f/u 6 mos.    HTN (hypertension) 10/27/2016   Hypertension    Hypertensive kidney disease with CKD (chronic kidney disease) stage V ([place]) 11/02/2016   11/02/16 Na 136, K 4.7, Bun 30, creat 1.52, TSH 0.86, wbc 6.7, Hgb 11.3, plt 264   Hypothyroidism 10/27/2016   Osteoarthritis 10/27/2016   Thyroid disease    Past Surgical History:  Procedure Laterality Date   ABDOMINAL HYSTERECTOMY  1986   INTRAMEDULLARY (IM) NAIL INTERTROCHANTERIC Right 04/04/2019   Procedure: INTRAMEDULLARY (IM) NAIL INTERTROCHANTRIC;  Surgeon: Rod Can, MD;  [facility];  Service: Orthopedics;  Laterality: Right;   KNEE ARTHROSCOPY  2015/2017   Dr. Gustavus Bryant   SPINE SURGERY  2009, 2011   Dr.Mark Nicoletta Dress   TIBIA IM NAIL INSERTION Right 01/01/2020   Procedure: INTRAMEDULLARY (IM) NAIL TIBIAL;  Surgeon: Shona Needles, MD;  Location: [facility];  Service: Orthopedics;  Laterality: Right;    No Known Allergies  Allergies as of 07/08/2020   No Known Allergies     Medication List       Accurate as of July 08, 2020  3:51 PM. If you have any questions, ask your nurse or doctor.        STOP taking these medications   CALCIUM 600 PO Stopped by: Hart Haas X Chukwudi Ewen, NP   clonazePAM 0.5 MG tablet  Commonly known as: KLONOPIN Stopped by: Emir Nack X Melonee Gerstel, NP     TAKE these medications   acetaminophen 325 MG tablet Commonly known as: TYLENOL Take 650 mg by mouth 2 (two) times daily.   ascorbic acid 500 MG tablet Commonly known as: VITAMIN C Take 500 mg by mouth daily.   aspirin EC 81 MG tablet Take 81 mg by mouth daily.   cholecalciferol 25 MCG (1000 UNIT) tablet Commonly known as: VITAMIN D3 Take 2,000 Units by mouth  daily.   clotrimazole-betamethasone cream Commonly known as: LOTRISONE Apply 1 application topically 2 (two) times daily.   divalproex 125 MG DR tablet Commonly known as: DEPAKOTE Take 250 mg by mouth in the morning and at bedtime.   docusate sodium 100 MG capsule Commonly known as: COLACE Take 1 capsule (100 mg total) by mouth 2 (two) times daily.   DULoxetine 30 MG capsule Commonly known as: CYMBALTA Take 30 mg by mouth daily.   furosemide 20 MG tablet Commonly known as: LASIX Take 40 mg by mouth daily.   levothyroxine 25 MCG tablet Commonly known as: SYNTHROID Take 25 mcg by mouth daily before breakfast.   lisinopril 10 MG tablet Commonly known as: ZESTRIL Take 10 mg by mouth daily.   memantine 10 MG tablet Commonly known as: NAMENDA Take 10 mg by mouth 2 (two) times daily.   ondansetron 4 MG disintegrating tablet Commonly known as: ZOFRAN-ODT Take 4 mg by mouth every 8 (eight) hours as needed for nausea or vomiting.   potassium chloride 10 MEQ CR capsule Commonly known as: MICRO-K Take 10 mEq by mouth daily.   potassium chloride SA 20 MEQ tablet Commonly known as: KLOR-CON Take 20 mEq by mouth daily.   risperiDONE 2 MG tablet Commonly known as: RISPERDAL Take 2 mg by mouth at bedtime.   traMADol 50 MG tablet Commonly known as: ULTRAM Take 0.5 tablets (25 mg total) by mouth every 6 (six) hours as needed.   vitamin B-12 1000 MCG tablet Commonly known as: CYANOCOBALAMIN Take 1,000 mcg by mouth. Once a day on Monday and Wednesday       Review of Systems  Constitutional: Negative for activity change, fever and unexpected weight change.       Sleepy upon my visit today, staff reported the patient not sleeping well at night.   HENT: Positive for hearing loss. Negative for congestion and voice change.   Eyes: Negative for visual disturbance.  Respiratory: Negative for cough and shortness of breath.   Cardiovascular: Positive for leg swelling.   Gastrointestinal: Negative for abdominal pain and diarrhea.  Genitourinary: Negative for dysuria, frequency and urgency.  Musculoskeletal: Positive for arthralgias and gait problem.       S/p R lower leg IM tibia  Skin: Negative for color change.  Neurological: Negative for speech difficulty, weakness, light-headedness and headaches.       Dementia, lethargic.   Psychiatric/Behavioral: Positive for agitation, behavioral problems, confusion and sleep disturbance. The patient is nervous/anxious.        Improving.     Immunization History  Administered Date(s) Administered   Influenza-Unspecified 05/29/2017, 05/20/2020   Moderna SARS-COVID-2 Vaccination 09/07/2019, 10/05/2019   Pneumococcal Conjugate-13 06/21/2017   Pneumococcal Polysaccharide-23 07/16/2018   Tdap 06/21/2017   Pertinent  Health Maintenance Due  Topic Date Due   DEXA SCAN  Completed   PNA vac Low Risk Adult  Completed   INFLUENZA VACCINE  Discontinued   Fall Risk  04/27/2018 04/25/2017  Falls in the past  year? No No   Functional Status Survey:    Vitals:   07/08/20 0924  BP: 130/80  Pulse: 78  Resp: 18  Temp: 97.9 F (36.6 C)  SpO2: 93%  Weight: 153 lb 14.4 oz (69.8 kg)  Height: 4\' 11"  (1.499 m)   Body mass index is 31.08 kg/m. Physical Exam Vitals and nursing note reviewed.  Constitutional:      Appearance: Normal appearance.  HENT:     Head: Normocephalic and atraumatic.     Mouth/Throat:     Mouth: Mucous membranes are moist.  Eyes:     Conjunctiva/sclera: Conjunctivae normal.     Pupils: Pupils are equal, round, and reactive to light.  Cardiovascular:     Rate and Rhythm: Normal rate and regular rhythm.     Heart sounds: No murmur heard.      Comments: DP pulses present R+L Pulmonary:     Effort: Pulmonary effort is normal.     Breath sounds: No rales.  Abdominal:     General: Bowel sounds are normal.     Palpations: Abdomen is soft.     Tenderness: There is no abdominal  tenderness.  Musculoskeletal:     Cervical back: Normal range of motion and neck supple.     Right lower leg: Edema present.     Left lower leg: Edema present.     Comments: R s/p IM rod for tibia fx. !+ edema BLE  Skin:    General: Skin is warm and dry.  Neurological:     General: No focal deficit present.     Mental Status: She is alert.     Coordination: Coordination normal.     Gait: Gait abnormal.  Psychiatric:        Mood and Affect: Mood normal.     Comments: Followed simple directions during today's examination, but confused.      Labs reviewed: Recent Labs    01/09/20 0425 01/09/20 0425 01/10/20 0430 01/10/20 0430 01/11/20 0306 01/15/20 0000 04/10/20 0000 05/15/20 0000 06/03/20 0000  NA 140   < > 140   < > 140   < > 141 142 139  K 3.8   < > 3.8   < > 3.6   < > 4.2 5.1 4.9  CL 108   < > 107   < > 110   < > 105 107 102  CO2 23   < > 23   < > 21*   < > 26* 25* 28*  GLUCOSE 103*  --  90  --  88  --   --   --   --   BUN 27*   < > 19   < > 14   < > 24* 33* 25*  CREATININE 0.98   < > 0.85   < > 0.74   < > 1.0 1.3* 1.3*  CALCIUM 8.8*   < > 8.6*   < > 8.6*   < > 10.4 10.5 10.5   < > = values in this interval not displayed.   Recent Labs    01/08/20 1448 01/08/20 1448 01/09/20 0425 01/09/20 0425 01/11/20 0306 03/12/20 0000 04/10/20 0000  AST 14*   < > 11*   < > 11* 9* 10*  ALT 10   < > 9   < > 9 6* 7  ALKPHOS 58   < > 49   < > 62 107 98  BILITOT 0.7  --  0.6  --  0.8  --   --   PROT 5.9*  --  5.4*  --  5.4*  --   --   ALBUMIN 2.6*   < > 2.5*   < > 2.5* 3.5 3.8   < > = values in this interval not displayed.   Recent Labs    01/07/20 0000 01/08/20 1448 01/08/20 2035 01/09/20 0425 01/09/20 0425 01/10/20 0430 01/10/20 0430 01/11/20 0306 01/21/20 0000 03/12/20 0000 04/10/20 0000  WBC   < > 15.8*   < > 12.6*   < > 15.1*   < > 12.1* 9.0 7.2 7.9  NEUTROABS  --  12.2*  --   --   --   --   --   --   --  3,902 4,487  HGB   < > 8.4*   < > 6.9*   < > 9.9*    < > 10.8* 11.5* 11.0* 11.2*  HCT   < > 27.4*   < > 22.8*   < > 29.8*   < > 34.0* 35* 34* 34*  MCV  --  97.5   < > 98.7  --  87.9  --  91.2  --   --   --   PLT   < > 260   < > 207   < > 217   < > 266 389 234 238   < > = values in this interval not displayed.   Lab Results  Component Value Date   TSH 1.58 03/12/2020   Lab Results  Component Value Date   HGBA1C 5.3 11/01/2016   No results found for: CHOL, HDL, LDLCALC, LDLDIRECT, TRIG, CHOLHDL  Significant Diagnostic Results in last 30 days:  No results found.  Assessment/Plan Mixed Alzheimer's and vascular dementia ([place]) Dementia, impulsive, no safety awareness, takes Memantine, resides in SNF [facility]  Depression Depression/anxiety: takes Risperdal 2mg  qhs, Clonazepam, Depakote, Duloxetine.   Essential hypertension HTN, takes Lisinopril, Furosemide.   Edema Edema BLE 1+, DP pulses present, takes Furosemide.   Hypothyroidism Hypothyroidism, TSH wnl 03/2020, takes Levothyroxine.   Osteoarthritis OA, in general, takes Tylenol.       Family/ staff Communication: plan of care reviewed with the patient and charge nurse.   Labs/tests ordered:  None  Time spend 35 minutes.

## 2020-07-08 NOTE — Assessment & Plan Note (Signed)
Depression/anxiety: takes Risperdal 2mg  qhs, Clonazepam, Depakote, Duloxetine.

## 2020-07-08 NOTE — Telephone Encounter (Deleted)
Received from [facility] for clarification of prescription for clonazepam. Is it suppose to be PRN or scheduled. They have order that is bid scheduled and prn that is every six hours please clarify.Medication pended and sent to Dr. Lyndel Safe

## 2020-07-08 NOTE — Telephone Encounter (Signed)
This encounter was created in error - please disregard.

## 2020-07-08 NOTE — Assessment & Plan Note (Signed)
HTN, takes Lisinopril, Furosemide.

## 2020-07-08 NOTE — Assessment & Plan Note (Signed)
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

## 2020-07-08 NOTE — Assessment & Plan Note (Addendum)
Edema BLE 1+, DP pulses present, takes Furosemide.

## 2020-07-08 NOTE — Assessment & Plan Note (Signed)
Hypothyroidism, TSH wnl 03/2020, takes Levothyroxine.

## 2020-07-30 ENCOUNTER — Encounter: Payer: Self-pay | Admitting: Nurse Practitioner

## 2020-07-30 ENCOUNTER — Non-Acute Institutional Stay (SKILLED_NURSING_FACILITY): Payer: Medicare Other | Admitting: Nurse Practitioner

## 2020-07-30 DIAGNOSIS — M159 Polyosteoarthritis, unspecified: Secondary | ICD-10-CM

## 2020-07-30 DIAGNOSIS — G309 Alzheimer's disease, unspecified: Secondary | ICD-10-CM

## 2020-07-30 DIAGNOSIS — F0150 Vascular dementia without behavioral disturbance: Secondary | ICD-10-CM | POA: Diagnosis not present

## 2020-07-30 DIAGNOSIS — E039 Hypothyroidism, unspecified: Secondary | ICD-10-CM

## 2020-07-30 DIAGNOSIS — K5901 Slow transit constipation: Secondary | ICD-10-CM

## 2020-07-30 DIAGNOSIS — F339 Major depressive disorder, recurrent, unspecified: Secondary | ICD-10-CM

## 2020-07-30 DIAGNOSIS — N816 Rectocele: Secondary | ICD-10-CM | POA: Diagnosis not present

## 2020-07-30 DIAGNOSIS — I10 Essential (primary) hypertension: Secondary | ICD-10-CM | POA: Diagnosis not present

## 2020-07-30 DIAGNOSIS — R609 Edema, unspecified: Secondary | ICD-10-CM

## 2020-07-30 DIAGNOSIS — F0280 Dementia in other diseases classified elsewhere without behavioral disturbance: Secondary | ICD-10-CM | POA: Diagnosis not present

## 2020-07-30 NOTE — Assessment & Plan Note (Addendum)
Edema BLE, takes Furosemide. Bun/creat 25/1.3 06/03/20

## 2020-07-30 NOTE — Assessment & Plan Note (Signed)
Dementia, impulsive, no safety awareness, takes Memantine, resides in SNF [facility] 

## 2020-07-30 NOTE — Progress Notes (Signed)
Location:   [facility] of Service:   SNF Provider:  Cherissa Hook, Lennie Odor NP  [name], MD  Patient Care Team: [name], MD as PCP - General (Internal Medicine) Charnita Trudel X, NP as Nurse Practitioner (Internal Medicine) [name], MD (Internal Medicine)  Extended Emergency Contact Information Primary Emergency Contact: [facility] Address: [address]          [place], [place] [address] [name] of [place] Phone: [phone] Relation: Son Secondary Emergency Contact: [facility] Address: [address] [place] [address] [name] Phone: [phone] Relation: Daughter  Code Status: DNR  Goals of care: Advanced Directive information Advanced Directives 06/10/2020  Does Patient Have a Medical Advance Directive? Yes  Type of Paramedic of [place];Living will;Out of facility DNR (pink MOST or yellow form)  Does patient want to make changes to medical advance directive? No - Patient declined  Copy of [place] in Chart? Yes - validated most recent copy scanned in chart (See row information)  Would patient like information on creating a medical advance directive? -  Pre-existing out of facility DNR order (yellow form or pink MOST form) Yellow form placed in chart (order not valid for inpatient use)     Chief Complaint  Patient presents with   Acute Visit    Constipation    HPI:  Pt is a 86 y.o. female seen today for an acute visit for constipation, rectocele-staff was able to reduce it after BM, no active bleeding or injury associated with the constipation.    Dementia, impulsive, no safety awareness, takes Memantine, resides in SNF [facility] Depression/anxiety: takes Risperdal 2mg qhs, Clonazepam, Depakote, Duloxetine.  HTN, takes Lisinopril, Furosemide. Edema BLE, takes Furosemide. Bun/creat 25/1.3 06/03/20  Hypothyroidism, TSH wnl 03/2020, takes Levothyroxine.  OA, in general, takes Tylenol.      Past Medical History:  Diagnosis Date   Alzheimer disease ([place]) 10/27/2016   Arthritis    Confusion 04/04/2019   Depression, recurrent ([place]) 10/27/2016   History of fracture of right hip 04/04/2019   07/01/19 Ortho, R hip incision healed, X-ray looks good, s/p IMN R hip fx, WBAT, f/u 6 mos.    HTN (hypertension) 10/27/2016   Hypertension    Hypertensive kidney disease with CKD (chronic kidney disease) stage V ([place]) 11/02/2016   11/02/16 Na 136, K 4.7, Bun 30, creat 1.52, TSH 0.86, wbc 6.7, Hgb 11.3, plt 264   Hypothyroidism 10/27/2016   Osteoarthritis 10/27/2016   Thyroid disease    Past Surgical History:  Procedure Laterality Date   ABDOMINAL HYSTERECTOMY  1986   INTRAMEDULLARY (IM) NAIL INTERTROCHANTERIC Right 04/04/2019   Procedure: INTRAMEDULLARY (IM) NAIL INTERTROCHANTRIC;  Surgeon: Rod Can, MD;  Location: [facility];  Service: Orthopedics;  Laterality: Right;   KNEE ARTHROSCOPY  2015/2017   Dr. Gustavus Bryant   SPINE SURGERY  2009, 2011   Dr.Mark Nicoletta Dress   TIBIA IM NAIL INSERTION Right 01/01/2020   Procedure: INTRAMEDULLARY (IM) NAIL TIBIAL;  Surgeon: Shona Needles, MD;  Location: [facility];  Service: Orthopedics;  Laterality: Right;    No Known Allergies  Allergies as of 07/30/2020   No Known Allergies     Medication List       Accurate as of July 30, 2020 11:59 PM. If you have any questions, ask your nurse or doctor.        acetaminophen 325 MG tablet Commonly  known as: TYLENOL Take 650 mg by mouth 2 (two) times daily.   ascorbic acid 500 MG tablet Commonly known as: VITAMIN C Take 500 mg by mouth daily.   aspirin EC 81 MG tablet Take 81 mg by mouth daily.   cholecalciferol 25 MCG (1000 UNIT) tablet Commonly known as: VITAMIN D3 Take 2,000 Units by mouth daily.   clonazePAM 0.5 MG tablet Commonly known as: KLONOPIN Take 0.5 mg  by mouth every 6 (six) hours as needed for anxiety.   clotrimazole-betamethasone cream Commonly known as: LOTRISONE Apply 1 application topically 2 (two) times daily.   divalproex 125 MG DR tablet Commonly known as: DEPAKOTE Take 250 mg by mouth in the morning and at bedtime.   docusate sodium 100 MG capsule Commonly known as: COLACE Take 1 capsule (100 mg total) by mouth 2 (two) times daily.   DULoxetine 30 MG capsule Commonly known as: CYMBALTA Take 30 mg by mouth daily.   furosemide 20 MG tablet Commonly known as: LASIX Take 40 mg by mouth daily.   levothyroxine 25 MCG tablet Commonly known as: SYNTHROID Take 25 mcg by mouth daily before breakfast.   lisinopril 10 MG tablet Commonly known as: ZESTRIL Take 10 mg by mouth daily.   memantine 10 MG tablet Commonly known as: NAMENDA Take 10 mg by mouth 2 (two) times daily.   ondansetron 4 MG disintegrating tablet Commonly known as: ZOFRAN-ODT Take 4 mg by mouth every 8 (eight) hours as needed for nausea or vomiting.   potassium chloride 10 MEQ CR capsule Commonly known as: MICRO-K Take 10 mEq by mouth daily.   potassium chloride SA 20 MEQ tablet Commonly known as: KLOR-CON Take 20 mEq by mouth daily.   risperiDONE 2 MG tablet Commonly known as: RISPERDAL Take 2 mg by mouth at bedtime.   traMADol 50 MG tablet Commonly known as: ULTRAM Take 0.5 tablets (25 mg total) by mouth every 6 (six) hours as needed.   vitamin B-12 1000 MCG tablet Commonly known as: CYANOCOBALAMIN Take 1,000 mcg by mouth. Once a day on Monday and Wednesday       Review of Systems  Constitutional: Negative for activity change, appetite change and fever.       Sleepy upon my visit today, staff reported the patient not sleeping well at night.   HENT: Positive for hearing loss. Negative for congestion and voice change.   Eyes: Negative for visual disturbance.  Respiratory: Negative for cough and shortness of breath.   Cardiovascular:  Positive for leg swelling.  Gastrointestinal: Positive for constipation. Negative for abdominal pain, nausea and vomiting.       Hx of rectocele.   Genitourinary: Negative for dysuria, frequency and urgency.  Musculoskeletal: Positive for arthralgias and gait problem.       S/p R lower leg IM tibia  Skin: Negative for color change.  Neurological: Negative for dizziness, speech difficulty and weakness.       Dementia, lethargic.   Psychiatric/Behavioral: Positive for agitation, behavioral problems, confusion and sleep disturbance. The patient is nervous/anxious.        Improving.     Immunization History  Administered Date(s) Administered   Influenza-Unspecified 05/29/2017, 05/20/2020   Moderna Sars-Covid-2 Vaccination 09/07/2019, 10/05/2019, 06/16/2020   Pneumococcal Conjugate-13 06/21/2017   Pneumococcal Polysaccharide-23 07/16/2018   Tdap 06/21/2017   Pertinent  Health Maintenance Due  Topic Date Due   DEXA SCAN  Completed   PNA vac Low Risk Adult  Completed   INFLUENZA VACCINE  Discontinued  Fall Risk  04/27/2018 04/25/2017  Falls in the past year? No No   Functional Status Survey:    Vitals:   07/30/20 1514  BP: 100/62  Pulse: 94  Resp: 18  Temp: 98.2 F (36.8 C)  SpO2: 95%  Weight: 153 lb 12.8 oz (69.8 kg)  Height: 4\' 11"  (1.499 m)   Body mass index is 31.06 kg/m. Physical Exam Vitals and nursing note reviewed.  Constitutional:      Appearance: Normal appearance.  HENT:     Head: Normocephalic and atraumatic.     Mouth/Throat:     Mouth: Mucous membranes are moist.  Eyes:     Extraocular Movements: Extraocular movements intact.     Conjunctiva/sclera: Conjunctivae normal.     Pupils: Pupils are equal, round, and reactive to light.  Cardiovascular:     Rate and Rhythm: Normal rate and regular rhythm.     Heart sounds: No murmur heard.     Comments: DP pulses present R+L Pulmonary:     Effort: Pulmonary effort is normal.     Breath sounds: No  rales.  Abdominal:     General: Bowel sounds are normal.     Palpations: Abdomen is soft.     Tenderness: There is no abdominal tenderness.  Musculoskeletal:     Right lower leg: Edema present.     Left lower leg: Edema present.     Comments: R s/p IM rod for tibia fx. !+ edema BLE  Skin:    General: Skin is warm and dry.  Neurological:     General: No focal deficit present.     Mental Status: She is alert.     Coordination: Coordination normal.     Gait: Gait abnormal.  Psychiatric:        Mood and Affect: Mood normal.     Comments: Followed simple directions during today's examination, but confused.      Labs reviewed: Recent Labs    01/09/20 0425 01/10/20 0430 01/11/20 0306 01/15/20 0000 04/10/20 0000 05/15/20 0000 06/03/20 0000  NA 140 140 140   < > 141 142 139  K 3.8 3.8 3.6   < > 4.2 5.1 4.9  CL 108 107 110   < > 105 107 102  CO2 23 23 21*   < > 26* 25* 28*  GLUCOSE 103* 90 88  --   --   --   --   BUN 27* 19 14   < > 24* 33* 25*  CREATININE 0.98 0.85 0.74   < > 1.0 1.3* 1.3*  CALCIUM 8.8* 8.6* 8.6*   < > 10.4 10.5 10.5   < > = values in this interval not displayed.   Recent Labs    01/08/20 1448 01/09/20 0425 01/11/20 0306 03/12/20 0000 04/10/20 0000  AST 14* 11* 11* 9* 10*  ALT 10 9 9  6* 7  ALKPHOS 58 49 62 107 98  BILITOT 0.7 0.6 0.8  --   --   PROT 5.9* 5.4* 5.4*  --   --   ALBUMIN 2.6* 2.5* 2.5* 3.5 3.8   Recent Labs    01/08/20 1448 01/08/20 2035 01/09/20 0425 01/10/20 0430 01/11/20 0306 01/21/20 0000 03/12/20 0000 04/10/20 0000  WBC 15.8*   < > 12.6* 15.1* 12.1* 9.0 7.2 7.9  NEUTROABS 12.2*  --   --   --   --   --  3,902 4,487  HGB 8.4*   < > 6.9* 9.9* 10.8* 11.5* 11.0* 11.2*  HCT 27.4*   < > 22.8* 29.8* 34.0* 35* 34* 34*  MCV 97.5   < > 98.7 87.9 91.2  --   --   --   PLT 260   < > 207 217 266 389 234 238   < > = values in this interval not displayed.   Lab Results  Component Value Date   TSH 1.58 03/12/2020   Lab Results   Component Value Date   HGBA1C 5.3 11/01/2016   No results found for: CHOL, HDL, LDLCALC, LDLDIRECT, TRIG, CHOLHDL  Significant Diagnostic Results in last 30 days:  No results found.  Assessment/Plan Slow transit constipation 07/30/20 MIraLax qd.    Rectocele Avoid constipation. It was reduced after BM  Mixed Alzheimer's and vascular dementia ([place]) Dementia, impulsive, no safety awareness, takes Memantine, resides in SNF [facility]   Depression, recurrent ([place]) Depression/anxiety: takes Risperdal 2mg qhs, Clonazepam, Depakote, Duloxetine.   Essential hypertension HTN, takes Lisinopril, Furosemide.  Edema Edema BLE, takes Furosemide. Bun/creat 25/1.3 06/03/20   Hypothyroidism Hypothyroidism, TSH wnl 03/2020, takes Levothyroxine.    Osteoarthritis OA, in general, takes Tylenol.       Family/ staff Communication: plan of care reviewed with the patient and charge nurse.   Labs/tests ordered:  none  Time spend 25 minutes.

## 2020-07-30 NOTE — Assessment & Plan Note (Signed)
HTN, takes Lisinopril, Furosemide.  

## 2020-07-30 NOTE — Assessment & Plan Note (Signed)
Hypothyroidism, TSH wnl 03/2020, takes Levothyroxine.  °

## 2020-07-30 NOTE — Assessment & Plan Note (Signed)
Depression/anxiety: takes Risperdal 2mg qhs, Clonazepam, Depakote, Duloxetine.  

## 2020-07-30 NOTE — Assessment & Plan Note (Addendum)
Avoid constipation. It was reduced after BM

## 2020-07-30 NOTE — Assessment & Plan Note (Signed)
OA, in general, takes Tylenol.   

## 2020-07-30 NOTE — Assessment & Plan Note (Signed)
07/30/20 MIraLax qd.

## 2020-08-03 ENCOUNTER — Encounter: Payer: Self-pay | Admitting: Nurse Practitioner

## 2020-08-06 ENCOUNTER — Encounter: Payer: Self-pay | Admitting: Nurse Practitioner

## 2020-08-11 ENCOUNTER — Non-Acute Institutional Stay (SKILLED_NURSING_FACILITY): Payer: Medicare Other | Admitting: Internal Medicine

## 2020-08-11 ENCOUNTER — Encounter: Payer: Self-pay | Admitting: Internal Medicine

## 2020-08-11 DIAGNOSIS — G309 Alzheimer's disease, unspecified: Secondary | ICD-10-CM

## 2020-08-11 DIAGNOSIS — N816 Rectocele: Secondary | ICD-10-CM

## 2020-08-11 DIAGNOSIS — F32A Depression, unspecified: Secondary | ICD-10-CM

## 2020-08-11 DIAGNOSIS — E039 Hypothyroidism, unspecified: Secondary | ICD-10-CM

## 2020-08-11 DIAGNOSIS — F419 Anxiety disorder, unspecified: Secondary | ICD-10-CM

## 2020-08-11 DIAGNOSIS — F0280 Dementia in other diseases classified elsewhere without behavioral disturbance: Secondary | ICD-10-CM

## 2020-08-11 DIAGNOSIS — M159 Polyosteoarthritis, unspecified: Secondary | ICD-10-CM

## 2020-08-11 DIAGNOSIS — I10 Essential (primary) hypertension: Secondary | ICD-10-CM | POA: Diagnosis not present

## 2020-08-11 DIAGNOSIS — F339 Major depressive disorder, recurrent, unspecified: Secondary | ICD-10-CM

## 2020-08-11 DIAGNOSIS — R296 Repeated falls: Secondary | ICD-10-CM

## 2020-08-11 DIAGNOSIS — N1831 Chronic kidney disease, stage 3a: Secondary | ICD-10-CM

## 2020-08-11 DIAGNOSIS — F0150 Vascular dementia without behavioral disturbance: Secondary | ICD-10-CM

## 2020-08-11 NOTE — Progress Notes (Signed)
Location:  [facility] manager of Service:  SNF (31)  Provider:   Code Status:  Goals of Care:  Advanced Directives 06/10/2020  Does Patient Have a Medical Advance Directive? Yes  Type of Paramedic of [place];Living will;Out of facility DNR (pink MOST or yellow form)  Does patient want to make changes to medical advance directive? No - Patient declined  Copy of [place] in Chart? Yes - validated most recent copy scanned in chart (See row information)  Would patient like information on creating a medical advance directive? -  Pre-existing out of facility DNR order (yellow form or pink MOST form) Yellow form placed in chart (order not valid for inpatient use)     Chief Complaint  Patient presents with  . Medical Management of Chronic Issues    HPI: Patient is a 87 y.o. female seen today for medical management of chronic diseases.    [facility] resident Patienthas h/o Hypertension,  Hypothyroidism , Alzheimer's Dementia with behavior issues,  Depression, B12 def,  S/P Right Hip fracture with Intramedullary Fixation in 8/20 H/O Colitis in 06/21 and Right Tibia Fibula Fracture in 5/21 due to fall S/P IM Placement  Main issues is Behaviors Gets very anxious and tries to leave the facility Also Recurent falls as she does not ask for help and tries to get up from her chair No other issues Weight is stable  No Pain No New nursing issues  Past Medical History:  Diagnosis Date  . Alzheimer disease ([place]) 10/27/2016  . Arthritis   . Confusion 04/04/2019  . Depression, recurrent ([place]) 10/27/2016  . History of fracture of right hip 04/04/2019   07/01/19 Ortho, R hip incision healed, X-ray looks good, s/p IMN R hip fx, WBAT, f/u 6 mos.   Marland Kitchen HTN (hypertension) 10/27/2016  . Hypertension   . Hypertensive kidney disease with CKD (chronic kidney disease) stage V ([place]) 11/02/2016   11/02/16 Na 136, K 4.7, Bun 30,  creat 1.52, TSH 0.86, wbc 6.7, Hgb 11.3, plt 264  . Hypothyroidism 10/27/2016  . Osteoarthritis 10/27/2016  . Thyroid disease     Past Surgical History:  Procedure Laterality Date  . ABDOMINAL HYSTERECTOMY  1986  . INTRAMEDULLARY (IM) NAIL INTERTROCHANTERIC Right 04/04/2019   Procedure: INTRAMEDULLARY (IM) NAIL INTERTROCHANTRIC;  Surgeon: Rod Can, MD;  Location[facility];  Service: Orthopedics;  Laterality: Right;  . KNEE ARTHROSCOPY  2015/2017   Dr. Gustavus Bryant  . SPINE SURGERY  2009, 2011   Dr.Mark Nicoletta Dress  . TIBIA IM NAIL INSERTION Right 01/01/2020   Procedure: INTRAMEDULLARY (IM) NAIL TIBIAL;  Surgeon: Shona Needles, MD;  Location: [facility];  Service: Orthopedics;  Laterality: Right;    No Known Allergies  Outpatient Encounter Medications as of 08/11/2020  Medication Sig  . acetaminophen (TYLENOL) 325 MG tablet Take 650 mg by mouth 2 (two) times daily.  Marland Kitchen ascorbic acid (VITAMIN C) 500 MG tablet Take 500 mg by mouth daily.  Marland Kitchen aspirin EC 81 MG tablet Take 81 mg by mouth daily.   . cholecalciferol (VITAMIN D3) 25 MCG (1000 UT) tablet Take 2,000 Units by mouth daily.   . clonazePAM (KLONOPIN) 0.5 MG tablet Take 0.5 mg by mouth every 6 (six) hours as needed for anxiety.  . clotrimazole-betamethasone (LOTRISONE) cream Apply 1 application topically 2 (two) times daily.  . divalproex (DEPAKOTE) 125 MG DR tablet Take 250 mg by mouth in the morning and at bedtime.   . docusate  sodium (COLACE) 100 MG capsule Take 1 capsule (100 mg total) by mouth 2 (two) times daily.  . DULoxetine (CYMBALTA) 30 MG capsule Take 30 mg by mouth daily.  . furosemide (LASIX) 20 MG tablet Take 40 mg by mouth daily.   Marland Kitchen levothyroxine (SYNTHROID, LEVOTHROID) 25 MCG tablet Take 25 mcg by mouth daily before breakfast.  . lisinopril (ZESTRIL) 10 MG tablet Take 10 mg by mouth daily.  . memantine (NAMENDA) 10 MG tablet Take 10 mg by mouth 2 (two) times daily.  . ondansetron (ZOFRAN-ODT) 4 MG disintegrating tablet Take 4 mg by  mouth every 8 (eight) hours as needed for nausea or vomiting.   . potassium chloride (MICRO-K) 10 MEQ CR capsule Take 10 mEq by mouth daily.  . potassium chloride SA (KLOR-CON) 20 MEQ tablet Take 20 mEq by mouth daily.   . risperiDONE (RISPERDAL) 2 MG tablet Take 2 mg by mouth at bedtime.   . traMADol (ULTRAM) 50 MG tablet Take 0.5 tablets (25 mg total) by mouth every 6 (six) hours as needed.  . vitamin B-12 (CYANOCOBALAMIN) 1000 MCG tablet Take 1,000 mcg by mouth. Once a day on Monday and Wednesday   No facility-administered encounter medications on file as of 08/11/2020.    Review of Systems:  Review of Systems  Unable to perform ROS: Dementia    Health Maintenance  Topic Date Due  . COVID-19 Vaccine (4 - Booster for Moderna series) 12/14/2020  . TETANUS/TDAP  06/22/2027  . DEXA SCAN  Completed  . PNA vac Low Risk Adult  Completed  . INFLUENZA VACCINE  Discontinued    Physical Exam: Vitals:   08/11/20 2203  BP: 102/68  Pulse: 60  Resp: 20  Temp: 97.8 F (36.6 C)  Weight: 152 lb (68.9 kg)   Body mass index is 30.7 kg/m. Physical Exam Vitals reviewed.  Constitutional:      Appearance: Normal appearance.  HENT:     Head: Normocephalic.     Nose: Nose normal.     Mouth/Throat:     Mouth: Mucous membranes are moist.     Pharynx: Oropharynx is clear.  Eyes:     Pupils: Pupils are equal, round, and reactive to light.  Cardiovascular:     Rate and Rhythm: Normal rate and regular rhythm.     Pulses: Normal pulses.  Pulmonary:     Effort: Pulmonary effort is normal.     Breath sounds: Normal breath sounds.  Abdominal:     General: Abdomen is flat. Bowel sounds are normal.     Palpations: Abdomen is soft.  Musculoskeletal:        General: Swelling present.     Cervical back: Neck supple.  Skin:    General: Skin is warm.  Neurological:     General: No focal deficit present.     Mental Status: She is alert.  Psychiatric:        Mood and Affect: Mood normal.         Thought Content: Thought content normal.     Labs reviewed: Basic Metabolic Panel: Recent Labs    01/09/20 0425 01/10/20 0430 01/11/20 0306 01/15/20 0000 03/12/20 0000 04/10/20 0000 05/15/20 0000 06/03/20 0000  NA 140 140 140   < > 139 141 142 139  K 3.8 3.8 3.6   < > 4.6 4.2 5.1 4.9  CL 108 107 110   < > 105 105 107 102  CO2 23 23 21*   < > 30* 26* 25* 28*  GLUCOSE 103* 90 88  --   --   --   --   --   BUN 27* 19 14   < > 20 24* 33* 25*  CREATININE 0.98 0.85 0.74   < > 1.2* 1.0 1.3* 1.3*  CALCIUM 8.8* 8.6* 8.6*   < > 10.1 10.4 10.5 10.5  TSH  --   --   --   --  1.58  --   --   --    < > = values in this interval not displayed.   Liver Function Tests: Recent Labs    01/08/20 1448 01/09/20 0425 01/11/20 0306 03/12/20 0000 04/10/20 0000  AST 14* 11* 11* 9* 10*  ALT 10 9 9  6* 7  ALKPHOS 58 49 62 107 98  BILITOT 0.7 0.6 0.8  --   --   PROT 5.9* 5.4* 5.4*  --   --   ALBUMIN 2.6* 2.5* 2.5* 3.5 3.8   No results for input(s): LIPASE, AMYLASE in the last 8760 hours. No results for input(s): AMMONIA in the last 8760 hours. CBC: Recent Labs    01/08/20 1448 01/08/20 2035 01/09/20 0425 01/10/20 0430 01/11/20 0306 01/21/20 0000 03/12/20 0000 04/10/20 0000  WBC 15.8*   < > 12.6* 15.1* 12.1* 9.0 7.2 7.9  NEUTROABS 12.2*  --   --   --   --   --  3,902 4,487  HGB 8.4*   < > 6.9* 9.9* 10.8* 11.5* 11.0* 11.2*  HCT 27.4*   < > 22.8* 29.8* 34.0* 35* 34* 34*  MCV 97.5   < > 98.7 87.9 91.2  --   --   --   PLT 260   < > 207 217 266 389 234 238   < > = values in this interval not displayed.   Lipid Panel: No results for input(s): CHOL, HDL, LDLCALC, TRIG, CHOLHDL, LDLDIRECT in the last 8760 hours. Lab Results  Component Value Date   HGBA1C 5.3 11/01/2016    Procedures since last visit: No results found.  Assessment/Plan 1. Essential hypertension Stable on Lisinopril  2. Rectocele Avoid Constipation   3. Depression, recurrent ([place]) Stable on Cymbalta  4.  Hypothyroidism, unspecified type TSH normal in 8/21  5. Osteoarthritis of multiple joints, unspecified osteoarthritis type Tylenol and tramadol   6. Mixed Alzheimer's and vascular dementia ([place]) with Behaviors Namenda for behaviors Also on Depakote and Klonipin  7. Anxiety and depression Continue Klonipin  8. Recurrent falls Supportive care  9. Chronic kidney disease, stage 3b (H[facility]) Creat stable 10. Bilateral Edema On Lasix and Potassium   Labs/tests ordered:  * No order type specified * Next appt:  Visit date not found

## 2020-09-17 ENCOUNTER — Non-Acute Institutional Stay (SKILLED_NURSING_FACILITY): Payer: Medicare Other | Admitting: Nurse Practitioner

## 2020-09-17 ENCOUNTER — Encounter: Payer: Self-pay | Admitting: Nurse Practitioner

## 2020-09-17 DIAGNOSIS — R609 Edema, unspecified: Secondary | ICD-10-CM

## 2020-09-17 DIAGNOSIS — F0150 Vascular dementia without behavioral disturbance: Secondary | ICD-10-CM

## 2020-09-17 DIAGNOSIS — F339 Major depressive disorder, recurrent, unspecified: Secondary | ICD-10-CM | POA: Diagnosis not present

## 2020-09-17 DIAGNOSIS — M159 Polyosteoarthritis, unspecified: Secondary | ICD-10-CM

## 2020-09-17 DIAGNOSIS — G309 Alzheimer's disease, unspecified: Secondary | ICD-10-CM

## 2020-09-17 DIAGNOSIS — K5901 Slow transit constipation: Secondary | ICD-10-CM

## 2020-09-17 DIAGNOSIS — I10 Essential (primary) hypertension: Secondary | ICD-10-CM | POA: Diagnosis not present

## 2020-09-17 DIAGNOSIS — N816 Rectocele: Secondary | ICD-10-CM

## 2020-09-17 DIAGNOSIS — E039 Hypothyroidism, unspecified: Secondary | ICD-10-CM

## 2020-09-17 DIAGNOSIS — F0280 Dementia in other diseases classified elsewhere without behavioral disturbance: Secondary | ICD-10-CM

## 2020-09-17 NOTE — Assessment & Plan Note (Signed)
TSH wnl 03/2020, takes Levothyroxine.   

## 2020-09-17 NOTE — Assessment & Plan Note (Signed)
takes Lisinopril, Furosemide.

## 2020-09-17 NOTE — Assessment & Plan Note (Signed)
takes Furosemide. Bun/creat 25/1.3 06/03/20

## 2020-09-17 NOTE — Assessment & Plan Note (Signed)
Avoid constipation 

## 2020-09-17 NOTE — Assessment & Plan Note (Signed)
in general, takes Tylenol, Tramadol

## 2020-09-17 NOTE — Assessment & Plan Note (Signed)
impulsive, no safety awareness, takes Memantine, resides in SNF [facility]  

## 2020-09-17 NOTE — Assessment & Plan Note (Signed)
Continue Colace, MiraLax.

## 2020-09-17 NOTE — Assessment & Plan Note (Signed)
takes Risperdal 2mgqhs, Depakote, Duloxetine, Clonazepam. 

## 2020-09-17 NOTE — Progress Notes (Signed)
Location:    [facility] Room Number: 42 Place of Service:  SNF (31) Provider: Lennie Odor Kierstin January NP  [name], MD  Patient Care Team: [name], MD as PCP - General (Internal Medicine) Jossalin Chervenak X, NP as Nurse Practitioner (Internal Medicine) [name], MD (Internal Medicine)  Extended Emergency Contact Information Primary Emergency Contact: [facility] Address: [address]          [place], [place] [address] [name] of [place] Phone: [phone] Relation: Son Secondary Emergency Contact: [facility] Address: [address] [place] [address] [name] Phone: [phone] Relation: Daughter  Code Status:  DNR Goals of care: Advanced Directive information Advanced Directives 06/10/2020  Does Patient Have a Medical Advance Directive? Yes  Type of Paramedic of [place];Living will;Out of facility DNR (pink MOST or yellow form)  Does patient want to make changes to medical advance directive? No - Patient declined  Copy of [place] in Chart? Yes - validated most recent copy scanned in chart (See row information)  Would patient like information on creating a medical advance directive? -  Pre-existing out of facility DNR order (yellow form or pink MOST form) Yellow form placed in chart (order not valid for inpatient use)     Chief Complaint  Patient presents with  . Medical Management of Chronic Issues    HPI:  Pt is a 87 y.o. female seen today for medical management of chronic diseases.    Constipation, takes Colace, MiraLax.   Rectocele avoid constipation  Dementia, impulsive, no safety awareness, takes Memantine, resides in SNF [facility] Depression/anxiety: takes Risperdal 2mg qhs, Depakote, Duloxetine, Clonazepam.  HTN, takes Lisinopril, Furosemide. Edema BLE, takes Furosemide. Bun/creat 25/1.3 06/03/20    Hypothyroidism, TSH wnl 03/2020, takes Levothyroxine.  OA, in general, takes Tylenol, Tramadol   Past Medical History:  Diagnosis Date  . Alzheimer disease ([place]) 10/27/2016  . Arthritis   . Confusion 04/04/2019  . Depression, recurrent ([place]) 10/27/2016  . History of fracture of right hip 04/04/2019   07/01/19 Ortho, R hip incision healed, X-ray looks good, s/p IMN R hip fx, WBAT, f/u 6 mos.   Marland Kitchen HTN (hypertension) 10/27/2016  . Hypertension   . Hypertensive kidney disease with CKD (chronic kidney disease) stage V ([place]) 11/02/2016   11/02/16 Na 136, K 4.7, Bun 30, creat 1.52, TSH 0.86, wbc 6.7, Hgb 11.3, plt 264  . Hypothyroidism 10/27/2016  . Osteoarthritis 10/27/2016  . Thyroid disease    Past Surgical History:  Procedure Laterality Date  . ABDOMINAL HYSTERECTOMY  1986  . INTRAMEDULLARY (IM) NAIL INTERTROCHANTERIC Right 04/04/2019   Procedure: INTRAMEDULLARY (IM) NAIL INTERTROCHANTRIC;  Surgeon: Rod Can, MD;  Location: [facility];  Service: Orthopedics;  Laterality: Right;  . KNEE ARTHROSCOPY  2015/2017   Dr. Gustavus Bryant  . SPINE SURGERY  2009, 2011   Dr.Mark Nicoletta Dress  . TIBIA IM NAIL INSERTION Right 01/01/2020   Procedure: INTRAMEDULLARY (IM) NAIL TIBIAL;  Surgeon: Shona Needles, MD;  Location: [facility];  Service: Orthopedics;  Laterality: Right;    No Known Allergies  Allergies as of 09/17/2020   No Known Allergies     Medication List       Accurate as of September 17, 2020 11:59 PM. If you have any questions, ask your nurse or doctor.        STOP taking these medications   clotrimazole-betamethasone cream Commonly known  as: LOTRISONE Stopped by: Jenavieve Freda X Penni Penado, NP     TAKE these medications   acetaminophen 325 MG tablet Commonly known as: TYLENOL Take 650 mg by mouth 2 (two) times daily.   ascorbic acid 500 MG tablet Commonly known as: VITAMIN C Take 500 mg by mouth daily.   aspirin EC 81 MG tablet Take 81 mg by mouth daily.   cholecalciferol  25 MCG (1000 UNIT) tablet Commonly known as: VITAMIN D3 Take 2,000 Units by mouth daily.   clonazePAM 0.5 MG tablet Commonly known as: KLONOPIN Take 0.5 mg by mouth every 6 (six) hours as needed for anxiety.   divalproex 125 MG DR tablet Commonly known as: DEPAKOTE Take 250 mg by mouth in the morning and at bedtime.   docusate sodium 100 MG capsule Commonly known as: COLACE Take 1 capsule (100 mg total) by mouth 2 (two) times daily.   DULoxetine 30 MG capsule Commonly known as: CYMBALTA Take 30 mg by mouth daily.   furosemide 20 MG tablet Commonly known as: LASIX Take 40 mg by mouth daily.   levothyroxine 25 MCG tablet Commonly known as: SYNTHROID Take 25 mcg by mouth daily before breakfast.   lisinopril 10 MG tablet Commonly known as: ZESTRIL Take 10 mg by mouth daily.   memantine 10 MG tablet Commonly known as: NAMENDA Take 10 mg by mouth 2 (two) times daily.   ondansetron 4 MG disintegrating tablet Commonly known as: ZOFRAN-ODT Take 4 mg by mouth every 8 (eight) hours as needed for nausea or vomiting.   polyethylene glycol 17 g packet Commonly known as: MIRALAX / GLYCOLAX Take 17 g by mouth daily.   potassium chloride 10 MEQ CR capsule Commonly known as: MICRO-K Take 10 mEq by mouth daily.   potassium chloride SA 20 MEQ tablet Commonly known as: KLOR-CON Take 20 mEq by mouth daily.   risperiDONE 2 MG tablet Commonly known as: RISPERDAL Take 2 mg by mouth at bedtime.   traMADol 50 MG tablet Commonly known as: ULTRAM Take 0.5 tablets (25 mg total) by mouth every 6 (six) hours as needed.   vitamin B-12 1000 MCG tablet Commonly known as: CYANOCOBALAMIN Take 1,000 mcg by mouth. Once a day on Monday and Wednesday       Review of Systems  Constitutional: Negative for activity change, fever and unexpected weight change.  HENT: Positive for hearing loss. Negative for congestion and voice change.   Eyes: Negative for visual disturbance.  Respiratory:  Negative for cough and shortness of breath.   Cardiovascular: Positive for leg swelling.  Gastrointestinal: Negative for abdominal pain and constipation.       Hx of rectocele.   Genitourinary: Negative for dysuria, frequency and urgency.  Musculoskeletal: Positive for arthralgias and gait problem.       S/p R lower leg IM tibia  Skin: Negative for color change.  Neurological: Negative for speech difficulty, weakness, light-headedness and headaches.       Dementia, lethargic.   Psychiatric/Behavioral: Positive for agitation, behavioral problems, confusion and sleep disturbance. The patient is nervous/anxious.        Improving.     Immunization History  Administered Date(s) Administered  . Influenza-Unspecified 05/29/2017, 05/20/2020  . Moderna Sars-Covid-2 Vaccination 09/07/2019, 10/05/2019, 06/16/2020  . Pneumococcal Conjugate-13 06/21/2017  . Pneumococcal Polysaccharide-23 07/16/2018  . Tdap 06/21/2017   Pertinent  Health Maintenance Due  Topic Date Due  . DEXA SCAN  Completed  . PNA vac Low Risk Adult  Completed  . INFLUENZA VACCINE  Discontinued   Fall Risk  04/27/2018 04/25/2017  Falls in the past year? No No   Functional Status Survey:    Vitals:   09/17/20 1538  BP: 135/87  Pulse: 93  Resp: 16  Temp: 98.8 F (37.1 C)  SpO2: 93%  Weight: 154 lb 3.2 oz (69.9 kg)  Height: 4\' 11"  (1.499 m)   Body mass index is 31.14 kg/m. Physical Exam Vitals and nursing note reviewed.  Constitutional:      Appearance: Normal appearance.  HENT:     Head: Normocephalic and atraumatic.     Mouth/Throat:     Mouth: Mucous membranes are moist.  Eyes:     Extraocular Movements: Extraocular movements intact.     Conjunctiva/sclera: Conjunctivae normal.     Pupils: Pupils are equal, round, and reactive to light.  Cardiovascular:     Rate and Rhythm: Normal rate and regular rhythm.     Heart sounds: No murmur heard.     Comments: DP pulses present R+L Pulmonary:     Effort:  Pulmonary effort is normal.     Breath sounds: No rales.  Abdominal:     General: Bowel sounds are normal.     Palpations: Abdomen is soft.     Tenderness: There is no abdominal tenderness.  Musculoskeletal:     Right lower leg: Edema present.     Left lower leg: Edema present.     Comments: R s/p IM rod for tibia fx. trace edema BLE  Skin:    General: Skin is warm and dry.  Neurological:     General: No focal deficit present.     Mental Status: She is alert.     Coordination: Coordination normal.     Gait: Gait abnormal.  Psychiatric:        Mood and Affect: Mood normal.     Comments: Followed simple directions during today's examination, but confused.      Labs reviewed: Recent Labs    01/09/20 0425 01/10/20 0430 01/11/20 0306 01/15/20 0000 04/10/20 0000 05/15/20 0000 06/03/20 0000  NA 140 140 140   < > 141 142 139  K 3.8 3.8 3.6   < > 4.2 5.1 4.9  CL 108 107 110   < > 105 107 102  CO2 23 23 21*   < > 26* 25* 28*  GLUCOSE 103* 90 88  --   --   --   --   BUN 27* 19 14   < > 24* 33* 25*  CREATININE 0.98 0.85 0.74   < > 1.0 1.3* 1.3*  CALCIUM 8.8* 8.6* 8.6*   < > 10.4 10.5 10.5   < > = values in this interval not displayed.   Recent Labs    01/08/20 1448 01/09/20 0425 01/11/20 0306 03/12/20 0000 04/10/20 0000  AST 14* 11* 11* 9* 10*  ALT 10 9 9  6* 7  ALKPHOS 58 49 62 107 98  BILITOT 0.7 0.6 0.8  --   --   PROT 5.9* 5.4* 5.4*  --   --   ALBUMIN 2.6* 2.5* 2.5* 3.5 3.8   Recent Labs    01/08/20 1448 01/08/20 2035 01/09/20 0425 01/10/20 0430 01/11/20 0306 01/21/20 0000 03/12/20 0000 04/10/20 0000  WBC 15.8*   < > 12.6* 15.1* 12.1* 9.0 7.2 7.9  NEUTROABS 12.2*  --   --   --   --   --  3,902 4,487  HGB 8.4*   < > 6.9* 9.9* 10.8* 11.5*  11.0* 11.2*  HCT 27.4*   < > 22.8* 29.8* 34.0* 35* 34* 34*  MCV 97.5   < > 98.7 87.9 91.2  --   --   --   PLT 260   < > 207 217 266 389 234 238   < > = values in this interval not displayed.   Lab Results  Component  Value Date   TSH 1.58 03/12/2020   Lab Results  Component Value Date   HGBA1C 5.3 11/01/2016   No results found for: CHOL, HDL, LDLCALC, LDLDIRECT, TRIG, CHOLHDL  Significant Diagnostic Results in last 30 days:  No results found.  Assessment/Plan  Depression, recurrent (H[facility])  takes Risperdal 2mg qhs, Depakote, Duloxetine, Clonazepam.   Essential hypertension takes Lisinopril, Furosemide.  Edema takes Furosemide. Bun/creat 25/1.3 06/03/20   Hypothyroidism TSH wnl 03/2020, takes Levothyroxine.    Osteoarthritis  in general, takes Tylenol, Tramadol  Mixed Alzheimer's and vascular dementia ([place]) impulsive, no safety awareness, takes Memantine, resides in SNF [facility]   Rectocele Avoid constipation.   Slow transit constipation Continue Colace, MiraLax.    Family/ staff Communication: plan of care reviewed with the patient and charge nurse.   Labs/tests ordered: none  Time spend 35 minutes.

## 2020-09-18 ENCOUNTER — Encounter: Payer: Self-pay | Admitting: Nurse Practitioner

## 2020-09-21 ENCOUNTER — Encounter: Payer: Self-pay | Admitting: Nurse Practitioner

## 2020-09-21 ENCOUNTER — Non-Acute Institutional Stay (SKILLED_NURSING_FACILITY): Payer: Medicare Other | Admitting: Nurse Practitioner

## 2020-09-21 DIAGNOSIS — R609 Edema, unspecified: Secondary | ICD-10-CM | POA: Diagnosis not present

## 2020-09-21 DIAGNOSIS — F0150 Vascular dementia without behavioral disturbance: Secondary | ICD-10-CM

## 2020-09-21 DIAGNOSIS — N816 Rectocele: Secondary | ICD-10-CM

## 2020-09-21 DIAGNOSIS — K5901 Slow transit constipation: Secondary | ICD-10-CM

## 2020-09-21 DIAGNOSIS — F419 Anxiety disorder, unspecified: Secondary | ICD-10-CM

## 2020-09-21 DIAGNOSIS — F0280 Dementia in other diseases classified elsewhere without behavioral disturbance: Secondary | ICD-10-CM

## 2020-09-21 DIAGNOSIS — E039 Hypothyroidism, unspecified: Secondary | ICD-10-CM

## 2020-09-21 DIAGNOSIS — G309 Alzheimer's disease, unspecified: Secondary | ICD-10-CM

## 2020-09-21 DIAGNOSIS — I10 Essential (primary) hypertension: Secondary | ICD-10-CM

## 2020-09-21 DIAGNOSIS — F32A Depression, unspecified: Secondary | ICD-10-CM

## 2020-09-21 NOTE — Assessment & Plan Note (Signed)
TSH wnl 03/2020, takes Levothyroxine.   

## 2020-09-21 NOTE — Progress Notes (Signed)
Location:   [facility] Room Number: 42 Place of Service:  SNF (31) Provider:  Cordella Nyquist, Lennie Odor NP  [name], MD  Patient Care Team: [name], MD as PCP - General (Internal Medicine) Giovanie Lefebre X, NP as Nurse Practitioner (Internal Medicine) [name], MD (Internal Medicine)  Extended Emergency Contact Information Primary Emergency Contact: [facility] Address: [address]          [place], [place] [address] [name] of [place] Phone: [phone] Relation: Son Secondary Emergency Contact: [facility] Address: [address] [place] [address] [name] Phone: [phone] Relation: Daughter  Code Status:  DNR Goals of care: Advanced Directive information Advanced Directives 06/10/2020  Does Patient Have a Medical Advance Directive? Yes  Type of Paramedic of [place];Living will;Out of facility DNR (pink MOST or yellow form)  Does patient want to make changes to medical advance directive? No - Patient declined  Copy of [place] in Chart? Yes - validated most recent copy scanned in chart (See row information)  Would patient like information on creating a medical advance directive? -  Pre-existing out of facility DNR order (yellow form or pink MOST form) Yellow form placed in chart (order not valid for inpatient use)     Chief Complaint  Patient presents with  . Acute Visit    Left eye is swollen and droopy    HPI:  Pt is a 87 y.o. female seen today for an acute visit for left eye swollen and droopy, the patient was seen in the recliner sleeping with her left face rested on the armrest. The patient is easily aroused, denied change of vision or eye pain,  no redness in eyes or eyelids. No facial weakness.  Edema, dependent, usually seen in legs when sitting up too long, takes Furosemide.Bun/creat 25/1.3 06/03/20  Constipation, takes Colace, MiraLax.               Rectocele avoid constipation             Dementia, impulsive, no safety awareness, takes Memantine, resides in SN[facility] Depression/anxiety: takes Risperdal 2mg qhs, Depakote, Duloxetine, Clonazepam.  HTN, takes Lisinopril, Furosemide.             Hypothyroidism, TSH wnl 03/2020, takes Levothyroxine.  OA,  Multiple sites, prn Tylenol, Tramadol available to her.    Past Medical History:  Diagnosis Date  . Alzheimer disease ([place]) 10/27/2016  . Arthritis   . Confusion 04/04/2019  . Depression, recurrent ([place]) 10/27/2016  . History of fracture of right hip 04/04/2019   07/01/19 Ortho, R hip incision healed, X-ray looks good, s/p IMN R hip fx, WBAT, f/u 6 mos.   Marland Kitchen HTN (hypertension) 10/27/2016  . Hypertension   . Hypertensive kidney disease with CKD (chronic kidney disease) stage V ([place]) 11/02/2016   11/02/16 Na 136, K 4.7, Bun 30, creat 1.52, TSH 0.86, wbc 6.7, Hgb 11.3, plt 264  . Hypothyroidism 10/27/2016  . Osteoarthritis 10/27/2016  . Thyroid disease    Past Surgical History:  Procedure Laterality Date  . ABDOMINAL HYSTERECTOMY  1986  . INTRAMEDULLARY (IM) NAIL INTERTROCHANTERIC Right 04/04/2019   Procedure: INTRAMEDULLARY (IM) NAIL INTERTROCHANTRIC;  Surgeon: Rod Can, MD;  [facility];  Service: Orthopedics;  Laterality: Right;  . KNEE ARTHROSCOPY  2015/2017   Dr. Gustavus Bryant  . SPINE SURGERY  2009, 2011   Dr.Mark Nicoletta Dress  .  TIBIA IM NAIL INSERTION Right 01/01/2020   Procedure: INTRAMEDULLARY (IM) NAIL TIBIAL;  Surgeon: Shona Needles, MD;  Location: [facility];  Service: Orthopedics;  Laterality: Right;    No Known Allergies  Allergies as of 09/21/2020   No Known Allergies     Medication List       Accurate as of September 21, 2020  4:27 PM. If you have any questions, ask your nurse or doctor.        acetaminophen 325 MG tablet Commonly known as: TYLENOL Take 650 mg by mouth 2 (two) times daily.   ascorbic  acid 500 MG tablet Commonly known as: VITAMIN C Take 500 mg by mouth daily.   aspirin EC 81 MG tablet Take 81 mg by mouth daily.   cholecalciferol 25 MCG (1000 UNIT) tablet Commonly known as: VITAMIN D3 Take 2,000 Units by mouth daily.   clonazePAM 0.5 MG tablet Commonly known as: KLONOPIN Take 0.5 mg by mouth every 6 (six) hours as needed for anxiety.   divalproex 125 MG DR tablet Commonly known as: DEPAKOTE Take 250 mg by mouth in the morning and at bedtime.   docusate sodium 100 MG capsule Commonly known as: COLACE Take 1 capsule (100 mg total) by mouth 2 (two) times daily.   DULoxetine 30 MG capsule Commonly known as: CYMBALTA Take 30 mg by mouth daily.   furosemide 20 MG tablet Commonly known as: LASIX Take 40 mg by mouth daily.   levothyroxine 25 MCG tablet Commonly known as: SYNTHROID Take 25 mcg by mouth daily before breakfast.   lisinopril 10 MG tablet Commonly known as: ZESTRIL Take 10 mg by mouth daily.   memantine 10 MG tablet Commonly known as: NAMENDA Take 10 mg by mouth 2 (two) times daily.   ondansetron 4 MG disintegrating tablet Commonly known as: ZOFRAN-ODT Take 4 mg by mouth every 8 (eight) hours as needed for nausea or vomiting.   polyethylene glycol 17 g packet Commonly known as: MIRALAX / GLYCOLAX Take 17 g by mouth daily.   potassium chloride 10 MEQ CR capsule Commonly known as: MICRO-K Take 10 mEq by mouth daily.   potassium chloride SA 20 MEQ tablet Commonly known as: KLOR-CON Take 20 mEq by mouth daily.   risperiDONE 2 MG tablet Commonly known as: RISPERDAL Take 2 mg by mouth at bedtime.   traMADol 50 MG tablet Commonly known as: ULTRAM Take 0.5 tablets (25 mg total) by mouth every 6 (six) hours as needed.   vitamin B-12 1000 MCG tablet Commonly known as: CYANOCOBALAMIN Take 1,000 mcg by mouth. Once a day on Monday and Wednesday       Review of Systems  Constitutional: Negative for activity change, appetite change  and fever.  HENT: Positive for hearing loss. Negative for congestion and voice change.   Eyes: Negative for visual disturbance.  Respiratory: Negative for cough and shortness of breath.   Cardiovascular: Positive for leg swelling.  Gastrointestinal: Negative for abdominal pain and constipation.       Hx of rectocele.   Genitourinary: Negative for dysuria, frequency and urgency.  Musculoskeletal: Positive for arthralgias and gait problem.       S/p R lower leg IM tibia  Skin: Negative for color change.  Neurological: Negative for facial asymmetry, speech difficulty, weakness, light-headedness and headaches.       Dementia, lethargic.   Psychiatric/Behavioral: Positive for agitation, behavioral problems, confusion and sleep disturbance. The patient is nervous/anxious.        Improving.  Immunization History  Administered Date(s) Administered  . Influenza-Unspecified 05/29/2017, 05/20/2020  . Moderna Sars-Covid-2 Vaccination 09/07/2019, 10/05/2019, 06/16/2020  . Pneumococcal Conjugate-13 06/21/2017  . Pneumococcal Polysaccharide-23 07/16/2018  . Tdap 06/21/2017   Pertinent  Health Maintenance Due  Topic Date Due  . DEXA SCAN  Completed  . PNA vac Low Risk Adult  Completed  . INFLUENZA VACCINE  Discontinued   Fall Risk  04/27/2018 04/25/2017  Falls in the past year? No No   Functional Status Survey:    Vitals:   09/21/20 1546  BP: 135/87  Pulse: 93  Temp: (!) 97.5 F (36.4 C)  SpO2: 93%  Weight: 154 lb 3.2 oz (69.9 kg)  Height: 4\' 11"  (1.499 m)   Body mass index is 31.14 kg/m. Physical Exam Vitals and nursing note reviewed.  Constitutional:      Appearance: Normal appearance.  HENT:     Head: Normocephalic and atraumatic.     Mouth/Throat:     Mouth: Mucous membranes are moist.  Eyes:     Extraocular Movements: Extraocular movements intact.     Conjunctiva/sclera: Conjunctivae normal.     Pupils: Pupils are equal, round, and reactive to light.   Cardiovascular:     Rate and Rhythm: Normal rate and regular rhythm.     Heart sounds: No murmur heard.     Comments: DP pulses present R+L Pulmonary:     Effort: Pulmonary effort is normal.     Breath sounds: No rales.  Abdominal:     General: Bowel sounds are normal.     Palpations: Abdomen is soft.     Tenderness: There is no abdominal tenderness.  Musculoskeletal:     Right lower leg: Edema present.     Left lower leg: Edema present.     Comments: R s/p IM rod for tibia fx. trace edema BLE. Left facial dependent edema from sleeping on the left sides of her face  Skin:    General: Skin is warm and dry.  Neurological:     General: No focal deficit present.     Mental Status: She is alert.     Coordination: Coordination normal.     Gait: Gait abnormal.  Psychiatric:        Mood and Affect: Mood normal.     Comments: Followed simple directions during today's examination, but confused.      Labs reviewed: Recent Labs    01/09/20 0425 01/10/20 0430 01/11/20 0306 01/15/20 0000 04/10/20 0000 05/15/20 0000 06/03/20 0000  NA 140 140 140   < > 141 142 139  K 3.8 3.8 3.6   < > 4.2 5.1 4.9  CL 108 107 110   < > 105 107 102  CO2 23 23 21*   < > 26* 25* 28*  GLUCOSE 103* 90 88  --   --   --   --   BUN 27* 19 14   < > 24* 33* 25*  CREATININE 0.98 0.85 0.74   < > 1.0 1.3* 1.3*  CALCIUM 8.8* 8.6* 8.6*   < > 10.4 10.5 10.5   < > = values in this interval not displayed.   Recent Labs    01/08/20 1448 01/09/20 0425 01/11/20 0306 03/12/20 0000 04/10/20 0000  AST 14* 11* 11* 9* 10*  ALT 10 9 9  6* 7  ALKPHOS 58 49 62 107 98  BILITOT 0.7 0.6 0.8  --   --   PROT 5.9* 5.4* 5.4*  --   --  ALBUMIN 2.6* 2.5* 2.5* 3.5 3.8   Recent Labs    01/08/20 1448 01/08/20 2035 01/09/20 0425 01/10/20 0430 01/11/20 0306 01/21/20 0000 03/12/20 0000 04/10/20 0000  WBC 15.8*   < > 12.6* 15.1* 12.1* 9.0 7.2 7.9  NEUTROABS 12.2*  --   --   --   --   --  3,902 4,487  HGB 8.4*   < >  6.9* 9.9* 10.8* 11.5* 11.0* 11.2*  HCT 27.4*   < > 22.8* 29.8* 34.0* 35* 34* 34*  MCV 97.5   < > 98.7 87.9 91.2  --   --   --   PLT 260   < > 207 217 266 389 234 238   < > = values in this interval not displayed.   Lab Results  Component Value Date   TSH 1.58 03/12/2020   Lab Results  Component Value Date   HGBA1C 5.3 11/01/2016   No results found for: CHOL, HDL, LDLCALC, LDLDIRECT, TRIG, CHOLHDL  Significant Diagnostic Results in last 30 days:  No results found.  Assessment/Plan Edema Dependent edema, legs if sitting up too long. Reported the patient left eye swollen and droopy due to her habitual sleeping on the left side of face. Continue  Furosemide.Bun/creat 25/1.3 06/03/20  Slow transit constipation  takes Colace, MiraLax.    Rectocele Rectocele avoid constipation  Mixed Alzheimer's and vascular dementia ([place]) impulsive, no safety awareness, takes Memantine, resides in SNF [facility]   Anxiety and depression  takes Risperdal 2mg qhs, Depakote, Duloxetine, Clonazepam.   Essential hypertension takes Lisinopril, Furosemide.  Hypothyroidism TSH wnl 03/2020, takes Levothyroxine.    Generalized osteoarthritis of multiple sites Multiple sites, prn Tylenol, Tramadol available to her.       Family/ staff Communication: plan of care reviewed with the patient and charge nurse.   Labs/tests ordered:  none  Time spend 25 minutes.

## 2020-09-21 NOTE — Assessment & Plan Note (Signed)
impulsive, no safety awareness, takes Memantine, resides in SNF [facility]  

## 2020-09-21 NOTE — Assessment & Plan Note (Signed)
takes Risperdal 2mgqhs, Depakote, Duloxetine, Clonazepam. 

## 2020-09-21 NOTE — Assessment & Plan Note (Signed)
takes Lisinopril, Furosemide.

## 2020-09-21 NOTE — Assessment & Plan Note (Signed)
takes Colace, MiraLax 

## 2020-09-21 NOTE — Assessment & Plan Note (Signed)
Rectocele avoid constipation 

## 2020-09-21 NOTE — Assessment & Plan Note (Signed)
Dependent edema, legs if sitting up too long. Reported the patient left eye swollen and droopy due to her habitual sleeping on the left side of face. Continue  Furosemide.Bun/creat 25/1.3 06/03/20

## 2020-09-21 NOTE — Assessment & Plan Note (Signed)
Multiple sites, prn Tylenol, Tramadol available to her.  

## 2020-09-28 ENCOUNTER — Other Ambulatory Visit: Payer: Self-pay | Admitting: *Deleted

## 2020-09-28 NOTE — Telephone Encounter (Signed)
Received refill Request from [facility] Pended Rx and sent to Dr. Gupta for approval.  

## 2020-09-29 MED ORDER — CLONAZEPAM 0.5 MG PO TABS: 0.5000 mg | ORAL_TABLET | Freq: Four times a day (QID) | ORAL | 0 refills | Status: DC | PRN

## 2020-10-21 ENCOUNTER — Non-Acute Institutional Stay (SKILLED_NURSING_FACILITY): Payer: Medicare Other | Admitting: Nurse Practitioner

## 2020-10-21 ENCOUNTER — Encounter: Payer: Self-pay | Admitting: Nurse Practitioner

## 2020-10-21 DIAGNOSIS — M159 Polyosteoarthritis, unspecified: Secondary | ICD-10-CM

## 2020-10-21 DIAGNOSIS — F0150 Vascular dementia without behavioral disturbance: Secondary | ICD-10-CM

## 2020-10-21 DIAGNOSIS — N816 Rectocele: Secondary | ICD-10-CM

## 2020-10-21 DIAGNOSIS — I10 Essential (primary) hypertension: Secondary | ICD-10-CM

## 2020-10-21 DIAGNOSIS — E039 Hypothyroidism, unspecified: Secondary | ICD-10-CM | POA: Diagnosis not present

## 2020-10-21 DIAGNOSIS — K5901 Slow transit constipation: Secondary | ICD-10-CM

## 2020-10-21 DIAGNOSIS — F339 Major depressive disorder, recurrent, unspecified: Secondary | ICD-10-CM | POA: Diagnosis not present

## 2020-10-21 DIAGNOSIS — F0280 Dementia in other diseases classified elsewhere without behavioral disturbance: Secondary | ICD-10-CM

## 2020-10-21 DIAGNOSIS — G309 Alzheimer's disease, unspecified: Secondary | ICD-10-CM

## 2020-10-21 NOTE — Assessment & Plan Note (Signed)
Stabilizing depression/anxiety: takes Risperdal 2mg qhs, Depakote, Duloxetine, Clonazepam.

## 2020-10-21 NOTE — Assessment & Plan Note (Signed)
impulsive, no safety awareness, takes Memantine, resides in SNF [facility]  

## 2020-10-21 NOTE — Assessment & Plan Note (Signed)
TSH wnl 03/2020, takes Levothyroxine.   

## 2020-10-21 NOTE — Assessment & Plan Note (Addendum)
Blood pressure runs low, may consider decrease  Lisinopril to 5mg  qd if persisted low Bp, continue Furosemide, ASA

## 2020-10-21 NOTE — Assessment & Plan Note (Signed)
Rectocele avoid constipation 

## 2020-10-21 NOTE — Assessment & Plan Note (Signed)
dependent, usually seen in legs when sitting up too long, takes Furosemide.Bun/creat 25/1.3 06/03/20

## 2020-10-21 NOTE — Progress Notes (Addendum)
Location:   [place] Room Number: 42 Place of Service:  SNF (31) Provider: Lennie Odor Maruice Pieroni NP  [name], MD  Patient Care Team: [name], MD as PCP - General (Internal Medicine) Kyndal Heringer X, NP as Nurse Practitioner (Internal Medicine) [name], MD (Internal Medicine)  Extended Emergency Contact Information Primary Emergency Contact: [facility] Address: [address]          [place], [place] [address] [name] of [place] Phone: [phone] Relation: Son Secondary Emergency Contact: [facility] Address: [address] [place] [address] [name] Phone: [phone] Relation: Daughter  Code Status:  DNR Goals of care: Advanced Directive information Advanced Directives 10/21/2020  Does Patient Have a Medical Advance Directive? Yes  Type of Advance Directive Out of facility DNR (pink MOST or yellow form)  Does patient want to make changes to medical advance directive? No - Patient declined  Copy of [place] in Chart? -  Would patient like information on creating a medical advance directive? -  Pre-existing out of facility DNR order (yellow form or pink MOST form) Yellow form placed in chart (order not valid for inpatient use)     Chief Complaint  Patient presents with  . Medical Management of Chronic Issues    HPI:  Pt is a 87 y.o. female seen today for medical management of chronic diseases.      Edema, dependent, usually seen in legs when sitting up too long, takes Furosemide.Bun/creat 25/1.3 06/03/20             Constipation, takes Colace, MiraLax.  Rectocele avoid constipation Dementia, impulsive, no safety awareness, takes Memantine, resides in SNF [facility] Depression/anxiety: takes Risperdal 2mg qhs, Depakote, Duloxetine, Clonazepam. HTN, takes Lisinopril, Furosemide, ASA Hypothyroidism, TSH wnl  03/2020, takes Levothyroxine.  OA,  Multiple sites, prn Tylenol, Tramadol available to her.     Past Medical History:  Diagnosis Date  . Alzheimer disease ([place]) 10/27/2016  . Arthritis   . Confusion 04/04/2019  . Depression, recurrent ([place]) 10/27/2016  . History of fracture of right hip 04/04/2019   07/01/19 Ortho, R hip incision healed, X-ray looks good, s/p IMN R hip fx, WBAT, f/u 6 mos.   Marland Kitchen HTN (hypertension) 10/27/2016  . Hypertension   . Hypertensive kidney disease with CKD (chronic kidney disease) stage V ([place]) 11/02/2016   11/02/16 Na 136, K 4.7, Bun 30, creat 1.52, TSH 0.86, wbc 6.7, Hgb 11.3, plt 264  . Hypothyroidism 10/27/2016  . Osteoarthritis 10/27/2016  . Thyroid disease    Past Surgical History:  Procedure Laterality Date  . ABDOMINAL HYSTERECTOMY  1986  . INTRAMEDULLARY (IM) NAIL INTERTROCHANTERIC Right 04/04/2019   Procedure: INTRAMEDULLARY (IM) NAIL INTERTROCHANTRIC;  Surgeon: Rod Can, MD;  Location: [facility];  Service: Orthopedics;  Laterality: Right;  . KNEE ARTHROSCOPY  2015/2017   Dr. Gustavus Bryant  . SPINE SURGERY  2009, 2011   Dr.Mark Nicoletta Dress  . TIBIA IM NAIL INSERTION Right 01/01/2020   Procedure: INTRAMEDULLARY (IM) NAIL TIBIAL;  Surgeon: Shona Needles, MD;  Location: [facility];  Service: Orthopedics;  Laterality: Right;    No Known Allergies  Allergies as of 10/21/2020   No Known Allergies     Medication List       Accurate as of October 21, 2020 11:59 PM. If you have any questions, ask your nurse or doctor.        acetaminophen 325  MG tablet Commonly known as: TYLENOL Take 650 mg by mouth 2 (two) times daily.   ascorbic acid 500 MG tablet Commonly known as: VITAMIN C Take 500 mg by mouth daily.   aspirin EC 81 MG tablet Take 81 mg by mouth daily.   cholecalciferol 25 MCG (1000 UNIT) tablet Commonly known as: VITAMIN D3 Take 2,000 Units by mouth daily.   clonazePAM 0.5 MG tablet Commonly known as: KLONOPIN Take 0.5 mg by mouth  every 8 (eight) hours as needed for anxiety. What changed: Another medication with the same name was removed. Continue taking this medication, and follow the directions you see here. Changed by: Simren Popson X Reilley Latorre, NP   divalproex 125 MG DR tablet Commonly known as: DEPAKOTE Take 250 mg by mouth in the morning and at bedtime.   docusate sodium 100 MG capsule Commonly known as: COLACE Take 1 capsule (100 mg total) by mouth 2 (two) times daily.   DULoxetine 30 MG capsule Commonly known as: CYMBALTA Take 30 mg by mouth daily.   furosemide 20 MG tablet Commonly known as: LASIX Take 40 mg by mouth daily.   levothyroxine 25 MCG tablet Commonly known as: SYNTHROID Take 25 mcg by mouth daily before breakfast.   lisinopril 10 MG tablet Commonly known as: ZESTRIL Take 10 mg by mouth daily.   memantine 10 MG tablet Commonly known as: NAMENDA Take 10 mg by mouth 2 (two) times daily.   ondansetron 4 MG disintegrating tablet Commonly known as: ZOFRAN-ODT Take 4 mg by mouth every 8 (eight) hours as needed for nausea or vomiting.   polyethylene glycol 17 g packet Commonly known as: MIRALAX / GLYCOLAX Take 17 g by mouth daily.   potassium chloride 10 MEQ CR capsule Commonly known as: MICRO-K Take 10 mEq by mouth daily.   potassium chloride SA 20 MEQ tablet Commonly known as: KLOR-CON Take 20 mEq by mouth daily.   risperiDONE 2 MG tablet Commonly known as: RISPERDAL Take 2 mg by mouth at bedtime.   traMADol 50 MG tablet Commonly known as: ULTRAM Take 0.5 tablets (25 mg total) by mouth every 6 (six) hours as needed.   vitamin B-12 1000 MCG tablet Commonly known as: CYANOCOBALAMIN Take 1,000 mcg by mouth. Once a day on Monday and Wednesday       Review of Systems  Constitutional: Negative for fatigue, fever and unexpected weight change.  HENT: Positive for hearing loss. Negative for congestion and voice change.   Eyes: Negative for visual disturbance.  Respiratory: Negative for  cough and shortness of breath.   Cardiovascular: Positive for leg swelling.  Gastrointestinal: Negative for abdominal pain and constipation.       Hx of rectocele.   Genitourinary: Negative for dysuria, frequency and urgency.  Musculoskeletal: Positive for arthralgias and gait problem.       S/p R lower leg IM tibia  Skin: Negative for color change.  Neurological: Negative for speech difficulty, weakness, light-headedness and headaches.       Dementia, lethargic.   Psychiatric/Behavioral: Positive for agitation, behavioral problems, confusion and sleep disturbance. The patient is nervous/anxious.        Improving.     Immunization History  Administered Date(s) Administered  . Influenza-Unspecified 05/29/2017, 05/20/2020  . Moderna Sars-Covid-2 Vaccination 09/07/2019, 10/05/2019, 06/16/2020  . Pneumococcal Conjugate-13 06/21/2017  . Pneumococcal Polysaccharide-23 07/16/2018  . Tdap 06/21/2017   Pertinent  Health Maintenance Due  Topic Date Due  . DEXA SCAN  Completed  . PNA vac Low Risk  Adult  Completed  . INFLUENZA VACCINE  Discontinued   Fall Risk  04/27/2018 04/25/2017  Falls in the past year? No No   Functional Status Survey:    Vitals:   10/21/20 1602  BP: (!) 94/58  Pulse: (!) 102  Resp: 18  Temp: 98.4 F (36.9 C)  SpO2: 95%  Weight: 151 lb 12.8 oz (68.9 kg)  Height: 4\' 11"  (1.499 m)   Body mass index is 30.66 kg/m. Physical Exam Vitals and nursing note reviewed.  Constitutional:      Appearance: Normal appearance.  HENT:     Head: Normocephalic and atraumatic.     Mouth/Throat:     Mouth: Mucous membranes are moist.  Eyes:     Extraocular Movements: Extraocular movements intact.     Conjunctiva/sclera: Conjunctivae normal.     Pupils: Pupils are equal, round, and reactive to light.  Cardiovascular:     Rate and Rhythm: Normal rate and regular rhythm.     Heart sounds: No murmur heard.     Comments: DP pulses present R+L Pulmonary:     Effort:  Pulmonary effort is normal.     Breath sounds: No rales.  Abdominal:     General: Bowel sounds are normal.     Palpations: Abdomen is soft.     Tenderness: There is no abdominal tenderness.  Musculoskeletal:     Right lower leg: Edema present.     Left lower leg: Edema present.     Comments: R s/p IM rod for tibia fx. trace edema BLE. Left facial dependent edema from sleeping on the left sides of her face  Skin:    General: Skin is warm and dry.  Neurological:     General: No focal deficit present.     Mental Status: She is alert.     Gait: Gait abnormal.  Psychiatric:        Mood and Affect: Mood normal.     Comments: Followed simple directions during today's examination, but confused.      Labs reviewed: Recent Labs    01/09/20 0425 01/10/20 0430 01/11/20 0306 01/15/20 0000 04/10/20 0000 05/15/20 0000 06/03/20 0000  NA 140 140 140   < > 141 142 139  K 3.8 3.8 3.6   < > 4.2 5.1 4.9  CL 108 107 110   < > 105 107 102  CO2 23 23 21*   < > 26* 25* 28*  GLUCOSE 103* 90 88  --   --   --   --   BUN 27* 19 14   < > 24* 33* 25*  CREATININE 0.98 0.85 0.74   < > 1.0 1.3* 1.3*  CALCIUM 8.8* 8.6* 8.6*   < > 10.4 10.5 10.5   < > = values in this interval not displayed.   Recent Labs    01/08/20 1448 01/09/20 0425 01/11/20 0306 03/12/20 0000 04/10/20 0000  AST 14* 11* 11* 9* 10*  ALT 10 9 9  6* 7  ALKPHOS 58 49 62 107 98  BILITOT 0.7 0.6 0.8  --   --   PROT 5.9* 5.4* 5.4*  --   --   ALBUMIN 2.6* 2.5* 2.5* 3.5 3.8   Recent Labs    01/08/20 1448 01/08/20 2035 01/09/20 0425 01/10/20 0430 01/11/20 0306 01/21/20 0000 03/12/20 0000 04/10/20 0000  WBC 15.8*   < > 12.6* 15.1* 12.1* 9.0 7.2 7.9  NEUTROABS 12.2*  --   --   --   --   --  3,902 4,487  HGB 8.4*   < > 6.9* 9.9* 10.8* 11.5* 11.0* 11.2*  HCT 27.4*   < > 22.8* 29.8* 34.0* 35* 34* 34*  MCV 97.5   < > 98.7 87.9 91.2  --   --   --   PLT 260   < > 207 217 266 389 234 238   < > = values in this interval not  displayed.   Lab Results  Component Value Date   TSH 1.58 03/12/2020   Lab Results  Component Value Date   HGBA1C 5.3 11/01/2016   No results found for: CHOL, HDL, LDLCALC, LDLDIRECT, TRIG, CHOLHDL  Significant Diagnostic Results in last 30 days:  No results found.  Assessment/Plan  Essential hypertension Blood pressure runs low, may consider decrease  Lisinopril to 5mg  qd if persisted low Bp, continue Furosemide, ASA   Hypothyroidism TSH wnl 03/2020, takes Levothyroxine.    Generalized osteoarthritis of multiple sites Multiple sites, prn Tylenol, Tramadol available to her.    Depression, recurrent ([place]) Stabilizing depression/anxiety: takes Risperdal 2mg qhs, Depakote, Duloxetine, Clonazepam.  Mixed Alzheimer's and vascular dementia ([place]) impulsive, no safety awareness, takes Memantine, resides in SNF [facility]   Rectocele Rectocele avoid constipation   Slow transit constipation takes Colace, MiraLax.   Dependent edema dependent, usually seen in legs when sitting up too long, takes Furosemide.Bun/creat 25/1.3 06/03/20   Family/ staff Communication: plan of care reviewed with the patient and charge nurse.   Labs/tests ordered:  none  Time spend 35 minutes

## 2020-10-21 NOTE — Assessment & Plan Note (Signed)
Multiple sites, prn Tylenol, Tramadol available to her.  

## 2020-10-21 NOTE — Assessment & Plan Note (Signed)
takes Colace, MiraLax 

## 2020-10-23 ENCOUNTER — Encounter: Payer: Self-pay | Admitting: Nurse Practitioner

## 2020-11-05 ENCOUNTER — Other Ambulatory Visit: Payer: Self-pay

## 2020-11-05 NOTE — Telephone Encounter (Signed)
Manxie Mast, NP signed a rx on site at [facility], rx was sent to [place] via fax to document

## 2020-11-09 ENCOUNTER — Non-Acute Institutional Stay (SKILLED_NURSING_FACILITY): Payer: Medicare Other | Admitting: Nurse Practitioner

## 2020-11-09 DIAGNOSIS — F419 Anxiety disorder, unspecified: Secondary | ICD-10-CM

## 2020-11-09 DIAGNOSIS — N816 Rectocele: Secondary | ICD-10-CM | POA: Diagnosis not present

## 2020-11-09 DIAGNOSIS — R609 Edema, unspecified: Secondary | ICD-10-CM | POA: Diagnosis not present

## 2020-11-09 DIAGNOSIS — L89610 Pressure ulcer of right heel, unstageable: Secondary | ICD-10-CM | POA: Insufficient documentation

## 2020-11-09 DIAGNOSIS — E039 Hypothyroidism, unspecified: Secondary | ICD-10-CM

## 2020-11-09 DIAGNOSIS — L89620 Pressure ulcer of left heel, unstageable: Secondary | ICD-10-CM | POA: Insufficient documentation

## 2020-11-09 DIAGNOSIS — F32A Depression, unspecified: Secondary | ICD-10-CM

## 2020-11-09 DIAGNOSIS — K5901 Slow transit constipation: Secondary | ICD-10-CM | POA: Diagnosis not present

## 2020-11-09 DIAGNOSIS — L89600 Pressure ulcer of unspecified heel, unstageable: Secondary | ICD-10-CM | POA: Insufficient documentation

## 2020-11-09 DIAGNOSIS — F0150 Vascular dementia without behavioral disturbance: Secondary | ICD-10-CM

## 2020-11-09 DIAGNOSIS — F0280 Dementia in other diseases classified elsewhere without behavioral disturbance: Secondary | ICD-10-CM

## 2020-11-09 DIAGNOSIS — G309 Alzheimer's disease, unspecified: Secondary | ICD-10-CM

## 2020-11-09 DIAGNOSIS — I10 Essential (primary) hypertension: Secondary | ICD-10-CM

## 2020-11-09 DIAGNOSIS — M159 Polyosteoarthritis, unspecified: Secondary | ICD-10-CM

## 2020-11-09 NOTE — Assessment & Plan Note (Signed)
TSH wnl 03/2020, takes Levothyroxine.

## 2020-11-09 NOTE — Progress Notes (Signed)
Location:   SNF [place] Room Number: 64 Place of Service:  SNF (31) Provider: [facility] Yashira Offenberger NP  [name], MD  Patient Care Team: [name], MD as PCP - General (Internal Medicine) Taia Bramlett X, NP as Nurse Practitioner (Internal Medicine) [name], MD (Internal Medicine)  Extended Emergency Contact Information Primary Emergency Contact: [facility] Address: [address]          [place], [place] [address] [name] of [place] Phone: [phone] Relation: Son Secondary Emergency Contact: [facility] Address: [address] [place] [address] [name] Phone: [phone] Relation: Daughter  Code Status: DNR Goals of care: Advanced Directive information Advanced Directives 11/10/2020  Does Patient Have a Medical Advance Directive? Yes  Type of Advance Directive Out of facility DNR (pink MOST or yellow form)  Does patient want to make changes to medical advance directive? No - Patient declined  Copy of [place] in Chart? -  Would patient like information on creating a medical advance directive? -  Pre-existing out of facility DNR order (yellow form or pink MOST form) Yellow form placed in chart (order not valid for inpatient use)     Chief Complaint  Patient presents with  . Acute Visit    Pressure ulcers R+L heel    HPI:  Pt is a 87 y.o. female seen today for an acute visit for R+L heel a quarter sized pressure wounds with yellow slough.  Edema, dependent, usually seen in legs when sitting up too long, takes Furosemide.Bun/creat 25/1.3 06/03/20 Constipation, takes Colace, MiraLax.  Rectocele avoid constipation Dementia, impulsive, no safety awareness, takes Memantine, resides in SNF [facility] Depression/anxiety: takes Risperdal 2mg qhs, Depakote, Duloxetine, Clonazepam. HTN, takes Lisinopril, Furosemide,  ASA Hypothyroidism, TSH wnl 03/2020, takes Levothyroxine.  OA,Multiple sites, prn Tylenol, Tramadol available to her.    Past Medical History:  Diagnosis Date  . Alzheimer disease ([place]) 10/27/2016  . Arthritis   . Confusion 04/04/2019  . Depression, recurrent ([place]) 10/27/2016  . History of fracture of right hip 04/04/2019   07/01/19 Ortho, R hip incision healed, X-ray looks good, s/p IMN R hip fx, WBAT, f/u 6 mos.   Marland Kitchen HTN (hypertension) 10/27/2016  . Hypertension   . Hypertensive kidney disease with CKD (chronic kidney disease) stage V ([place]) 11/02/2016   11/02/16 Na 136, K 4.7, Bun 30, creat 1.52, TSH 0.86, wbc 6.7, Hgb 11.3, plt 264  . Hypothyroidism 10/27/2016  . Osteoarthritis 10/27/2016  . Thyroid disease    Past Surgical History:  Procedure Laterality Date  . ABDOMINAL HYSTERECTOMY  1986  . INTRAMEDULLARY (IM) NAIL INTERTROCHANTERIC Right 04/04/2019   Procedure: INTRAMEDULLARY (IM) NAIL INTERTROCHANTRIC;  Surgeon: Rod Can, MD;  [facility]: [facility];  Service: Orthopedics;  Laterality: Right;  . KNEE ARTHROSCOPY  2015/2017   Dr. Gustavus Bryant  . SPINE SURGERY  2009, 2011   Dr.Mark Nicoletta Dress  . TIBIA IM NAIL INSERTION Right 01/01/2020   Procedure: INTRAMEDULLARY (IM) NAIL TIBIAL;  Surgeon: Shona Needles, MD;  Location: [facility];  Service: Orthopedics;  Laterality: Right;    No Known Allergies  Allergies as of 11/09/2020   No Known Allergies     Medication List       Accurate as of November 09, 2020 11:59 PM. If you have any questions, ask your nurse or doctor.        acetaminophen 325 MG tablet Commonly known as: TYLENOL Take 650  mg by mouth 2 (two) times daily.   ascorbic acid 500 MG tablet Commonly known as: VITAMIN C Take 500 mg by mouth daily.   aspirin EC 81 MG tablet Take 81 mg by mouth daily.   cholecalciferol 25 MCG (1000 UNIT) tablet Commonly known as: VITAMIN D3 Take 2,000 Units by mouth daily.   clonazePAM 0.5 MG tablet Commonly  known as: KLONOPIN Take 1 tablet (0.5 mg total) by mouth every 8 (eight) hours as needed for anxiety (x 30 days).   divalproex 125 MG DR tablet Commonly known as: DEPAKOTE Take 250 mg by mouth in the morning and at bedtime.   docusate sodium 100 MG capsule Commonly known as: COLACE Take 1 capsule (100 mg total) by mouth 2 (two) times daily.   DULoxetine 30 MG capsule Commonly known as: CYMBALTA Take 30 mg by mouth daily.   furosemide 20 MG tablet Commonly known as: LASIX Take 40 mg by mouth daily.   levothyroxine 25 MCG tablet Commonly known as: SYNTHROID Take 25 mcg by mouth daily before breakfast.   lisinopril 10 MG tablet Commonly known as: ZESTRIL Take 10 mg by mouth daily.   memantine 10 MG tablet Commonly known as: NAMENDA Take 10 mg by mouth 2 (two) times daily.   ondansetron 4 MG disintegrating tablet Commonly known as: ZOFRAN-ODT Take 4 mg by mouth every 8 (eight) hours as needed for nausea or vomiting.   polyethylene glycol 17 g packet Commonly known as: MIRALAX / GLYCOLAX Take 17 g by mouth daily.   potassium chloride 10 MEQ CR capsule Commonly known as: MICRO-K Take 10 mEq by mouth daily.   potassium chloride SA 20 MEQ tablet Commonly known as: KLOR-CON Take 20 mEq by mouth daily.   risperiDONE 2 MG tablet Commonly known as: RISPERDAL Take 2 mg by mouth at bedtime.   traMADol 50 MG tablet Commonly known as: ULTRAM Take 0.5 tablets (25 mg total) by mouth every 6 (six) hours as needed.   vitamin B-12 1000 MCG tablet Commonly known as: CYANOCOBALAMIN Take 1,000 mcg by mouth. Once a day on Monday and Wednesday       Review of Systems  Constitutional: Negative for fatigue, fever and unexpected weight change.  HENT: Positive for hearing loss. Negative for congestion and voice change.   Eyes: Negative for visual disturbance.  Respiratory: Negative for cough and shortness of breath.   Cardiovascular: Positive for leg swelling.  Gastrointestinal:  Negative for abdominal pain and constipation.       Hx of rectocele.   Genitourinary: Negative for dysuria, frequency and urgency.  Musculoskeletal: Positive for arthralgias and gait problem.       S/p R lower leg IM tibia  Skin: Negative for color change.  Neurological: Negative for speech difficulty, weakness, light-headedness and headaches.       Dementia, lethargic.   Psychiatric/Behavioral: Positive for agitation, behavioral problems, confusion and sleep disturbance. The patient is nervous/anxious.        Improving.     Immunization History  Administered Date(s) Administered  . Influenza-Unspecified 05/29/2017, 05/20/2020  . Moderna Sars-Covid-2 Vaccination 09/07/2019, 10/05/2019, 06/16/2020  . Pneumococcal Conjugate-13 06/21/2017  . Pneumococcal Polysaccharide-23 07/16/2018  . Tdap 06/21/2017   Pertinent  Health Maintenance Due  Topic Date Due  . DEXA SCAN  Completed  . PNA vac Low Risk Adult  Completed  . INFLUENZA VACCINE  Discontinued   Fall Risk  04/27/2018 04/25/2017  Falls in the past year? No No   Functional Status Survey:  Vitals:   11/10/20 1143  BP: 124/70  Pulse: 77  Resp: 16  Temp: (!) 97.3 F (36.3 C)  SpO2: 98%   There is no height or weight on file to calculate BMI. Physical Exam Vitals and nursing note reviewed.  Constitutional:      Appearance: Normal appearance.  HENT:     Head: Normocephalic and atraumatic.     Mouth/Throat:     Mouth: Mucous membranes are moist.  Eyes:     Extraocular Movements: Extraocular movements intact.     Conjunctiva/sclera: Conjunctivae normal.     Pupils: Pupils are equal, round, and reactive to light.  Cardiovascular:     Rate and Rhythm: Normal rate and regular rhythm.     Heart sounds: No murmur heard.     Comments: DP pulses present R+L Pulmonary:     Effort: Pulmonary effort is normal.     Breath sounds: No rales.  Abdominal:     General: Bowel sounds are normal.     Palpations: Abdomen is soft.      Tenderness: There is no abdominal tenderness.  Musculoskeletal:     Right lower leg: Edema present.     Left lower leg: Edema present.     Comments: R s/p IM rod for tibia fx. trace edema BLE. Left facial dependent edema from sleeping on the left sides of her face  Skin:    General: Skin is warm and dry.     Comments: A quarter sized yellow slough covered pressure ulcers R+L heel, mild erythema peri wound, small amount serosanguinous drainage seen on dressing.    Neurological:     General: No focal deficit present.     Mental Status: She is alert.     Gait: Gait abnormal.  Psychiatric:        Mood and Affect: Mood normal.     Comments: Followed simple directions during today's examination, but confused.      Labs reviewed: Recent Labs    01/09/20 0425 01/10/20 0430 01/11/20 0306 01/15/20 0000 04/10/20 0000 05/15/20 0000 06/03/20 0000  NA 140 140 140   < > 141 142 139  K 3.8 3.8 3.6   < > 4.2 5.1 4.9  CL 108 107 110   < > 105 107 102  CO2 23 23 21*   < > 26* 25* 28*  GLUCOSE 103* 90 88  --   --   --   --   BUN 27* 19 14   < > 24* 33* 25*  CREATININE 0.98 0.85 0.74   < > 1.0 1.3* 1.3*  CALCIUM 8.8* 8.6* 8.6*   < > 10.4 10.5 10.5   < > = values in this interval not displayed.   Recent Labs    01/08/20 1448 01/09/20 0425 01/11/20 0306 03/12/20 0000 04/10/20 0000  AST 14* 11* 11* 9* 10*  ALT 10 9 9  6* 7  ALKPHOS 58 49 62 107 98  BILITOT 0.7 0.6 0.8  --   --   PROT 5.9* 5.4* 5.4*  --   --   ALBUMIN 2.6* 2.5* 2.5* 3.5 3.8   Recent Labs    01/08/20 1448 01/08/20 2035 01/09/20 0425 01/10/20 0430 01/11/20 0306 01/21/20 0000 03/12/20 0000 04/10/20 0000  WBC 15.8*   < > 12.6* 15.1* 12.1* 9.0 7.2 7.9  NEUTROABS 12.2*  --   --   --   --   --  3,902 4,487  HGB 8.4*   < > 6.9* 9.9*  10.8* 11.5* 11.0* 11.2*  HCT 27.4*   < > 22.8* 29.8* 34.0* 35* 34* 34*  MCV 97.5   < > 98.7 87.9 91.2  --   --   --   PLT 260   < > 207 217 266 389 234 238   < > = values in this  interval not displayed.   Lab Results  Component Value Date   TSH 1.58 03/12/2020   Lab Results  Component Value Date   HGBA1C 5.3 11/01/2016   No results found for: CHOL, HDL, LDLCALC, LDLDIRECT, TRIG, CHOLHDL  Significant Diagnostic Results in last 30 days:  No results found.  Assessment/Plan: Unstageable pressure ulcer of heel (HCC) R+L heel a quarter sized pressure wounds with yellow slough, apply Santyl, foam dressing, float heels.   Dependent edema Edema, dependent, usually seen in legs when sitting up too long, takes Furosemide.Bun/creat 25/1.3 06/03/20  Slow transit constipation takes Colace, MiraLax.   Rectocele avoid constipation   Mixed Alzheimer's and vascular dementia ([place]) impulsive, no safety awareness, takes Memantine, resides in SNF [facility]   Anxiety and depression takes Risperdal 2mg qhs, Depakote, Duloxetine, Clonazepam.  Essential hypertension takes Lisinopril, Furosemide, ASA  Hypothyroidism TSH wnl 03/2020, takes Levothyroxine.    Generalized osteoarthritis of multiple sites Multiple sites, prn Tylenol, Tramadol available to her.     Family/ staff Communication: plan of care reviewed with the patient and charge nurse.   Labs/tests ordered:  None  Time spend 25 minutes.

## 2020-11-09 NOTE — Assessment & Plan Note (Signed)
avoid constipation 

## 2020-11-09 NOTE — Assessment & Plan Note (Signed)
R+L heel a quarter sized pressure wounds with yellow slough, apply Santyl, foam dressing, float heels.

## 2020-11-09 NOTE — Assessment & Plan Note (Signed)
Edema, dependent, usually seen in legs when sitting up too long, takes Furosemide.Bun/creat 25/1.3 06/03/20

## 2020-11-09 NOTE — Assessment & Plan Note (Signed)
impulsive, no safety awareness, takes Memantine, resides in SNF [facility]

## 2020-11-09 NOTE — Assessment & Plan Note (Signed)
Multiple sites, prn Tylenol, Tramadol available to her.

## 2020-11-09 NOTE — Assessment & Plan Note (Signed)
takes Risperdal 2mg qhs, Depakote, Duloxetine, Clonazepam.

## 2020-11-09 NOTE — Assessment & Plan Note (Signed)
takes Colace, MiraLax 

## 2020-11-09 NOTE — Assessment & Plan Note (Signed)
takes Lisinopril, Furosemide, ASA

## 2020-11-10 ENCOUNTER — Encounter: Payer: Self-pay | Admitting: Internal Medicine

## 2020-11-10 ENCOUNTER — Non-Acute Institutional Stay (SKILLED_NURSING_FACILITY): Payer: Medicare Other | Admitting: Internal Medicine

## 2020-11-10 ENCOUNTER — Encounter: Payer: Self-pay | Admitting: Nurse Practitioner

## 2020-11-10 DIAGNOSIS — F32A Depression, unspecified: Secondary | ICD-10-CM

## 2020-11-10 DIAGNOSIS — R296 Repeated falls: Secondary | ICD-10-CM

## 2020-11-10 DIAGNOSIS — G309 Alzheimer's disease, unspecified: Secondary | ICD-10-CM

## 2020-11-10 DIAGNOSIS — I10 Essential (primary) hypertension: Secondary | ICD-10-CM

## 2020-11-10 DIAGNOSIS — L89620 Pressure ulcer of left heel, unstageable: Secondary | ICD-10-CM

## 2020-11-10 DIAGNOSIS — R600 Localized edema: Secondary | ICD-10-CM | POA: Diagnosis not present

## 2020-11-10 DIAGNOSIS — L89610 Pressure ulcer of right heel, unstageable: Secondary | ICD-10-CM

## 2020-11-10 DIAGNOSIS — N1831 Chronic kidney disease, stage 3a: Secondary | ICD-10-CM

## 2020-11-10 DIAGNOSIS — E039 Hypothyroidism, unspecified: Secondary | ICD-10-CM

## 2020-11-10 DIAGNOSIS — F419 Anxiety disorder, unspecified: Secondary | ICD-10-CM

## 2020-11-10 DIAGNOSIS — F0280 Dementia in other diseases classified elsewhere without behavioral disturbance: Secondary | ICD-10-CM

## 2020-11-10 DIAGNOSIS — F0150 Vascular dementia without behavioral disturbance: Secondary | ICD-10-CM

## 2020-11-10 NOTE — Progress Notes (Signed)
Location:    [place] Room Number: 42 Place of Service:  SNF [phone]) Provider:  Veleta Miners MD  [name], MD  Patient Care Team: [name], MD as PCP - General (Internal Medicine) Mast, Man X, NP as Nurse Practitioner (Internal Medicine) [name], MD (Internal Medicine)  Extended Emergency Contact Information Primary Emergency Contact: [facility] Address: [address]          [place],  [address] [name] of [place] Phone: [phone] Relation: Son Secondary Emergency Contact: [facility] Address: [address] [place] [address] [name] Phone: [phone] Relation: Daughter  Code Status:  DNR Goals of care: Advanced Directive information Advanced Directives 11/10/2020  Does Patient Have a Medical Advance Directive? Yes  Type of Advance Directive Out of facility DNR (pink MOST or yellow form)  Does patient want to make changes to medical advance directive? No - Patient declined  Copy of [place] in Chart? -  Would patient like information on creating a medical advance directive? -  Pre-existing out of facility DNR order (yellow form or pink MOST form) Yellow form placed in chart (order not valid for inpatient use)     Chief Complaint  Patient presents with  . Acute Visit    Family meeting  . Medical Management of Chronic Issues    HPI:  Pt is a 87 y.o. female seen today for medical management of chronic diseases.    [facility] resident Patienthas h/o Hypertension,  Hypothyroidism , Alzheimer's Dementia with behavior issues,  Depression, B12 def,  S/P Right Hip fracture with Intramedullary Fixation in 8/20 H/O Colitis in 06/21 and Right Tibia Fibula Fracture in 5/21 due to fall S/P IM Placement  Patient has a history of progressive dementia. Recently has been getting more weaker.  Sleeps a lot.  Continues to be normal here Is able  to feed herself Her weight is stable. She has developed to pressure wounds in her heel. Her daughter also wanted to talk about her prognosis  Past Medical History:  Diagnosis Date  . Alzheimer disease ([place]) 10/27/2016  . Arthritis   . Confusion 04/04/2019  . Depression, recurrent ([place]) 10/27/2016  . History of fracture of right hip 04/04/2019   07/01/19 Ortho, R hip incision healed, X-ray looks good, s/p IMN R hip fx, WBAT, f/u 6 mos.   Marland Kitchen HTN (hypertension) 10/27/2016  . Hypertension   . Hypertensive kidney disease with CKD (chronic kidney disease) stage V ([place]) 11/02/2016   11/02/16 Na 136, K 4.7, Bun 30, creat 1.52, TSH 0.86, wbc 6.7, Hgb 11.3, plt 264  . Hypothyroidism 10/27/2016  . Osteoarthritis 10/27/2016  . Thyroid disease    Past Surgical History:  Procedure Laterality Date  . ABDOMINAL HYSTERECTOMY  1986  . INTRAMEDULLARY (IM) NAIL INTERTROCHANTERIC Right 04/04/2019   Procedure: INTRAMEDULLARY (IM) NAIL INTERTROCHANTRIC;  Surgeon: Rod Can, MD;  Location: [facility];  Service: Orthopedics;  Laterality: Right;  . KNEE ARTHROSCOPY  2015/2017   Dr. Gustavus Bryant  . SPINE SURGERY  2009, 2011   Dr.Mark Nicoletta Dress  . TIBIA IM NAIL INSERTION Right 01/01/2020   Procedure: INTRAMEDULLARY (IM) NAIL TIBIAL;  Surgeon: Shona Needles, MD;  Location: [facility];  Service: Orthopedics;  Laterality: Right;    No Known Allergies  Allergies as of 11/10/2020   No Known Allergies     Medication List  Accurate as of November 10, 2020  9:50 AM. If you have any questions, ask your nurse or doctor.        STOP taking these medications   clonazePAM 0.5 MG tablet Commonly known as: KLONOPIN Stopped by: [name], MD   docusate sodium 100 MG capsule Commonly known as: COLACE Stopped by: [name], MD     TAKE these medications   acetaminophen 325 MG tablet Commonly known as: TYLENOL Take 650 mg by mouth 2 (two) times daily.   ascorbic acid 500 MG tablet Commonly known as: VITAMIN  C Take 500 mg by mouth daily.   aspirin EC 81 MG tablet Take 81 mg by mouth daily.   cholecalciferol 25 MCG (1000 UNIT) tablet Commonly known as: VITAMIN D3 Take 2,000 Units by mouth daily.   collagenase ointment Commonly known as: SANTYL Apply 1 application topically daily.   divalproex 125 MG DR tablet Commonly known as: DEPAKOTE Take 250 mg by mouth in the morning and at bedtime.   DULoxetine 30 MG capsule Commonly known as: CYMBALTA Take 30 mg by mouth daily.   furosemide 20 MG tablet Commonly known as: LASIX Take 40 mg by mouth daily.   levothyroxine 25 MCG tablet Commonly known as: SYNTHROID Take 25 mcg by mouth daily before breakfast.   lisinopril 10 MG tablet Commonly known as: ZESTRIL Take 10 mg by mouth daily.   memantine 10 MG tablet Commonly known as: NAMENDA Take 10 mg by mouth 2 (two) times daily.   ondansetron 4 MG disintegrating tablet Commonly known as: ZOFRAN-ODT Take 4 mg by mouth every 8 (eight) hours as needed for nausea or vomiting.   polyethylene glycol 17 g packet Commonly known as: MIRALAX / GLYCOLAX Take 17 g by mouth daily.   potassium chloride 10 MEQ CR capsule Commonly known as: MICRO-K Take 10 mEq by mouth daily.   potassium chloride SA 20 MEQ tablet Commonly known as: KLOR-CON Take 20 mEq by mouth daily.   risperiDONE 2 MG tablet Commonly known as: RISPERDAL Take 2 mg by mouth at bedtime.   traMADol 50 MG tablet Commonly known as: ULTRAM Take 0.5 tablets (25 mg total) by mouth every 6 (six) hours as needed.   vitamin B-12 1000 MCG tablet Commonly known as: CYANOCOBALAMIN Take 1,000 mcg by mouth. Once a day on Monday and Wednesday       Review of Systems  Unable to perform ROS: Dementia    Immunization History  Administered Date(s) Administered  . Influenza-Unspecified 05/29/2017, 05/20/2020  . Moderna Sars-Covid-2 Vaccination 09/07/2019, 10/05/2019, 06/16/2020  . Pneumococcal Conjugate-13 06/21/2017  .  Pneumococcal Polysaccharide-23 07/16/2018  . Tdap 06/21/2017   Pertinent  Health Maintenance Due  Topic Date Due  . DEXA SCAN  Completed  . PNA vac Low Risk Adult  Completed  . INFLUENZA VACCINE  Discontinued   Fall Risk  04/27/2018 04/25/2017  Falls in the past year? No No   Functional Status Survey:    Vitals:   11/10/20 0941  BP: 124/70  Pulse: 77  Resp: 16  Temp: (!) 97.3 F (36.3 C)  SpO2: 95%  Weight: 150 lb 6.4 oz (68.2 kg)  Height: 4\' 11"  (1.499 m)   Body mass index is 30.38 kg/m. Physical Exam  Constitutional:  Well-developed and well-nourished.  HENT:  Head: Normocephalic.  Mouth/Throat: Oropharynx is clear and moist.  Eyes: Pupils are equal, round, and reactive to light.  Neck: Neck supple.  Cardiovascular: Normal rate and normal heart sounds.  No murmur heard. Pulmonary/Chest: Effort normal and breath sounds normal. No respiratory distress. No wheezes. She has no rales.  Abdominal: Soft. Bowel sounds are normal. No distension. There is no tenderness. There is no rebound.  Musculoskeletal:  Edema Bilateral Has 2 PU 3x 2.5 cm Bilateral Heels Both are unstageable  Lymphadenopathy: none Neurological: Alert and responds to her Name but Denies any acute issues Mostly stays in her Wheelchair Has been sleeping Alot Skin: Skin is warm and dry.  Psychiatric: Normal mood and affect. Behavior is normal. Thought content normal.   Labs reviewed: Recent Labs    01/09/20 0425 01/10/20 0430 01/11/20 0306 01/15/20 0000 04/10/20 0000 05/15/20 0000 06/03/20 0000  NA 140 140 140   < > 141 142 139  K 3.8 3.8 3.6   < > 4.2 5.1 4.9  CL 108 107 110   < > 105 107 102  CO2 23 23 21*   < > 26* 25* 28*  GLUCOSE 103* 90 88  --   --   --   --   BUN 27* 19 14   < > 24* 33* 25*  CREATININE 0.98 0.85 0.74   < > 1.0 1.3* 1.3*  CALCIUM 8.8* 8.6* 8.6*   < > 10.4 10.5 10.5   < > = values in this interval not displayed.   Recent Labs    01/08/20 1448 01/09/20 0425  01/11/20 0306 03/12/20 0000 04/10/20 0000  AST 14* 11* 11* 9* 10*  ALT 10 9 9  6* 7  ALKPHOS 58 49 62 107 98  BILITOT 0.7 0.6 0.8  --   --   PROT 5.9* 5.4* 5.4*  --   --   ALBUMIN 2.6* 2.5* 2.5* 3.5 3.8   Recent Labs    01/08/20 1448 01/08/20 2035 01/09/20 0425 01/10/20 0430 01/11/20 0306 01/21/20 0000 03/12/20 0000 04/10/20 0000  WBC 15.8*   < > 12.6* 15.1* 12.1* 9.0 7.2 7.9  NEUTROABS 12.2*  --   --   --   --   --  3,902 4,487  HGB 8.4*   < > 6.9* 9.9* 10.8* 11.5* 11.0* 11.2*  HCT 27.4*   < > 22.8* 29.8* 34.0* 35* 34* 34*  MCV 97.5   < > 98.7 87.9 91.2  --   --   --   PLT 260   < > 207 217 266 389 234 238   < > = values in this interval not displayed.   Lab Results  Component Value Date   TSH 1.58 03/12/2020   Lab Results  Component Value Date   HGBA1C 5.3 11/01/2016   No results found for: CHOL, HDL, LDLCALC, LDLDIRECT, TRIG, CHOLHDL  Significant Diagnostic Results in last 30 days:  No results found.  Assessment/Plan  Mixed Alzheimer's and vascular dementia [facility]) Discussed with the daughter in details She wants her Mom to be Comfortable No Aggressive Treatment Hospice Referal made Discontinue Namenda  Pressure injury of both heels, unstageable ([place]) Santy with Foam Dressing Wearing Boots to Reduce Pressure  Bilateral leg edema On Lasix And Potassium Essential hypertension Stable on Lisinopril Hypothyroidism, unspecified type Continue Synthyroid Anxiety and depression ON Cymbalta Reduce Risperdal to 1 mg QHS Continue Depakote for now Recurrent falls Supportive care Chronic kidney disease, stage 3a ([place]) Repeat BMP Rectocele Avoid Constipation  Family/ staff Communication:   Labs/tests ordered:  CBC,CMP,TSH,  Total time spent in this patient care encounter was 45 _  minutes; greater than 50% of the visit spent counseling patient  daughter and staff, reviewing records , Labs and coordinating care for problems addressed at this encounter.

## 2020-11-13 LAB — COMPREHENSIVE METABOLIC PANEL
Albumin: 3.6 (ref 3.5–5.0)
Calcium: 10 (ref 8.7–10.7)
GFR calc Af Amer: 60
GFR calc non Af Amer: 52
Globulin: 2.8

## 2020-11-13 LAB — HEPATIC FUNCTION PANEL
ALT: 7 (ref 7–35)
AST: 9 — AB (ref 13–35)
Alkaline Phosphatase: 70 (ref 25–125)
Bilirubin, Total: 0.2

## 2020-11-13 LAB — BASIC METABOLIC PANEL
BUN: 20 (ref 4–21)
CO2: 27 — AB (ref 13–22)
Chloride: 103 (ref 99–108)
Creatinine: 1 (ref 0.5–1.1)
Glucose: 78
Potassium: 4.5 (ref 3.4–5.3)
Sodium: 139 (ref 137–147)

## 2020-11-13 LAB — CBC AND DIFFERENTIAL
HCT: 34 — AB (ref 36–46)
Hemoglobin: 11 — AB (ref 12.0–16.0)
Platelets: 288 (ref 150–399)
WBC: 8.2

## 2020-11-13 LAB — CBC: RBC: 3.79 — AB (ref 3.87–5.11)

## 2020-11-13 LAB — TSH: TSH: 2.32 (ref 0.41–5.90)

## 2020-12-04 ENCOUNTER — Encounter: Payer: Self-pay | Admitting: Nurse Practitioner

## 2020-12-04 NOTE — Progress Notes (Signed)
This encounter was created in error - please disregard.

## 2020-12-16 ENCOUNTER — Non-Acute Institutional Stay (SKILLED_NURSING_FACILITY): Payer: Medicare Other | Admitting: Nurse Practitioner

## 2020-12-16 ENCOUNTER — Encounter: Payer: Self-pay | Admitting: Nurse Practitioner

## 2020-12-16 DIAGNOSIS — F419 Anxiety disorder, unspecified: Secondary | ICD-10-CM | POA: Diagnosis not present

## 2020-12-16 DIAGNOSIS — R609 Edema, unspecified: Secondary | ICD-10-CM

## 2020-12-16 DIAGNOSIS — M159 Polyosteoarthritis, unspecified: Secondary | ICD-10-CM | POA: Diagnosis not present

## 2020-12-16 DIAGNOSIS — F0150 Vascular dementia without behavioral disturbance: Secondary | ICD-10-CM

## 2020-12-16 DIAGNOSIS — I10 Essential (primary) hypertension: Secondary | ICD-10-CM | POA: Diagnosis not present

## 2020-12-16 DIAGNOSIS — G309 Alzheimer's disease, unspecified: Secondary | ICD-10-CM

## 2020-12-16 DIAGNOSIS — N816 Rectocele: Secondary | ICD-10-CM

## 2020-12-16 DIAGNOSIS — E039 Hypothyroidism, unspecified: Secondary | ICD-10-CM | POA: Diagnosis not present

## 2020-12-16 DIAGNOSIS — F0280 Dementia in other diseases classified elsewhere without behavioral disturbance: Secondary | ICD-10-CM

## 2020-12-16 DIAGNOSIS — K5901 Slow transit constipation: Secondary | ICD-10-CM

## 2020-12-16 DIAGNOSIS — F32A Depression, unspecified: Secondary | ICD-10-CM

## 2020-12-16 NOTE — Assessment & Plan Note (Signed)
avoid constipation 

## 2020-12-16 NOTE — Assessment & Plan Note (Signed)
Blood pressure is controlled, takes Lisinopril, Furosemide, ASA  Bun/creat 20/1.0 11/13/20  

## 2020-12-16 NOTE — Assessment & Plan Note (Signed)
Stable, continue Colace, MiraLax.  

## 2020-12-16 NOTE — Assessment & Plan Note (Signed)
Multiple sites, Tylenol bid,  Tramadol bid/prn available to her.  

## 2020-12-16 NOTE — Progress Notes (Signed)
Location:   [place] Room Number: [phone] Place of Service:  SNF (31) Provider:  Broc Caspers Otho Darner, NP    Patient Care Team: [name], MD as PCP - General (Internal Medicine) Derrisha Foos X, NP as Nurse Practitioner (Internal Medicine) [name], MD (Internal Medicine)  Extended Emergency Contact Information Primary Emergency Contact: [facility] Address: [address]          [place], [place] [address] [name] of [place] Phone: [phone] Relation: Son Secondary Emergency Contact: [facility] Address: [address]          [place] [address] [name] Phone: [phone] Relation: Daughter  Code Status: DNR   Goals of care: Advanced Directive information Advanced Directives 12/16/2020  Does Patient Have a Medical Advance Directive? Yes  Type of Paramedic of [place];Out of facility DNR (pink MOST or yellow form)  Does patient want to make changes to medical advance directive? No - Patient declined  Copy of [place] in Chart? Yes - validated most recent copy scanned in chart (See row information)  Would patient like information on creating a medical advance directive? -  Pre-existing out of facility DNR order (yellow form or pink MOST form) Yellow form placed in chart (order not valid for inpatient use);Pink MOST form placed in chart (order not valid for inpatient use)     Chief Complaint  Patient presents with  . Medical Management of Chronic Issues    Routine follow up.    HPI:  Pt is a 87 y.o. female seen today for medical management of chronic diseases.    Edema, dependent, usually seen in legs when sitting up too long, takes Furosemide.Bun/creat 20/1.0, eGFR 52 11/13/20 Constipation, takes Colace, MiraLax.  Rectocele avoid constipation Dementia, impulsive, no safety awareness, off  Memantine, resides in SNF  [facility] Depression/anxiety: takes Risperdal 18mqhs, Depakote, Duloxetine, Clonazepam prn, TSH 2.32 11/13/20 HTN, takes Lisinopril, Furosemide, ASABun/creat 20/1.0 11/13/20 Hypothyroidism, takes Levothyroxine. TSH 2.32 11/13/20 OA,Multiple sites, Tylenol bid,  Tramadol bid/prn available to her.    Past Medical History:  Diagnosis Date  . Alzheimer disease ([place] 10/27/2016  . Arthritis   . Confusion 04/04/2019  . Depression, recurrent ([place] 10/27/2016  . History of fracture of right hip 04/04/2019   07/01/19 Ortho, R hip incision healed, X-ray looks good, s/p IMN R hip fx, WBAT, f/u 6 mos.   .Marland KitchenHTN (hypertension) 10/27/2016  . Hypertension   . Hypertensive kidney disease with CKD (chronic kidney disease) stage V ([place] 11/02/2016   11/02/16 Na 136, K 4.7, Bun 30, creat 1.52, TSH 0.86, wbc 6.7, Hgb 11.3, plt 264  . Hypothyroidism 10/27/2016  . Osteoarthritis 10/27/2016  . Thyroid disease    Past Surgical History:  Procedure Laterality Date  . ABDOMINAL HYSTERECTOMY  1986  . INTRAMEDULLARY (IM) NAIL INTERTROCHANTERIC Right 04/04/2019   Procedure: INTRAMEDULLARY (IM) NAIL INTERTROCHANTRIC;  Surgeon: SRod Can MD;  [facility];  Service: Orthopedics;  Laterality: Right;  . KNEE ARTHROSCOPY  2015/2017   Dr. LGustavus Bryant . SPINE SURGERY  2009, 2011   Dr.Mark LNicoletta Dress . TIBIA IM NAIL INSERTION Right 01/01/2020   Procedure: INTRAMEDULLARY (IM) NAIL TIBIAL;  Surgeon: HShona Needles MD;  [place]  Service: Orthopedics;  Laterality: Right;    No Known Allergies  Allergies as of 12/16/2020   No Known Allergies     Medication List       Accurate as of  Dec 16, 2020  4:32 PM. If you have any questions, ask your nurse or doctor.        STOP taking these medications   ascorbic acid 500 MG tablet Commonly known as: VITAMIN C Stopped by: Kymberlie Brazeau X Lorayne Getchell, NP   lisinopril 10 MG tablet Commonly known as: ZESTRIL Stopped by: Lavaughn Bisig X Macallan Ord,  NP   vitamin B-12 1000 MCG tablet Commonly known as: CYANOCOBALAMIN Stopped by: [place] Marney X Azai Gaffin, NP     TAKE these medications   acetaminophen 325 MG tablet Commonly known as: TYLENOL Take 650 mg by mouth 2 (two) times daily.   aspirin 81 MG chewable tablet Chew 81 mg by mouth daily.   cholecalciferol 25 MCG (1000 UNIT) tablet Commonly known as: VITAMIN D3 Take 2,000 Units by mouth daily.   clonazePAM 0.5 MG tablet Commonly known as: KLONOPIN Take 0.5 mg by mouth every 6 (six) hours as needed for anxiety.   collagenase ointment Commonly known as: SANTYL Apply 1 application topically daily.   divalproex 125 MG DR tablet Commonly known as: DEPAKOTE Take 250 mg by mouth at bedtime.   DULoxetine 30 MG capsule Commonly known as: CYMBALTA Take 30 mg by mouth daily.   feeding supplement (PRO-STAT SUGAR FREE 64) Liqd Take 30 mLs by mouth daily. Mix with 4oz of juice   furosemide 20 MG tablet Commonly known as: LASIX Take 40 mg by mouth daily.   levothyroxine 25 MCG tablet Commonly known as: SYNTHROID Take 25 mcg by mouth daily before breakfast.   ondansetron 4 MG disintegrating tablet Commonly known as: ZOFRAN-ODT Take 4 mg by mouth every 8 (eight) hours as needed for nausea or vomiting.   polyethylene glycol 17 g packet Commonly known as: MIRALAX / GLYCOLAX Take 17 g by mouth daily.   potassium chloride 10 MEQ CR capsule Commonly known as: MICRO-K Take 10 mEq by mouth daily.   potassium chloride SA 20 MEQ tablet Commonly known as: KLOR-CON Take 20 mEq by mouth daily.   risperiDONE 2 MG tablet Commonly known as: RISPERDAL Take 1 mg by mouth at bedtime.   traMADol 50 MG tablet Commonly known as: ULTRAM Take 0.5 tablets (25 mg total) by mouth every 6 (six) hours as needed.   traMADol 50 MG tablet Commonly known as: ULTRAM Take 25 mg by mouth 2 (two) times daily.       Review of Systems  Constitutional: Negative for activity change, fever and unexpected  weight change.  HENT: Positive for hearing loss. Negative for congestion and voice change.   Eyes: Negative for visual disturbance.  Respiratory: Negative for cough and shortness of breath.   Cardiovascular: Positive for leg swelling.  Gastrointestinal: Negative for abdominal pain and constipation.       Hx of rectocele.   Genitourinary: Negative for dysuria, frequency and urgency.  Musculoskeletal: Positive for arthralgias and gait problem.       S/p R lower leg IM tibia  Skin: Negative for color change.  Neurological: Negative for dizziness, speech difficulty and weakness.       Dementia, lethargic.   Psychiatric/Behavioral: Positive for agitation, behavioral problems, confusion and sleep disturbance. The patient is nervous/anxious.        Improving.     Immunization History  Administered Date(s) Administered  . Influenza-Unspecified 05/29/2017, 05/20/2020  . Moderna Sars-Covid-2 Vaccination 09/07/2019, 10/05/2019, 06/16/2020  . Pneumococcal Conjugate-13 06/21/2017  . Pneumococcal Polysaccharide-23 07/16/2018  . Tdap 06/21/2017   Pertinent  Health Maintenance Due  Topic Date Due  .  DEXA SCAN  Completed  . PNA vac Low Risk Adult  Completed  . INFLUENZA VACCINE  Discontinued   Fall Risk  04/27/2018 04/25/2017  Falls in the past year? No No   Functional Status Survey:    Vitals:   12/16/20 1543  BP: 137/76  Pulse: 80  Resp: 20  Temp: 97.6 F (36.4 C)  SpO2: 96%  Weight: 150 lb 8 oz (68.3 kg)  Height: _0  (1.499 m)   Body mass index is 30.4 kg/m. Physical Exam Vitals and nursing note reviewed.  Constitutional:      Appearance: Normal appearance.  HENT:     Head: Normocephalic and atraumatic.     Mouth/Throat:     Mouth: Mucous membranes are moist.  Eyes:     Extraocular Movements: Extraocular movements intact.     Conjunctiva/sclera: Conjunctivae normal.     Pupils: Pupils are equal, round, and reactive to light.  Cardiovascular:     Rate and Rhythm:  Normal rate and regular rhythm.     Heart sounds: No murmur heard.     Comments: DP pulses present R+L Pulmonary:     Effort: Pulmonary effort is normal.     Breath sounds: No rales.  Abdominal:     General: Bowel sounds are normal.     Palpations: Abdomen is soft.     Tenderness: There is no abdominal tenderness.  Musculoskeletal:     Right lower leg: Edema present.     Left lower leg: Edema present.     Comments: R s/p IM rod for tibia fx. 1+edema BLE. Left facial dependent edema from sleeping on the left sides of her face  Skin:    General: Skin is warm and dry.  Neurological:     General: No focal deficit present.     Mental Status: She is alert.     Gait: Gait abnormal.  Psychiatric:        Mood and Affect: Mood normal.     Comments: Followed simple directions during today's examination, but confused.      Labs reviewed: Recent Labs    01/09/20 0425 01/10/20 0430 01/11/20 0306 01/15/20 0000 05/15/20 0000 06/03/20 0000 11/13/20 1325  NA 140 140 140   < > 142 139 139  K 3.8 3.8 3.6   < > 5.1 4.9 4.5  CL 108 107 110   < > 107 102 103  CO2 23 23 21*   < > 25* 28* 27*  GLUCOSE 103* 90 88  --   --   --   --   BUN 27* 19 14   < > 33* 25* 20  CREATININE 0.98 0.85 0.74   < > 1.3* 1.3* 1.0  CALCIUM 8.8* 8.6* 8.6*   < > 10.5 10.5 10.0   < > = values in this interval not displayed.   Recent Labs    01/08/20 1448 01/09/20 0425 01/11/20 0306 03/12/20 0000 04/10/20 0000 11/13/20 1325  AST 14* 11* 11* 9* 10* 9*  ALT _1 6* 7 7  ALKPHOS 58 49 62 107 98 70  BILITOT 0.7 0.6 0.8  --   --   --   PROT 5.9* 5.4* 5.4*  --   --   --   ALBUMIN 2.6* 2.5* 2.5* 3.5 3.8 3.6   Recent Labs    01/08/20 1448 01/08/20 2035 01/09/20 0425 01/10/20 0430 01/11/20 0306 01/21/20 0000 03/12/20 0000 04/10/20 0000 11/13/20 1325  WBC 15.8*   < >  12.6* 15.1* 12.1*   < > 7.2 7.9 8.2  NEUTROABS 12.2*  --   --   --   --   --  3,902 4,487  --   HGB 8.4*   < > 6.9* 9.9* 10.8*   < >  11.0* 11.2* 11.0*  HCT 27.4*   < > 22.8* 29.8* 34.0*   < > 34* 34* 34*  MCV 97.5   < > 98.7 87.9 91.2  --   --   --   --   PLT 260   < > 207 217 266   < > 234 238 288   < > = values in this interval not displayed.   Lab Results  Component Value Date   TSH 2.32 11/13/2020   Lab Results  Component Value Date   HGBA1C 5.3 11/01/2016   No results found for: CHOL, HDL, LDLCALC, LDLDIRECT, TRIG, CHOLHDL  Significant Diagnostic Results in last 30 days:  No results found.  Assessment/Plan There are no diagnoses linked to this encounter.   Family/ staff Communication:   Labs/tests ordered:

## 2020-12-16 NOTE — Assessment & Plan Note (Addendum)
impulsive, no safety awareness, no change seen since off  Memantine a month ago, resides in SNF [facility]

## 2020-12-16 NOTE — Assessment & Plan Note (Signed)
More alert after reduced  Risperdal 1mg qhs, Depakote, Duloxetine, Clonazepam prn, TSH 2.32 11/13/20

## 2020-12-16 NOTE — Assessment & Plan Note (Signed)
Stable, continue Levothyroxine. TSH 2.32 11/13/20

## 2020-12-16 NOTE — Assessment & Plan Note (Signed)
dependent, usually seen in legs when sitting up too long, takes Furosemide.Bun/creat 20/1.0, eGFR 52 11/13/20

## 2021-01-04 IMAGING — CT CT ABD-PELV W/ CM
2 of 5 series · 15 of 46 positions shown, 17 images · IV contrast (omnipaque)
Comparison: None.

CLINICAL DATA: Abdominal pain, distension, fever

EXAM:
CT ABDOMEN AND PELVIS WITH CONTRAST
TECHNIQUE: Multidetector CT imaging of the abdomen and pelvis was performed
using the standard protocol following bolus administration of
intravenous contrast.
CONTRAST:  75mL OMNIPAQUE IOHEXOL 300 MG/ML  SOLN

[Series 3: axial st · axial · 0.71mm/px · z∈[+1199,+1594]mm · 12 of 93 slices shown, 14 images]
[im 7/93  soft-tissue]
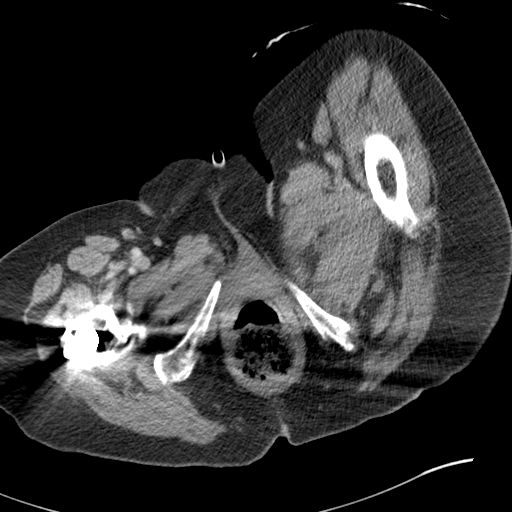
[im 7/93  bone]
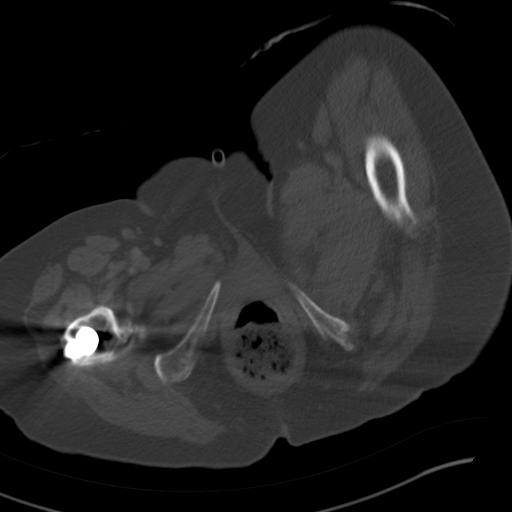
[im 13/93  soft-tissue]
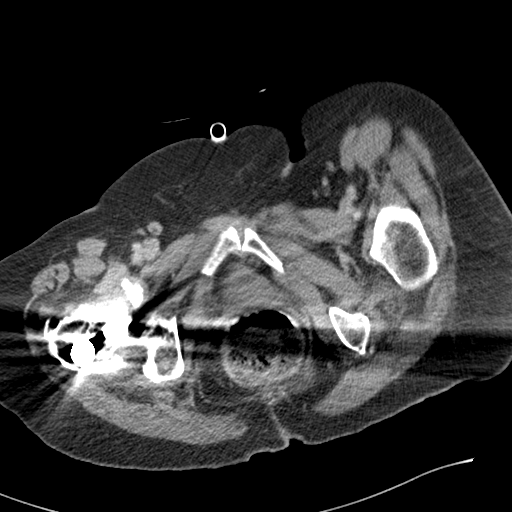
[im 19/93  soft-tissue]
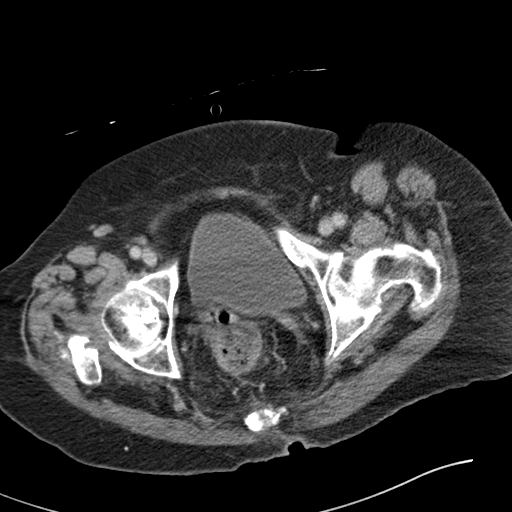
[im 31/93  soft-tissue]
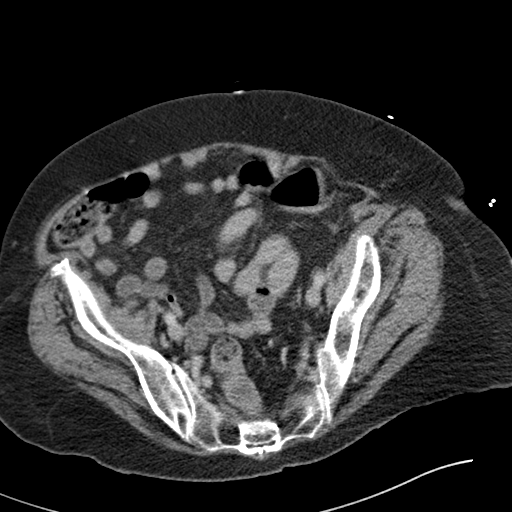
[im 37/93  soft-tissue]
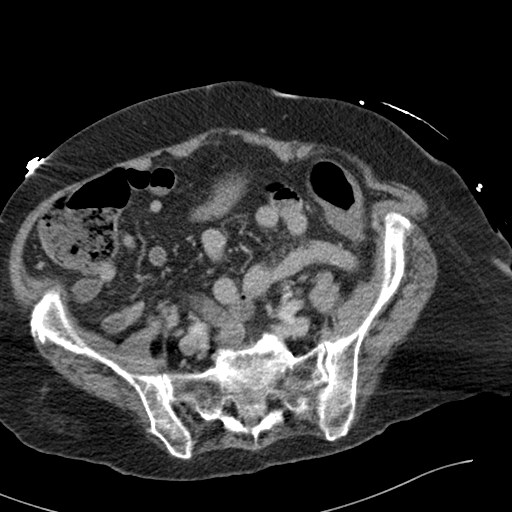
[im 43/93  soft-tissue]
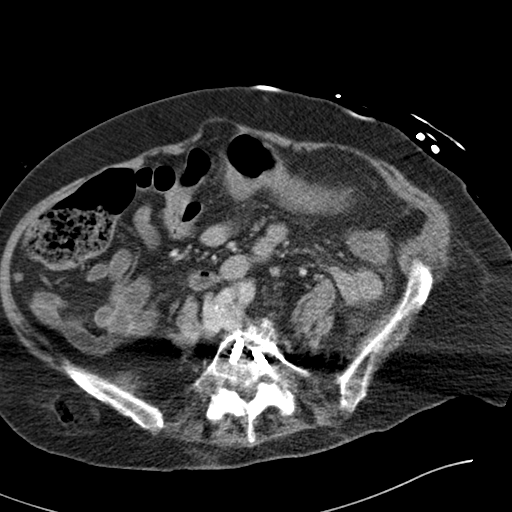
[im 50/93  soft-tissue]
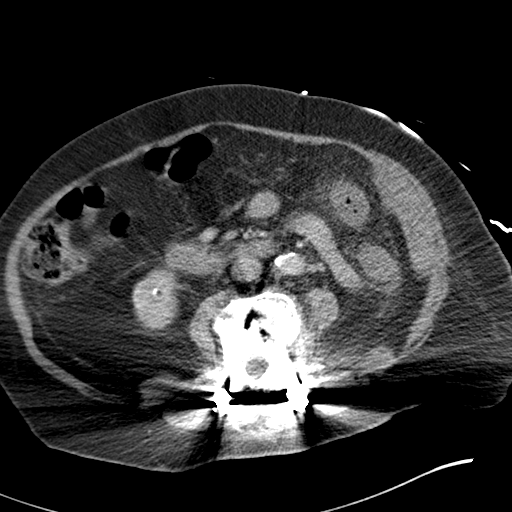
[im 56/93  soft-tissue]
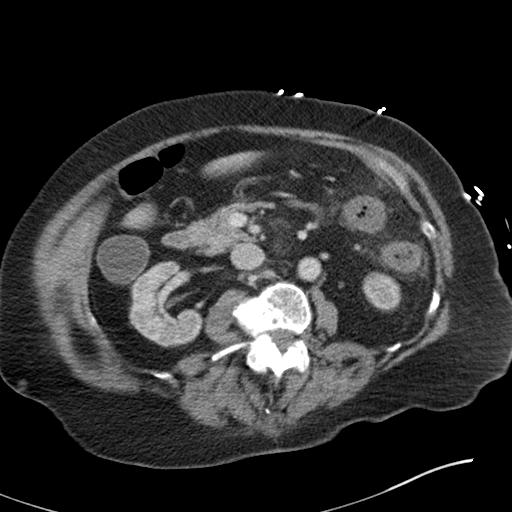
[im 62/93  soft-tissue]
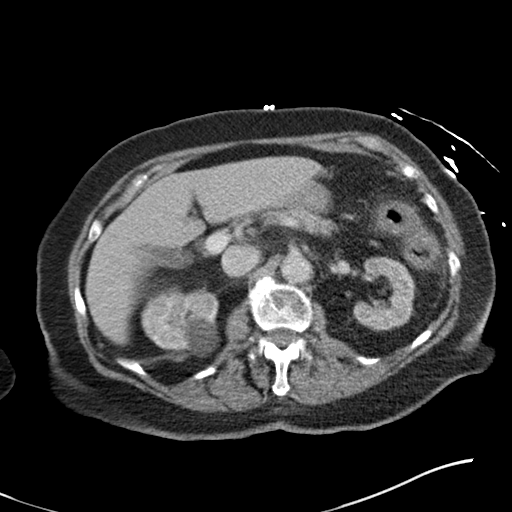
[im 62/93  bone]
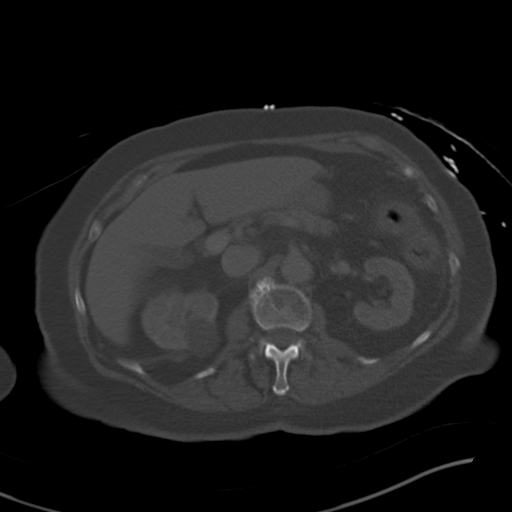
[im 74/93  soft-tissue]
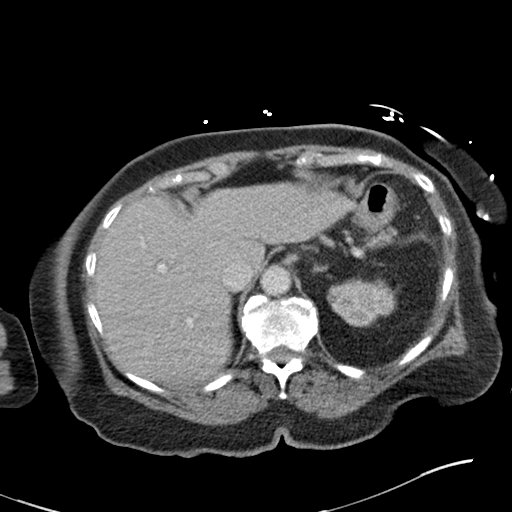
[im 80/93  soft-tissue]
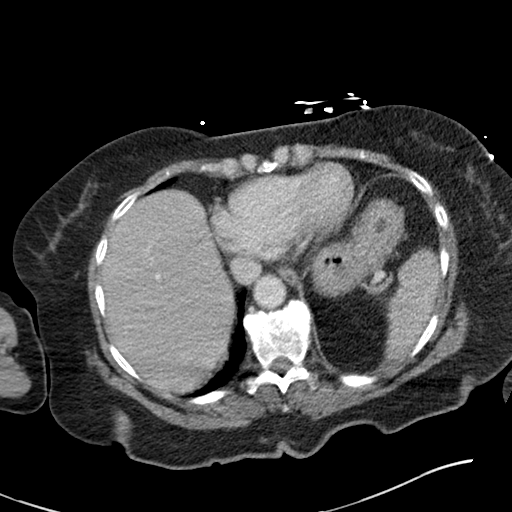
[im 86/93  soft-tissue]
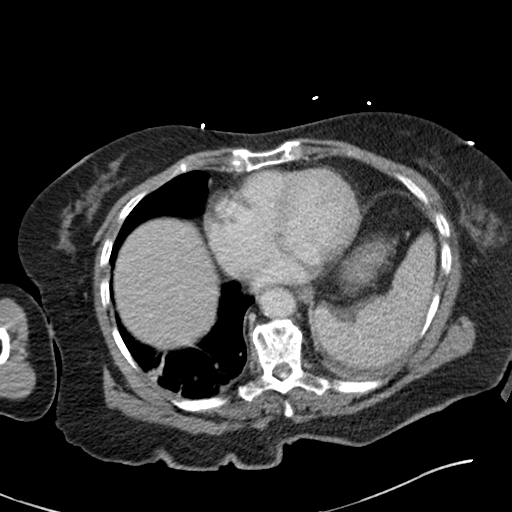

[Series 6: coronal st · coronal · 0.75mm/px · 3 of 142 slices shown]
[im 48/142  soft-tissue]
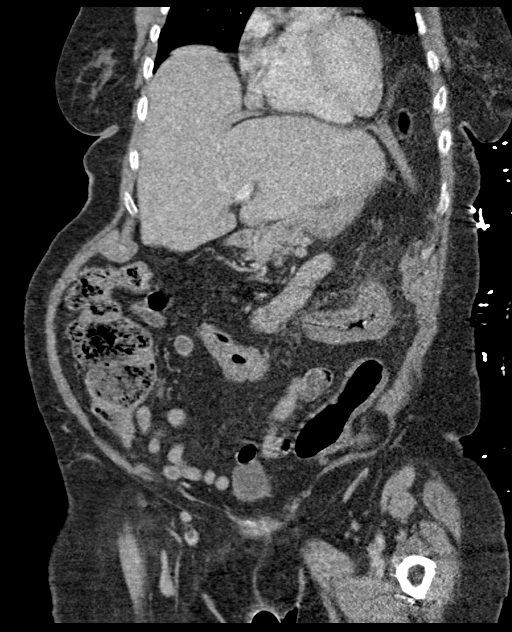
[im 63/142  soft-tissue]
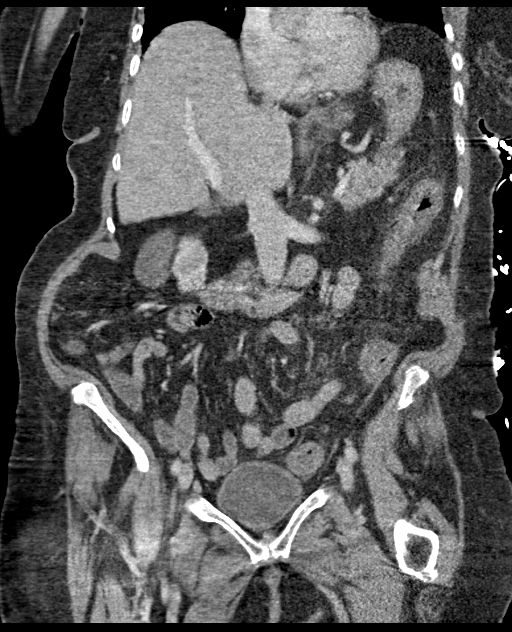
[im 79/142  soft-tissue]
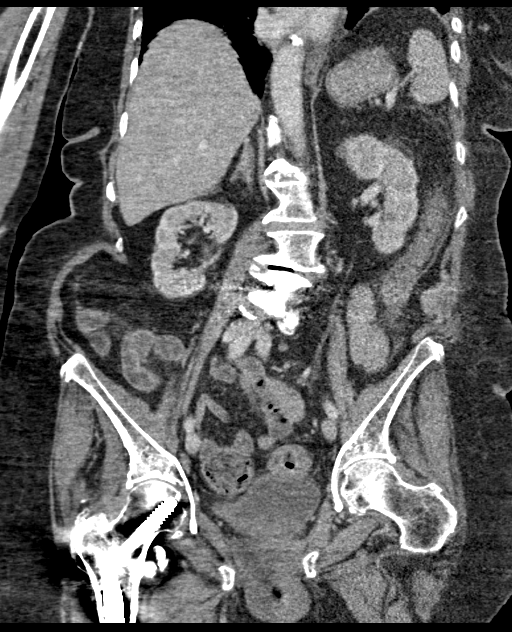

[15 of 46 positions shown; findings below may reference images not displayed]

FINDINGS: Lower chest: Elevation of the left hemidiaphragm with partially
visualized consolidation in the posterior left lung base which may
reflect atelectasis versus pneumonia. Bandlike atelectasis within
the right lung base. Heart size is upper limits of normal.

Hepatobiliary: No focal liver abnormality is seen. No gallstones,
gallbladder wall thickening, or biliary dilatation.

Pancreas: Unremarkable. No pancreatic ductal dilatation or
surrounding inflammatory changes.

Spleen: Normal in size without focal abnormality.

Adrenals/Urinary Tract: Adrenal glands within normal limits. Simple
2.6 cm upper pole right renal cyst. Two renal stones within the
inferior right renal calyx, each measuring approximately 9 mm.
Single 4 mm stone within the inferior calyx of the left kidney.
There is no hydronephrosis. Ureters and urinary bladder appear
unremarkable.

Stomach/Bowel: Long segment wall thickening involving the mid to
distal aspect of the transverse colon to the distal descending colon
with hazy pericolonic fat stranding. There are a few prominent
diverticula notably at the mid transverse colon. Remainder of the
bowel appears within normal limits. A normal appendix is located in
the right lower quadrant. No dilated loops of bowel to suggest
obstruction. Stomach is within normal limits. Normal positioning of
the GE junction.

Vascular/Lymphatic: Scattered aortoiliac atherosclerotic
calcification without aneurysm. No abdominopelvic lymphadenopathy.

Reproductive: Status post hysterectomy. No adnexal masses.

Other: No free air, free fluid, focal fluid collection, or abdominal
wall hernia.

Musculoskeletal: Prior right hip ORIF. Prior L4-5 fusion. Severe
adjacent segment disease at L3-4 with grade 2-3 anterolisthesis. No
acute fracture.
IMPRESSION: 1. Long segment wall thickening extending from the mid transverse
colon to the distal descending colon with hazy pericolonic fat
stranding. Findings are suggestive of an infectious or inflammatory
colitis.
2. Partially visualized consolidation in the posterior left lung
base may reflect atelectasis versus pneumonia.
3. Bilateral nonobstructing nephrolithiasis.
4. Prior L4-5 PLIF with severe adjacent segment disease at L3-4
where there is grade 2-3 anterolisthesis.
5. Aortic atherosclerosis. ([id number]).

## 2021-01-20 ENCOUNTER — Non-Acute Institutional Stay (SKILLED_NURSING_FACILITY): Payer: Medicare Other | Admitting: Nurse Practitioner

## 2021-01-20 ENCOUNTER — Encounter: Payer: Self-pay | Admitting: Nurse Practitioner

## 2021-01-20 DIAGNOSIS — N816 Rectocele: Secondary | ICD-10-CM | POA: Diagnosis not present

## 2021-01-20 DIAGNOSIS — R609 Edema, unspecified: Secondary | ICD-10-CM | POA: Diagnosis not present

## 2021-01-20 DIAGNOSIS — K5901 Slow transit constipation: Secondary | ICD-10-CM

## 2021-01-20 DIAGNOSIS — I10 Essential (primary) hypertension: Secondary | ICD-10-CM

## 2021-01-20 DIAGNOSIS — F0280 Dementia in other diseases classified elsewhere without behavioral disturbance: Secondary | ICD-10-CM

## 2021-01-20 DIAGNOSIS — L89892 Pressure ulcer of other site, stage 2: Secondary | ICD-10-CM

## 2021-01-20 DIAGNOSIS — F0150 Vascular dementia without behavioral disturbance: Secondary | ICD-10-CM

## 2021-01-20 DIAGNOSIS — G309 Alzheimer's disease, unspecified: Secondary | ICD-10-CM

## 2021-01-20 DIAGNOSIS — M159 Polyosteoarthritis, unspecified: Secondary | ICD-10-CM

## 2021-01-20 DIAGNOSIS — E039 Hypothyroidism, unspecified: Secondary | ICD-10-CM

## 2021-01-20 DIAGNOSIS — F339 Major depressive disorder, recurrent, unspecified: Secondary | ICD-10-CM

## 2021-01-20 NOTE — Assessment & Plan Note (Signed)
Multiple sites, Tylenol bid,  Tramadol bid/prn available to her.

## 2021-01-20 NOTE — Assessment & Plan Note (Signed)
takes Risperdal 1mg  qhs, Depakote, Duloxetine, Clonazepam prn,  TSH 2.32 11/13/20

## 2021-01-20 NOTE — Progress Notes (Signed)
Location:   [facility] Room Number: [phone] Place of Service:  SNF (31) Provider:  Zakira Ressel Otho Darner, NP    Patient Care Team: Haley Dad, MD as PCP - General (Internal Medicine) Haley Allbritton X, NP as Nurse Practitioner (Internal Medicine) Haley Dad, MD (Internal Medicine)  Extended Emergency Contact Information Primary Emergency Contact: Haley Cohen Address: [address]          [place], [place] [address] Haley Cohen of [place] Phone: [phone] Relation: Son Secondary Emergency Contact: [facility] Address: [address] [place] [address] Haley Cohen of [employer] Phone: [phone] Relation: Daughter  Code Status:  DNR Goals of care: Advanced Directive information Advanced Directives 01/20/2021  Does Patient Have a Medical Advance Directive? Yes  Type of Paramedic of [place];Living will;Out of facility DNR (pink MOST or yellow form)  Does patient want to make changes to medical advance directive? No - Patient declined  Copy of [place] in Chart? Yes - validated most recent copy scanned in chart (See row information)  Would patient like information on creating a medical advance directive? -  Pre-existing out of facility DNR order (yellow form or pink MOST form) Yellow form placed in chart (order not valid for inpatient use);Pink MOST form placed in chart (order not valid for inpatient use)     Chief Complaint  Patient presents with   Medical Management of Chronic Issues    Routine follow up.    Health Maintenance    Discuss need for shingles vaccine.     HPI:  Pt is a 87 y.o. female seen today for medical management of chronic diseases.    Pressure ulcer R+L heel/distal calcaneal tender region, R>L   Edema, dependent, usually seen in legs when sitting up too long, takes Furosemide. Bun/creat 20/1.0, eGFR 52 11/13/20             Constipation, takes Colace, MiraLax.              Rectocele avoid constipation             Dementia, impulsive, no safety awareness, off  Memantine, resides in SN[facility]             Depression/anxiety: takes Risperdal 70m qhs, Depakote, Duloxetine, Clonazepam prn,  TSH 2.32 11/13/20             HTN, takes Lisinopril, Furosemide, ASA  Bun/creat 20/1.0 11/13/20              Hypothyroidism, takes Levothyroxine. TSH 2.32 11/13/20             OA,  Multiple sites, Tylenol bid,  Tramadol bid/prn available to her.      Past Medical History:  Diagnosis Date   Alzheimer disease ([place] 10/27/2016   Arthritis    Confusion 04/04/2019   [place]/27[place]   History of fracture of right hip 04/04/2019   07/01/19 Ortho, R hip incision healed, Cohen-ray looks good, s/p IMN R hip fx, WBAT, f/u 6 mos.    HTN (hypertension) 10/27/2016   Hypertension    Hypertensive kidney disease with CKD (chronic kidney disease) stage V ([place] 11/02/2016   11/02/16 Na 136, K 4.7, Bun 30, creat 1.52, TSH 0.86, wbc 6.7, Hgb 11.3, plt 264   Hypothyroidism 10/27/2016   Osteoarthritis 10/27/2016   Thyroid disease    Past Surgical History:  Procedure Laterality Date  ABDOMINAL HYSTERECTOMY  1986   INTRAMEDULLARY (IM) NAIL INTERTROCHANTERIC Right 04/04/2019   Procedure: INTRAMEDULLARY (IM) NAIL INTERTROCHANTRIC;  Surgeon: Haley Can, MD;  Location: [facility];  Service: Orthopedics;  Laterality: Right;   KNEE ARTHROSCOPY  2015/2017   Dr. Gustavus Cohen   SPINE SURGERY  2009, 2011   [facility]   TIBIA IM NAIL INSERTION Right 01/01/2020   Procedure: INTRAMEDULLARY (IM) NAIL TIBIAL;  Surgeon: Haley Needles, MD;  Location: [facility];  Service: Orthopedics;  Laterality: Right;    No Known Allergies  Allergies as of 01/20/2021   No Known Allergies      Medication List        Accurate as of January 20, 2021 11:59 PM. If you have any questions, ask your nurse or doctor.          STOP taking these medications    cholecalciferol 25 MCG (1000 UNIT) tablet Commonly known  as: VITAMIN D3 Stopped by: [facility] Haley Creech, NP       TAKE these medications    acetaminophen 325 MG tablet Commonly known as: TYLENOL Take 650 mg by mouth 2 (two) times daily.   aspirin 81 MG chewable tablet Chew 81 mg by mouth daily.   clonazePAM 0.5 MG tablet Commonly known as: KLONOPIN Take 0.5 mg by mouth every 6 (six) hours as needed for anxiety.   collagenase ointment Commonly known as: SANTYL Apply 1 application topically daily.   divalproex 125 MG DR tablet Commonly known as: DEPAKOTE Take 250 mg by mouth at bedtime.   DULoxetine 30 MG capsule Commonly known as: CYMBALTA Take 30 mg by mouth daily.   feeding supplement (PRO-STAT SUGAR FREE 64) Liqd Take 30 mLs by mouth daily. Mix with 4oz of juice   furosemide 20 MG tablet Commonly known as: LASIX Take 40 mg by mouth daily.   levothyroxine 25 MCG tablet Commonly known as: SYNTHROID Take 25 mcg by mouth daily before breakfast.   ondansetron 4 MG disintegrating tablet Commonly known as: ZOFRAN-ODT Take 4 mg by mouth every 8 (eight) hours as needed for nausea or vomiting.   polyethylene glycol 17 g packet Commonly known as: MIRALAX / GLYCOLAX Take 17 g by mouth daily.   potassium chloride 10 MEQ CR capsule Commonly known as: MICRO-K Take 10 mEq by mouth daily.   potassium chloride SA 20 MEQ tablet Commonly known as: KLOR-CON Take 20 mEq by mouth daily.   risperiDONE 2 MG tablet Commonly known as: RISPERDAL Take 1 mg by mouth at bedtime.   traMADol 50 MG tablet Commonly known as: ULTRAM Take 0.5 tablets (25 mg total) by mouth every 6 (six) hours as needed.   traMADol 50 MG tablet Commonly known as: ULTRAM Take 25 mg by mouth 2 (two) times daily.        Review of Systems  Constitutional:  Negative for fatigue, fever and unexpected weight change.  HENT:  Positive for hearing loss. Negative for congestion and voice change.   Eyes:  Negative for visual disturbance.  Respiratory:  Negative for  shortness of breath.   Cardiovascular:  Positive for leg swelling.  Gastrointestinal:  Negative for abdominal pain and constipation.       Hx of rectocele.   Genitourinary:  Negative for dysuria, frequency and urgency.  Musculoskeletal:  Positive for arthralgias and gait problem.       S/p R lower leg IM tibia  Skin:  Positive for wound. Negative for color change.  Neurological:  Negative for speech difficulty,  weakness and headaches.       Dementia, lethargic.   Psychiatric/Behavioral:  Positive for agitation, behavioral problems, confusion and sleep disturbance. The patient is nervous/anxious.        Improving.    Immunization History  Administered Date(s) Administered   Influenza-Unspecified 05/29/2017, 05/20/2020   Moderna Sars-Covid-2 Vaccination 09/07/2019, 10/05/2019, 06/16/2020   Pneumococcal Conjugate-13 06/21/2017   Pneumococcal Polysaccharide-23 07/16/2018   Tdap 06/21/2017   Pertinent  Health Maintenance Due  Topic Date Due   DEXA SCAN  Completed   PNA vac Low Risk Adult  Completed   INFLUENZA VACCINE  Discontinued   Fall Risk  04/27/2018 04/25/2017  Falls in the past year? No No   Functional Status Survey:    Vitals:   01/20/21 0908  BP: (!) 116/52  Pulse: 68  Resp: 16  Temp: 97.7 F (36.5 C)  SpO2: 98%  Weight: 147 lb (66.7 kg)  Height: 4' 11"  (1.499 m)   Body mass index is 29.69 kg/m. Physical Exam Vitals and nursing note reviewed.  Constitutional:      Appearance: Normal appearance.  HENT:     Head: Normocephalic and atraumatic.     Mouth/Throat:     Mouth: Mucous membranes are moist.  Eyes:     Extraocular Movements: Extraocular movements intact.     Conjunctiva/sclera: Conjunctivae normal.     Pupils: Pupils are equal, round, and reactive to light.  Cardiovascular:     Rate and Rhythm: Normal rate and regular rhythm.     Heart sounds: No murmur heard.    Comments: DP pulses present R+L Pulmonary:     Effort: Pulmonary effort is normal.      Breath sounds: No rales.  Abdominal:     General: Bowel sounds are normal.     Palpations: Abdomen is soft.     Tenderness: There is no abdominal tenderness.  Musculoskeletal:     Right lower leg: Edema present.     Left lower leg: Edema present.     Comments: R s/p IM [facility] for tibia fx. Trace to 1+edema BLE. Left facial dependent edema from sleeping on the left sides of her face  Skin:    General: Skin is warm and dry.     Comments: Stage 2 pressure ulcer R+L heel/distal calcaneal tendon, a dime sized left heel, a quarter sized right heel, no s/s of infection, a off while colored tissue covered wound beds.   Neurological:     General: No focal deficit present.     Mental Status: She is alert.     Gait: Gait abnormal.  Psychiatric:        Mood and Affect: Mood normal.     Comments: Followed simple directions during today's examination, but confused.     Labs reviewed: Recent Labs    05/15/20 0000 06/03/20 0000 11/13/20 1325  NA 142 139 139  K 5.1 4.9 4.5  CL 107 102 103  CO2 25* 28* 27*  BUN 33* 25* 20  CREATININE 1.3* 1.3* 1.0  CALCIUM 10.5 10.5 10.0   Recent Labs    03/12/20 0000 04/10/20 0000 11/13/20 1325  AST 9* 10* 9*  ALT 6* 7 7  ALKPHOS 107 98 70  ALBUMIN 3.5 3.8 3.6   Recent Labs    03/12/20 0000 04/10/20 0000 11/13/20 1325  WBC 7.2 7.9 8.2  NEUTROABS 3,902 4,487  --   HGB 11.0* 11.2* 11.0*  HCT 34* 34* 34*  PLT 234 238 288   Lab  Results  Component Value Date   TSH 2.32 11/13/2020   Lab Results  Component Value Date   HGBA1C 5.3 11/01/2016   No results found for: CHOL, HDL, LDLCALC, LDLDIRECT, TRIG, CHOLHDL  Significant Diagnostic Results in last 30 days:  No results found.  Assessment/Plan Pressure ulcer of both feet, stage 2 (HCC) Pressure ulcer R+L heel/distal calcaneal tender region, R>L will apply Hydrocolloid dressing q 3days and prn.   Dependent edema Edema, dependent, usually seen in legs when sitting up too long, takes  Furosemide. Bun/creat 20/1.0, eGFR 52 11/13/20  Slow transit constipation takes Colace, MiraLax.  Rectocele Avoid constipation.   Mixed Alzheimer's and vascular dementia ([place]) impulsive, no safety awareness, off  Memantine, resides in SNF [facility]  Depression, recurrent ([place]) takes Risperdal 60m qhs, Depakote, Duloxetine, Clonazepam prn,  TSH 2.32 11/13/20  Essential hypertension Blood pressure is controlled, takes Lisinopril, Furosemide, ASA  Bun/creat 20/1.0 11/13/20   Hypothyroidism takes Levothyroxine. TSH 2.32 11/13/20  Generalized osteoarthritis of multiple sites Multiple sites, Tylenol bid,  Tramadol bid/prn available to her.       Family/ staff Communication: plan of care reviewed with the patient and charge nurse.   Labs/tests ordered:  none  Time spend 25 minutes.

## 2021-01-20 NOTE — Assessment & Plan Note (Signed)
takes Colace, MiraLax 

## 2021-01-20 NOTE — Assessment & Plan Note (Signed)
Blood pressure is controlled, takes Lisinopril, Furosemide, ASA  Bun/creat 20/1.0 11/13/20

## 2021-01-20 NOTE — Assessment & Plan Note (Signed)
takes Levothyroxine. TSH 2.32 11/13/20

## 2021-01-20 NOTE — Assessment & Plan Note (Signed)
Avoid constipation 

## 2021-01-20 NOTE — Assessment & Plan Note (Signed)
Pressure ulcer R+L heel/distal calcaneal tender region, R>L will apply Hydrocolloid dressing q 3days and prn.

## 2021-01-20 NOTE — Assessment & Plan Note (Signed)
impulsive, no safety awareness, off  Memantine, resides in SNF [facility]

## 2021-01-20 NOTE — Assessment & Plan Note (Signed)
Edema, dependent, usually seen in legs when sitting up too long, takes Furosemide. Bun/creat 20/1.0, eGFR 52 11/13/20

## 2021-01-24 ENCOUNTER — Telehealth: Payer: Self-pay | Admitting: Family

## 2021-01-24 NOTE — Telephone Encounter (Signed)
Facility Nurse called to report patient tested positive for COVID-19 has mild symptoms Temp 99.4 Vitamin C,Vit D 2000 units QD,Zinc 50 mg tablet QD x 14 days ordered.please follow up.

## 2021-01-25 ENCOUNTER — Non-Acute Institutional Stay (SKILLED_NURSING_FACILITY): Payer: Medicare Other | Admitting: Nurse Practitioner

## 2021-01-25 ENCOUNTER — Encounter: Payer: Self-pay | Admitting: Nurse Practitioner

## 2021-01-25 DIAGNOSIS — R609 Edema, unspecified: Secondary | ICD-10-CM

## 2021-01-25 DIAGNOSIS — U071 COVID-19: Secondary | ICD-10-CM

## 2021-01-25 DIAGNOSIS — M159 Polyosteoarthritis, unspecified: Secondary | ICD-10-CM

## 2021-01-25 DIAGNOSIS — L89892 Pressure ulcer of other site, stage 2: Secondary | ICD-10-CM

## 2021-01-25 DIAGNOSIS — G309 Alzheimer's disease, unspecified: Secondary | ICD-10-CM

## 2021-01-25 DIAGNOSIS — E039 Hypothyroidism, unspecified: Secondary | ICD-10-CM

## 2021-01-25 DIAGNOSIS — F0280 Dementia in other diseases classified elsewhere without behavioral disturbance: Secondary | ICD-10-CM

## 2021-01-25 DIAGNOSIS — K5901 Slow transit constipation: Secondary | ICD-10-CM

## 2021-01-25 DIAGNOSIS — F0150 Vascular dementia without behavioral disturbance: Secondary | ICD-10-CM

## 2021-01-25 DIAGNOSIS — N816 Rectocele: Secondary | ICD-10-CM

## 2021-01-25 DIAGNOSIS — I10 Essential (primary) hypertension: Secondary | ICD-10-CM

## 2021-01-25 DIAGNOSIS — F339 Major depressive disorder, recurrent, unspecified: Secondary | ICD-10-CM

## 2021-01-25 NOTE — Progress Notes (Signed)
Location:   [place] Room Number: [id number] Place of Service:  SNF (31) Provider:  Kaelan Amble Otho Darner, NP    Patient Care Team: [name], MD as PCP - General (Internal Medicine) Kenslei Hearty X, NP as Nurse Practitioner (Internal Medicine) [name], MD (Internal Medicine)  Extended Emergency Contact Information Primary Emergency Contact: [facility] Address: [address]          [place], [place] [address] [name] of [place] Phone: [phone] Relation: Son Secondary Emergency Contact: [facility] Address: [address] [place] [address] [name] Phone: [phone] Relation: Daughter  Code Status:  DNR Goals of care: Advanced Directive information Advanced Directives 01/25/2021  Does Patient Have a Medical Advance Directive? Yes  Type of Paramedic of [place];Living will;Out of facility DNR (pink MOST or yellow form)  Does patient want to make changes to medical advance directive? No - Patient declined  Copy of [place] in Chart? Yes - validated most recent copy scanned in chart (See row information)  Would patient like information on creating a medical advance directive? -  Pre-existing out of facility DNR order (yellow form or pink MOST form) Yellow form placed in chart (order not valid for inpatient use);Pink MOST form placed in chart (order not valid for inpatient use)     Chief Complaint  Patient presents with   Acute Visit    Patient presents with a positive covid infection.     HPI:  Pt is a 87 y.o. female seen today for an acute visit for tested positive COVID. The patient is in her usual state of health except mild nasal congestion. Her goal of care is comfort measures, under Hospice service. Vit C, Vit D, Zinc supplemented started.   Pressure ulcer R+L heel/distal calcaneal tendon region, R>L              Edema, dependent, usually seen in legs when  sitting up too long, takes Furosemide. Bun/creat 20/1.0, eGFR 52 11/13/20             Constipation, takes Colace, MiraLax.             Rectocele avoid constipation             Dementia, impulsive, no safety awareness, off  [place], resides in SNF [facility]             Depression/anxiety: takes Risperdal 66m qhs, Depakote, Duloxetine, Clonazepam prn,  TSH 2.32 11/13/20             HTN, takes Lisinopril, Furosemide, ASA  Bun/creat 20/1.0 11/13/20              Hypothyroidism, takes Levothyroxine. TSH 2.32 11/13/20             OA,  Multiple sites, Tylenol bid,  Tramadol bid/prn available to her.      Past Medical History:  Diagnosis Date   Alzheimer disease ([facility] 10/27/2016   Arthritis    Confusion 04/04/2019   Depression, recurrent ([facility] 10/27/2016   History of fracture of right hip 04/04/2019   07/01/19 Ortho, R hip incision healed, X-ray looks good, s/p IMN R hip fx, WBAT, f/u 6 mos.    HTN (hypertension) 10/27/2016   Hypertension    Hypertensive kidney disease with CKD (chronic kidney disease) stage V ([facility] 11/02/2016   11/02/16 Na 136, K 4.7, Bun 30, creat 1.52, TSH 0.86,  wbc 6.7, Hgb 11.3, plt 264   Hypothyroidism 10/27/2016   Osteoarthritis 10/27/2016   Thyroid disease    Past Surgical History:  Procedure Laterality Date   ABDOMINAL HYSTERECTOMY  1986   INTRAMEDULLARY (IM) NAIL INTERTROCHANTERIC Right 04/04/2019   Procedure: INTRAMEDULLARY (IM) NAIL INTERTROCHANTRIC;  Surgeon: Rod Can, MD;  Location: [facility];  Service: Orthopedics;  Laterality: Right;   KNEE ARTHROSCOPY  2015/2017   Dr. Gustavus Bryant   SPINE SURGERY  2009, 2011   Dr.Mark Nicoletta Dress   TIBIA IM NAIL INSERTION Right 01/01/2020   Procedure: INTRAMEDULLARY (IM) NAIL TIBIAL;  Surgeon: Shona Needles, MD;  Location: [place];  Service: Orthopedics;  Laterality: Right;    No Known Allergies  Allergies as of 01/25/2021   No Known Allergies      Medication List        Accurate as of January 25, 2021 11:59 PM. If you have any questions,  ask your nurse or doctor.          STOP taking these medications    collagenase ointment Commonly known as: SANTYL Stopped by: Marrietta Thunder X Keeton Kassebaum, NP   ondansetron 4 MG disintegrating tablet Commonly known as: ZOFRAN-ODT Stopped by: [place] Dani X Tondalaya Perren, NP       TAKE these medications    acetaminophen 325 MG tablet Commonly known as: TYLENOL Take 650 mg by mouth 2 (two) times daily.   aspirin 81 MG chewable tablet Chew 81 mg by mouth daily.   clonazePAM 0.5 MG tablet Commonly known as: KLONOPIN Take 0.5 mg by mouth every 6 (six) hours as needed for anxiety.   divalproex 125 MG DR tablet Commonly known as: DEPAKOTE Take 250 mg by mouth at bedtime.   DULoxetine 30 MG capsule Commonly known as: CYMBALTA Take 30 mg by mouth daily.   feeding supplement (PRO-STAT SUGAR FREE 64) Liqd Take 30 mLs by mouth daily. Mix with 4oz of juice   furosemide 20 MG tablet Commonly known as: LASIX Take 40 mg by mouth daily.   levothyroxine 25 MCG tablet Commonly known as: SYNTHROID Take 25 mcg by mouth daily before breakfast.   ondansetron 4 MG/5ML solution Commonly known as: ZOFRAN Take 4 mg by mouth daily as needed for nausea or vomiting.   polyethylene glycol 17 g packet Commonly known as: MIRALAX / GLYCOLAX Take 17 g by mouth daily.   potassium chloride 10 MEQ CR capsule Commonly known as: MICRO-K Take 10 mEq by mouth daily.   potassium chloride SA 20 MEQ tablet Commonly known as: KLOR-CON Take 20 mEq by mouth daily.   risperiDONE 2 MG tablet Commonly known as: RISPERDAL Take 1 mg by mouth at bedtime.   traMADol 50 MG tablet Commonly known as: ULTRAM Take 0.5 tablets (25 mg total) by mouth every 6 (six) hours as needed.   traMADol 50 MG tablet Commonly known as: ULTRAM Take 25 mg by mouth 2 (two) times daily.   vitamin C 1000 MG tablet Take 1,000 mg by mouth daily.   Vitamin D (Cholecalciferol) 25 MCG (1000 UT) Tabs Take 1 tablet by mouth daily.   Zinc 50 MG  Tabs Take 1 tablet by mouth daily.        Review of Systems  Constitutional:  Negative for activity change, appetite change, fatigue and fever.  HENT:  Positive for congestion, hearing loss and rhinorrhea. Negative for sinus pressure, sore throat and voice change.   Eyes:  Negative for visual disturbance.  Respiratory:  Negative for shortness of breath.  Cardiovascular:  Positive for leg swelling.  Gastrointestinal:  Negative for abdominal pain and constipation.       Hx of rectocele.   Genitourinary:  Negative for dysuria, frequency and urgency.  Musculoskeletal:  Positive for arthralgias and gait problem.       S/p R lower leg IM tibia  Skin:  Positive for wound. Negative for color change.  Neurological:  Negative for speech difficulty, weakness and headaches.       Dementia, lethargic.   Psychiatric/Behavioral:  Positive for agitation, behavioral problems, confusion and sleep disturbance. The patient is nervous/anxious.        Improving.    Immunization History  Administered Date(s) Administered   Influenza-Unspecified 05/29/2017, 05/20/2020   Moderna Sars-Covid-2 Vaccination 09/07/2019, 10/05/2019, 06/16/2020   Pneumococcal Conjugate-13 06/21/2017   Pneumococcal Polysaccharide-23 07/16/2018   Tdap 06/21/2017   Pertinent  Health Maintenance Due  Topic Date Due   DEXA SCAN  Completed   PNA vac Low Risk Adult  Completed   INFLUENZA VACCINE  Discontinued   Fall Risk  04/27/2018 04/25/2017  Falls in the past year? No No   Functional Status Survey:    Vitals:   01/25/21 1617  BP: 140/80  Pulse: 100  Resp: 16  Temp: 98 F (36.7 C)  SpO2: 92%  Weight: 147 lb (66.7 kg)  Height: 4' 11"  (1.499 m)   Body mass index is 29.69 kg/m. Physical Exam Vitals and nursing note reviewed.  Constitutional:      Appearance: Normal appearance.  HENT:     Head: Normocephalic and atraumatic.     Nose: Congestion and rhinorrhea present.     Mouth/Throat:     Mouth: Mucous  membranes are moist.  Eyes:     Extraocular Movements: Extraocular movements intact.     Conjunctiva/sclera: Conjunctivae normal.     Pupils: Pupils are equal, round, and reactive to light.  Cardiovascular:     Rate and Rhythm: Normal rate and regular rhythm.     Heart sounds: No murmur heard.    Comments: DP pulses present R+L Pulmonary:     Effort: Pulmonary effort is normal.     Breath sounds: No rales.  Abdominal:     General: Bowel sounds are normal.     Palpations: Abdomen is soft.     Tenderness: There is no abdominal tenderness.  Musculoskeletal:     Right lower leg: Edema present.     Left lower leg: Edema present.     Comments: R s/p IM rod for tibia fx. Trace to 1+edema BLE. Left facial, LUE dependent edema from sleeping on the left sides of her face  Skin:    General: Skin is warm and dry.     Comments: Stage 2 pressure ulcer R+L heel/distal calcaneal tendon, a dime sized left heel, a quarter sized right heel, no s/s of infection, a off while colored tissue covered wound beds.   Neurological:     General: No focal deficit present.     Mental Status: She is alert.     Gait: Gait abnormal.  Psychiatric:        Mood and Affect: Mood normal.     Comments: Followed simple directions during today's examination, but confused.     Labs reviewed: Recent Labs    05/15/20 0000 06/03/20 0000 11/13/20 1325  NA 142 139 139  K 5.1 4.9 4.5  CL 107 102 103  CO2 25* 28* 27*  BUN 33* 25* 20  CREATININE 1.3* 1.3* 1.0  CALCIUM 10.5 10.5 10.0   Recent Labs    03/12/20 0000 04/10/20 0000 11/13/20 1325  AST 9* 10* 9*  ALT 6* 7 7  ALKPHOS 107 98 70  ALBUMIN 3.5 3.8 3.6   Recent Labs    03/12/20 0000 04/10/20 0000 11/13/20 1325  WBC 7.2 7.9 8.2  NEUTROABS 3,902 4,487  --   HGB 11.0* 11.2* 11.0*  HCT 34* 34* 34*  PLT 234 238 288   Lab Results  Component Value Date   TSH 2.32 11/13/2020   Lab Results  Component Value Date   HGBA1C 5.3 11/01/2016   No results  found for: CHOL, HDL, LDLCALC, LDLDIRECT, TRIG, CHOLHDL  Significant Diagnostic Results in last 30 days:  No results found.  Assessment/Plan COVID-19 virus infection tested positive COVID. The patient is in her usual state of health except mild nasal congestion. Her goal of care is comfort measures, under Hospice service. Vit C, Vit D, Zinc supplemented started. Continue supportive care in SNF [facility]  Pressure ulcer of both feet, stage 2 (HCC) Pressure ulcer R+L heel/distal calcaneal tendon region, R>L, continue pressure reduction, hydrocolloid dressing.   Dependent edema Edema, dependent, usually seen in legs when sitting up too long, takes Furosemide. Bun/creat 20/1.0, eGFR 52 11/13/20  Slow transit constipation Stable, continue Colace, MiraLax.   Rectocele Chronic, avoid constipation.   Mixed Alzheimer's and vascular dementia ([place]) impulsive, no safety awareness, off  [place], resides in SNF [facility]  Depression, recurrent ([place]) Her mood is reasonably managed,  takes Risperdal 92m qhs, Depakote, Duloxetine, Clonazepam prn,  TSH 2.32 11/13/20  Essential hypertension Blood pressure is controlled, takes Lisinopril, Furosemide, ASA  Bun/creat 20/1.0 11/13/20   Hypothyroidism  takes Levothyroxine. TSH 2.32 11/13/20  Generalized osteoarthritis of multiple sites  Multiple sites, Tylenol bid,  Tramadol bid/prn available to her.     Family/ staff Communication: plan of care reviewed with the patient and charge nurse.   Labs/tests ordered:  none  Time spend 25 minutes.

## 2021-01-26 ENCOUNTER — Encounter: Payer: Self-pay | Admitting: Nurse Practitioner

## 2021-01-26 DIAGNOSIS — U071 COVID-19: Secondary | ICD-10-CM | POA: Insufficient documentation

## 2021-01-26 NOTE — Assessment & Plan Note (Signed)
takes Levothyroxine. TSH 2.32 11/13/20

## 2021-01-26 NOTE — Assessment & Plan Note (Signed)
Stable, continue Colace, MiraLax.  

## 2021-01-26 NOTE — Assessment & Plan Note (Signed)
Blood pressure is controlled, takes Lisinopril, Furosemide, ASA  Bun/creat 20/1.0 11/13/20

## 2021-01-26 NOTE — Assessment & Plan Note (Signed)
Edema, dependent, usually seen in legs when sitting up too long, takes Furosemide. Bun/creat 20/1.0, eGFR 52 11/13/20

## 2021-01-26 NOTE — Assessment & Plan Note (Signed)
Multiple sites, Tylenol bid,  Tramadol bid/prn available to her.

## 2021-01-26 NOTE — Assessment & Plan Note (Signed)
Chronic, avoid constipation.

## 2021-01-26 NOTE — Assessment & Plan Note (Signed)
Pressure ulcer R+L heel/distal calcaneal tendon region, R>L, continue pressure reduction, hydrocolloid dressing.

## 2021-01-26 NOTE — Assessment & Plan Note (Signed)
tested positive COVID. The patient is in her usual state of health except mild nasal congestion. Her goal of care is comfort measures, under Hospice service. Vit C, Vit D, Zinc supplemented started. Continue supportive care in SNF [facility]

## 2021-01-26 NOTE — Assessment & Plan Note (Signed)
Her mood is reasonably managed,  takes Risperdal 1mg  qhs, Depakote, Duloxetine, Clonazepam prn,  TSH 2.32 11/13/20

## 2021-01-26 NOTE — Assessment & Plan Note (Signed)
impulsive, no safety awareness, off  [place], resides in SNF [facility]

## 2021-02-01 ENCOUNTER — Encounter: Payer: Self-pay | Admitting: Nurse Practitioner

## 2021-02-01 ENCOUNTER — Non-Acute Institutional Stay (SKILLED_NURSING_FACILITY): Payer: Medicare Other | Admitting: Nurse Practitioner

## 2021-02-01 DIAGNOSIS — E039 Hypothyroidism, unspecified: Secondary | ICD-10-CM

## 2021-02-01 DIAGNOSIS — R609 Edema, unspecified: Secondary | ICD-10-CM

## 2021-02-01 DIAGNOSIS — I10 Essential (primary) hypertension: Secondary | ICD-10-CM

## 2021-02-01 DIAGNOSIS — N816 Rectocele: Secondary | ICD-10-CM

## 2021-02-01 DIAGNOSIS — M159 Polyosteoarthritis, unspecified: Secondary | ICD-10-CM

## 2021-02-01 DIAGNOSIS — U071 COVID-19: Secondary | ICD-10-CM | POA: Diagnosis not present

## 2021-02-01 DIAGNOSIS — L89892 Pressure ulcer of other site, stage 2: Secondary | ICD-10-CM | POA: Diagnosis not present

## 2021-02-01 DIAGNOSIS — G309 Alzheimer's disease, unspecified: Secondary | ICD-10-CM

## 2021-02-01 DIAGNOSIS — R296 Repeated falls: Secondary | ICD-10-CM | POA: Diagnosis not present

## 2021-02-01 DIAGNOSIS — F0280 Dementia in other diseases classified elsewhere without behavioral disturbance: Secondary | ICD-10-CM

## 2021-02-01 DIAGNOSIS — F419 Anxiety disorder, unspecified: Secondary | ICD-10-CM

## 2021-02-01 DIAGNOSIS — F32A Depression, unspecified: Secondary | ICD-10-CM

## 2021-02-01 DIAGNOSIS — K5901 Slow transit constipation: Secondary | ICD-10-CM

## 2021-02-01 DIAGNOSIS — F0150 Vascular dementia without behavioral disturbance: Secondary | ICD-10-CM

## 2021-02-01 NOTE — Assessment & Plan Note (Signed)
Tested positive COVID. The patient is in her usual state of health except mild nasal congestion. Her goal of care is comfort measures, under Hospice service. Taking Vit C, Vit D, Zinc supplemented

## 2021-02-01 NOTE — Assessment & Plan Note (Signed)
takes Levothyroxine. TSH 2.32 11/13/20

## 2021-02-01 NOTE — Assessment & Plan Note (Signed)
OA,  Multiple sites, Tylenol bid,  Tramadol bid/prn available to her.

## 2021-02-01 NOTE — Assessment & Plan Note (Signed)
Avoid constipation, reduction with gentle pressure if needed.

## 2021-02-01 NOTE — Assessment & Plan Note (Signed)
Depression/anxiety: takes Risperdal 1mg  qhs, Depakote, Duloxetine, Clonazepam prn,  TSH 2.32 11/13/20

## 2021-02-01 NOTE — Assessment & Plan Note (Signed)
takes Colace, MiraLax 

## 2021-02-01 NOTE — Assessment & Plan Note (Signed)
Blood pressure is controlled, takes Lisinopril, Furosemide, ASA  Bun/creat 20/1.0 11/13/20

## 2021-02-01 NOTE — Assessment & Plan Note (Addendum)
Edema, dependent, usually seen in legs when sitting up too long, minimal, poor oral intake,  will decrease Furosemide to 15m qd. Bun/creat 20/1.0, eGFR 52 11/13/20

## 2021-02-01 NOTE — Progress Notes (Addendum)
Location:   [facility] Room Number: [phone] Place of Service:  SNF (31) Provider:  Dexter Sauser Otho Darner, NP    Patient Care Team: [name], MD as PCP - General (Internal Medicine) Corlette Ciano X, NP as Nurse Practitioner (Internal Medicine) [name], MD (Internal Medicine)  Extended Emergency Contact Information Primary Emergency Contact: [facility] Address: [address]          [place], [place] [address] [name] of [place] Phone: [phone] Relation: Son Secondary Emergency Contact: [facility] Address: [address] [place] [address] [name] Phone: [phone] Relation: Daughter  Code Status:  DNR Goals of care: Advanced Directive information Advanced Directives 02/26/2021  Does Patient Have a Medical Advance Directive? Yes  Type of Paramedic of [place];Living will;Out of facility DNR (pink MOST or yellow form)  Does patient want to make changes to medical advance directive? No - Patient declined  Copy of [place] in Chart? Yes - validated most recent copy scanned in chart (See row information)  Would patient like information on creating a medical advance directive? -  Pre-existing out of facility DNR order (yellow form or pink MOST form) Yellow form placed in chart (order not valid for inpatient use);Pink MOST form placed in chart (order not valid for inpatient use)     Chief Complaint  Patient presents with   Acute Visit    Patient presents today after a fall.     HPI:  Pt is a 87 y.o. female seen today for an acute visit for reported fall when the patient was found on the floor, no apparent injury.     Tested positive COVID. The patient is in her usual state of health except mild nasal congestion. Her goal of care is comfort measures, under Hospice service. Taking Vit C, Vit D, Zinc supplemented              Pressure ulcer R+L heel/distal calcaneal  tendon region, R>L              Edema, dependent, usually seen in legs when sitting up too long, takes Furosemide. Bun/creat 20/1.0, eGFR 52 11/13/20             Constipation, takes Colace, MiraLax.             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 TSH 2.32 11/13/20             OA,  Multiple sites, Tylenol bid,  Tramadol bid/prn available to her.     Past Medical History:  Diagnosis Date   Alzheimer disease ([place] 10/27/2016   Arthritis    Confusion 04/04/2019   Depression, recurrent ([place] 10/27/2016   History of fracture of right hip 04/04/2019   07/01/19 Ortho, R hip incision healed, X-ray looks good, s/p IMN R hip fx, WBAT, f/u 6 mos.    HTN (hypertension) 10/27/2016   Hypertension  Hypertensive kidney disease with CKD (chronic kidney disease) stage V ([place]) 11/02/2016   11/02/16 Na 136, K 4.7, Bun 30, creat 1.52, TSH 0.86, wbc 6.7, Hgb 11.3, plt 264   Hypothyroidism 10/27/2016   Osteoarthritis 10/27/2016   Thyroid disease    Past Surgical History:  Procedure Laterality Date   ABDOMINAL HYSTERECTOMY  1986   INTRAMEDULLARY (IM) NAIL INTERTROCHANTERIC Right 04/04/2019   Procedure: INTRAMEDULLARY (IM) NAIL INTERTROCHANTRIC;  Surgeon: Rod Can, MD;  [facility];  Service: Orthopedics;  Laterality: Right;   KNEE ARTHROSCOPY  2015/2017   Dr. Gustavus Bryant   SPINE SURGERY  2009, 2011   Dr.Mark Nicoletta Dress   TIBIA IM NAIL INSERTION Right 01/01/2020   Procedure: INTRAMEDULLARY (IM) NAIL TIBIAL;  Surgeon: Shona Needles, MD;  Location: [facility];  Service: Orthopedics;  Laterality: Right;    No Known Allergies  Allergies as of 02/01/2021   No Known Allergies      Medication List         Accurate as of February 01, 2021 11:59 PM. If you have any questions, ask your nurse or doctor.          acetaminophen 325 MG tablet Commonly known as: TYLENOL Take 650 mg by mouth 2 (two) times daily.   aspirin 81 MG chewable tablet Chew 81 mg by mouth daily.   clonazePAM 0.5 MG tablet Commonly known as: KLONOPIN Take 0.5 mg by mouth every 6 (six) hours as needed for anxiety.   clonazePAM 0.5 MG tablet Commonly known as: KLONOPIN Take 0.5 mg by mouth every 8 (eight) hours as needed for anxiety.   divalproex 125 MG DR tablet Commonly known as: DEPAKOTE Take 250 mg by mouth at bedtime.   DULoxetine 30 MG capsule Commonly known as: CYMBALTA Take 30 mg by mouth daily.   feeding supplement (PRO-STAT SUGAR FREE 64) Liqd Take 30 mLs by mouth daily. Mix with 4oz of juice   furosemide 20 MG tablet Commonly known as: LASIX Take 20 mg by mouth daily.   levothyroxine 25 MCG tablet Commonly known as: SYNTHROID Take 25 mcg by mouth daily before breakfast.   ondansetron 4 MG/5ML solution Commonly known as: ZOFRAN Take 4 mg by mouth daily as needed for nausea or vomiting.   polyethylene glycol 17 g packet Commonly known as: MIRALAX / GLYCOLAX Take 17 g by mouth daily.   potassium chloride 10 MEQ CR capsule Commonly known as: MICRO-K Take 10 mEq by mouth daily.   potassium chloride SA 20 MEQ tablet Commonly known as: KLOR-CON Take 20 mEq by mouth daily.   risperiDONE 1 MG tablet Commonly known as: RISPERDAL Take 1 mg by mouth at bedtime.   traMADol 50 MG tablet Commonly known as: ULTRAM Take 0.5 tablets (25 mg total) by mouth every 6 (six) hours as needed.   traMADol 50 MG tablet Commonly known as: ULTRAM Take 25 mg by mouth 2 (two) times daily.   vitamin C 1000 MG tablet Take 1,000 mg by mouth daily.   Vitamin D (Cholecalciferol) 25 MCG (1000 UT) Tabs Take 1 tablet by mouth daily.   Zinc 50 MG Tabs Take 1 tablet by mouth daily.        Review of  Systems  Constitutional:  Negative for appetite change, fatigue and fever.  HENT:  Positive for congestion, hearing loss and rhinorrhea. Negative for voice change.   Eyes:  Negative for visual disturbance.  Respiratory:  Negative for shortness of breath.   Cardiovascular:  Positive for leg swelling.  Gastrointestinal:  Negative for abdominal pain and constipation.       Hx of rectocele.   Genitourinary:  Negative for dysuria, frequency and urgency.  Musculoskeletal:  Positive for arthralgias and gait problem.       S/p R lower leg IM tibia  Skin:  Positive for wound. Negative for color change.  Neurological:  Negative for speech difficulty, weakness and headaches.       Dementia, lethargic.   Psychiatric/Behavioral:  Positive for agitation, behavioral problems, confusion and sleep disturbance. The patient is nervous/anxious.        Improving.    Immunization History  Administered Date(s) Administered   Influenza-Unspecified 05/29/2017, 05/20/2020   Moderna SARS-COV2 Booster Vaccination 01/05/2021   Moderna Sars-Covid-2 Vaccination 09/07/2019, 10/05/2019, 06/16/2020   Pneumococcal Conjugate-13 06/21/2017   Pneumococcal Polysaccharide-23 07/16/2018   Tdap 06/21/2017   Pertinent  Health Maintenance Due  Topic Date Due   DEXA SCAN  Completed   PNA vac Low Risk Adult  Completed   INFLUENZA VACCINE  Discontinued   Fall Risk  02/01/2021 04/27/2018 04/25/2017  Falls in the past year? 1 No No  Number falls in past yr: 0 - -  Injury with Fall? 1 - -  Risk for fall due to : History of fall(s) - -  Follow up Falls evaluation completed - -   Functional Status Survey:    Vitals:   02/01/21 1521  BP: (!) 156/78  Pulse: 84  Resp: 18  Temp: (!) 97.1 F (36.2 C)  SpO2: 99%  Weight: 147 lb (66.7 kg)  Height: 4' 11"  (1.499 m)   Body mass index is 29.69 kg/m. Physical Exam Vitals and nursing note reviewed.  Constitutional:      Appearance: Normal appearance.  HENT:     Head:  Normocephalic and atraumatic.     Nose: Congestion present. No rhinorrhea.     Mouth/Throat:     Mouth: Mucous membranes are moist.  Eyes:     Extraocular Movements: Extraocular movements intact.     Conjunctiva/sclera: Conjunctivae normal.     Pupils: Pupils are equal, round, and reactive to light.  Cardiovascular:     Rate and Rhythm: Normal rate and regular rhythm.     Heart sounds: No murmur heard.    Comments: DP pulses present R+L Pulmonary:     Effort: Pulmonary effort is normal.     Breath sounds: No rales.  Abdominal:     General: Bowel sounds are normal.     Palpations: Abdomen is soft.     Tenderness: no abdominal tenderness  Musculoskeletal:     Right lower leg: Edema present.     Left lower leg: Edema present.     Comments: R s/p IM rod for tibia fx. Trace to 1+edema BLE. Left facial, LUE dependent edema from sleeping on the left sides of her face  Skin:    General: Skin is warm and dry.     Comments: Stage 2 pressure ulcer R+L heel/distal calcaneal tendon, a dime sized left heel, a quarter sized right heel, no s/s of infection, a off while colored tissue covered wound beds.   Neurological:     General: No focal deficit present.     Mental Status: She is alert.     Gait: Gait abnormal.  Psychiatric:        Mood and Affect: Mood normal.     Comments: Followed simple directions during today's examination, but confused.     Labs reviewed: Recent Labs  05/15/20 0000 06/03/20 0000 11/13/20 1325  NA 142 139 139  K 5.1 4.9 4.5  CL 107 102 103  CO2 25* 28* 27*  BUN 33* 25* 20  CREATININE 1.3* 1.3* 1.0  CALCIUM 10.5 10.5 10.0   Recent Labs    03/12/20 0000 04/10/20 0000 11/13/20 1325  AST 9* 10* 9*  ALT 6* 7 7  ALKPHOS 107 98 70  ALBUMIN 3.5 3.8 3.6   Recent Labs    03/12/20 0000 04/10/20 0000 11/13/20 1325  WBC 7.2 7.9 8.2  NEUTROABS 3,902 4,487  --   HGB 11.0* 11.2* 11.0*  HCT 34* 34* 34*  PLT 234 238 288   Lab Results  Component Value  Date   TSH 2.32 11/13/2020   Lab Results  Component Value Date   HGBA1C 5.3 11/01/2016   No results found for: CHOL, HDL, LDLCALC, LDLDIRECT, TRIG, CHOLHDL  Significant Diagnostic Results in last 30 days:  No results found.  Assessment/Plan Recurrent falls reported fall when the patient was found on the floor, no apparent injury. The patient has limited safety awareness, she is impulsive, transfers self w/o calling for assistance. Close supervision/assistance needed for safety.   COVID-19 virus infection Tested positive COVID. The patient is in her usual state of health except mild nasal congestion. Her goal of care is comfort measures, under Hospice service. Taking Vit C, Vit D, Zinc supplemented   Pressure ulcer of both heels, unstageable (HCC) Pressure ulcer R+L heel/distal calcaneal tendon region, R>L   Dependent edema Edema, dependent, usually seen in legs when sitting up too long, minimal, poor oral intake,  will decrease Furosemide to 21m qd. Bun/creat 20/1.0, eGFR 52 11/13/20  Slow transit constipation takes Colace, MiraLax.  Rectocele Avoid constipation, reduction with gentle pressure if needed.   Mixed Alzheimer's and vascular dementia ([place]  impulsive, no safety awareness, off  Memantine, resides in SNF [facility]  Anxiety and depression Depression/anxiety: takes Risperdal 160mqhs, Depakote, Duloxetine, Clonazepam prn,  TSH 2.32 11/13/20  Essential hypertension Blood pressure is controlled, takes Lisinopril, Furosemide, ASA  Bun/creat 20/1.0 11/13/20   Hypothyroidism takes Levothyroxine. TSH 2.32 11/13/20  Generalized osteoarthritis of multiple sites OA,  Multiple sites, Tylenol bid,  Tramadol bid/prn available to her.     Family/ staff Communication: plan of care reviewed with the patient and charge nurse.   Labs/tests ordered:  none  Time spend 25 minutes.

## 2021-02-01 NOTE — Assessment & Plan Note (Signed)
impulsive, no safety awareness, off  Memantine, resides in SNF [facility]

## 2021-02-01 NOTE — Assessment & Plan Note (Signed)
Pressure ulcer R+L heel/distal calcaneal tendon region, R>L

## 2021-02-01 NOTE — Assessment & Plan Note (Signed)
reported fall when the patient was found on the floor, no apparent injury. The patient has limited safety awareness, she is impulsive, transfers self w/o calling for assistance. Close supervision/assistance needed for safety.

## 2021-02-02 ENCOUNTER — Encounter: Payer: Self-pay | Admitting: Nurse Practitioner

## 2021-02-12 ENCOUNTER — Other Ambulatory Visit: Payer: Self-pay | Admitting: *Deleted

## 2021-02-12 MED ORDER — CLONAZEPAM 0.5 MG PO TABS: 0.5000 mg | ORAL_TABLET | Freq: Four times a day (QID) | ORAL | 0 refills | Status: DC | PRN

## 2021-02-12 NOTE — Telephone Encounter (Signed)
Pharmacy faxed refill Request Pended Rx and sent to [facility] for approval.

## 2021-02-19 ENCOUNTER — Encounter: Payer: Self-pay | Admitting: Nurse Practitioner

## 2021-02-19 ENCOUNTER — Non-Acute Institutional Stay (SKILLED_NURSING_FACILITY): Payer: Medicare Other | Admitting: Nurse Practitioner

## 2021-02-19 DIAGNOSIS — U071 COVID-19: Secondary | ICD-10-CM

## 2021-02-19 DIAGNOSIS — L740 Miliaria rubra: Secondary | ICD-10-CM | POA: Insufficient documentation

## 2021-02-19 DIAGNOSIS — K5901 Slow transit constipation: Secondary | ICD-10-CM

## 2021-02-19 DIAGNOSIS — R609 Edema, unspecified: Secondary | ICD-10-CM | POA: Diagnosis not present

## 2021-02-19 DIAGNOSIS — G309 Alzheimer's disease, unspecified: Secondary | ICD-10-CM

## 2021-02-19 DIAGNOSIS — L89610 Pressure ulcer of right heel, unstageable: Secondary | ICD-10-CM

## 2021-02-19 DIAGNOSIS — F0280 Dementia in other diseases classified elsewhere without behavioral disturbance: Secondary | ICD-10-CM

## 2021-02-19 DIAGNOSIS — F419 Anxiety disorder, unspecified: Secondary | ICD-10-CM

## 2021-02-19 DIAGNOSIS — I10 Essential (primary) hypertension: Secondary | ICD-10-CM

## 2021-02-19 DIAGNOSIS — L89892 Pressure ulcer of other site, stage 2: Secondary | ICD-10-CM

## 2021-02-19 DIAGNOSIS — M159 Polyosteoarthritis, unspecified: Secondary | ICD-10-CM

## 2021-02-19 DIAGNOSIS — F0150 Vascular dementia without behavioral disturbance: Secondary | ICD-10-CM

## 2021-02-19 DIAGNOSIS — E039 Hypothyroidism, unspecified: Secondary | ICD-10-CM

## 2021-02-19 DIAGNOSIS — N816 Rectocele: Secondary | ICD-10-CM

## 2021-02-19 DIAGNOSIS — L89620 Pressure ulcer of left heel, unstageable: Secondary | ICD-10-CM

## 2021-02-19 DIAGNOSIS — F32A Depression, unspecified: Secondary | ICD-10-CM

## 2021-02-19 NOTE — Assessment & Plan Note (Signed)
Tested positive COVID. Supportive care, under Hospice service.

## 2021-02-19 NOTE — Assessment & Plan Note (Signed)
Blood pressure is controlled, takes Lisinopril, Furosemide, ASA  Bun/creat 20/1.0 11/13/20

## 2021-02-19 NOTE — Assessment & Plan Note (Signed)
Pressure ulcer R+L heel/distal calcaneal tendon region, R>L

## 2021-02-19 NOTE — Assessment & Plan Note (Signed)
takes Levothyroxine. TSH 2.32 11/13/20

## 2021-02-19 NOTE — Assessment & Plan Note (Signed)
Stable, takes Colace, MiraLax 

## 2021-02-19 NOTE — Progress Notes (Addendum)
Location:   [facility] Room Number: [phone] Place of Service:  SNF (31) Provider: [facility] Dezhane Staten NP  [name], MD  Patient Care Team: [name], MD as PCP - General (Internal Medicine) Georgian Mcclory X, NP as Nurse Practitioner (Internal Medicine) [name], MD (Internal Medicine)  Extended Emergency Contact Information Primary Emergency Contact: [facility] Address: [address]          [place], [place] [address] [name] of [place] Phone: [phone] Relation: Son Secondary Emergency Contact: [facility] Address: [address] [place] [address] [name] Phone: [phone] Relation: Daughter  Code Status: DNR Goals of care: Advanced Directive information Advanced Directives 02/26/2021  Does Patient Have a Medical Advance Directive? Yes  Type of Paramedic of [place];Living will;Out of facility DNR (pink MOST or yellow form)  Does patient want to make changes to medical advance directive? No - Patient declined  Copy of [place] in Chart? Yes - validated most recent copy scanned in chart (See row information)  Would patient like information on creating a medical advance directive? -  Pre-existing out of facility DNR order (yellow form or pink MOST form) Yellow form placed in chart (order not valid for inpatient use);Pink MOST form placed in chart (order not valid for inpatient use)     Chief Complaint  Patient presents with   Acute Visit    Patient complains of rash on thighs     HPI:  Pt is a 87 y.o. female seen today for an acute visit for mottled head rash R+L thigh, the patient pressing her upper body on the thighs to rest.    Tested positive COVID. Supportive care, under Hospice service.              Pressure ulcer R+L heel/distal calcaneal tendon region, R>L              Edema, dependent, usually seen in legs when sitting up too long, takes Furosemide.  Bun/creat 20/1.0, eGFR 52 11/13/20             Constipation, takes Colace, MiraLax.             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 TSH 2.32 11/13/20             OA,  Multiple sites, Tylenol bid,  Tramadol bid/prn available to her.  Past Medical History:  Diagnosis Date   Alzheimer disease ([place] 10/27/2016   Arthritis    Confusion 04/04/2019   Depression, recurrent ([place] 10/27/2016   History of fracture of right hip 04/04/2019   07/01/19 Ortho, R hip incision healed, X-ray looks good, s/p IMN R hip fx, WBAT, f/u 6 mos.    HTN (hypertension) 10/27/2016   Hypertension    Hypertensive kidney disease with CKD (chronic kidney disease) stage V ([place] 11/02/2016   11/02/16 Na 136, K 4.7, Bun 30, creat 1.52, TSH 0.86, wbc  6.7, Hgb 11.3, plt 264   Hypothyroidism 10/27/2016   Osteoarthritis 10/27/2016   Thyroid disease    Past Surgical History:  Procedure Laterality Date   ABDOMINAL HYSTERECTOMY  1986   INTRAMEDULLARY (IM) NAIL INTERTROCHANTERIC Right 04/04/2019   Procedure: INTRAMEDULLARY (IM) NAIL INTERTROCHANTRIC;  Surgeon: Rod Can, MD;  [facility];  Service: Orthopedics;  Laterality: Right;   KNEE ARTHROSCOPY  2015/2017   Dr. Gustavus Bryant   SPINE SURGERY  2009, 2011   Dr.Mark Nicoletta Dress   TIBIA IM NAIL INSERTION Right 01/01/2020   Procedure: INTRAMEDULLARY (IM) NAIL TIBIAL;  Surgeon: Shona Needles, MD;  Location: [place];  Service: Orthopedics;  Laterality: Right;    No Known Allergies  Allergies as of 02/19/2021   No Known Allergies      Medication List        Accurate as of February 19, 2021 11:59 PM. If you have any questions, ask your nurse or doctor.           acetaminophen 325 MG tablet Commonly known as: TYLENOL Take 650 mg by mouth 2 (two) times daily.   aspirin 81 MG chewable tablet Chew 81 mg by mouth daily.   clonazePAM 0.5 MG tablet Commonly known as: KLONOPIN Take 0.5 mg by mouth every 8 (eight) hours as needed for anxiety.   clonazePAM 0.5 MG tablet Commonly known as: KLONOPIN Take 1 tablet (0.5 mg total) by mouth every 6 (six) hours as needed for anxiety.   divalproex 125 MG DR tablet Commonly known as: DEPAKOTE Take 250 mg by mouth at bedtime.   DULoxetine 30 MG capsule Commonly known as: CYMBALTA Take 30 mg by mouth daily.   feeding supplement (PRO-STAT SUGAR FREE 64) Liqd Take 30 mLs by mouth daily. Mix with 4oz of juice   furosemide 20 MG tablet Commonly known as: LASIX Take 20 mg by mouth daily.   levothyroxine 25 MCG tablet Commonly known as: SYNTHROID Take 25 mcg by mouth daily before breakfast.   ondansetron 4 MG/5ML solution Commonly known as: ZOFRAN Take 4 mg by mouth daily as needed for nausea or vomiting.   polyethylene glycol 17 g packet Commonly known as: MIRALAX / GLYCOLAX Take 17 g by mouth daily.   potassium chloride 10 MEQ CR capsule Commonly known as: MICRO-K Take 10 mEq by mouth daily.   potassium chloride SA 20 MEQ tablet Commonly known as: KLOR-CON Take 20 mEq by mouth daily.   risperiDONE 1 MG tablet Commonly known as: RISPERDAL Take 1 mg by mouth at bedtime.   traMADol 50 MG tablet Commonly known as: ULTRAM Take 0.5 tablets (25 mg total) by mouth every 6 (six) hours as needed.   traMADol 50 MG tablet Commonly known as: ULTRAM Take 25 mg by mouth 2 (two) times daily.   vitamin C 1000 MG tablet Take 1,000 mg by mouth daily.   Vitamin D (Cholecalciferol) 25 MCG (1000 UT) Tabs Take 1 tablet by mouth daily.   Zinc 50 MG Tabs Take 1 tablet by mouth daily.        Review of Systems  Constitutional:  Negative for appetite change, fatigue and fever.  HENT:  Positive  for congestion, hearing loss and rhinorrhea. Negative for voice change.   Eyes:  Negative for visual disturbance.  Respiratory:  Negative for shortness of breath.   Cardiovascular:  Positive for leg swelling.  Gastrointestinal:  Negative for abdominal pain and constipation.       Hx of rectocele.   Genitourinary:  Negative  for dysuria, frequency and urgency.  Musculoskeletal:  Positive for arthralgias and gait problem.       S/p R lower leg IM tibia  Skin:  Positive for rash and wound. Negative for color change.  Neurological:  Negative for speech difficulty, weakness and headaches.       Dementia, lethargic.   Psychiatric/Behavioral:  Positive for agitation, behavioral problems, confusion and sleep disturbance. The patient is nervous/anxious.        Improving.    Immunization History  Administered Date(s) Administered   Influenza-Unspecified 05/29/2017, 05/20/2020   Moderna SARS-COV2 Booster Vaccination 01/05/2021   Moderna Sars-Covid-2 Vaccination 09/07/2019, 10/05/2019, 06/16/2020   Pneumococcal Conjugate-13 06/21/2017   Pneumococcal Polysaccharide-23 07/16/2018   Tdap 06/21/2017   Pertinent  Health Maintenance Due  Topic Date Due   DEXA SCAN  Completed   PNA vac Low Risk Adult  Completed   INFLUENZA VACCINE  Discontinued   Fall Risk  02/01/2021 04/27/2018 04/25/2017  Falls in the past year? 1 No No  Number falls in past yr: 0 - -  Injury with Fall? 1 - -  Risk for fall due to : History of fall(s) - -  Follow up Falls evaluation completed - -   Functional Status Survey:    Vitals:   02/19/21 1649  BP: 131/82  Pulse: 76  Resp: 18  Temp: (!) 97.4 F (36.3 C)  SpO2: 96%  Weight: 141 lb (64 kg)  Height: _0  (1.499 m)   Body mass index is 28.48 kg/m. Physical Exam Vitals and nursing note reviewed.  Constitutional:      Appearance: Normal appearance.  HENT:     Head: Normocephalic and atraumatic.     Nose: Congestion present. No rhinorrhea.     Mouth/Throat:      Mouth: Mucous membranes are moist.  Eyes:     Extraocular Movements: Extraocular movements intact.     Conjunctiva/sclera: Conjunctivae normal.     Pupils: Pupils are equal, round, and reactive to light.  Cardiovascular:     Rate and Rhythm: Normal rate and regular rhythm.     Heart sounds: No murmur heard.    Comments: DP pulses present R+L Pulmonary:     Effort: Pulmonary effort is normal.     Breath sounds: No rales.  Abdominal:     General: Bowel sounds are normal.     Palpations: Abdomen is soft.     Tenderness: There is no abdominal tenderness.  Musculoskeletal:     Right lower leg: Edema present.     Left lower leg: Edema present.     Comments: R s/p IM rod for tibia fx. Trace to 1+edema BLE. Left facial, LUE dependent edema from sleeping on the left sides of her face  Skin:    General: Skin is warm and dry.     Findings: Rash present.     Comments: Stage 2 pressure ulcer R+L heel/distal calcaneal tendon covered in dressing.  Mottled skin heat rash hives on the anterior R+L thigh  Neurological:     General: No focal deficit present.     Mental Status: She is alert.     Gait: Gait abnormal.  Psychiatric:        Mood and Affect: Mood normal.     Comments: Followed simple directions during today's examination, but confused.     Labs reviewed: Recent Labs    05/15/20 0000 06/03/20 0000 11/13/20 1325  NA 142 139 139  K 5.1 4.9 4.5  CL 107  102 103  CO2 25* 28* 27*  BUN 33* 25* 20  CREATININE 1.3* 1.3* 1.0  CALCIUM 10.5 10.5 10.0   Recent Labs    03/12/20 0000 04/10/20 0000 11/13/20 1325  AST 9* 10* 9*  ALT 6* 7 7  ALKPHOS 107 98 70  ALBUMIN 3.5 3.8 3.6   Recent Labs    03/12/20 0000 04/10/20 0000 11/13/20 1325  WBC 7.2 7.9 8.2  NEUTROABS 3,902 4,487  --   HGB 11.0* 11.2* 11.0*  HCT 34* 34* 34*  PLT 234 238 288   Lab Results  Component Value Date   TSH 2.32 11/13/2020   Lab Results  Component Value Date   HGBA1C 5.3 11/01/2016   No  results found for: CHOL, HDL, LDLCALC, LDLDIRECT, TRIG, CHOLHDL  Significant Diagnostic Results in last 30 days:  No results found.  Assessment/Plan: Heat rash Mottled skin heat rash hives on the anterior R+L thigh, will apply 2% Hydrocortisone cream bid x 7 days.   COVID-19 virus infection Tested positive COVID. Supportive care, under Hospice service.   Pressure ulcer of both feet, stage 2 (HCC) Pressure ulcer R+L heel/distal calcaneal tendon region, R>L   Dependent edema Pressure ulcer R+L heel/distal calcaneal tendon region, R>L   Slow transit constipation Stable, takes Colace, MiraLax.  Rectocele Rectocele avoid constipation  Mixed Alzheimer's and vascular dementia ([place])  impulsive, no safety awareness, off  Memantine, resides in SNF [facility]  Anxiety and depression Depression/anxiety: takes Risperdal 3m qhs, Depakote, Duloxetine, Clonazepam prn,  TSH 2.32 11/13/20  Essential hypertension Blood pressure is controlled, takes Lisinopril, Furosemide, ASA  Bun/creat 20/1.0 11/13/20   Hypothyroidism takes Levothyroxine. TSH 2.32 11/13/20  Generalized osteoarthritis of multiple sites Multiple sites, Tylenol bid,  Tramadol bid/prn available to her.     Family/ staff Communication: plan of care reviewed with the patient and charge nurse.   Labs/tests ordered:  none  Time spend 25 minutes.

## 2021-02-19 NOTE — Assessment & Plan Note (Signed)
Multiple sites, Tylenol bid,  Tramadol bid/prn available to her.

## 2021-02-19 NOTE — Assessment & Plan Note (Signed)
Rectocele avoid constipation 

## 2021-02-19 NOTE — Assessment & Plan Note (Signed)
Depression/anxiety: takes Risperdal 1mg  qhs, Depakote, Duloxetine, Clonazepam prn,  TSH 2.32 11/13/20

## 2021-02-19 NOTE — Assessment & Plan Note (Signed)
Mottled skin heat rash hives on the anterior R+L thigh, will apply 2% Hydrocortisone cream bid x 7 days.

## 2021-02-19 NOTE — Assessment & Plan Note (Signed)
impulsive, no safety awareness, off  Memantine, resides in SNF [facility]

## 2021-02-22 ENCOUNTER — Encounter: Payer: Self-pay | Admitting: Nurse Practitioner

## 2021-02-23 ENCOUNTER — Non-Acute Institutional Stay (SKILLED_NURSING_FACILITY): Admitting: Internal Medicine

## 2021-02-23 DIAGNOSIS — F339 Major depressive disorder, recurrent, unspecified: Secondary | ICD-10-CM

## 2021-02-23 DIAGNOSIS — G309 Alzheimer's disease, unspecified: Secondary | ICD-10-CM | POA: Diagnosis not present

## 2021-02-23 DIAGNOSIS — E039 Hypothyroidism, unspecified: Secondary | ICD-10-CM

## 2021-02-23 DIAGNOSIS — L89892 Pressure ulcer of other site, stage 2: Secondary | ICD-10-CM | POA: Diagnosis not present

## 2021-02-23 DIAGNOSIS — R600 Localized edema: Secondary | ICD-10-CM | POA: Diagnosis not present

## 2021-02-23 DIAGNOSIS — R296 Repeated falls: Secondary | ICD-10-CM

## 2021-02-23 DIAGNOSIS — F0280 Dementia in other diseases classified elsewhere without behavioral disturbance: Secondary | ICD-10-CM

## 2021-02-23 DIAGNOSIS — I10 Essential (primary) hypertension: Secondary | ICD-10-CM

## 2021-02-23 DIAGNOSIS — F0150 Vascular dementia without behavioral disturbance: Secondary | ICD-10-CM

## 2021-02-26 ENCOUNTER — Encounter: Payer: Self-pay | Admitting: Internal Medicine

## 2021-02-26 NOTE — Progress Notes (Signed)
Location:   [place] Room Number: 42 Place of Service:  SNF [phone]) Provider:  Veleta Miners MD  [name], MD  Patient Care Team: [name], MD as PCP - General (Internal Medicine) Mast, Man X, NP as Nurse Practitioner (Internal Medicine) [name], MD (Internal Medicine)  Extended Emergency Contact Information Primary Emergency Contact: [facility] Address: [address]          [place], [place] [address] [name] of [place] Phone: [phone] Relation: Son Secondary Emergency Contact: [facility] Address: [address] [place] [address] [name] Phone: [phone] Relation: Daughter  Code Status:  DNR Hospice Goals of care: Advanced Directive information Advanced Directives 02/26/2021  Does Patient Have a Medical Advance Directive? Yes  Type of Paramedic of [place];Living will;Out of facility DNR (pink MOST or yellow form)  Does patient want to make changes to medical advance directive? No - Patient declined  Copy of [place] in Chart? Yes - validated most recent copy scanned in chart (See row information)  Would patient like information on creating a medical advance directive? -  Pre-existing out of facility DNR order (yellow form or pink MOST form) Yellow form placed in chart (order not valid for inpatient use);Pink MOST form placed in chart (order not valid for inpatient use)     Chief Complaint  Patient presents with   Medical Management of Chronic Issues   Health Maintenance    Shingrix    HPI:  Pt is a 87 y.o. female seen today for medical management of chronic diseases.    Patient is long term care resident Patient  has h/o Hypertension,  Hypothyroidism , Alzheimer's Dementia with behavior issues, Depression, B12 def, S/P Right Hip fracture with Intramedullary Fixation in 8/20 H/O Colitis in 06/21 and Right Tibia Fibula  Fracture in 5/21 due to fall S/P IM Placement   Patient has a history of progressive dementia. Now enrolled in Hospice Recent recover from Asymptomatic Covid Also has 2 pressure wounds in her both Heels Also Has Edema Worsening in her Face and Feet. Has not gained weight denies any SOB or distress Also Developed rash in her inner Thighs but they are not resolved She is usually in her Wheelchair. Very high risk for falls Gets very aggressive with her care    Past Medical History:  Diagnosis Date   Alzheimer disease ([place]) 10/27/2016   Arthritis    Confusion 04/04/2019   Depression, recurrent ([place]) 10/27/2016   History of fracture of right hip 04/04/2019   07/01/19 Ortho, R hip incision healed, X-ray looks good, s/p IMN R hip fx, WBAT, f/u 6 mos.    HTN (hypertension) 10/27/2016   Hypertension    Hypertensive kidney disease with CKD (chronic kidney disease) stage V ([place]) 11/02/2016   11/02/16 Na 136, K 4.7, Bun 30, creat 1.52, TSH 0.86, wbc 6.7, Hgb 11.3, plt 264   Hypothyroidism 10/27/2016   Osteoarthritis 10/27/2016   Thyroid disease    Past Surgical History:  Procedure Laterality Date   ABDOMINAL HYSTERECTOMY  1986   INTRAMEDULLARY (IM) NAIL INTERTROCHANTERIC Right 04/04/2019   Procedure: INTRAMEDULLARY (IM) NAIL INTERTROCHANTRIC;  Surgeon: Rod Can, MD;  Location: [facility];  Service: Orthopedics;  Laterality: Right;   KNEE ARTHROSCOPY  2015/2017   Dr. Gustavus Bryant   SPINE SURGERY  2009, 2011   Dr.Mark Nicoletta Dress   TIBIA IM NAIL INSERTION  Right 01/01/2020   Procedure: INTRAMEDULLARY (IM) NAIL TIBIAL;  Surgeon: Shona Needles, MD;  Location: [place];  Service: Orthopedics;  Laterality: Right;    No Known Allergies  Allergies as of 02/23/2021   No Known Allergies      Medication List        Accurate as of February 23, 2021 11:59 PM. If you have any questions, ask your nurse or doctor.          STOP taking these medications    potassium chloride SA 20 MEQ tablet Commonly known  as: KLOR-CON   vitamin C 1000 MG tablet   Vitamin D (Cholecalciferol) 25 MCG (1000 UT) Tabs   Zinc 50 MG Tabs       TAKE these medications    acetaminophen 325 MG tablet Commonly known as: TYLENOL Take 650 mg by mouth 2 (two) times daily.   aspirin 81 MG chewable tablet Chew 81 mg by mouth daily.   clonazePAM 0.5 MG tablet Commonly known as: KLONOPIN Take 1 tablet (0.5 mg total) by mouth every 6 (six) hours as needed for anxiety. What changed: Another medication with the same name was removed. Continue taking this medication, and follow the directions you see here.   divalproex 125 MG DR tablet Commonly known as: DEPAKOTE Take 250 mg by mouth at bedtime.   DULoxetine 30 MG capsule Commonly known as: CYMBALTA Take 30 mg by mouth daily.   feeding supplement (PRO-STAT SUGAR FREE 64) Liqd Take 30 mLs by mouth daily. Mix with 4oz of juice   furosemide 20 MG tablet Commonly known as: LASIX Take 20 mg by mouth daily.   furosemide 40 MG tablet Commonly known as: LASIX Take 40 mg by mouth daily. 40 mg daily x 2 weeks, then return to 20 mg daily.   hydrocortisone 2.5 % cream Apply topically 2 (two) times daily. Apply BID to right & left thigh rash x 1 week. (Notify MD/NP if no improvement.)   levothyroxine 25 MCG tablet Commonly known as: SYNTHROID Take 25 mcg by mouth daily before breakfast.   ondansetron 4 MG/5ML solution Commonly known as: ZOFRAN Take 4 mg by mouth daily as needed for nausea or vomiting.   polyethylene glycol 17 g packet Commonly known as: MIRALAX / GLYCOLAX Take 17 g by mouth daily.   potassium chloride 10 MEQ CR capsule Commonly known as: MICRO-K Take 10 mEq by mouth daily.   risperiDONE 1 MG tablet Commonly known as: RISPERDAL Take 1 mg by mouth at bedtime.   Therems-M Tabs Take 1 tablet by mouth daily at 6 (six) AM.   traMADol 50 MG tablet Commonly known as: ULTRAM Take 0.5 tablets (25 mg total) by mouth every 6 (six) hours as  needed.   traMADol 50 MG tablet Commonly known as: ULTRAM Take 25 mg by mouth 2 (two) times daily.        Review of Systems  Unable to perform ROS: Dementia   Immunization History  Administered Date(s) Administered   Influenza-Unspecified 05/29/2017, 05/20/2020   Moderna SARS-COV2 Booster Vaccination 01/05/2021   Moderna Sars-Covid-2 Vaccination 09/07/2019, 10/05/2019, 06/16/2020   Pneumococcal Conjugate-13 06/21/2017   Pneumococcal Polysaccharide-23 07/16/2018   Tdap 06/21/2017   Pertinent  Health Maintenance Due  Topic Date Due   DEXA SCAN  Completed   PNA vac Low Risk Adult  Completed   INFLUENZA VACCINE  Discontinued   Fall Risk  02/01/2021 04/27/2018 04/25/2017  Falls in the past year? 1 No No  Number falls  in past yr: 0 - -  Injury with Fall? 1 - -  Risk for fall due to : History of fall(s) - -  Follow up Falls evaluation completed - -   Functional Status Survey:    Vitals:   02/26/21 1147  BP: 133/86  Pulse: 82  Resp: 18  Temp: 98.1 F (36.7 C)  SpO2: 96%  Weight: 141 lb (64 kg)  Height: '4\' 11"'$  (1.499 m)   Body mass index is 28.48 kg/m. Physical Exam Vitals reviewed.  Constitutional:      Appearance: Normal appearance.  HENT:     Head: Normocephalic.     Comments: Her Face is swollen as she keeps her head down in her Wheelchair    Nose: Nose normal.     Mouth/Throat:     Mouth: Mucous membranes are moist.  Eyes:     Pupils: Pupils are equal, round, and reactive to light.  Cardiovascular:     Rate and Rhythm: Normal rate and regular rhythm.     Pulses: Normal pulses.  Pulmonary:     Effort: Pulmonary effort is normal.     Breath sounds: Normal breath sounds.  Abdominal:     General: Abdomen is flat. Bowel sounds are normal.     Palpations: Abdomen is soft.  Musculoskeletal:        General: Swelling present.     Cervical back: Neck supple.  Skin:    General: Skin is warm.     Comments: 2 Pressure Wounds in Her Heel. Patient would not let  us examine them. But they were dry and had some Slough in the center. nO redness or signs of infection  Neurological:     General: No focal deficit present.     Mental Status: She is alert.  Psychiatric:        Mood and Affect: Mood normal.        Thought Content: Thought content normal.    Labs reviewed: Recent Labs    05/15/20 0000 06/03/20 0000 11/13/20 1325  NA 142 139 139  K 5.1 4.9 4.5  CL 107 102 103  CO2 25* 28* 27*  BUN 33* 25* 20  CREATININE 1.3* 1.3* 1.0  CALCIUM 10.5 10.5 10.0   Recent Labs    03/12/20 0000 04/10/20 0000 11/13/20 1325  AST 9* 10* 9*  ALT 6* 7 7  ALKPHOS 107 98 70  ALBUMIN 3.5 3.8 3.6   Recent Labs    03/12/20 0000 04/10/20 0000 11/13/20 1325  WBC 7.2 7.9 8.2  NEUTROABS 3,902 4,487  --   HGB 11.0* 11.2* 11.0*  HCT 34* 34* 34*  PLT 234 238 288   Lab Results  Component Value Date   TSH 2.32 11/13/2020   Lab Results  Component Value Date   HGBA1C 5.3 11/01/2016   No results found for: CHOL, HDL, LDLCALC, LDLDIRECT, TRIG, CHOLHDL  Significant Diagnostic Results in last 30 days:  No results found.  Assessment/Plan  Bilateral leg edema Increase Lasix to 40 mg QD for 2 weeks It will also help with her Pressure Injury Reval If Edema persist will need increased Dose  Pressure ulcer of both feet, stage 2 (HCC) Hydrocolloid dressing And Heel Protectors Mixed Alzheimer's and vascular dementia ([place]) On Klonopin and Risperdal and Depakote Cannot do GDR due to her Behaviors Enrolled in Hospice Recurrent falls Supportive care Hypothyroidism, unspecified type TSH normal in 4/22 Depression, recurrent (HCC) On Cymbalta Essential hypertension Off Lisinopril now  Family/ staff Communication:  Labs/tests ordered:

## 2021-03-08 ENCOUNTER — Non-Acute Institutional Stay (SKILLED_NURSING_FACILITY): Payer: Medicare Other | Admitting: Nurse Practitioner

## 2021-03-08 ENCOUNTER — Encounter: Payer: Self-pay | Admitting: Nurse Practitioner

## 2021-03-08 DIAGNOSIS — I10 Essential (primary) hypertension: Secondary | ICD-10-CM

## 2021-03-08 DIAGNOSIS — U071 COVID-19: Secondary | ICD-10-CM

## 2021-03-08 DIAGNOSIS — L89610 Pressure ulcer of right heel, unstageable: Secondary | ICD-10-CM | POA: Diagnosis not present

## 2021-03-08 DIAGNOSIS — K5901 Slow transit constipation: Secondary | ICD-10-CM

## 2021-03-08 DIAGNOSIS — G309 Alzheimer's disease, unspecified: Secondary | ICD-10-CM

## 2021-03-08 DIAGNOSIS — F419 Anxiety disorder, unspecified: Secondary | ICD-10-CM

## 2021-03-08 DIAGNOSIS — F32A Depression, unspecified: Secondary | ICD-10-CM

## 2021-03-08 DIAGNOSIS — L89620 Pressure ulcer of left heel, unstageable: Secondary | ICD-10-CM

## 2021-03-08 DIAGNOSIS — N816 Rectocele: Secondary | ICD-10-CM

## 2021-03-08 DIAGNOSIS — E039 Hypothyroidism, unspecified: Secondary | ICD-10-CM

## 2021-03-08 DIAGNOSIS — R609 Edema, unspecified: Secondary | ICD-10-CM

## 2021-03-08 DIAGNOSIS — F0280 Dementia in other diseases classified elsewhere without behavioral disturbance: Secondary | ICD-10-CM

## 2021-03-08 DIAGNOSIS — M159 Polyosteoarthritis, unspecified: Secondary | ICD-10-CM

## 2021-03-08 DIAGNOSIS — F0150 Vascular dementia without behavioral disturbance: Secondary | ICD-10-CM

## 2021-03-08 NOTE — Assessment & Plan Note (Signed)
Multiple sites, Tylenol bid,  Tramadol bid/prn available to her.

## 2021-03-08 NOTE — Assessment & Plan Note (Signed)
impulsive, no safety awareness, off  [place], resides in SNF [facility]

## 2021-03-08 NOTE — Assessment & Plan Note (Signed)
takes Levothyroxine. TSH 2.32 11/13/20

## 2021-03-08 NOTE — Assessment & Plan Note (Signed)
reported heel wound concerns, seropurulent exudate with fould odor, no redness or warmth. Will apply Bactroban oint onto the wound beds, then cover with Hydrocolloid dressing daily until healed.

## 2021-03-08 NOTE — Assessment & Plan Note (Signed)
Blood pressure is controlled.  takes Furosemide, ASA  Bun/creat 20/1.0 11/13/20

## 2021-03-08 NOTE — Progress Notes (Signed)
Location:   [place] Room Number: [phone] Place of Service:  SNF (31) Provider:  Man Otho Darner, NP  [name], MD  Patient Care Team: [name], MD as PCP - General (Internal Medicine) Mast, Man X, NP as Nurse Practitioner (Internal Medicine) [name], MD (Internal Medicine)  Extended Emergency Contact Information Primary Emergency Contact: [facility] Address: [address]          [place], [place] [address] [name] of [place] Phone: [phone] Relation: Son Secondary Emergency Contact: [facility] Address: [address] [place] [address] [name] Phone: [phone] Relation: Daughter  Code Status:  DNR Goals of care: Advanced Directive information Advanced Directives 03/08/2021  Does Patient Have a Medical Advance Directive? Yes  Type of Paramedic of [place];Living will;Out of facility DNR (pink MOST or yellow form)  Does patient want to make changes to medical advance directive? No - Patient declined  Copy of [place] in Chart? Yes - validated most recent copy scanned in chart (See row information)  Would patient like information on creating a medical advance directive? -  Pre-existing out of facility DNR order (yellow form or pink MOST form) Yellow form placed in chart (order not valid for inpatient use)     Chief Complaint  Patient presents with   Acute Visit    Patient presents for pressure wounds    HPI:  Pt is a 87 y.o. female seen today for an acute visit for    Past Medical History:  Diagnosis Date   Alzheimer disease ([place]) 10/27/2016   Arthritis    Confusion 04/04/2019   Depression, recurrent ([place]) 10/27/2016   History of fracture of right hip 04/04/2019   07/01/19 Ortho, R hip incision healed, X-ray looks good, s/p IMN R hip fx, WBAT, f/u 6 mos.    HTN (hypertension) 10/27/2016   Hypertension    Hypertensive kidney  disease with CKD (chronic kidney disease) stage V ([place]) 11/02/2016   11/02/16 Na 136, K 4.7, Bun 30, creat 1.52, TSH 0.86, wbc 6.7, Hgb 11.3, plt 264   Hypothyroidism 10/27/2016   Osteoarthritis 10/27/2016   Thyroid disease    Past Surgical History:  Procedure Laterality Date   ABDOMINAL HYSTERECTOMY  1986   INTRAMEDULLARY (IM) NAIL INTERTROCHANTERIC Right 04/04/2019   Procedure: INTRAMEDULLARY (IM) NAIL INTERTROCHANTRIC;  Surgeon: Rod Can, MD;  [facility];  Service: Orthopedics;  Laterality: Right;   KNEE ARTHROSCOPY  2015/2017   Dr. Gustavus Bryant   SPINE SURGERY  2009, 2011   Dr.Mark Nicoletta Dress   TIBIA IM NAIL INSERTION Right 01/01/2020   Procedure: INTRAMEDULLARY (IM) NAIL TIBIAL;  Surgeon: Shona Needles, MD;  Location: [place];  Service: Orthopedics;  Laterality: Right;    No Known Allergies  Allergies as of 03/08/2021   No Known Allergies      Medication List        Accurate as of March 08, 2021  3:49 PM. If you have any questions, ask your nurse or doctor.          STOP taking these medications    clonazePAM 0.5 MG tablet Commonly known as: KLONOPIN Stopped by: Man X Mast, NP       TAKE these medications    acetaminophen 325 MG tablet Commonly known as: TYLENOL Take 650 mg by mouth 2 (two) times daily.   aspirin 81 MG chewable  tablet Chew 81 mg by mouth daily.   divalproex 125 MG DR tablet Commonly known as: DEPAKOTE Take 250 mg by mouth at bedtime.   DULoxetine 30 MG capsule Commonly known as: CYMBALTA Take 30 mg by mouth daily.   feeding supplement (PRO-STAT SUGAR FREE 64) Liqd Take 30 mLs by mouth daily. Mix with 4oz of juice   furosemide 20 MG tablet Commonly known as: LASIX Take 20 mg by mouth daily.   furosemide 40 MG tablet Commonly known as: LASIX Take 40 mg by mouth daily. 40 mg daily x 2 weeks, then return to 20 mg daily.   levothyroxine 25 MCG tablet Commonly known as: SYNTHROID Take 25 mcg by mouth daily before breakfast.    ondansetron 4 MG/5ML solution Commonly known as: ZOFRAN Take 4 mg by mouth daily as needed for nausea or vomiting.   polyethylene glycol 17 g packet Commonly known as: MIRALAX / GLYCOLAX Take 17 g by mouth daily.   potassium chloride 10 MEQ CR capsule Commonly known as: MICRO-K Take 10 mEq by mouth daily.   risperiDONE 1 MG tablet Commonly known as: RISPERDAL Take 1 mg by mouth at bedtime.   Therems-M Tabs Take 1 tablet by mouth daily at 6 (six) AM.   traMADol 50 MG tablet Commonly known as: ULTRAM Take 0.5 tablets (25 mg total) by mouth every 6 (six) hours as needed.   traMADol 50 MG tablet Commonly known as: ULTRAM Take 25 mg by mouth 2 (two) times daily.        Review of Systems  Immunization History  Administered Date(s) Administered   Influenza-Unspecified 05/29/2017, 05/20/2020   Moderna SARS-COV2 Booster Vaccination 01/05/2021   Moderna Sars-Covid-2 Vaccination 09/07/2019, 10/05/2019, 06/16/2020   Pneumococcal Conjugate-13 06/21/2017   Pneumococcal Polysaccharide-23 07/16/2018   Tdap 06/21/2017   Pertinent  Health Maintenance Due  Topic Date Due   DEXA SCAN  Completed   PNA vac Low Risk Adult  Completed   INFLUENZA VACCINE  Discontinued   Fall Risk  02/01/2021 04/27/2018 04/25/2017  Falls in the past year? 1 No No  Number falls in past yr: 0 - -  Injury with Fall? 1 - -  Risk for fall due to : History of fall(s) - -  Follow up Falls evaluation completed - -   Functional Status Survey:    Vitals:   03/08/21 1542  BP: 124/70  Pulse: 89  Resp: 14  Temp: 97.6 F (36.4 C)  SpO2: 97%  Weight: 141 lb (64 kg)  Height: '4\' 11"'$  (1.499 m)   Body mass index is 28.48 kg/m. Physical Exam  Labs reviewed: Recent Labs    05/15/20 0000 06/03/20 0000 11/13/20 1325  NA 142 139 139  K 5.1 4.9 4.5  CL 107 102 103  CO2 25* 28* 27*  BUN 33* 25* 20  CREATININE 1.3* 1.3* 1.0  CALCIUM 10.5 10.5 10.0   Recent Labs    03/12/20 0000 04/10/20 0000  11/13/20 1325  AST 9* 10* 9*  ALT 6* 7 7  ALKPHOS 107 98 70  ALBUMIN 3.5 3.8 3.6   Recent Labs    03/12/20 0000 04/10/20 0000 11/13/20 1325  WBC 7.2 7.9 8.2  NEUTROABS 3,902 4,487  --   HGB 11.0* 11.2* 11.0*  HCT 34* 34* 34*  PLT 234 238 288   Lab Results  Component Value Date   TSH 2.32 11/13/2020   Lab Results  Component Value Date   HGBA1C 5.3 11/01/2016   No results found  for: CHOL, HDL, LDLCALC, LDLDIRECT, TRIG, CHOLHDL  Significant Diagnostic Results in last 30 days:  No results found.  Assessment/Plan There are no diagnoses linked to this encounter.   Family/ staff Communication:   Labs/tests ordered:

## 2021-03-08 NOTE — Assessment & Plan Note (Signed)
takes Risperdal,  Depakote, Duloxetine, Clonazepam prn,  TSH 2.32 11/13/20

## 2021-03-08 NOTE — Assessment & Plan Note (Signed)
Tested positive COVID. Supportive care, under Hospice service.

## 2021-03-08 NOTE — Assessment & Plan Note (Signed)
Edema, dependent, persists,  increased Furosemide to '40mg'$  qd x2 weeks  since 02/23/21, will add '20mg'$  q 2pm and continue Furosemide '40mg'$  qd. Update BMP 03/11/21

## 2021-03-08 NOTE — Assessment & Plan Note (Signed)
avoid constipation 

## 2021-03-08 NOTE — Assessment & Plan Note (Signed)
takes MiraLax  

## 2021-03-08 NOTE — Progress Notes (Signed)
Location:   SNF [place] Room Number: [phone] Place of Service:  SNF (31) Provider: [facility] Chayna Surratt NP  [name], MD  Patient Care Team: [name], MD as PCP - General (Internal Medicine) Mackenzie Lia X, NP as Nurse Practitioner (Internal Medicine) [name], MD (Internal Medicine)  Extended Emergency Contact Information Primary Emergency Contact: [facility] Address: [address]          [place], [place] [address] [name] of [place] Phone: [phone] Relation: Son Secondary Emergency Contact: [facility] Address: [address] [place] [address] [name] Phone: [phone] Relation: Daughter  Code Status: DNR Goals of care: Advanced Directive information Advanced Directives 03/08/2021  Does Patient Have a Medical Advance Directive? Yes  Type of Paramedic of [place];Living will;Out of facility DNR (pink MOST or yellow form)  Does patient want to make changes to medical advance directive? No - Patient declined  Copy of [place] in Chart? Yes - validated most recent copy scanned in chart (See row information)  Would patient like information on creating a medical advance directive? -  Pre-existing out of facility DNR order (yellow form or pink MOST form) Yellow form placed in chart (order not valid for inpatient use)     Chief Complaint  Patient presents with   Acute Visit    Patient presents for pressure wounds    HPI:  Pt is a 87 y.o. female seen today for an acute visit for reported heel wound concerns, seropurulent exudate with foul odor, no redness or warmth.     Tested positive COVID. Supportive care, under Hospice service.              Pressure ulcer R+L heel/distal calcaneal tendon region, R>L              Edema, dependent, persists,  increased Furosemide to '40mg'$  qd since 02/23/21, will add '20mg'$  q 2pm 03/08/21             Constipation, takes MiraLax.              Rectocele avoid constipation             Dementia, impulsive, no safety awareness, off  Memantine, resides in SNF [facility]             Depression/anxiety: takes Risperdal,  Depakote, Duloxetine, Clonazepam prn,  TSH 2.32 11/13/20             HTN, takes Furosemide, ASA  Bun/creat 20/1.0 11/13/20              Hypothyroidism, takes Levothyroxine. TSH 2.32 11/13/20             OA,  Multiple sites, Tylenol bid,  Tramadol bid/prn available to her.   Past Medical History:  Diagnosis Date   Alzheimer disease ([place]) 10/27/2016   Arthritis    Confusion 04/04/2019   Depression, recurrent ([place]) 10/27/2016   History of fracture of right hip 04/04/2019   07/01/19 Ortho, R hip incision healed, X-ray looks good, s/p IMN R hip fx, WBAT, f/u 6 mos.    HTN (hypertension) 10/27/2016   Hypertension    Hypertensive kidney disease with CKD (chronic kidney disease) stage V (HCC) 11/02/2016   11/02/16 Na 136, K 4.7, Bun 30, creat 1.52, TSH 0.86, wbc 6.7, Hgb 11.3, plt 264   Hypothyroidism 10/27/2016   Osteoarthritis 10/27/2016   Thyroid disease  Past Surgical History:  Procedure Laterality Date   ABDOMINAL HYSTERECTOMY  1986   INTRAMEDULLARY (IM) NAIL INTERTROCHANTERIC Right 04/04/2019   Procedure: INTRAMEDULLARY (IM) NAIL INTERTROCHANTRIC;  Surgeon: Rod Can, MD;  [facility];  Service: Orthopedics;  Laterality: Right;   KNEE ARTHROSCOPY  2015/2017   Dr. Gustavus Bryant   SPINE SURGERY  2009, 2011   Dr.Mark Nicoletta Dress   TIBIA IM NAIL INSERTION Right 01/01/2020   Procedure: INTRAMEDULLARY (IM) NAIL TIBIAL;  Surgeon: Shona Needles, MD;  Location: [place];  Service: Orthopedics;  Laterality: Right;    No Known Allergies  Allergies as of 03/08/2021   No Known Allergies      Medication List        Accurate as of March 08, 2021 11:59 PM. If you have any questions, ask your nurse or doctor.          STOP taking these medications    clonazePAM 0.5 MG tablet Commonly known as: KLONOPIN Stopped by: Kamarius Buckbee X Mishawn Hemann,  NP       TAKE these medications    acetaminophen 325 MG tablet Commonly known as: TYLENOL Take 650 mg by mouth 2 (two) times daily.   aspirin 81 MG chewable tablet Chew 81 mg by mouth daily.   divalproex 125 MG DR tablet Commonly known as: DEPAKOTE Take 250 mg by mouth at bedtime.   DULoxetine 30 MG capsule Commonly known as: CYMBALTA Take 30 mg by mouth daily.   feeding supplement (PRO-STAT SUGAR FREE 64) Liqd Take 30 mLs by mouth daily. Mix with 4oz of juice   furosemide 20 MG tablet Commonly known as: LASIX Take 20 mg by mouth daily.   furosemide 40 MG tablet Commonly known as: LASIX Take 40 mg by mouth daily. 40 mg daily x 2 weeks, then return to 20 mg daily.   levothyroxine 25 MCG tablet Commonly known as: SYNTHROID Take 25 mcg by mouth daily before breakfast.   ondansetron 4 MG/5ML solution Commonly known as: ZOFRAN Take 4 mg by mouth daily as needed for nausea or vomiting.   polyethylene glycol 17 g packet Commonly known as: MIRALAX / GLYCOLAX Take 17 g by mouth daily.   potassium chloride 10 MEQ CR capsule Commonly known as: MICRO-K Take 10 mEq by mouth daily.   risperiDONE 1 MG tablet Commonly known as: RISPERDAL Take 1 mg by mouth at bedtime.   Therems-M Tabs Take 1 tablet by mouth daily at 6 (six) AM.   traMADol 50 MG tablet Commonly known as: ULTRAM Take 0.5 tablets (25 mg total) by mouth every 6 (six) hours as needed.   traMADol 50 MG tablet Commonly known as: ULTRAM Take 25 mg by mouth 2 (two) times daily.        Review of Systems  Constitutional:  Negative for appetite change, fatigue and fever.  HENT:  Positive for congestion, hearing loss and rhinorrhea. Negative for voice change.   Eyes:  Negative for visual disturbance.  Respiratory:  Negative for shortness of breath.   Cardiovascular:  Positive for leg swelling.  Gastrointestinal:  Negative for abdominal pain and constipation.       Hx of rectocele.   Genitourinary:   Negative for dysuria, frequency and urgency.  Musculoskeletal:  Positive for arthralgias and gait problem.       S/p R lower leg IM tibia  Skin:  Positive for wound. Negative for color change.  Neurological:  Negative for speech difficulty, weakness and headaches.       Dementia,  lethargic.   Psychiatric/Behavioral:  Positive for agitation, behavioral problems, confusion and sleep disturbance. The patient is nervous/anxious.        Improving.    Immunization History  Administered Date(s) Administered   Influenza-Unspecified 05/29/2017, 05/20/2020   Moderna SARS-COV2 Booster Vaccination 01/05/2021   Moderna Sars-Covid-2 Vaccination 09/07/2019, 10/05/2019, 06/16/2020   Pneumococcal Conjugate-13 06/21/2017   Pneumococcal Polysaccharide-23 07/16/2018   Tdap 06/21/2017   Pertinent  Health Maintenance Due  Topic Date Due   DEXA SCAN  Completed   PNA vac Low Risk Adult  Completed   INFLUENZA VACCINE  Discontinued   Fall Risk  02/01/2021 04/27/2018 04/25/2017  Falls in the past year? 1 No No  Number falls in past yr: 0 - -  Injury with Fall? 1 - -  Risk for fall due to : History of fall(s) - -  Follow up Falls evaluation completed - -   Functional Status Survey:    Vitals:   03/08/21 1542  BP: 124/70  Pulse: 89  Resp: 14  Temp: 97.6 F (36.4 C)  SpO2: 97%  Weight: 141 lb (64 kg)  Height: '4\' 11"'$  (1.499 m)   Body mass index is 28.48 kg/m. Physical Exam Vitals and nursing note reviewed.  Constitutional:      Appearance: Normal appearance.  HENT:     Head: Normocephalic and atraumatic.     Nose: Congestion present. No rhinorrhea.     Mouth/Throat:     Mouth: Mucous membranes are moist.  Eyes:     Extraocular Movements: Extraocular movements intact.     Conjunctiva/sclera: Conjunctivae normal.     Pupils: Pupils are equal, round, and reactive to light.  Cardiovascular:     Rate and Rhythm: Normal rate and regular rhythm.     Heart sounds: No murmur heard.    Comments:  DP pulses present R+L Pulmonary:     Effort: Pulmonary effort is normal.     Breath sounds: No rales.  Abdominal:     General: Bowel sounds are normal.     Palpations: Abdomen is soft.     Tenderness: There is no abdominal tenderness.  Musculoskeletal:     Right lower leg: Edema present.     Left lower leg: Edema present.     Comments: R s/p IM rod for tibia fx. Persisted 1-2+ edema BLE. Left facial, LUE dependent edema from sleeping on the left sides of her face  Skin:    General: Skin is warm and dry.     Comments: Unstageable  R+L heel/distal calcaneal tendon, size about a quarter, yellowish slough wound bed, no peri wound redness or warmth.   Neurological:     General: No focal deficit present.     Mental Status: She is alert.     Gait: Gait abnormal.  Psychiatric:        Mood and Affect: Mood normal.     Comments: Followed simple directions during today's examination, but confused.     Labs reviewed: Recent Labs    05/15/20 0000 06/03/20 0000 11/13/20 1325  NA 142 139 139  K 5.1 4.9 4.5  CL 107 102 103  CO2 25* 28* 27*  BUN 33* 25* 20  CREATININE 1.3* 1.3* 1.0  CALCIUM 10.5 10.5 10.0   Recent Labs    03/12/20 0000 04/10/20 0000 11/13/20 1325  AST 9* 10* 9*  ALT 6* 7 7  ALKPHOS 107 98 70  ALBUMIN 3.5 3.8 3.6   Recent Labs    03/12/20 0000  04/10/20 0000 11/13/20 1325  WBC 7.2 7.9 8.2  NEUTROABS 3,902 4,487  --   HGB 11.0* 11.2* 11.0*  HCT 34* 34* 34*  PLT 234 238 288   Lab Results  Component Value Date   TSH 2.32 11/13/2020   Lab Results  Component Value Date   HGBA1C 5.3 11/01/2016   No results found for: CHOL, HDL, LDLCALC, LDLDIRECT, TRIG, CHOLHDL  Significant Diagnostic Results in last 30 days:  No results found.  Assessment/Plan: Pressure ulcer of both heels, unstageable ([place]) reported heel wound concerns, seropurulent exudate with fould odor, no redness or warmth. Will apply Bactroban oint onto the wound beds, then cover with  Hydrocolloid dressing daily until healed.   Dependent edema Edema, dependent, persists,  increased Furosemide to '40mg'$  qd x2 weeks  since 02/23/21, will add '20mg'$  q 2pm and continue Furosemide '40mg'$  qd. Update BMP 03/11/21  COVID-19 virus infection Tested positive COVID. Supportive care, under Hospice service.   Slow transit constipation takes MiraLax.  Rectocele avoid constipation  Mixed Alzheimer's and vascular dementia ([place]) impulsive, no safety awareness, off  Memantine, resides in SNF [facility]  Anxiety and depression  takes Risperdal,  Depakote, Duloxetine, Clonazepam prn,  TSH 2.32 11/13/20  Essential hypertension Blood pressure is controlled.  takes Furosemide, ASA  Bun/creat 20/1.0 11/13/20   Hypothyroidism takes Levothyroxine. TSH 2.32 11/13/20  Generalized osteoarthritis of multiple sites Multiple sites, Tylenol bid,  Tramadol bid/prn available to her.     Family/ staff Communication: plan of care reviewed with the patient and charge nurse.   Labs/tests ordered:  none  Time spend 25 minutes.

## 2021-03-09 ENCOUNTER — Encounter: Payer: Self-pay | Admitting: Nurse Practitioner

## 2021-03-16 LAB — BASIC METABOLIC PANEL
BUN: 16 (ref 4–21)
CO2: 27 — AB (ref 13–22)
Chloride: 106 (ref 99–108)
Creatinine: 0.9 (ref 0.5–1.1)
Glucose: 76
Potassium: 4.1 (ref 3.4–5.3)
Sodium: 140 (ref 137–147)

## 2021-03-16 LAB — COMPREHENSIVE METABOLIC PANEL: Calcium: 9.6 (ref 8.7–10.7)

## 2021-03-18 ENCOUNTER — Encounter: Payer: Self-pay | Admitting: Nurse Practitioner

## 2021-03-18 ENCOUNTER — Non-Acute Institutional Stay (SKILLED_NURSING_FACILITY): Payer: Medicare Other | Admitting: Nurse Practitioner

## 2021-03-18 DIAGNOSIS — R609 Edema, unspecified: Secondary | ICD-10-CM

## 2021-03-18 DIAGNOSIS — F339 Major depressive disorder, recurrent, unspecified: Secondary | ICD-10-CM

## 2021-03-18 DIAGNOSIS — N816 Rectocele: Secondary | ICD-10-CM

## 2021-03-18 DIAGNOSIS — M159 Polyosteoarthritis, unspecified: Secondary | ICD-10-CM

## 2021-03-18 DIAGNOSIS — I10 Essential (primary) hypertension: Secondary | ICD-10-CM | POA: Diagnosis not present

## 2021-03-18 DIAGNOSIS — E039 Hypothyroidism, unspecified: Secondary | ICD-10-CM

## 2021-03-18 DIAGNOSIS — G309 Alzheimer's disease, unspecified: Secondary | ICD-10-CM

## 2021-03-18 DIAGNOSIS — L89620 Pressure ulcer of left heel, unstageable: Secondary | ICD-10-CM

## 2021-03-18 DIAGNOSIS — F0150 Vascular dementia without behavioral disturbance: Secondary | ICD-10-CM

## 2021-03-18 DIAGNOSIS — F0280 Dementia in other diseases classified elsewhere without behavioral disturbance: Secondary | ICD-10-CM

## 2021-03-18 DIAGNOSIS — K5901 Slow transit constipation: Secondary | ICD-10-CM

## 2021-03-18 DIAGNOSIS — L89610 Pressure ulcer of right heel, unstageable: Secondary | ICD-10-CM

## 2021-03-18 NOTE — Progress Notes (Signed)
Location:   [place] Room Number: [id number] Place of Service:  SNF (31) Provider:  Jatavia Keltner Otho Darner, NP  [name], MD  Patient Care Team: [name], MD as PCP - General (Internal Medicine) Lucrezia Dehne X, NP as Nurse Practitioner (Internal Medicine) [name], MD (Internal Medicine)  Extended Emergency Contact Information Primary Emergency Contact: [facility] Address: [address]          [place], [place] [address] [name] of [place] Phone: [phone] Relation: Son Secondary Emergency Contact: [facility] Address: [address] [place] [address] [name] Phone: [phone] Relation: Daughter  Code Status:  DNR Goals of care: Advanced Directive information Advanced Directives 03/18/2021  Does Patient Have a Medical Advance Directive? Yes  Type of Paramedic of [place];Living will;Out of facility DNR (pink MOST or yellow form)  Does patient want to make changes to medical advance directive? No - Patient declined  Copy of [place] in Chart? Yes - validated most recent copy scanned in chart (See row information)  Would patient like information on creating a medical advance directive? -  Pre-existing out of facility DNR order (yellow form or pink MOST form) Yellow form placed in chart (order not valid for inpatient use);Pink MOST form placed in chart (order not valid for inpatient use)     Chief Complaint  Patient presents with   Medical Management of Chronic Issues    Routine follow up    Health Maintenance    Discuss need for shingles vaccine    HPI:  Pt is a 87 y.o. female seen today for medical management of chronic diseases.      Tested positive COVID. Supportive care, under Hospice service.              Pressure ulcer R+L heel, healing .              Edema, dependent, better, on Furosemide bid             Constipation, takes MiraLax.              Rectocele avoid constipation             Dementia, impulsive, no safety awareness, off  Memantine, resides in SNF [facility]             Depression/anxiety: takes Risperdal,  Depakote, Duloxetine, Clonazepam prn,  TSH 2.32 11/13/20             HTN, takes Furosemide, ASA  Bun/creat 16/0.9 03/16/21             Hypothyroidism, takes Levothyroxine. TSH 2.32 11/13/20             OA,  Multiple sites, Tylenol bid,  Tramadol bid/prn available to her.      Past Medical History:  Diagnosis Date   Alzheimer disease ([place]) 10/27/2016   Arthritis    Confusion 04/04/2019   Depression, recurrent ([place]) 10/27/2016   History of fracture of right hip 04/04/2019   07/01/19 Ortho, R hip incision healed, X-ray looks good, s/p IMN R hip fx, WBAT, f/u 6 mos.    HTN (hypertension) 10/27/2016   Hypertension    Hypertensive kidney disease with CKD (chronic kidney disease) stage V ([place]) 11/02/2016   11/02/16 Na 136, K 4.7, Bun 30, creat 1.52, TSH 0.86, wbc 6.7, Hgb 11.3, plt 264   Hypothyroidism 10/27/2016  Osteoarthritis 10/27/2016   Thyroid disease    Past Surgical History:  Procedure Laterality Date   ABDOMINAL HYSTERECTOMY  1986   INTRAMEDULLARY (IM) NAIL INTERTROCHANTERIC Right 04/04/2019   Procedure: INTRAMEDULLARY (IM) NAIL INTERTROCHANTRIC;  Surgeon: Rod Can, MD;  [facility];  Service: Orthopedics;  Laterality: Right;   KNEE ARTHROSCOPY  2015/2017   Dr. Gustavus Bryant   SPINE SURGERY  2009, 2011   Dr.Mark Nicoletta Dress   TIBIA IM NAIL INSERTION Right 01/01/2020   Procedure: INTRAMEDULLARY (IM) NAIL TIBIAL;  Surgeon: Shona Needles, MD;  Location: [place];  Service: Orthopedics;  Laterality: Right;    No Known Allergies  Allergies as of 03/18/2021   No Known Allergies      Medication List        Accurate as of March 18, 2021 11:59 PM. If you have any questions, ask your nurse or doctor.          acetaminophen 325 MG tablet Commonly known as: TYLENOL Take 650 mg by mouth 2 (two) times daily.    aspirin 81 MG chewable tablet Chew 81 mg by mouth daily.   clonazePAM 0.5 MG tablet Commonly known as: KLONOPIN Take 0.5 mg by mouth every 6 (six) hours as needed for anxiety.   divalproex 125 MG DR tablet Commonly known as: DEPAKOTE Take 250 mg by mouth at bedtime.   DULoxetine 30 MG capsule Commonly known as: CYMBALTA Take 30 mg by mouth daily.   feeding supplement (PRO-STAT SUGAR FREE 64) Liqd Take 30 mLs by mouth daily. Mix with 4oz of juice   furosemide 20 MG tablet Commonly known as: LASIX Take 20 mg by mouth daily.   furosemide 40 MG tablet Commonly known as: LASIX Take 40 mg by mouth daily. 40 mg daily x 2 weeks, then return to 20 mg daily.   levothyroxine 25 MCG tablet Commonly known as: SYNTHROID Take 25 mcg by mouth daily before breakfast.   mupirocin ointment 2 % Commonly known as: BACTROBAN Apply 1 application topically daily.   ondansetron 4 MG/5ML solution Commonly known as: ZOFRAN Take 4 mg by mouth daily as needed for nausea or vomiting.   polyethylene glycol 17 g packet Commonly known as: MIRALAX / GLYCOLAX Take 17 g by mouth daily.   potassium chloride 10 MEQ CR capsule Commonly known as: MICRO-K Take 10 mEq by mouth daily.   risperiDONE 1 MG tablet Commonly known as: RISPERDAL Take 1 mg by mouth at bedtime.   Therems-M Tabs Take 1 tablet by mouth daily at 6 (six) AM.   traMADol 50 MG tablet Commonly known as: ULTRAM Take 0.5 tablets (25 mg total) by mouth every 6 (six) hours as needed.   traMADol 50 MG tablet Commonly known as: ULTRAM Take 25 mg by mouth 2 (two) times daily.        Review of Systems  Constitutional:  Negative for fatigue, fever and unexpected weight change.  HENT:  Positive for hearing loss. Negative for congestion, rhinorrhea and voice change.   Eyes:  Negative for visual disturbance.  Respiratory:  Negative for shortness of breath.   Cardiovascular:  Positive for leg swelling.  Gastrointestinal:  Negative  for abdominal pain and constipation.       Hx of rectocele.   Genitourinary:  Negative for dysuria, frequency and urgency.  Musculoskeletal:  Positive for arthralgias and gait problem.       S/p R lower leg IM tibia  Skin:  Positive for wound. Negative for color change.  Neurological:  Negative for speech difficulty, weakness and light-headedness.       Dementia, lethargic.   Psychiatric/Behavioral:  Positive for agitation, behavioral problems, confusion and sleep disturbance. The patient is nervous/anxious.        Improving.    Immunization History  Administered Date(s) Administered   Influenza-Unspecified 05/29/2017, 05/20/2020   Moderna SARS-COV2 Booster Vaccination 01/05/2021   Moderna Sars-Covid-2 Vaccination 09/07/2019, 10/05/2019, 06/16/2020   Pneumococcal Conjugate-13 06/21/2017   Pneumococcal Polysaccharide-23 07/16/2018   Tdap 06/21/2017   Pertinent  Health Maintenance Due  Topic Date Due   DEXA SCAN  Completed   PNA vac Low Risk Adult  Completed   INFLUENZA VACCINE  Discontinued   Fall Risk  02/01/2021 04/27/2018 04/25/2017  Falls in the past year? 1 No No  Number falls in past yr: 0 - -  Injury with Fall? 1 - -  Risk for fall due to : History of fall(s) - -  Follow up Falls evaluation completed - -   Functional Status Survey:    Vitals:   03/18/21 1101  BP: (!) 126/94  Pulse: 100  Resp: 18  Temp: 97.9 F (36.6 C)  SpO2: 96%  Weight: 145 lb 9.6 oz (66 kg)  Height: '4\' 11"'$  (1.499 m)   Body mass index is 29.41 kg/m. Physical Exam Vitals and nursing note reviewed.  Constitutional:      Appearance: Normal appearance.  HENT:     Head: Normocephalic and atraumatic.     Nose: Nose normal.     Mouth/Throat:     Mouth: Mucous membranes are moist.  Eyes:     Extraocular Movements: Extraocular movements intact.     Conjunctiva/sclera: Conjunctivae normal.     Pupils: Pupils are equal, round, and reactive to light.  Cardiovascular:     Rate and Rhythm: Normal  rate and regular rhythm.     Heart sounds: No murmur heard.    Comments: DP pulses present R+L Pulmonary:     Effort: Pulmonary effort is normal.     Breath sounds: No rales.  Abdominal:     General: Bowel sounds are normal.     Palpations: Abdomen is soft.     Tenderness: There is no abdominal tenderness.  Musculoskeletal:     Right lower leg: Edema present.     Left lower leg: Edema present.     Comments: R s/p IM rod for tibia fx. Edema 1+ BLE or dependent   Skin:    General: Skin is warm and dry.     Comments: Unstageable  R+L heel, healing nicely, smaller in sizes, less depth, slightly yellowish slough  Neurological:     General: No focal deficit present.     Mental Status: She is alert.     Gait: Gait abnormal.  Psychiatric:        Mood and Affect: Mood normal.     Comments: Followed simple directions during today's examination, but confused.     Labs reviewed: Recent Labs    06/03/20 0000 11/13/20 1325 03/16/21 0000  NA 139 139 140  K 4.9 4.5 4.1  CL 102 103 106  CO2 28* 27* 27*  BUN 25* 20 16  CREATININE 1.3* 1.0 0.9  CALCIUM 10.5 10.0 9.6   Recent Labs    04/10/20 0000 11/13/20 1325  AST 10* 9*  ALT 7 7  ALKPHOS 98 70  ALBUMIN 3.8 3.6   Recent Labs    04/10/20 0000 11/13/20 1325  WBC 7.9 8.2  NEUTROABS 4,487  --  HGB 11.2* 11.0*  HCT 34* 34*  PLT 238 288   Lab Results  Component Value Date   TSH 2.32 11/13/2020   Lab Results  Component Value Date   HGBA1C 5.3 11/01/2016   No results found for: CHOL, HDL, LDLCALC, LDLDIRECT, TRIG, CHOLHDL  Significant Diagnostic Results in last 30 days:  No results found.  Assessment/Plan Essential hypertension Isolated elevated DBP in 90s, asymptomatic, takes Furosemide, ASA  Bun/creat 16/0.9 03/16/21. Observe.   Hypothyroidism takes Levothyroxine. TSH 2.32 11/13/20  Generalized osteoarthritis of multiple sites Multiple sites, Tylenol bid,  Tramadol bid/prn available to her.   Depression,  recurrent ([place]) Her  Mood/bahaviors are stabilizing, takes Risperdal,  Depakote, Duloxetine, Clonazepam prn,  TSH 2.32 11/13/20  Mixed Alzheimer's and vascular dementia ([place]) impulsive, no safety awareness, off  Memantine, resides in SNF [facility]  Rectocele avoid constipation  Slow transit constipation Stable, takes MiraLax.  Dependent edema dependent, better, on Furosemide bid  Pressure ulcer of both heels, unstageable (HCC)   Pressure ulcer R+L heel, healing .     Family/ staff Communication: plan of care reviewed with the patient and charge nurse.   Labs/tests ordered:  none  Time spend 25 minutes.

## 2021-03-19 ENCOUNTER — Encounter: Payer: Self-pay | Admitting: Nurse Practitioner

## 2021-03-19 NOTE — Assessment & Plan Note (Signed)
Her  Mood/bahaviors are stabilizing, takes Risperdal,  Depakote, Duloxetine, Clonazepam prn,  TSH 2.32 11/13/20

## 2021-03-19 NOTE — Assessment & Plan Note (Signed)
dependent, better, on Furosemide bid

## 2021-03-19 NOTE — Assessment & Plan Note (Signed)
Isolated elevated DBP in 90s, asymptomatic, takes Furosemide, ASA  Bun/creat 16/0.9 03/16/21. Observe.

## 2021-03-19 NOTE — Assessment & Plan Note (Signed)
Multiple sites, Tylenol bid,  Tramadol bid/prn available to her.

## 2021-03-19 NOTE — Assessment & Plan Note (Signed)
Pressure ulcer R+L heel, healing .

## 2021-03-19 NOTE — Assessment & Plan Note (Signed)
avoid constipation 

## 2021-03-19 NOTE — Assessment & Plan Note (Signed)
takes Levothyroxine. TSH 2.32 11/13/20

## 2021-03-19 NOTE — Assessment & Plan Note (Signed)
Stable, takes MiraLax  

## 2021-03-19 NOTE — Assessment & Plan Note (Signed)
impulsive, no safety awareness, off  Memantine, resides in SNF [facility]

## 2021-03-23 ENCOUNTER — Encounter: Payer: Self-pay | Admitting: Nurse Practitioner

## 2021-04-22 ENCOUNTER — Non-Acute Institutional Stay (SKILLED_NURSING_FACILITY): Payer: Medicare Other | Admitting: Orthopedic Surgery

## 2021-04-22 ENCOUNTER — Encounter: Payer: Self-pay | Admitting: Orthopedic Surgery

## 2021-04-22 DIAGNOSIS — B372 Candidiasis of skin and nail: Secondary | ICD-10-CM | POA: Diagnosis not present

## 2021-04-22 DIAGNOSIS — F0150 Vascular dementia without behavioral disturbance: Secondary | ICD-10-CM

## 2021-04-22 DIAGNOSIS — G309 Alzheimer's disease, unspecified: Secondary | ICD-10-CM

## 2021-04-22 DIAGNOSIS — R600 Localized edema: Secondary | ICD-10-CM

## 2021-04-22 DIAGNOSIS — F419 Anxiety disorder, unspecified: Secondary | ICD-10-CM

## 2021-04-22 DIAGNOSIS — I10 Essential (primary) hypertension: Secondary | ICD-10-CM | POA: Diagnosis not present

## 2021-04-22 DIAGNOSIS — E039 Hypothyroidism, unspecified: Secondary | ICD-10-CM

## 2021-04-22 DIAGNOSIS — M159 Polyosteoarthritis, unspecified: Secondary | ICD-10-CM

## 2021-04-22 DIAGNOSIS — L89610 Pressure ulcer of right heel, unstageable: Secondary | ICD-10-CM | POA: Diagnosis not present

## 2021-04-22 DIAGNOSIS — F0280 Dementia in other diseases classified elsewhere without behavioral disturbance: Secondary | ICD-10-CM

## 2021-04-22 DIAGNOSIS — F32A Depression, unspecified: Secondary | ICD-10-CM

## 2021-04-22 DIAGNOSIS — N816 Rectocele: Secondary | ICD-10-CM

## 2021-04-22 DIAGNOSIS — L89620 Pressure ulcer of left heel, unstageable: Secondary | ICD-10-CM

## 2021-04-22 DIAGNOSIS — K5901 Slow transit constipation: Secondary | ICD-10-CM

## 2021-04-22 MED ORDER — NYSTATIN 100000 UNIT/GM EX POWD: 1.0000 "application " | Freq: Two times a day (BID) | CUTANEOUS | 0 refills | Status: AC

## 2021-04-22 NOTE — Progress Notes (Signed)
Location:  [place] Room Number: [place] of Service:  SNF [phone]) Provider:  Windell Moulding, AGNP-C  [name], MD  Patient Care Team: [name], MD as PCP - General (Internal Medicine) Mast, Man X, NP as Nurse Practitioner (Internal Medicine) [name], MD (Internal Medicine)  Extended Emergency Contact Information Primary Emergency Contact: [facility] Address: [address]          [place], [place] [address] [name] of [place] Phone: [phone] Relation: Son Secondary Emergency Contact: [facility] Address: [address] [place] [address] [name] Phone: [phone] Relation: Daughter  Code Status:  DNR Goals of care: Advanced Directive information Advanced Directives 03/18/2021  Does Patient Have a Medical Advance Directive? Yes  Type of Paramedic of [place];Living will;Out of facility DNR (pink MOST or yellow form)  Does patient want to make changes to medical advance directive? No - Patient declined  Copy of [place] in Chart? Yes - validated most recent copy scanned in chart (See row information)  Would patient like information on creating a medical advance directive? -  Pre-existing out of facility DNR order (yellow form or pink MOST form) Yellow form placed in chart (order not valid for inpatient use);Pink MOST form placed in chart (order not valid for inpatient use)     Chief Complaint  Patient presents with   Medical Management of Chronic Issues    HPI:  Pt is a 87 y.o. female seen today for medical management of chronic diseases.    She currently resides on the skilled unit at [facility]. Past medical history includes: HTN, Alzheimer's, rectocele, constipation, hypothyroidism, CKD 3a, anemia, anxiety and depression.   Dementia- followed by hospice, no recent behavioral outbursts, alert to self and familiar faces, very  pleasant, follows commands, continues to have poor safety awareness Itching skin-nurse reports excoriated skin along groin skin folds, she c/o itching and burning this morning Pressure ulcer heels- followed by wound care nurse, sores continue to heel with elevation in bed HTN- BUN/creat 16/0.9 03/16/2021, remains on furosemide and ASA BLE - +1 pitting edema to legs, furosemide daily Hypothyroidism- TSH 2.32 11/2020, levothyroxine daily Rectocele/constipation- goal to avoid constipation to manage rectocele, LBM 09/14, miralax daily OA multiple joints- given tramadol and tylenol daily for joint pain, ambulates in wheelchair, describes joints as "stiff" today Depression/anxiety- remains on Cymbalta, klonopin, Depakote and Risperdal regimen, no recent panic attacks  No recent falls or injuries. Ambulates with wheelchair, often observed in television room.   Recent blood pressures:  09/14- 128/58  09/07- 135/62  09/05- 146/82  Recent weights:  09/02- 142.6 lbs   08/02- 145.6 lbs  07/07- 141 lbs  Nurse does nor report any other concerns, vitals stable.        Past Medical History:  Diagnosis Date   Alzheimer disease ([place]) 10/27/2016   Arthritis    Confusion 04/04/2019   Depression, recurrent ([place]) 10/27/2016   History of fracture of right hip 04/04/2019   07/01/19 Ortho, R hip incision healed, X-ray looks good, s/p IMN R hip fx, WBAT, f/u 6 mos.    HTN (hypertension) 10/27/2016   Hypertension    Hypertensive kidney disease with CKD (chronic kidney disease) stage V ([place]) 11/02/2016   11/02/16 Na 136, K 4.7, Bun 30, creat 1.52, TSH 0.86, wbc 6.7, Hgb 11.3, plt 264   Hypothyroidism 10/27/2016   Osteoarthritis  10/27/2016   Thyroid disease    Past Surgical History:  Procedure Laterality Date   ABDOMINAL HYSTERECTOMY  1986   INTRAMEDULLARY (IM) NAIL INTERTROCHANTERIC Right 04/04/2019   Procedure: INTRAMEDULLARY (IM) NAIL INTERTROCHANTRIC;  Surgeon: Rod Can, MD;  Location: [facility];  Service: Orthopedics;  Laterality: Right;   KNEE ARTHROSCOPY  2015/2017   Dr. Gustavus Bryant   SPINE SURGERY  2009, 2011   Dr.Mark Nicoletta Dress   TIBIA IM NAIL INSERTION Right 01/01/2020   Procedure: INTRAMEDULLARY (IM) NAIL TIBIAL;  Surgeon: Shona Needles, MD;  Location: [place];  Service: Orthopedics;  Laterality: Right;    No Known Allergies  Outpatient Encounter Medications as of 04/22/2021  Medication Sig   acetaminophen (TYLENOL) 325 MG tablet Take 650 mg by mouth 2 (two) times daily.   Amino Acids-Protein Hydrolys (FEEDING SUPPLEMENT, PRO-STAT SUGAR FREE 64,) LIQD Take 30 mLs by mouth daily. Mix with 4oz of juice   aspirin 81 MG chewable tablet Chew 81 mg by mouth daily.   clonazePAM (KLONOPIN) 0.5 MG tablet Take 0.5 mg by mouth every 6 (six) hours as needed for anxiety.   divalproex (DEPAKOTE) 125 MG DR tablet Take 250 mg by mouth at bedtime.   DULoxetine (CYMBALTA) 30 MG capsule Take 30 mg by mouth daily.   furosemide (LASIX) 20 MG tablet Take 20 mg by mouth daily.   furosemide (LASIX) 40 MG tablet Take 40 mg by mouth daily. 40 mg daily x 2 weeks, then return to 20 mg daily.   levothyroxine (SYNTHROID, LEVOTHROID) 25 MCG tablet Take 25 mcg by mouth daily before breakfast.   Multiple Vitamins-Minerals (THEREMS-M) TABS Take 1 tablet by mouth daily at 6 (six) AM.   mupirocin ointment (BACTROBAN) 2 % Apply 1 application topically daily.   ondansetron (ZOFRAN) 4 MG/5ML solution Take 4 mg by mouth daily as needed for nausea or vomiting.   polyethylene glycol (MIRALAX / GLYCOLAX) 17 g packet Take 17 g by mouth daily.   potassium chloride (MICRO-K) 10 MEQ CR capsule Take 10 mEq by mouth daily.   risperiDONE (RISPERDAL) 1 MG tablet Take 1 mg by mouth at bedtime.   traMADol (ULTRAM) 50 MG tablet Take 0.5 tablets (25 mg total) by mouth every 6 (six) hours as needed.   traMADol (ULTRAM) 50 MG tablet Take 25 mg by mouth 2 (two) times daily.   No facility-administered encounter medications on file as of  04/22/2021.    Review of Systems  Unable to perform ROS: Dementia   Immunization History  Administered Date(s) Administered   Influenza-Unspecified 05/29/2017, 05/20/2020   Moderna SARS-COV2 Booster Vaccination 01/05/2021   Moderna Sars-Covid-2 Vaccination 09/07/2019, 10/05/2019, 06/16/2020   Pneumococcal Conjugate-13 06/21/2017   Pneumococcal Polysaccharide-23 07/16/2018   Tdap 06/21/2017   Pertinent  Health Maintenance Due  Topic Date Due   DEXA SCAN  Completed   PNA vac Low Risk Adult  Completed   INFLUENZA VACCINE  Discontinued   Fall Risk  02/01/2021 04/27/2018 04/25/2017  Falls in the past year? 1 No No  Number falls in past yr: 0 - -  Injury with Fall? 1 - -  Risk for fall due to : History of fall(s) - -  Follow up Falls evaluation completed - -   Functional Status Survey:    Vitals:   04/22/21 1604  BP: (!) 128/58  Pulse: 60  Resp: 18  Temp: (!) 96.8 F (36 C)  SpO2: 96%  Weight: 142 lb 9.6 oz (64.7 kg)  Height: '4\' 11"'$  (1.499  m)   Body mass index is 28.8 kg/m. Physical Exam Vitals reviewed.  Constitutional:      General: She is not in acute distress. HENT:     Head: Normocephalic.     Right Ear: There is no impacted cerumen.     Left Ear: There is no impacted cerumen.     Nose: Nose normal.     Mouth/Throat:     Mouth: Mucous membranes are moist.  Eyes:     General:        Right eye: No discharge.        Left eye: No discharge.  Neck:     Vascular: No carotid bruit.  Cardiovascular:     Rate and Rhythm: Normal rate and regular rhythm.     Pulses: Normal pulses.     Heart sounds: Normal heart sounds. No murmur heard. Pulmonary:     Effort: Pulmonary effort is normal. No respiratory distress.     Breath sounds: Normal breath sounds.  Abdominal:     General: Bowel sounds are normal. There is no distension.     Palpations: Abdomen is soft.     Tenderness: There is no abdominal tenderness.  Musculoskeletal:     Cervical back: Normal range of  motion.     Right lower leg: Edema present.     Left lower leg: Edema present.     Comments: +1 pitting edema to BLE  Lymphadenopathy:     Cervical: No cervical adenopathy.  Skin:    General: Skin is warm and dry.     Findings: Rash present.     Comments: Skin folds near groin excoriated, no skin breakdown observed. Bilateral heels CDI, scab present, no drainage, surrounding skin intact.   Neurological:     General: No focal deficit present.     Mental Status: She is alert. Mental status is at baseline.     Motor: Weakness present.     Gait: Gait abnormal.     Comments: wheelchair  Psychiatric:        Mood and Affect: Mood normal.        Behavior: Behavior normal.        Cognition and Memory: Memory is impaired.    Labs reviewed: Recent Labs    06/03/20 0000 11/13/20 1325 03/16/21 0000  NA 139 139 140  K 4.9 4.5 4.1  CL 102 103 106  CO2 28* 27* 27*  BUN 25* 20 16  CREATININE 1.3* 1.0 0.9  CALCIUM 10.5 10.0 9.6   Recent Labs    11/13/20 1325  AST 9*  ALT 7  ALKPHOS 70  ALBUMIN 3.6   Recent Labs    11/13/20 1325  WBC 8.2  HGB 11.0*  HCT 34*  PLT 288   Lab Results  Component Value Date   TSH 2.32 11/13/2020   Lab Results  Component Value Date   HGBA1C 5.3 11/01/2016   No results found for: CHOL, HDL, LDLCALC, LDLDIRECT, TRIG, CHOLHDL  Significant Diagnostic Results in last 30 days:  No results found.  Assessment/Plan 1. Candidal skin infection - skin fold excoriated, suspect due to moisture - start nystatin power bid x 14 days - nystatin (MYCOSTATIN/NYSTOP) powder; Apply 1 application topically 2 (two) times daily for 15 days.  Dispense: 30 g; Refill: 0  2. Mixed Alzheimer's and vascular dementia ([place]) - followed by hospice - no recent behavioral outbursts, very pleasant today - cont skilled nursing care  3. Pressure ulcer of both heels, unstageable ([place]) -  slow healing, no sign of infection - cont to elevate heels in bed  4. Essential  hypertension - controlled with furosemide daily - cont NAS diet  5. Bilateral leg edema - see above  6. Acquired hypothyroidism - TSH 2.32 11/2020 - cont levothyroxine  7. Rectocele - cont constipation prevention  8. Slow transit constipation - LBM 09/14, abdomen soft - cont miralax daily  9. Osteoarthritis of multiple joints, unspecified osteoarthritis type - stable with tramadol and tylenol daily  10. Anxiety and depression - no recent panic attacks or increased depression - cont Cymbalta, Risperdal, Depakote and clonazepam regimen    Family/ staff Communication: plan discussed with patient and nurse  Labs/tests ordered:  none

## 2021-04-23 MED ORDER — FUROSEMIDE 40 MG PO TABS: 40.0000 mg | ORAL_TABLET | Freq: Every day | ORAL | 3 refills | Status: DC

## 2021-05-20 ENCOUNTER — Encounter: Payer: Self-pay | Admitting: Nurse Practitioner

## 2021-05-20 ENCOUNTER — Non-Acute Institutional Stay (SKILLED_NURSING_FACILITY): Payer: Medicare Other | Admitting: Nurse Practitioner

## 2021-05-20 DIAGNOSIS — R609 Edema, unspecified: Secondary | ICD-10-CM

## 2021-05-20 DIAGNOSIS — F0150 Vascular dementia without behavioral disturbance: Secondary | ICD-10-CM

## 2021-05-20 DIAGNOSIS — F419 Anxiety disorder, unspecified: Secondary | ICD-10-CM | POA: Diagnosis not present

## 2021-05-20 DIAGNOSIS — E039 Hypothyroidism, unspecified: Secondary | ICD-10-CM

## 2021-05-20 DIAGNOSIS — I10 Essential (primary) hypertension: Secondary | ICD-10-CM

## 2021-05-20 DIAGNOSIS — N816 Rectocele: Secondary | ICD-10-CM

## 2021-05-20 DIAGNOSIS — M159 Polyosteoarthritis, unspecified: Secondary | ICD-10-CM

## 2021-05-20 DIAGNOSIS — F0280 Dementia in other diseases classified elsewhere without behavioral disturbance: Secondary | ICD-10-CM

## 2021-05-20 DIAGNOSIS — K5901 Slow transit constipation: Secondary | ICD-10-CM

## 2021-05-20 DIAGNOSIS — G309 Alzheimer's disease, unspecified: Secondary | ICD-10-CM

## 2021-05-20 DIAGNOSIS — F32A Depression, unspecified: Secondary | ICD-10-CM

## 2021-05-20 NOTE — Assessment & Plan Note (Signed)
Stable, takes MiraLax  

## 2021-05-20 NOTE — Progress Notes (Signed)
Location:   SNF [place] Room Number: 42A Place of Service:  SNF (31) Provider: [employer] Alysandra Lobue NP  [name], MD  Patient Care Team: [name], MD as PCP - General (Internal Medicine) Fatin Bachicha X, NP as Nurse Practitioner (Internal Medicine) [name], MD (Internal Medicine)  Extended Emergency Contact Information Primary Emergency Contact: [facility] Address: [address]          [place], [place] [address] [name] of [place] Phone: [phone] Relation: Son Secondary Emergency Contact: [facility] Address: [address] [place] [address] [name] Phone: [phone] Relation: Daughter  Code Status:  DNR Goals of care: Advanced Directive information Advanced Directives 03/18/2021  Does Patient Have a Medical Advance Directive? Yes  Type of Paramedic of [place];Living will;Out of facility DNR (pink MOST or yellow form)  Does patient want to make changes to medical advance directive? No - Patient declined  Copy of [place] in Chart? Yes - validated most recent copy scanned in chart (See row information)  Would patient like information on creating a medical advance directive? -  Pre-existing out of facility DNR order (yellow form or pink MOST form) Yellow form placed in chart (order not valid for inpatient use);Pink MOST form placed in chart (order not valid for inpatient use)     Chief Complaint  Patient presents with   Medical Management of Chronic Issues    HPI:  Pt is a 87 y.o. female seen today for medical management of chronic diseases.       Tested positive COVID. Supportive care, under Hospice service.  Pressure ulcers R+L heels.              Edema, dependent, better, on Furosemide              Constipation, takes MiraLax.             Rectocele avoid constipation             Dementia, impulsive, no safety awareness, off  Memantine, resides in SNF  [facility], under Hospice service.              Depression/anxiety: takes Risperdal,  Depakote, Duloxetine, Clonazepam prn,  TSH 2.32 11/13/20             HTN, takes Furosemide, ASA  Bun/creat 16/0.9 03/16/21             Hypothyroidism, takes Levothyroxine. TSH 2.32 11/13/20             OA,  Multiple sites, managed with Tylenol, Tramadol.   Past Medical History:  Diagnosis Date   Alzheimer disease ([place]) 10/27/2016   Arthritis    Confusion 04/04/2019   Depression, recurrent ([place]) 10/27/2016   History of fracture of right hip 04/04/2019   07/01/19 Ortho, R hip incision healed, X-ray looks good, s/p IMN R hip fx, WBAT, f/u 6 mos.    HTN (hypertension) 10/27/2016   Hypertension    Hypertensive kidney disease with CKD (chronic kidney disease) stage V ([place]) 11/02/2016   11/02/16 Na 136, K 4.7, Bun 30, creat 1.52, TSH 0.86, wbc 6.7, Hgb 11.3, plt 264   Hypothyroidism 10/27/2016   Osteoarthritis 10/27/2016   Thyroid disease    Past Surgical History:  Procedure Laterality Date   ABDOMINAL HYSTERECTOMY  1986   INTRAMEDULLARY (IM) NAIL INTERTROCHANTERIC Right 04/04/2019   Procedure: INTRAMEDULLARY (IM) NAIL INTERTROCHANTRIC;  Surgeon: Rod Can, MD;  [facility];  Service: Orthopedics;  Laterality: Right;   KNEE ARTHROSCOPY  2015/2017   Dr. Gustavus Bryant   SPINE SURGERY  2009, 2011   Dr.Mark Nicoletta Dress   TIBIA IM NAIL INSERTION Right 01/01/2020   Procedure: INTRAMEDULLARY (IM) NAIL TIBIAL;  Surgeon: Shona Needles, MD;  Location: [place];  Service: Orthopedics;  Laterality: Right;    No Known Allergies  Allergies as of 05/20/2021   No Known Allergies      Medication List        Accurate as of May 20, 2021 11:59 PM. If you have any questions, ask your nurse or doctor.          acetaminophen 325 MG tablet Commonly known as: TYLENOL Take 650 mg by mouth 2 (two) times daily.   aspirin 81 MG chewable tablet Chew 81 mg by mouth daily.   clonazePAM 0.5 MG tablet Commonly known as:  KLONOPIN Take 0.5 mg by mouth every 6 (six) hours as needed for anxiety.   divalproex 125 MG DR tablet Commonly known as: DEPAKOTE Take 250 mg by mouth at bedtime.   DULoxetine 30 MG capsule Commonly known as: CYMBALTA Take 30 mg by mouth daily.   feeding supplement (PRO-STAT SUGAR FREE 64) Liqd Take 30 mLs by mouth daily. Mix with 4oz of juice   furosemide 20 MG tablet Commonly known as: LASIX Take 20 mg by mouth daily.   furosemide 40 MG tablet Commonly known as: LASIX Take 1 tablet (40 mg total) by mouth daily for 22 days. 40 mg daily x 2 weeks, then return to 20 mg daily.   levothyroxine 25 MCG tablet Commonly known as: SYNTHROID Take 25 mcg by mouth daily before breakfast.   mupirocin ointment 2 % Commonly known as: BACTROBAN Apply 1 application topically daily.   ondansetron 4 MG/5ML solution Commonly known as: ZOFRAN Take 4 mg by mouth daily as needed for nausea or vomiting.   polyethylene glycol 17 g packet Commonly known as: MIRALAX / GLYCOLAX Take 17 g by mouth daily.   potassium chloride 10 MEQ CR capsule Commonly known as: MICRO-K Take 10 mEq by mouth daily.   risperiDONE 1 MG tablet Commonly known as: RISPERDAL Take 1 mg by mouth at bedtime.   [place]-M Tabs Take 1 tablet by mouth daily at 6 (six) AM.   traMADol 50 MG tablet Commonly known as: ULTRAM Take 0.5 tablets (25 mg total) by mouth every 6 (six) hours as needed.   traMADol 50 MG tablet Commonly known as: ULTRAM Take 25 mg by mouth 2 (two) times daily.        Review of Systems  Constitutional:  Negative for fatigue, fever and unexpected weight change.  HENT:  Positive for hearing loss. Negative for congestion, rhinorrhea and voice change.   Eyes:  Negative for visual disturbance.  Respiratory:  Negative for shortness of breath.   Cardiovascular:  Positive for leg swelling.  Gastrointestinal:  Negative for abdominal pain and constipation.       Hx of rectocele.   Genitourinary:   Negative for dysuria, frequency and urgency.  Musculoskeletal:  Positive for arthralgias and gait problem.       S/p R lower leg IM tibia  Skin:  Positive for wound. Negative for color change.  Neurological:  Negative for speech difficulty, weakness and light-headedness.       Dementia, lethargic.   Psychiatric/Behavioral:  Positive for agitation, behavioral problems, confusion and sleep disturbance. The patient is  nervous/anxious.        Improving.    Immunization History  Administered Date(s) Administered   Influenza-Unspecified 05/29/2017, 05/20/2020   Moderna SARS-COV2 Booster Vaccination 01/05/2021   Moderna Sars-Covid-2 Vaccination 09/07/2019, 10/05/2019, 06/16/2020   Pneumococcal Conjugate-13 06/21/2017   Pneumococcal Polysaccharide-23 07/16/2018   Tdap 06/21/2017   Pertinent  Health Maintenance Due  Topic Date Due   DEXA SCAN  Completed   INFLUENZA VACCINE  Discontinued   Fall Risk  02/01/2021 04/27/2018 04/25/2017  Falls in the past year? 1 No No  Number falls in past yr: 0 - -  Injury with Fall? 1 - -  Risk for fall due to : History of fall(s) - -  Follow up Falls evaluation completed - -   Functional Status Survey:    Vitals:   05/20/21 1228  BP: 134/79  Pulse: 80  Resp: 16  Temp: (!) 96.9 F (36.1 C)  SpO2: 96%   There is no height or weight on file to calculate BMI. Physical Exam Vitals and nursing note reviewed.  Constitutional:      Appearance: Normal appearance.  HENT:     Head: Normocephalic and atraumatic.     Nose: Nose normal.     Mouth/Throat:     Mouth: Mucous membranes are moist.  Eyes:     Extraocular Movements: Extraocular movements intact.     Conjunctiva/sclera: Conjunctivae normal.     Pupils: Pupils are equal, round, and reactive to light.  Cardiovascular:     Rate and Rhythm: Normal rate and regular rhythm.     Heart sounds: No murmur heard.    Comments: DP pulses present R+L Pulmonary:     Effort: Pulmonary effort is normal.      Breath sounds: No rales.  Abdominal:     General: Bowel sounds are normal.     Palpations: Abdomen is soft.     Tenderness: There is no abdominal tenderness.  Musculoskeletal:     Right lower leg: Edema present.     Left lower leg: Edema present.     Comments: R s/p IM rod for tibia fx. Edema trace BLE, dependent   Skin:    General: Skin is warm and dry.     Comments: R+L heel scabbed over area from healed previous pressure ulcers.   Neurological:     General: No focal deficit present.     Mental Status: She is alert.     Gait: Gait abnormal.  Psychiatric:        Mood and Affect: Mood normal.     Comments: Followed simple directions during today's examination, but confused.     Labs reviewed: Recent Labs    06/03/20 0000 11/13/20 1325 03/16/21 0000  NA 139 139 140  K 4.9 4.5 4.1  CL 102 103 106  CO2 28* 27* 27*  BUN 25* 20 16  CREATININE 1.3* 1.0 0.9  CALCIUM 10.5 10.0 9.6   Recent Labs    11/13/20 1325  AST 9*  ALT 7  ALKPHOS 70  ALBUMIN 3.6   Recent Labs    11/13/20 1325  WBC 8.2  HGB 11.0*  HCT 34*  PLT 288   Lab Results  Component Value Date   TSH 2.32 11/13/2020   Lab Results  Component Value Date   HGBA1C 5.3 11/01/2016   No results found for: CHOL, HDL, LDLCALC, LDLDIRECT, TRIG, CHOLHDL  Significant Diagnostic Results in last 30 days:  No results found.  Assessment/Plan  Essential hypertension Blood pressure  is controlled, takes Furosemide, ASA  Bun/creat 16/0.9 03/16/21  Hypothyroidism  takes Levothyroxine. TSH 2.32 11/13/20  Generalized osteoarthritis of multiple sites Multiple sites, managed with Tylenol, Tramadol.   Anxiety and depression Her mood is managed, takes Risperdal,  Depakote, Duloxetine, Clonazepam prn,  TSH 2.32 11/13/20  Mixed Alzheimer's and vascular dementia ([place])  impulsive, no safety awareness, off  Memantine, resides in SNF [facility], under Hospice service.   Rectocele avoid constipation  Slow transit  constipation Stable,  takes MiraLax.  Dependent edema dependent, better, on Furosemide    Family/ staff Communication: plan of care reviewed with the patient and charge nurse.   Labs/tests ordered: none  Time spend 25 minutes.

## 2021-05-20 NOTE — Assessment & Plan Note (Signed)
dependent, better, on Furosemide

## 2021-05-20 NOTE — Assessment & Plan Note (Signed)
Blood pressure is controlled, takes Furosemide, ASA  Bun/creat 16/0.9 03/16/21

## 2021-05-20 NOTE — Assessment & Plan Note (Signed)
avoid constipation 

## 2021-05-20 NOTE — Assessment & Plan Note (Signed)
Her mood is managed, takes Risperdal,  Depakote, Duloxetine, Clonazepam prn,  TSH 2.32 11/13/20

## 2021-05-20 NOTE — Assessment & Plan Note (Signed)
takes Levothyroxine. TSH 2.32 11/13/20

## 2021-05-20 NOTE — Assessment & Plan Note (Signed)
impulsive, no safety awareness, off  Memantine, resides in SNF [facility], under Hospice service.

## 2021-05-20 NOTE — Assessment & Plan Note (Signed)
Multiple sites, managed with Tylenol, Tramadol.  

## 2021-05-21 ENCOUNTER — Encounter: Payer: Self-pay | Admitting: Nurse Practitioner

## 2021-05-26 ENCOUNTER — Encounter: Payer: Self-pay | Admitting: Orthopedic Surgery

## 2021-05-26 ENCOUNTER — Non-Acute Institutional Stay (SKILLED_NURSING_FACILITY): Payer: Medicare Other | Admitting: Orthopedic Surgery

## 2021-05-26 DIAGNOSIS — R609 Edema, unspecified: Secondary | ICD-10-CM | POA: Diagnosis not present

## 2021-05-26 DIAGNOSIS — I10 Essential (primary) hypertension: Secondary | ICD-10-CM | POA: Diagnosis not present

## 2021-05-26 DIAGNOSIS — S0003XA Contusion of scalp, initial encounter: Secondary | ICD-10-CM | POA: Diagnosis not present

## 2021-05-26 DIAGNOSIS — E039 Hypothyroidism, unspecified: Secondary | ICD-10-CM

## 2021-05-26 DIAGNOSIS — F32A Depression, unspecified: Secondary | ICD-10-CM

## 2021-05-26 DIAGNOSIS — G309 Alzheimer's disease, unspecified: Secondary | ICD-10-CM

## 2021-05-26 DIAGNOSIS — F0280 Dementia in other diseases classified elsewhere without behavioral disturbance: Secondary | ICD-10-CM

## 2021-05-26 DIAGNOSIS — M159 Polyosteoarthritis, unspecified: Secondary | ICD-10-CM

## 2021-05-26 DIAGNOSIS — F419 Anxiety disorder, unspecified: Secondary | ICD-10-CM

## 2021-05-26 DIAGNOSIS — F0150 Vascular dementia without behavioral disturbance: Secondary | ICD-10-CM

## 2021-05-26 NOTE — Progress Notes (Signed)
Location:   [place] Room Number: 42-A Place of Service:  SNF (31) Provider:  Windell Moulding, NP    Patient Care Team: [name], MD as PCP - General (Internal Medicine) Mast, Man X, NP as Nurse Practitioner (Internal Medicine) [name], MD (Internal Medicine)  Extended Emergency Contact Information Primary Emergency Contact: [facility] Address: [address]          [place], [place] [address] [name] of [place] Phone: [phone] Relation: Son Secondary Emergency Contact: [facility] Address: [address] [place] [address] [name] Phone: [phone] Relation: Daughter  Code Status:  DNR Goals of care: Advanced Directive information Advanced Directives 05/26/2021  Does Patient Have a Medical Advance Directive? Yes  Type of Advance Directive [place]  Does patient want to make changes to medical advance directive? No - Patient declined  Copy of [place] in Chart? Yes - validated most recent copy scanned in chart (See row information)  Would patient like information on creating a medical advance directive? -  Pre-existing out of facility DNR order (yellow form or pink MOST form) -     Chief Complaint  Patient presents with   Acute Visit    Left side head injury.    HPI:  Pt is a 87 y.o. female seen today for an acute visit for fall with head injury.   She currently resides on the skilled unit at [facility]. Past medical history includes: HTN, Alzheimer's, rectocele, constipation, hypothyroidism, CKD 3a, anemia, anxiety and depression.   05/25/2021 she was found face down on the floor. She had bruising to her left forehead and abrasion to left knee. She was responsive and followed commands after incident. Family notified and did not want aggressive measures. On call NP advised to hold aspirin 81 mg x 3 days. Today, she is in her wheelchair  moving towards dayroom. She is alert to person, remembers bumping her head, does not remember how.  She is a poor historian due to dementia. Nursing denies any reports of headaches, blurred vision, nausea or vomiting. Vitals stable.   Dementia- continues to be followed by hospice, no recent behavioral outbursts, continues to have poor safety awareness HTN- BUN/creat 16/0.9 03/16/2021, remains on furosemide BLE- 1+ pitting edema, sits in wheelchair during day, furosemide daily Hypothyroidism- TSH 2.32 11/2020, levothyroxine daily Joint pain- denies joint pain today, moves arms and legs well in wheelchair, remains on tylenol and tramadol Depression/anxiety- no recent panic attacks, pleasant today, remains on Cymbalta, Klonopin, Depakote, and Risperdal   Past Medical History:  Diagnosis Date   Alzheimer disease ([place]) 10/27/2016   Arthritis    Confusion 04/04/2019   Depression, recurrent ([place]) 10/27/2016   History of fracture of right hip 04/04/2019   07/01/19 Ortho, R hip incision healed, X-ray looks good, s/p IMN R hip fx, WBAT, f/u 6 mos.    HTN (hypertension) 10/27/2016   Hypertension    Hypertensive kidney disease with CKD (chronic kidney disease) stage V ([place]) 11/02/2016   11/02/16 Na 136, K 4.7, Bun 30, creat 1.52, TSH 0.86, wbc 6.7, Hgb 11.3, plt 264   Hypothyroidism 10/27/2016   Osteoarthritis 10/27/2016   Thyroid disease    Past Surgical History:  Procedure Laterality Date   ABDOMINAL HYSTERECTOMY  1986   INTRAMEDULLARY (IM) NAIL INTERTROCHANTERIC Right 04/04/2019   Procedure: INTRAMEDULLARY (IM) NAIL INTERTROCHANTRIC;  Surgeon: Rod Can,  MD;  Location: [facility];  Service: Orthopedics;  Laterality: Right;   KNEE ARTHROSCOPY  2015/2017   Dr. Gustavus Bryant   SPINE SURGERY  2009, 2011   Dr.Mark Nicoletta Dress   TIBIA IM NAIL INSERTION Right 01/01/2020   Procedure: INTRAMEDULLARY (IM) NAIL TIBIAL;  Surgeon: Shona Needles, MD;  Location: [place];  Service: Orthopedics;  Laterality: Right;    No  Known Allergies  Allergies as of 05/26/2021   No Known Allergies      Medication List        Accurate as of May 26, 2021  2:42 PM. If you have any questions, ask your nurse or doctor.          STOP taking these medications    feeding supplement (PRO-STAT SUGAR FREE 64) Liqd Stopped by: Yvonna Alanis, NP   mupirocin ointment 2 % Commonly known as: BACTROBAN Stopped by: Yvonna Alanis, NP   Therems-M Tabs Stopped by: Yvonna Alanis, NP       TAKE these medications    acetaminophen 325 MG tablet Commonly known as: TYLENOL Take 650 mg by mouth 2 (two) times daily.   aspirin 81 MG chewable tablet Chew 81 mg by mouth daily.   clonazePAM 0.5 MG tablet Commonly known as: KLONOPIN Take 0.5 mg by mouth every 6 (six) hours as needed for anxiety.   divalproex 125 MG DR tablet Commonly known as: DEPAKOTE Take 250 mg by mouth at bedtime.   DULoxetine 30 MG capsule Commonly known as: CYMBALTA Take 30 mg by mouth daily.   furosemide 20 MG tablet Commonly known as: LASIX Take 20 mg by mouth daily.   furosemide 40 MG tablet Commonly known as: LASIX Take 1 tablet (40 mg total) by mouth daily for 22 days. 40 mg daily x 2 weeks, then return to 20 mg daily.   levothyroxine 25 MCG tablet Commonly known as: SYNTHROID Take 25 mcg by mouth daily before breakfast.   nystatin powder Commonly known as: MYCOSTATIN/NYSTOP Apply 1 application topically in the morning and at bedtime. Apply to groin skin folds, and ABD folds.   ondansetron 4 MG/5ML solution Commonly known as: ZOFRAN Take 4 mg by mouth daily as needed for nausea or vomiting.   polyethylene glycol 17 g packet Commonly known as: MIRALAX / GLYCOLAX Take 17 g by mouth daily.   potassium chloride 10 MEQ CR capsule Commonly known as: MICRO-K Take 10 mEq by mouth daily.   risperiDONE 1 MG tablet Commonly known as: RISPERDAL Take 1 mg by mouth at bedtime.   traMADol 50 MG tablet Commonly known as: ULTRAM Take  0.5 tablets (25 mg total) by mouth every 6 (six) hours as needed.   traMADol 50 MG tablet Commonly known as: ULTRAM Take 25 mg by mouth 2 (two) times daily.        Review of Systems  Unable to perform ROS: Dementia   Immunization History  Administered Date(s) Administered   Influenza-Unspecified 05/29/2017, 05/20/2020   Moderna SARS-COV2 Booster Vaccination 01/05/2021   Moderna Sars-Covid-2 Vaccination 09/07/2019, 10/05/2019, 06/16/2020   Pneumococcal Conjugate-13 06/21/2017   Pneumococcal Polysaccharide-23 07/16/2018   Tdap 06/21/2017   Pertinent  Health Maintenance Due  Topic Date Due   DEXA SCAN  Completed   INFLUENZA VACCINE  Discontinued   Fall Risk  02/01/2021 04/27/2018 04/25/2017  Falls in the past year? 1 No No  Number falls in past yr: 0 - -  Injury with Fall? 1 - -  Risk for fall due  to : History of fall(s) - -  Follow up Falls evaluation completed - -   Functional Status Survey:    Vitals:   05/26/21 1434  BP: 139/77  Pulse: 80  Resp: 16  Temp: (!) 97.4 F (36.3 C)  SpO2: 94%  Weight: 148 lb 9.6 oz (67.4 kg)  Height: 4\' 11"  (1.499 m)   Body mass index is 30.01 kg/m. Physical Exam Vitals reviewed.  Constitutional:      General: She is not in acute distress. HENT:     Head: Normocephalic. Contusion present. No raccoon eyes, Battle's sign, abrasion, masses or laceration. Hair is normal.     Comments: 4-5 cm contusion to left forehead/scalp, skin mildly swollen, bruised, non tender to touch, no skin breakdown.     Right Ear: There is no impacted cerumen.     Left Ear: There is no impacted cerumen.     Nose: Nose normal.     Mouth/Throat:     Mouth: Mucous membranes are moist.     Pharynx: No oropharyngeal exudate or posterior oropharyngeal erythema.  Eyes:     General:        Right eye: No discharge.        Left eye: No discharge.     Extraocular Movements: Extraocular movements intact.     Pupils: Pupils are equal, round, and reactive to  light.  Neck:     Vascular: No carotid bruit.  Cardiovascular:     Rate and Rhythm: Normal rate and regular rhythm.     Pulses: Normal pulses.     Heart sounds: Normal heart sounds. No murmur heard. Pulmonary:     Effort: Pulmonary effort is normal. No respiratory distress.     Breath sounds: Normal breath sounds. No wheezing.  Abdominal:     General: Bowel sounds are normal. There is no distension.     Palpations: Abdomen is soft.     Tenderness: There is no abdominal tenderness.  Musculoskeletal:     Cervical back: Normal range of motion.     Left knee: No swelling, deformity, erythema or ecchymosis. Normal range of motion.     Right lower leg: Edema present.     Left lower leg: Edema present.     Comments: 1+ pitting edema, small dime sized abrasion to left knee, CDI, no drainage, surrounding skin intact.   Lymphadenopathy:     Cervical: No cervical adenopathy.  Neurological:     Mental Status: She is alert.    Labs reviewed: Recent Labs    06/03/20 0000 11/13/20 1325 03/16/21 0000  NA 139 139 140  K 4.9 4.5 4.1  CL 102 103 106  CO2 28* 27* 27*  BUN 25* 20 16  CREATININE 1.3* 1.0 0.9  CALCIUM 10.5 10.0 9.6   Recent Labs    11/13/20 1325  AST 9*  ALT 7  ALKPHOS 70  ALBUMIN 3.6   Recent Labs    11/13/20 1325  WBC 8.2  HGB 11.0*  HCT 34*  PLT 288   Lab Results  Component Value Date   TSH 2.32 11/13/2020   Lab Results  Component Value Date   HGBA1C 5.3 11/01/2016   No results found for: CHOL, HDL, LDLCALC, LDLDIRECT, TRIG, CHOLHDL  Significant Diagnostic Results in last 30 days:  No results found.  Assessment/Plan 1. Contusion of scalp, initial encounter - found face forward on floor by nursing - 4-5 cm contusion over left forehead/scalp - no changes in cognition or increased confusion -  no headaches, nausea, blurred vision - cont to hold aspirin 81 mg x 3 days  2. Mixed Alzheimer's and vascular dementia ([place]) - followed by hospice - no  recent behavioral outbursts - cont skilled nursing care  3. Essential hypertension - controlled - cont furosemide  4. Dependent edema - 1+ pitting edema - cont furosemide daily  5. Acquired hypothyroidism - TSH normal - cont levothyroxine  6. Generalized osteoarthritis of multiple sites - denies pain to joints - cont tylenol and tramadol  7. Anxiety and depression - stable with Depakote, Risperdal,  klonopin and Cymbalta    Family/ staff Communication: plan discussed with patient and nurse  Labs/tests ordered:   none

## 2021-06-01 ENCOUNTER — Other Ambulatory Visit: Payer: Self-pay | Admitting: Orthopedic Surgery

## 2021-06-15 ENCOUNTER — Encounter: Payer: Self-pay | Admitting: Internal Medicine

## 2021-06-15 ENCOUNTER — Non-Acute Institutional Stay (SKILLED_NURSING_FACILITY): Admitting: Internal Medicine

## 2021-06-15 DIAGNOSIS — G309 Alzheimer's disease, unspecified: Secondary | ICD-10-CM | POA: Diagnosis not present

## 2021-06-15 DIAGNOSIS — R600 Localized edema: Secondary | ICD-10-CM | POA: Diagnosis not present

## 2021-06-15 DIAGNOSIS — F339 Major depressive disorder, recurrent, unspecified: Secondary | ICD-10-CM | POA: Diagnosis not present

## 2021-06-15 DIAGNOSIS — F0150 Vascular dementia without behavioral disturbance: Secondary | ICD-10-CM

## 2021-06-15 DIAGNOSIS — E039 Hypothyroidism, unspecified: Secondary | ICD-10-CM

## 2021-06-15 DIAGNOSIS — F0280 Dementia in other diseases classified elsewhere without behavioral disturbance: Secondary | ICD-10-CM

## 2021-06-15 NOTE — Progress Notes (Signed)
Location:   [place] Room Number: 42 Place of Service:  SNF [phone]) Provider:  Veleta Miners MD  [name], MD  Patient Care Team: [name], MD as PCP - General (Internal Medicine) Mast, Man X, NP as Nurse Practitioner (Internal Medicine) [name], MD (Internal Medicine)  Extended Emergency Contact Information Primary Emergency Contact: [facility] Address: [address]          [place], [place] [address] [name] of [place] Phone: [phone] Relation: Son Secondary Emergency Contact: [facility] Address: [address] [place] [address] [name] Phone: [phone] Relation: Daughter  Code Status:  DNR Hospice Goals of care: Advanced Directive information Advanced Directives 06/15/2021  Does Patient Have a Medical Advance Directive? Yes  Type of Advance Directive [place]  Does patient want to make changes to medical advance directive? No - Patient declined  Copy of [place] in Chart? Yes - validated most recent copy scanned in chart (See row information)  Would patient like information on creating a medical advance directive? -  Pre-existing out of facility DNR order (yellow form or pink MOST form) -     Chief Complaint  Patient presents with   Medical Management of Chronic Issues   Quality Metric Gaps    Shingrix    HPI:  Pt is a 87 y.o. female seen today for medical management of chronic diseases.    Patient is long term care resident Patient  has h/o Hypertension,  Hypothyroidism , Alzheimer's Dementia with behavior issues, Depression, B12 def, S/P Right Hip fracture with Intramedullary Fixation in 8/20 H/O Colitis in 06/21 and Right Tibia Fibula Fracture in 5/21 due to fall S/P IM Placement  H/o Progressive Dementia Enrolled in Hospice No New Issues Weight stable Continues to be Risk of falls Wheelchair bound Resists care  sometimes  Past Medical History:  Diagnosis Date   Alzheimer disease ([place]) 10/27/2016   Arthritis    Confusion 04/04/2019   Depression, recurrent ([place]) 10/27/2016   History of fracture of right hip 04/04/2019   07/01/19 Ortho, R hip incision healed, X-ray looks good, s/p IMN R hip fx, WBAT, f/u 6 mos.    HTN (hypertension) 10/27/2016   Hypertension    Hypertensive kidney disease with CKD (chronic kidney disease) stage V ([place]) 11/02/2016   11/02/16 Na 136, K 4.7, Bun 30, creat 1.52, TSH 0.86, wbc 6.7, Hgb 11.3, plt 264   Hypothyroidism 10/27/2016   Osteoarthritis 10/27/2016   Thyroid disease    Past Surgical History:  Procedure Laterality Date   ABDOMINAL HYSTERECTOMY  1986   INTRAMEDULLARY (IM) NAIL INTERTROCHANTERIC Right 04/04/2019   Procedure: INTRAMEDULLARY (IM) NAIL INTERTROCHANTRIC;  Surgeon: Rod Can, MD;  [facility];  Service: Orthopedics;  Laterality: Right;   KNEE ARTHROSCOPY  2015/2017   Dr. Gustavus Bryant   SPINE SURGERY  2009, 2011   Dr.Mark Nicoletta Dress   TIBIA IM NAIL INSERTION Right 01/01/2020   Procedure: INTRAMEDULLARY (IM) NAIL TIBIAL;  Surgeon: Shona Needles, MD;  Location: [place];  Service: Orthopedics;  Laterality: Right;    No Known Allergies  Allergies as of 06/15/2021   No Known Allergies      Medication List        Accurate as of June 15, 2021  3:21 PM. If you have any questions, ask your nurse or doctor.  STOP taking these medications    nystatin powder Commonly known as: MYCOSTATIN/NYSTOP Stopped by: [name], MD       TAKE these medications    acetaminophen 325 MG tablet Commonly known as: TYLENOL Take 650 mg by mouth 2 (two) times daily.   clonazePAM 0.5 MG tablet Commonly known as: KLONOPIN Take 0.5 mg by mouth every 6 (six) hours as needed for anxiety.   divalproex 125 MG DR tablet Commonly known as: DEPAKOTE Take 250 mg by mouth at bedtime.   DULoxetine 30 MG capsule Commonly known as: CYMBALTA Take 30 mg  by mouth daily.   furosemide 20 MG tablet Commonly known as: LASIX Take 20 mg by mouth daily.   furosemide 40 MG tablet Commonly known as: LASIX Take 1 tablet (40 mg total) by mouth daily for 22 days. 40 mg daily x 2 weeks, then return to 20 mg daily.   levothyroxine 25 MCG tablet Commonly known as: SYNTHROID Take 25 mcg by mouth daily before breakfast.   ondansetron 4 MG/5ML solution Commonly known as: ZOFRAN Take 4 mg by mouth daily as needed for nausea or vomiting.   polyethylene glycol 17 g packet Commonly known as: MIRALAX / GLYCOLAX Take 17 g by mouth daily.   potassium chloride 10 MEQ CR capsule Commonly known as: MICRO-K Take 10 mEq by mouth daily.   risperiDONE 1 MG tablet Commonly known as: RISPERDAL Take 1 mg by mouth at bedtime.   traMADol 50 MG tablet Commonly known as: ULTRAM Take 0.5 tablets (25 mg total) by mouth every 6 (six) hours as needed.   traMADol 50 MG tablet Commonly known as: ULTRAM Take 25 mg by mouth 2 (two) times daily.        Review of Systems  Unable to perform ROS: Dementia   Immunization History  Administered Date(s) Administered   Influenza-Unspecified 05/29/2017, 05/20/2020   Moderna SARS-COV2 Booster Vaccination 01/05/2021   Moderna Sars-Covid-2 Vaccination 09/07/2019, 10/05/2019, 06/16/2020   PFIZER(Purple Top)SARS-COV-2 Vaccination 04/27/2021   Pneumococcal Conjugate-13 06/21/2017   Pneumococcal Polysaccharide-23 07/16/2018   Tdap 06/21/2017   Pertinent  Health Maintenance Due  Topic Date Due   DEXA SCAN  Completed   INFLUENZA VACCINE  Discontinued   Fall Risk 01/10/2020 01/11/2020 01/11/2020 01/12/2020 02/01/2021  Falls in the past year? - - - - 1  Was there an injury with Fall? - - - - 1  Fall Risk Category Calculator - - - - 2  Fall Risk Category - - - - Moderate  Patient Fall Risk Level High fall risk High fall risk High fall risk High fall risk High fall risk  Patient at Risk for Falls Due to - - - - History of  fall(s)  Fall risk Follow up - - - - Falls evaluation completed   Functional Status Survey:    Vitals:   06/15/21 1512  BP: 131/88  Pulse: 83  Resp: 18  Temp: (!) 96.9 F (36.1 C)  SpO2: 94%  Weight: 143 lb 9.6 oz (65.1 kg)  Height: 4\' 11"  (1.499 m)   Body mass index is 29 kg/m. Physical Exam Vitals reviewed.  Constitutional:      Appearance: Normal appearance.     Comments: Swollen Face with Puffy Eyres as she keeps her Head down all   HENT:     Head: Normocephalic.     Nose: Nose normal.     Mouth/Throat:     Mouth: Mucous membranes are moist.     Pharynx: Oropharynx  is clear.  Eyes:     Pupils: Pupils are equal, round, and reactive to light.  Cardiovascular:     Rate and Rhythm: Normal rate and regular rhythm.     Pulses: Normal pulses.  Pulmonary:     Effort: Pulmonary effort is normal.     Breath sounds: Normal breath sounds.  Abdominal:     General: Abdomen is flat. Bowel sounds are normal.     Palpations: Abdomen is soft.  Musculoskeletal:        General: Swelling present.     Cervical back: Neck supple.  Skin:    General: Skin is warm.  Neurological:     General: No focal deficit present.     Mental Status: She is alert.  Psychiatric:        Mood and Affect: Mood normal.        Thought Content: Thought content normal.     Comments: Wheelchair Bound. Responds Does not follow much commands    Labs reviewed: Recent Labs    11/13/20 1325 03/16/21 0000  NA 139 140  K 4.5 4.1  CL 103 106  CO2 27* 27*  BUN 20 16  CREATININE 1.0 0.9  CALCIUM 10.0 9.6   Recent Labs    11/13/20 1325  AST 9*  ALT 7  ALKPHOS 70  ALBUMIN 3.6   Recent Labs    11/13/20 1325  WBC 8.2  HGB 11.0*  HCT 34*  PLT 288   Lab Results  Component Value Date   TSH 2.32 11/13/2020   Lab Results  Component Value Date   HGBA1C 5.3 11/01/2016   No results found for: CHOL, HDL, LDLCALC, LDLDIRECT, TRIG, CHOLHDL  Significant Diagnostic Results in last 30 days:  No  results found.  Assessment/Plan Bilateral leg edema On Lasix No Change in the dose Will check BMP as has been on high dose sine 8/22 Mixed Alzheimer's and vascular dementia ([place]) Supportive care Depakote and Risperdal  for Behaviors Acquired hypothyroidism TSH normal in 4/212 Depression, recurrent ([place]) Continue Cymbalta Enrolled in Hospcie   Family/ staff Communication:   Labs/tests ordered:

## 2021-06-25 LAB — BASIC METABOLIC PANEL
BUN: 26 — AB (ref 4–21)
CO2: 28 — AB (ref 13–22)
Chloride: 105 (ref 99–108)
Creatinine: 1.1 (ref 0.5–1.1)
Glucose: 97
Potassium: 3.8 (ref 3.4–5.3)
Sodium: 144 (ref 137–147)

## 2021-06-25 LAB — COMPREHENSIVE METABOLIC PANEL: Calcium: 9.7 (ref 8.7–10.7)

## 2021-07-05 ENCOUNTER — Non-Acute Institutional Stay (SKILLED_NURSING_FACILITY): Admitting: Nurse Practitioner

## 2021-07-05 DIAGNOSIS — M159 Polyosteoarthritis, unspecified: Secondary | ICD-10-CM

## 2021-07-05 DIAGNOSIS — F0150 Vascular dementia without behavioral disturbance: Secondary | ICD-10-CM

## 2021-07-05 DIAGNOSIS — E039 Hypothyroidism, unspecified: Secondary | ICD-10-CM

## 2021-07-05 DIAGNOSIS — N816 Rectocele: Secondary | ICD-10-CM | POA: Diagnosis not present

## 2021-07-05 DIAGNOSIS — L03115 Cellulitis of right lower limb: Secondary | ICD-10-CM

## 2021-07-05 DIAGNOSIS — F419 Anxiety disorder, unspecified: Secondary | ICD-10-CM

## 2021-07-05 DIAGNOSIS — F0280 Dementia in other diseases classified elsewhere without behavioral disturbance: Secondary | ICD-10-CM

## 2021-07-05 DIAGNOSIS — K5901 Slow transit constipation: Secondary | ICD-10-CM | POA: Diagnosis not present

## 2021-07-05 DIAGNOSIS — F32A Depression, unspecified: Secondary | ICD-10-CM

## 2021-07-05 DIAGNOSIS — R609 Edema, unspecified: Secondary | ICD-10-CM | POA: Diagnosis not present

## 2021-07-05 DIAGNOSIS — G309 Alzheimer's disease, unspecified: Secondary | ICD-10-CM

## 2021-07-05 DIAGNOSIS — I10 Essential (primary) hypertension: Secondary | ICD-10-CM

## 2021-07-05 DIAGNOSIS — N1831 Chronic kidney disease, stage 3a: Secondary | ICD-10-CM

## 2021-07-05 NOTE — Assessment & Plan Note (Signed)
Avoid constipation 

## 2021-07-05 NOTE — Assessment & Plan Note (Addendum)
Redness, warmth, tenderness, drainage from previous scabbed over pressure area, will treat with Doxy 100mg  bid x 10 days, X-ray R heel to r/o osteomyelitis, apply Bactroban to the area daily until healed.  07/06/21 Xray achilles tendon spur.

## 2021-07-05 NOTE — Assessment & Plan Note (Signed)
Her mood and behaviors are managed on Risperdal,  Depakote, Duloxetine, Clonazepam prn,  TSH 2.32 11/13/20

## 2021-07-05 NOTE — Assessment & Plan Note (Signed)
takes Levothyroxine. TSH 2.32 11/13/20

## 2021-07-05 NOTE — Assessment & Plan Note (Addendum)
Blood pressure runs low, takes Furosemide, ASA

## 2021-07-05 NOTE — Assessment & Plan Note (Signed)
Stable, continue MiraLax.  °

## 2021-07-05 NOTE — Assessment & Plan Note (Signed)
better, on Furosemide, Bun/creat 16/0.9 03/16/21

## 2021-07-05 NOTE — Assessment & Plan Note (Signed)
Multiple sites, managed with Tylenol, Tramadol.  

## 2021-07-05 NOTE — Progress Notes (Addendum)
Location:   SNF [place] Room Number: 42A Place of Service:  SNF (31) Provider: [facility] Dierdre Mccalip NP  [name], MD  Patient Care Team: [name], MD as PCP - General (Internal Medicine) Ulrich Soules X, NP as Nurse Practitioner (Internal Medicine) [name], MD (Internal Medicine)  Extended Emergency Contact Information Primary Emergency Contact: [facility] Address: [address]          [place], [place] [address] [name] of [place] Phone: [phone] Relation: Son Secondary Emergency Contact: [facility] Address: [address] [place] [address] [name] Phone: [phone] Relation: Daughter  Code Status: DNR Goals of care: Advanced Directive information Advanced Directives 07/06/2021  Does Patient Have a Medical Advance Directive? Yes  Type of Advance Directive [place]  Does patient want to make changes to medical advance directive? No - Patient declined  Copy of [place] in Chart? Yes - validated most recent copy scanned in chart (See row information)  Would patient like information on creating a medical advance directive? -  Pre-existing out of facility DNR order (yellow form or pink MOST form) -     Chief Complaint  Patient presents with  . Acute Visit    Left heel redness.      HPI:  Pt is a 87 y.o. female seen today for an acute visit for reported the patient's R heel rash.      Tested positive COVID. Supportive care, under Hospice service.             Pressure ulcers R+L heels, scabbed over              Edema, dependent, better, on Furosemide, Bun/creat 28/1.1 06/25/21             Constipation, takes MiraLax.             Rectocele avoid constipation             Dementia, impulsive, no safety awareness, off  [facility], resides in SN[facility], under Hospice service.              Depression/anxiety: takes Risperdal,  Depakote, Duloxetine, Clonazepam prn,  TSH  2.32 11/13/20             HTN, takes Furosemide, ASA  Bun/creat 26/1.1 06/25/21             Hypothyroidism, takes Levothyroxine. TSH 2.32 11/13/20             OA,  Multiple sites, managed with Tylenol, Tramadol.   Past Medical History:  Diagnosis Date  . Alzheimer disease ([place]) 10/27/2016  . Arthritis   . Confusion 04/04/2019  . Depression, recurrent ([place]) 10/27/2016  . History of fracture of right hip 04/04/2019   07/01/19 Ortho, R hip incision healed, X-ray looks good, s/p IMN R hip fx, WBAT, f/u 6 mos.   Marland Kitchen HTN (hypertension) 10/27/2016  . Hypertension   . Hypertensive kidney disease with CKD (chronic kidney disease) stage V ([place]) 11/02/2016   11/02/16 Na 136, K 4.7, Bun 30, creat 1.52, TSH 0.86, wbc 6.7, Hgb 11.3, plt 264  . Hypothyroidism 10/27/2016  . Osteoarthritis 10/27/2016  . Thyroid disease    Past Surgical History:  Procedure Laterality Date  . ABDOMINAL HYSTERECTOMY  1986  . INTRAMEDULLARY (IM) NAIL INTERTROCHANTERIC Right 04/04/2019   Procedure: INTRAMEDULLARY (IM) NAIL INTERTROCHANTRIC;  Surgeon: Rod Can, MD;  Location: [facility];  Service: Orthopedics;  Laterality: Right;  . KNEE ARTHROSCOPY  2015/2017   Dr. Gustavus Bryant  . SPINE SURGERY  2009, 2011   Dr.Mark Nicoletta [facility]  . TIBIA IM NAIL INSERTION Right 01/01/2020   Procedure: INTRAMEDULLARY (IM) NAIL TIBIAL;  Surgeon: Shona Needles, MD;  Location: [place];  Service: Orthopedics;  Laterality: Right;    No Known Allergies  Allergies as of 07/05/2021   No Known Allergies      Medication List        Accurate as of July 05, 2021 11:59 PM. If you have any questions, ask your nurse or doctor.          acetaminophen 325 MG tablet Commonly known as: TYLENOL Take 650 mg by mouth 2 (two) times daily.   clonazePAM 0.5 MG tablet Commonly known as: KLONOPIN Take 0.5 mg by mouth every 6 (six) hours as needed for anxiety.   divalproex 125 MG DR tablet Commonly known as: DEPAKOTE Take 250 mg by mouth at bedtime.    DULoxetine 30 MG capsule Commonly known as: CYMBALTA Take 30 mg by mouth daily.   furosemide 20 MG tablet Commonly known as: LASIX Take 20 mg by mouth every evening.   furosemide 40 MG tablet Commonly known as: LASIX Take 40 mg by mouth every morning.   levothyroxine 25 MCG tablet Commonly known as: SYNTHROID Take 25 mcg by mouth daily before breakfast.   ondansetron 4 MG/5ML solution Commonly known as: ZOFRAN Take 4 mg by mouth daily as needed for nausea or vomiting.   polyethylene glycol 17 g packet Commonly known as: MIRALAX / GLYCOLAX Take 17 g by mouth daily.   potassium chloride 10 MEQ CR capsule Commonly known as: MICRO-K Take 10 mEq by mouth daily.   risperiDONE 1 MG tablet Commonly known as: RISPERDAL Take 1 mg by mouth at bedtime.   traMADol 50 MG tablet Commonly known as: ULTRAM Take 0.5 tablets (25 mg total) by mouth every 6 (six) hours as needed.   traMADol 50 MG tablet Commonly known as: ULTRAM Take 25 mg by mouth 2 (two) times daily.        Review of Systems  Unable to perform ROS: Dementia   Immunization History  Administered Date(s) Administered  . Influenza-Unspecified 05/29/2017, 05/20/2020  . Moderna SARS-COV2 Booster Vaccination 01/05/2021  . Moderna Sars-Covid-2 Vaccination 09/07/2019, 10/05/2019, 06/16/2020  . PFIZER(Purple Top)SARS-COV-2 Vaccination 04/27/2021  . Pneumococcal Conjugate-13 06/21/2017  . Pneumococcal Polysaccharide-23 07/16/2018  . Tdap 06/21/2017   Pertinent  Health Maintenance Due  Topic Date Due  . DEXA SCAN  Completed  . INFLUENZA VACCINE  Discontinued   Fall Risk 01/10/2020 01/11/2020 01/11/2020 01/12/2020 02/01/2021  Falls in the past year? - - - - 1  Was there an injury with Fall? - - - - 1  Fall Risk Category Calculator - - - - 2  Fall Risk Category - - - - Moderate  Patient Fall Risk Level High fall risk High fall risk High fall risk High fall risk High fall risk  Patient at Risk for Falls Due to - - - -  History of fall(s)  Fall risk Follow up - - - - Falls evaluation completed   Functional Status Survey:    Vitals:   07/05/21 1454  BP: (!) 108/50  Pulse: 76  Resp: 20  Temp: (!) 97.2 F (36.2 C)  SpO2: 92%   There is no height or weight on file to calculate BMI. Physical Exam Vitals and nursing note  reviewed.  Constitutional:      Appearance: Normal appearance.  HENT:     Head: Normocephalic and atraumatic.     Nose: Nose normal.     Mouth/Throat:     Mouth: Mucous membranes are moist.  Eyes:     Extraocular Movements: Extraocular movements intact.     Conjunctiva/sclera: Conjunctivae normal.     Pupils: Pupils are equal, round, and reactive to light.  Cardiovascular:     Rate and Rhythm: Normal rate and regular rhythm.     Heart sounds: No murmur heard.    Comments: DP pulses present R+L Pulmonary:     Effort: Pulmonary effort is normal.     Breath sounds: No rales.  Abdominal:     General: Bowel sounds are normal.     Palpations: Abdomen is soft.     Tenderness: There is no abdominal tenderness.  Musculoskeletal:     Right lower leg: Edema present.     Left lower leg: Edema present.     Comments: R s/p IM rod for tibia fx. Edema trace BLE, dependent   Skin:    General: Skin is warm and dry.     Findings: Erythema present.     Comments: R+L heel scabbed over area from healed previous pressure ulcers. R heel: rednesss, warmth, swelling, tenderness, drainage from previous healed pressure ulcer scar region.   Neurological:     General: No focal deficit present.     Mental Status: She is alert.     Gait: Gait abnormal.  Psychiatric:        Mood and Affect: Mood normal.     Comments: Followed simple directions during today's examination, but confused.     Labs reviewed: Recent Labs    11/13/20 1325 03/16/21 0000 06/25/21 0000  NA 139 140 144  K 4.5 4.1 3.8  CL 103 106 105  CO2 27* 27* 28*  BUN 20 16 26*  CREATININE 1.0 0.9 1.1  CALCIUM 10.0 9.6 9.7    Recent Labs    11/13/20 1325  AST 9*  ALT 7  ALKPHOS 70  ALBUMIN 3.6   Recent Labs    11/13/20 1325  WBC 8.2  HGB 11.0*  HCT 34*  PLT 288   Lab Results  Component Value Date   TSH 2.32 11/13/2020   Lab Results  Component Value Date   HGBA1C 5.3 11/01/2016   No results found for: CHOL, HDL, LDLCALC, LDLDIRECT, TRIG, CHOLHDL  Significant Diagnostic Results in last 30 days:  No results found.  Assessment/Plan: Cellulitis of right heel Redness, warmth, tenderness, drainage from previous scabbed over pressure area, will treat with Doxy 100mg  bid x 10 days, X-ray R heel to r/o osteomyelitis, apply Bactroban to the area daily until healed.  07/06/21 Xray achilles tendon spur.   Rectocele Avoid constipation.   Slow transit constipation Stable, continue MiraLax.   Dependent edema  better, on Furosemide, Bun/creat 16/0.9 03/16/21  Mixed Alzheimer's and vascular dementia ([place]) , impulsive, no safety awareness, off  [facility], resides in [facility], under Hospice service.   Anxiety and depression Her mood and behaviors are managed on Risperdal,  Depakote, Duloxetine, Clonazepam prn,  TSH 2.32 11/13/20  Essential hypertension Blood pressure runs low, takes Furosemide, ASA    Hypothyroidism takes Levothyroxine. TSH 2.32 11/13/20  Generalized osteoarthritis of multiple sites Multiple sites, managed with Tylenol, Tramadol.   Chronic kidney disease, stage 3a ([place]) Bun/creat 28/1.1 06/25/21    Family/ staff Communication: plan of care reviewed  with the patient and charge nurse.   Labs/tests ordered:  X-ray R heel ap/lateral views  Time spend 35 minutes.

## 2021-07-05 NOTE — Assessment & Plan Note (Signed)
,   impulsive, no safety awareness, off  [facility], resides in [facility], under Hospice service.

## 2021-07-06 ENCOUNTER — Encounter: Payer: Self-pay | Admitting: Internal Medicine

## 2021-07-06 ENCOUNTER — Encounter: Payer: Self-pay | Admitting: Nurse Practitioner

## 2021-07-06 NOTE — Assessment & Plan Note (Signed)
Bun/creat 28/1.1 06/25/21

## 2021-07-06 NOTE — Progress Notes (Signed)
Location:   [place] Room Number: 42 Place of Service:  SNF [phone]) Provider:  Veleta Miners MD  [name], MD  Patient Care Team: [name], MD as PCP - General (Internal Medicine) Mast, Man X, NP as Nurse Practitioner (Internal Medicine) [name], MD (Internal Medicine)  Extended Emergency Contact Information Primary Emergency Contact: [facility] Address: [address]          [place], [place] [address] [name] of [place] Phone: [phone] Relation: Son Secondary Emergency Contact: [facility] Address: [address] [place] [address] [name] Phone: [phone] Relation: Daughter  Code Status:  DNR Hospice Goals of care: Advanced Directive information Advanced Directives 07/06/2021  Does Patient Have a Medical Advance Directive? Yes  Type of Advance Directive [place]  Does patient want to make changes to medical advance directive? No - Patient declined  Copy of [place] in Chart? Yes - validated most recent copy scanned in chart (See row information)  Would patient like information on creating a medical advance directive? -  Pre-existing out of facility DNR order (yellow form or pink MOST form) -     Chief Complaint  Patient presents with   Acute Visit    Heel Infection    HPI:  Pt is a 87 y.o. female seen today for an acute visit for    Past Medical History:  Diagnosis Date   Alzheimer disease ([place]) 10/27/2016   Arthritis    Confusion 04/04/2019   Depression, recurrent ([place]) 10/27/2016   History of fracture of right hip 04/04/2019   07/01/19 Ortho, R hip incision healed, X-ray looks good, s/p IMN R hip fx, WBAT, f/u 6 mos.    HTN (hypertension) 10/27/2016   Hypertension    Hypertensive kidney disease with CKD (chronic kidney disease) stage V ([place]) 11/02/2016   11/02/16 Na 136, K 4.7, Bun 30, creat 1.52, TSH 0.86, wbc 6.7, Hgb  11.3, plt 264   Hypothyroidism 10/27/2016   Osteoarthritis 10/27/2016   Thyroid disease    Past Surgical History:  Procedure Laterality Date   ABDOMINAL HYSTERECTOMY  1986   INTRAMEDULLARY (IM) NAIL INTERTROCHANTERIC Right 04/04/2019   Procedure: INTRAMEDULLARY (IM) NAIL INTERTROCHANTRIC;  Surgeon: Rod Can, MD;  Location: [facility];  Service: Orthopedics;  Laterality: Right;   KNEE ARTHROSCOPY  2015/2017   Dr. Gustavus Bryant   SPINE SURGERY  2009, 2011   Dr.Mark Nicoletta [facility]   TIBIA IM NAIL INSERTION Right 01/01/2020   Procedure: INTRAMEDULLARY (IM) NAIL TIBIAL;  Surgeon: Shona Needles, MD;  Location: [facility];  Service: Orthopedics;  Laterality: Right;    No Known Allergies  Allergies as of 07/06/2021   No Known Allergies      Medication List        Accurate as of July 06, 2021  2:53 PM. If you have any questions, ask your nurse or doctor.          acetaminophen 325 MG tablet Commonly known as: TYLENOL Take 650 mg by mouth 2 (two) times daily.   clonazePAM 0.5 MG tablet Commonly known as: KLONOPIN Take 0.5 mg by mouth every 6 (six) hours as needed for anxiety.   divalproex 125 MG DR tablet Commonly known as: DEPAKOTE Take 250 mg by mouth at bedtime.   doxycycline 100 MG tablet Commonly known as: VIBRA-TABS Take 100 mg by mouth 2 (two) times daily.  DULoxetine 30 MG capsule Commonly known as: CYMBALTA Take 30 mg by mouth daily.   furosemide 20 MG tablet Commonly known as: LASIX Take 20 mg by mouth every evening.   furosemide 40 MG tablet Commonly known as: LASIX Take 40 mg by mouth every morning.   levothyroxine 25 MCG tablet Commonly known as: SYNTHROID Take 25 mcg by mouth daily before breakfast.   mupirocin ointment 2 % Commonly known as: BACTROBAN Apply 1 application topically daily. Apply to right heel daily until healed   ondansetron 4 MG/5ML solution Commonly known as: ZOFRAN Take 4 mg by mouth daily as needed for nausea or vomiting.    polyethylene glycol 17 g packet Commonly known as: MIRALAX / GLYCOLAX Take 17 g by mouth daily.   potassium chloride 10 MEQ CR capsule Commonly known as: MICRO-K Take 10 mEq by mouth daily.   risperiDONE 1 MG tablet Commonly known as: RISPERDAL Take 1 mg by mouth at bedtime.   saccharomyces boulardii 250 MG capsule Commonly known as: FLORASTOR Take 250 mg by mouth 2 (two) times daily.   traMADol 50 MG tablet Commonly known as: ULTRAM Take 0.5 tablets (25 mg total) by mouth every 6 (six) hours as needed.   traMADol 50 MG tablet Commonly known as: ULTRAM Take 25 mg by mouth 2 (two) times daily.        Review of Systems  Immunization History  Administered Date(s) Administered   Influenza-Unspecified 05/29/2017, 05/20/2020   Moderna SARS-COV2 Booster Vaccination 01/05/2021   Moderna Sars-Covid-2 Vaccination 09/07/2019, 10/05/2019, 06/16/2020   PFIZER(Purple Top)SARS-COV-2 Vaccination 04/27/2021   Pneumococcal Conjugate-13 06/21/2017   Pneumococcal Polysaccharide-23 07/16/2018   Tdap 06/21/2017   Pertinent  Health Maintenance Due  Topic Date Due   DEXA SCAN  Completed   INFLUENZA VACCINE  Discontinued   Fall Risk 01/10/2020 01/11/2020 01/11/2020 01/12/2020 02/01/2021  Falls in the past year? - - - - 1  Was there an injury with Fall? - - - - 1  Fall Risk Category Calculator - - - - 2  Fall Risk Category - - - - Moderate  Patient Fall Risk Level High fall risk High fall risk High fall risk High fall risk High fall risk  Patient at Risk for Falls Due to - - - - History of fall(s)  Fall risk Follow up - - - - Falls evaluation completed   Functional Status Survey:    Vitals:   07/06/21 1434  BP: (!) 108/50  Pulse: 76  Resp: 16  Temp: (!) 97.2 F (36.2 C)  SpO2: 92%  Weight: 143 lb 9.6 oz (65.1 kg)  Height: 4\' 11"  (1.499 m)   Body mass index is 29 kg/m. Physical Exam  Labs reviewed: Recent Labs    11/13/20 1325 03/16/21 0000 06/25/21 0000  NA 139 140 144   K 4.5 4.1 3.8  CL 103 106 105  CO2 27* 27* 28*  BUN 20 16 26*  CREATININE 1.0 0.9 1.1  CALCIUM 10.0 9.6 9.7   Recent Labs    11/13/20 1325  AST 9*  ALT 7  ALKPHOS 70  ALBUMIN 3.6   Recent Labs    11/13/20 1325  WBC 8.2  HGB 11.0*  HCT 34*  PLT 288   Lab Results  Component Value Date   TSH 2.32 11/13/2020   Lab Results  Component Value Date   HGBA1C 5.3 11/01/2016   No results found for: CHOL, HDL, LDLCALC, LDLDIRECT, TRIG, CHOLHDL  Significant Diagnostic Results in last 30  days:  No results found.  Assessment/Plan There are no diagnoses linked to this encounter.   Family/ staff Communication:   Labs/tests ordered:

## 2021-07-23 ENCOUNTER — Encounter: Payer: Self-pay | Admitting: Nurse Practitioner

## 2021-07-23 ENCOUNTER — Non-Acute Institutional Stay (SKILLED_NURSING_FACILITY): Payer: Medicare Other | Admitting: Nurse Practitioner

## 2021-07-23 DIAGNOSIS — L89610 Pressure ulcer of right heel, unstageable: Secondary | ICD-10-CM | POA: Diagnosis not present

## 2021-07-23 DIAGNOSIS — I10 Essential (primary) hypertension: Secondary | ICD-10-CM | POA: Diagnosis not present

## 2021-07-23 DIAGNOSIS — F32A Depression, unspecified: Secondary | ICD-10-CM

## 2021-07-23 DIAGNOSIS — R609 Edema, unspecified: Secondary | ICD-10-CM | POA: Diagnosis not present

## 2021-07-23 DIAGNOSIS — U071 COVID-19: Secondary | ICD-10-CM

## 2021-07-23 DIAGNOSIS — K5901 Slow transit constipation: Secondary | ICD-10-CM

## 2021-07-23 DIAGNOSIS — F0280 Dementia in other diseases classified elsewhere without behavioral disturbance: Secondary | ICD-10-CM

## 2021-07-23 DIAGNOSIS — F0150 Vascular dementia without behavioral disturbance: Secondary | ICD-10-CM

## 2021-07-23 DIAGNOSIS — L89620 Pressure ulcer of left heel, unstageable: Secondary | ICD-10-CM

## 2021-07-23 DIAGNOSIS — F419 Anxiety disorder, unspecified: Secondary | ICD-10-CM

## 2021-07-23 DIAGNOSIS — E039 Hypothyroidism, unspecified: Secondary | ICD-10-CM

## 2021-07-23 DIAGNOSIS — N816 Rectocele: Secondary | ICD-10-CM

## 2021-07-23 DIAGNOSIS — M159 Polyosteoarthritis, unspecified: Secondary | ICD-10-CM

## 2021-07-23 DIAGNOSIS — G309 Alzheimer's disease, unspecified: Secondary | ICD-10-CM

## 2021-07-23 NOTE — Assessment & Plan Note (Signed)
Dependent, minimal, on Furosemide, Bun/creat 28/1.1 06/25/21

## 2021-07-23 NOTE — Assessment & Plan Note (Signed)
Blood pressure runs low, only on Furosemide.

## 2021-07-23 NOTE — Assessment & Plan Note (Signed)
avoid constipation 

## 2021-07-23 NOTE — Assessment & Plan Note (Signed)
impulsive, no safety awareness, off  Memantine, resides in SNF [facility], under Hospice service.

## 2021-07-23 NOTE — Assessment & Plan Note (Signed)
akes Levothyroxine. TSH 2.32 11/13/20

## 2021-07-23 NOTE — Assessment & Plan Note (Signed)
Stable, takes MiraLax  

## 2021-07-23 NOTE — Assessment & Plan Note (Signed)
Occasionally emotional/behavirol outbursts,  takes Risperdal,  Depakote, Duloxetine, Clonazepam prn,  TSH 2.32 11/13/20

## 2021-07-23 NOTE — Progress Notes (Signed)
Location:   [place] Room Number: 35 A Place of Service:  SNF (31) Provider:  Durand Wittmeyer X, NP  [name], MD  Patient Care Team: [name], MD as PCP - General (Internal Medicine) Daxtin Leiker X, NP as Nurse Practitioner (Internal Medicine) [name], MD (Internal Medicine)  Extended Emergency Contact Information Primary Emergency Contact: [facility] Address: [address]          [place], [place] [address] [name] of [place] Phone: [phone] Relation: Son Secondary Emergency Contact: [facility] Address: [address] [place] [address] [name] Phone: [phone] Relation: Daughter  Code Status:  DNR Goals of care: Advanced Directive information Advanced Directives 07/23/2021  Does Patient Have a Medical Advance Directive? Yes  Type of Advance Directive [place]  Does patient want to make changes to medical advance directive? No - Patient declined  Copy of [place] in Chart? Yes - validated most recent copy scanned in chart (See row information)  Would patient like information on creating a medical advance directive? -  Pre-existing out of facility DNR order (yellow form or pink MOST form) -     Chief Complaint  Patient presents with   Medical Management of Chronic Issues    Routine visit   Quality Metric Gaps    Shingrix, COVID #5    HPI:  Pt is a 87 y.o. Cohen seen today for medical management of chronic diseases.      Tested positive COVID in the past, no weight loss, under Hospice service.             Pressure ulcers R+L heels, healed, scars.              Edema, dependent, better, on Furosemide, Bun/creat 28/1.1 06/25/21             Constipation, takes MiraLax.             Rectocele avoid constipation             Dementia, impulsive, no safety awareness, off  Memantine, resides in [facility], under Hospice service.               Depression/anxiety: takes Risperdal,  Depakote, Duloxetine, Clonazepam prn,  TSH 2.32 11/13/20             HTN, takes Furosemide, ASA  Bun/creat 26/1.1 06/25/21             Hypothyroidism, takes Levothyroxine. TSH 2.32 11/13/20             OA,  Multiple sites, managed with Tylenol, Tramadol.  Past Medical History:  Diagnosis Date   Alzheimer disease ([place]) 10/27/2016   Arthritis    Confusion 04/04/2019   Depression, recurrent ([place]) 10/27/2016   History of fracture of right hip 04/04/2019   07/01/19 Ortho, R hip incision healed, X-ray looks good, s/p IMN R hip fx, WBAT, f/u 6 mos.    HTN (hypertension) 10/27/2016   Hypertension    Hypertensive kidney disease with CKD (chronic kidney disease) stage V ([place]) 11/02/2016   11/02/16 Na 136, K 4.7, Bun 30, creat 1.52, TSH 0.86, wbc 6.7, Hgb 11.3, plt 264   Hypothyroidism 10/27/2016   Osteoarthritis 10/27/2016   Thyroid disease    Past Surgical History:  Procedure Laterality Date   ABDOMINAL HYSTERECTOMY  1986   INTRAMEDULLARY (IM) NAIL INTERTROCHANTERIC Right 04/04/2019  Procedure: INTRAMEDULLARY (IM) NAIL INTERTROCHANTRIC;  Surgeon: Rod Can, MD;  Location: [facility];  Service: Orthopedics;  Laterality: Right;   KNEE ARTHROSCOPY  2015/2017   Dr. Gustavus Bryant   SPINE SURGERY  2009, 2011   Dr.Mark Nicoletta Dress   TIBIA IM NAIL INSERTION Right 01/01/2020   Procedure: INTRAMEDULLARY (IM) NAIL TIBIAL;  Surgeon: Shona Needles, MD;  Location: [facility];  Service: Orthopedics;  Laterality: Right;    No Known Allergies  Allergies as of 07/23/2021   No Known Allergies      Medication List        Accurate as of July 23, 2021 11:59 PM. If you have any questions, ask your nurse or doctor.          acetaminophen 325 MG tablet Commonly known as: TYLENOL Take 650 mg by mouth 2 (two) times daily.   clonazePAM 0.5 MG tablet Commonly known as: KLONOPIN Take 0.5 mg by mouth every 6 (six) hours as needed for anxiety.   divalproex 125 MG DR tablet Commonly  known as: DEPAKOTE Take 250 mg by mouth at bedtime.   DULoxetine 30 MG capsule Commonly known as: CYMBALTA Take 30 mg by mouth daily.   furosemide 20 MG tablet Commonly known as: LASIX Take 20 mg by mouth every evening.   furosemide 40 MG tablet Commonly known as: LASIX Take 40 mg by mouth every morning.   levothyroxine 25 MCG tablet Commonly known as: SYNTHROID Take 25 mcg by mouth daily before breakfast.   ondansetron 4 MG/5ML solution Commonly known as: ZOFRAN Take 4 mg by mouth daily as needed for nausea or vomiting.   polyethylene glycol 17 g packet Commonly known as: MIRALAX / GLYCOLAX Take 17 g by mouth daily.   potassium chloride 10 MEQ CR capsule Commonly known as: MICRO-K Take 10 mEq by mouth daily.   risperiDONE 1 MG tablet Commonly known as: RISPERDAL Take 1 mg by mouth at bedtime.   traMADol 50 MG tablet Commonly known as: ULTRAM Take 0.5 tablets (25 mg total) by mouth every 6 (six) hours as needed.   traMADol 50 MG tablet Commonly known as: ULTRAM Take 25 mg by mouth 2 (two) times daily.        Review of Systems  Unable to perform ROS: Dementia   Immunization History  Administered Date(s) Administered   Influenza-Unspecified 05/29/2017, 05/20/2020, 05/26/2021   Moderna SARS-COV2 Booster Vaccination 01/05/2021   Moderna Sars-Covid-2 Vaccination 09/07/2019, 10/05/2019, 06/16/2020   PFIZER(Purple Top)SARS-COV-2 Vaccination 04/27/2021   Pneumococcal Conjugate-13 06/21/2017   Pneumococcal Polysaccharide-23 07/16/2018   Tdap 06/21/2017   Pertinent  Health Maintenance Due  Topic Date Due   DEXA SCAN  Completed   INFLUENZA VACCINE  Discontinued   Fall Risk 01/10/2020 01/11/2020 01/11/2020 01/12/2020 02/01/2021  Falls in the past year? - - - - 1  Was there an injury with Fall? - - - - 1  Fall Risk Category Calculator - - - - 2  Fall Risk Category - - - - Moderate  Patient Fall Risk Level High fall risk High fall risk High fall risk High fall risk High  fall risk  Patient at Risk for Falls Due to - - - - History of fall(s)  Fall risk Follow up - - - - Falls evaluation completed   Functional Status Survey:    Vitals:   07/23/21 1313  BP: (!) 121/56  Pulse: 75  Resp: 17  Temp: (!) 97.5 F (36.4 C)  SpO2: 95%  Weight: 145 lb 4.8  oz (65.9 kg)  Height: 4\' 11"  (1.499 m)   Body mass index is 29.35 kg/m. Physical Exam Vitals and nursing note reviewed.  Constitutional:      Appearance: Normal appearance.  HENT:     Head: Normocephalic and atraumatic.     Mouth/Throat:     Mouth: Mucous membranes are moist.  Eyes:     Extraocular Movements: Extraocular movements intact.     Conjunctiva/sclera: Conjunctivae normal.     Pupils: Pupils are equal, round, and reactive to light.  Cardiovascular:     Rate and Rhythm: Normal rate and regular rhythm.     Heart sounds: No murmur heard. Pulmonary:     Effort: Pulmonary effort is normal.     Breath sounds: No rales.  Abdominal:     General: Bowel sounds are normal.     Palpations: Abdomen is soft.     Tenderness: There is no abdominal tenderness.  Musculoskeletal:     Right lower leg: Edema present.     Left lower leg: Edema present.     Comments: Edema trace BLE, dependent, trace to 1+  Skin:    General: Skin is warm and dry.     Comments: R+L heel pressure ulcer scars.   Neurological:     General: No focal deficit present.     Mental Status: She is alert.     Gait: Gait abnormal.  Psychiatric:        Mood and Affect: Mood normal.     Comments: Followed simple directions during today's examination, but confused.     Labs reviewed: Recent Labs    11/13/20 1325 03/16/21 0000 06/25/21 0000  NA 139 140 144  K 4.5 4.1 3.8  CL 103 106 105  CO2 27* 27* 28*  BUN 20 16 26*  CREATININE 1.0 0.9 1.1  CALCIUM 10.0 9.6 9.7   Recent Labs    11/13/20 1325  AST 9*  ALT 7  ALKPHOS 70  ALBUMIN 3.6   Recent Labs    11/13/20 1325  WBC 8.2  HGB 11.0*  HCT 34*  PLT 288    Lab Results  Component Value Date   TSH 2.32 11/13/2020   Lab Results  Component Value Date   HGBA1C 5.3 11/01/2016   No results found for: CHOL, HDL, LDLCALC, LDLDIRECT, TRIG, CHOLHDL  Significant Diagnostic Results in last 30 days:  No results found.  Assessment/Plan Essential hypertension Blood pressure runs low, only on Furosemide.   Dependent edema Dependent, minimal, on Furosemide, Bun/creat 28/1.1 06/25/21  Pressure ulcer of both heels, unstageable ([place]) healed, scars.   COVID-19 virus infection Tested positive COVID in the past, no weight loss, under Hospice service.  Slow transit constipation Stable,  takes MiraLax.  Rectocele  avoid constipation  Mixed Alzheimer's and vascular dementia ([place]) impulsive, no safety awareness, off  Memantine, resides in [facility], under Hospice service.   Anxiety and depression Occasionally emotional/behavirol outbursts,  takes Risperdal,  Depakote, Duloxetine, Clonazepam prn,  TSH 2.32 11/13/20  Hypothyroidism akes Levothyroxine. TSH 2.32 11/13/20  Generalized osteoarthritis of multiple sites Multiple sites, managed with Tylenol, Tramadol.      Family/ staff Communication: plan of care reviewed with the patient and charge nurse.   Labs/tests ordered:  none  Time spend 25 minutes.

## 2021-07-23 NOTE — Assessment & Plan Note (Signed)
Multiple sites, managed with Tylenol, Tramadol.  

## 2021-07-23 NOTE — Assessment & Plan Note (Signed)
Tested positive COVID in the past, no weight loss, under Hospice service.

## 2021-07-23 NOTE — Assessment & Plan Note (Signed)
healed, scars.

## 2021-07-24 ENCOUNTER — Encounter: Payer: Self-pay | Admitting: Nurse Practitioner

## 2021-08-04 ENCOUNTER — Other Ambulatory Visit: Payer: Self-pay | Admitting: Orthopedic Surgery

## 2021-08-04 DIAGNOSIS — M159 Polyosteoarthritis, unspecified: Secondary | ICD-10-CM

## 2021-08-04 MED ORDER — TRAMADOL HCL 50 MG PO TABS: 25.0000 mg | ORAL_TABLET | Freq: Two times a day (BID) | ORAL | 0 refills | Status: DC

## 2021-08-13 ENCOUNTER — Non-Acute Institutional Stay (SKILLED_NURSING_FACILITY): Payer: Medicare Other | Admitting: Nurse Practitioner

## 2021-08-13 ENCOUNTER — Encounter: Payer: Self-pay | Admitting: Nurse Practitioner

## 2021-08-13 DIAGNOSIS — F0150 Vascular dementia without behavioral disturbance: Secondary | ICD-10-CM

## 2021-08-13 DIAGNOSIS — I10 Essential (primary) hypertension: Secondary | ICD-10-CM | POA: Diagnosis not present

## 2021-08-13 DIAGNOSIS — F419 Anxiety disorder, unspecified: Secondary | ICD-10-CM | POA: Diagnosis not present

## 2021-08-13 DIAGNOSIS — G309 Alzheimer's disease, unspecified: Secondary | ICD-10-CM

## 2021-08-13 DIAGNOSIS — F32A Depression, unspecified: Secondary | ICD-10-CM

## 2021-08-13 DIAGNOSIS — R609 Edema, unspecified: Secondary | ICD-10-CM

## 2021-08-13 DIAGNOSIS — E039 Hypothyroidism, unspecified: Secondary | ICD-10-CM

## 2021-08-13 DIAGNOSIS — K5901 Slow transit constipation: Secondary | ICD-10-CM

## 2021-08-13 DIAGNOSIS — M159 Polyosteoarthritis, unspecified: Secondary | ICD-10-CM

## 2021-08-13 DIAGNOSIS — F0280 Dementia in other diseases classified elsewhere without behavioral disturbance: Secondary | ICD-10-CM

## 2021-08-13 DIAGNOSIS — N816 Rectocele: Secondary | ICD-10-CM

## 2021-08-13 NOTE — Assessment & Plan Note (Signed)
Avoid constipation 

## 2021-08-13 NOTE — Assessment & Plan Note (Signed)
impulsive, no safety awareness, off  Memantine, resides in SNF [facility], under Hospice service.

## 2021-08-13 NOTE — Assessment & Plan Note (Signed)
Her mood is managed, occasional emotional/behavioral outbursts, continue  Risperdal,  Depakote, Duloxetine, Clonazepam prn,  TSH 2.32 11/13/20

## 2021-08-13 NOTE — Assessment & Plan Note (Signed)
Stable, continue MiraLax.  °

## 2021-08-13 NOTE — Assessment & Plan Note (Signed)
Blood pressure is controlled,  takes Furosemide, ASA  Bun/creat 26/1.1 06/25/21

## 2021-08-13 NOTE — Assessment & Plan Note (Signed)
dependent, better, on Furosemide, Bun/creat 28/1.1 06/25/21

## 2021-08-13 NOTE — Progress Notes (Signed)
Location:   [facility] Room Number: 49 Place of Service:  SNF (31) Provider:  Marlana Latus, NP  [name], MD  Patient Care Team: [name], MD as PCP - General (Internal Medicine) Jonavan Vanhorn X, NP as Nurse Practitioner (Internal Medicine) [name], MD (Internal Medicine)  Extended Emergency Contact Information Primary Emergency Contact: [facility] Address: [address]          [place], [place] [address] [name] of [place] Phone: [phone] Relation: Son Secondary Emergency Contact: [facility] Address: [address] [place] [address] [name] Phone: [phone] Relation: Daughter  Code Status:  DNR Goals of care: Advanced Directive information Advanced Directives 08/13/2021  Does Patient Have a Medical Advance Directive? Yes  Type of Paramedic of [place];Out of facility DNR (pink MOST or yellow form)  Does patient want to make changes to medical advance directive? No - Patient declined  Copy of [place] in Chart? Yes - validated most recent copy scanned in chart (See row information)  Would patient like information on creating a medical advance directive? -  Pre-existing out of facility DNR order (yellow form or pink MOST form) Pink MOST form placed in chart (order not valid for inpatient use);Yellow form placed in chart (order not valid for inpatient use)     Chief Complaint  Patient presents with   Medical Management of Chronic Issues    Routine follow up visit.   Health Maintenance    Zoster vaccine, 3rd COVID booster    HPI:  Pt is a 88 y.o. female seen today for medical management of chronic diseases.    Tested positive COVID in the past, no weight loss, under Hospice service.             Pressure ulcers R+L heels, healed, scars.              Edema, dependent, better, on Furosemide, Bun/creat 28/1.1 06/25/21              Constipation, takes MiraLax.             Rectocele avoid constipation             Dementia, impulsive, no safety awareness, off  Memantine, resides in [facility], under Hospice service.              Depression/anxiety, takes Risperdal,  Depakote, Duloxetine, Clonazepam prn,  TSH 2.32 11/13/20             HTN, takes Furosemide, ASA  Bun/creat 26/1.1 06/25/21             Hypothyroidism, takes Levothyroxine. TSH 2.32 11/13/20             OA,  Multiple sites, managed with Tylenol, Tramadol.  Past Medical History:  Diagnosis Date   Alzheimer disease ([place]) 10/27/2016   Arthritis    Confusion 04/04/2019   Depression, recurrent ([place]) 10/27/2016   History of fracture of right hip 04/04/2019   07/01/19 Ortho, R hip incision healed, X-ray looks good, s/p IMN R hip fx, WBAT, f/u 6 mos.    HTN (hypertension) 10/27/2016   Hypertension    Hypertensive kidney disease with CKD (chronic kidney disease) stage V ([place]) 11/02/2016   11/02/16 Na 136, K 4.7, Bun 30, creat 1.52, TSH 0.86, wbc 6.7, Hgb 11.3, plt 264   Hypothyroidism 10/27/2016   Osteoarthritis 10/27/2016  Thyroid disease    Past Surgical History:  Procedure Laterality Date   ABDOMINAL HYSTERECTOMY  1986   INTRAMEDULLARY (IM) NAIL INTERTROCHANTERIC Right 04/04/2019   Procedure: INTRAMEDULLARY (IM) NAIL INTERTROCHANTRIC;  Surgeon: Rod Can, MD;  [facility]: [facility];  Service: Orthopedics;  Laterality: Right;   KNEE ARTHROSCOPY  2015/2017   Dr. Gustavus Bryant   SPINE SURGERY  2009, 2011   Dr.Mark Nicoletta Dress   TIBIA IM NAIL INSERTION Right 01/01/2020   Procedure: INTRAMEDULLARY (IM) NAIL TIBIAL;  Surgeon: Shona Needles, MD;  Location: [facility];  Service: Orthopedics;  Laterality: Right;    No Known Allergies  Allergies as of 08/13/2021   No Known Allergies      Medication List        Accurate as of August 13, 2021 11:59 PM. If you have any questions, ask your nurse or doctor.          acetaminophen 325 MG tablet Commonly known as: TYLENOL Take 650 mg  by mouth 2 (two) times daily.   clonazePAM 0.5 MG tablet Commonly known as: KLONOPIN Take 0.5 mg by mouth every 6 (six) hours as needed for anxiety.   divalproex 125 MG DR tablet Commonly known as: DEPAKOTE Take 250 mg by mouth at bedtime.   DULoxetine 30 MG capsule Commonly known as: CYMBALTA Take 30 mg by mouth daily.   furosemide 20 MG tablet Commonly known as: LASIX Take 20 mg by mouth every evening.   furosemide 40 MG tablet Commonly known as: LASIX Take 40 mg by mouth every morning.   levothyroxine 25 MCG tablet Commonly known as: SYNTHROID Take 25 mcg by mouth daily before breakfast.   ondansetron 4 MG/5ML solution Commonly known as: ZOFRAN Take 4 mg by mouth daily as needed for nausea or vomiting.   polyethylene glycol 17 g packet Commonly known as: MIRALAX / GLYCOLAX Take 17 g by mouth daily.   potassium chloride 10 MEQ CR capsule Commonly known as: MICRO-K Take 10 mEq by mouth daily.   risperiDONE 1 MG tablet Commonly known as: RISPERDAL Take 1 mg by mouth at bedtime.   traMADol 50 MG tablet Commonly known as: ULTRAM Take 0.5 tablets (25 mg total) by mouth every 6 (six) hours as needed.   traMADol 50 MG tablet Commonly known as: ULTRAM Take 0.5 tablets (25 mg total) by mouth 2 (two) times daily.        Review of Systems  Unable to perform ROS: Dementia   Immunization History  Administered Date(s) Administered   Influenza-Unspecified 05/29/2017, 05/20/2020, 05/26/2021   Moderna SARS-COV2 Booster Vaccination 01/05/2021   Moderna Sars-Covid-2 Vaccination 09/07/2019, 10/05/2019, 06/16/2020   PFIZER(Purple Top)SARS-COV-2 Vaccination 04/27/2021   Pneumococcal Conjugate-13 06/21/2017   Pneumococcal Polysaccharide-23 07/16/2018   Tdap 06/21/2017   Pertinent  Health Maintenance Due  Topic Date Due   DEXA SCAN  Completed   INFLUENZA VACCINE  Discontinued   Fall Risk 01/10/2020 01/11/2020 01/11/2020 01/12/2020 02/01/2021  Falls in the past year? - - - -  1  Was there an injury with Fall? - - - - 1  Fall Risk Category Calculator - - - - 2  Fall Risk Category - - - - Moderate  Patient Fall Risk Level High fall risk High fall risk High fall risk High fall risk High fall risk  Patient at Risk for Falls Due to - - - - History of fall(s)  Fall risk Follow up - - - - Falls evaluation completed   Functional Status Survey:  Vitals:   08/13/21 1125  BP: 120/74  Pulse: 68  Resp: 18  Temp: 98 F (36.7 C)  SpO2: 94%  Weight: 142 lb 11.2 oz (64.7 kg)  Height: 4\' 11"  (1.499 m)   Body mass index is 28.82 kg/m. Physical Exam Vitals and nursing note reviewed.  Constitutional:      Appearance: Normal appearance.  HENT:     Head: Normocephalic and atraumatic.     Mouth/Throat:     Mouth: Mucous membranes are moist.  Eyes:     Extraocular Movements: Extraocular movements intact.     Conjunctiva/sclera: Conjunctivae normal.     Pupils: Pupils are equal, round, and reactive to light.  Cardiovascular:     Rate and Rhythm: Normal rate and regular rhythm.  Pulmonary:     Effort: Pulmonary effort is normal.     Breath sounds: No rales.  Abdominal:     General: Bowel sounds are normal.     Palpations: Abdomen is soft.     Tenderness: There is no abdominal tenderness.  Musculoskeletal:     Right lower leg: Edema present.     Left lower leg: Edema present.     Comments: Edema trace BLE, dependent, trace to 1+  Skin:    General: Skin is warm and dry.     Comments: R+L heel pressure ulcer scars.   Neurological:     General: No focal deficit present.     Mental Status: She is alert.     Gait: Gait abnormal.  Psychiatric:        Mood and Affect: Mood normal.     Comments: Followed simple directions during today's examination, but confused.     Labs reviewed: Recent Labs    11/13/20 1325 03/16/21 0000 06/25/21 0000  NA 139 140 144  K 4.5 4.1 3.8  CL 103 106 105  CO2 27* 27* 28*  BUN 20 16 26*  CREATININE 1.0 0.9 1.1  CALCIUM  10.0 9.6 9.7   Recent Labs    11/13/20 1325  AST 9*  ALT 7  ALKPHOS 70  ALBUMIN 3.6   Recent Labs    11/13/20 1325  WBC 8.2  HGB 11.0*  HCT 34*  PLT 288   Lab Results  Component Value Date   TSH 2.32 11/13/2020   Lab Results  Component Value Date   HGBA1C 5.3 11/01/2016   No results found for: CHOL, HDL, LDLCALC, LDLDIRECT, TRIG, CHOLHDL  Significant Diagnostic Results in last 30 days:  No results found.  Assessment/Plan Hypothyroidism  takes Levothyroxine. TSH 2.32 11/13/20  Generalized osteoarthritis of multiple sites Multiple sites, managed with Tylenol, Tramadol.   Essential hypertension Blood pressure is controlled,  takes Furosemide, ASA  Bun/creat 26/1.1 06/25/21  Anxiety and depression Her mood is managed, occasional emotional/behavioral outbursts, continue  Risperdal,  Depakote, Duloxetine, Clonazepam prn,  TSH 2.32 11/13/20  Mixed Alzheimer's and vascular dementia ([place])  impulsive, no safety awareness, off  Memantine, resides in [facility], under Hospice service.   Rectocele Avoid constipation  Slow transit constipation Stable, continue MiraLax.   Dependent edema dependent, better, on Furosemide, Bun/creat 28/1.1 06/25/21    Family/ staff Communication: plan of care reviewed with the patient and charge nurse.   Labs/tests ordered:  none  Time spend 25 minutes.

## 2021-08-13 NOTE — Assessment & Plan Note (Signed)
Multiple sites, managed with Tylenol, Tramadol.  

## 2021-08-13 NOTE — Assessment & Plan Note (Signed)
takes Levothyroxine. TSH 2.32 11/13/20

## 2021-08-16 ENCOUNTER — Encounter: Payer: Self-pay | Admitting: Nurse Practitioner

## 2021-09-27 ENCOUNTER — Non-Acute Institutional Stay (SKILLED_NURSING_FACILITY): Payer: Medicare Other | Admitting: Nurse Practitioner

## 2021-09-27 DIAGNOSIS — E039 Hypothyroidism, unspecified: Secondary | ICD-10-CM

## 2021-09-27 DIAGNOSIS — F0150 Vascular dementia without behavioral disturbance: Secondary | ICD-10-CM

## 2021-09-27 DIAGNOSIS — N816 Rectocele: Secondary | ICD-10-CM

## 2021-09-27 DIAGNOSIS — I10 Essential (primary) hypertension: Secondary | ICD-10-CM

## 2021-09-27 DIAGNOSIS — M159 Polyosteoarthritis, unspecified: Secondary | ICD-10-CM | POA: Diagnosis not present

## 2021-09-27 DIAGNOSIS — R609 Edema, unspecified: Secondary | ICD-10-CM

## 2021-09-27 DIAGNOSIS — G309 Alzheimer's disease, unspecified: Secondary | ICD-10-CM

## 2021-09-27 DIAGNOSIS — F32A Depression, unspecified: Secondary | ICD-10-CM

## 2021-09-27 DIAGNOSIS — K5901 Slow transit constipation: Secondary | ICD-10-CM

## 2021-09-27 DIAGNOSIS — F419 Anxiety disorder, unspecified: Secondary | ICD-10-CM

## 2021-09-27 DIAGNOSIS — F0280 Dementia in other diseases classified elsewhere without behavioral disturbance: Secondary | ICD-10-CM

## 2021-09-28 ENCOUNTER — Encounter: Payer: Self-pay | Admitting: Nurse Practitioner

## 2021-09-28 NOTE — Assessment & Plan Note (Signed)
managed with Tylenol, Tramadol.

## 2021-09-28 NOTE — Assessment & Plan Note (Signed)
Better mood controlled,  takes Risperdal,  Depakote, Duloxetine, Clonazepam prn,  TSH 2.32 11/13/20

## 2021-09-28 NOTE — Assessment & Plan Note (Signed)
Blood pressure is controlled, takes Furosemide, ASA  Bun/creat 26/1.1 06/25/21

## 2021-09-28 NOTE — Assessment & Plan Note (Signed)
Less impulsive, no safety awareness, off  Memantine, resides in [facility], under Hospice service.

## 2021-09-28 NOTE — Assessment & Plan Note (Signed)
Stable, takes MiraLax  

## 2021-09-28 NOTE — Assessment & Plan Note (Signed)
avoid constipation 

## 2021-09-28 NOTE — Progress Notes (Signed)
Location:   [facility] [place] Room Number: 76 Place of Service:  SNF (31) Provider: [facility] Henley Boettner NP  [name], MD  Patient Care Team: [name], MD as PCP - General (Internal Medicine) Gayle Collard X, NP as Nurse Practitioner (Internal Medicine) [name], MD (Internal Medicine)  Extended Emergency Contact Information Primary Emergency Contact: [facility] Address: [address]          [place], [place] [address] [name] of [place] Phone: [phone] Relation: Son Secondary Emergency Contact: [facility] Address: [address] [place] [address] [name] Phone: [phone] Relation: Daughter  Code Status:  DNR Goals of care: Advanced Directive information Advanced Directives 09/28/2021  Does Patient Have a Medical Advance Directive? Yes  Type of Paramedic of [place];Out of facility DNR (pink MOST or yellow form)  Does patient want to make changes to medical advance directive? No - Patient declined  Copy of [place] in Chart? Yes - validated most recent copy scanned in chart (See row information)  Would patient like information on creating a medical advance directive? -  Pre-existing out of facility DNR order (yellow form or pink MOST form) Pink MOST form placed in chart (order not valid for inpatient use);Yellow form placed in chart (order not valid for inpatient use)     Chief Complaint  Patient presents with   Medical Management of Chronic Issues   Quality Metric Gaps    Shingrix NCIR/Matrix verified    HPI:  Pt is a 88 y.o. female seen today for medical management of chronic diseases.     Tested positive COVID in the past, no weight loss, under Hospice service.             Pressure ulcers R+L heels, healed, scars.              Edema, dependent, better, on Furosemide, Bun/creat 28/1.1 06/25/21             Constipation, takes MiraLax.             Rectocele  avoid constipation             Dementia, less impulsive, no safety awareness, off  [place], resides in [facility], under Hospice service.              Depression/anxiety, takes Risperdal,  Depakote, Duloxetine, Clonazepam prn,  TSH 2.32 11/13/20             HTN, takes Furosemide, ASA  Bun/creat 26/1.1 06/25/21             Hypothyroidism, takes Levothyroxine. TSH 2.32 11/13/20             OA,  Multiple sites, managed with Tylenol, Tramadol.   Past Medical History:  Diagnosis Date   Alzheimer disease ([place]) 10/27/2016   Arthritis    Confusion 04/04/2019   Depression, recurrent ([place]) 10/27/2016   History of fracture of right hip 04/04/2019   07/01/19 Ortho, R hip incision healed, X-ray looks good, s/p IMN R hip fx, WBAT, f/u 6 mos.    HTN (hypertension) 10/27/2016   Hypertension    Hypertensive kidney disease with CKD (chronic kidney disease) stage V ([place]) 11/02/2016   11/02/16 Na 136, K 4.7, Bun 30, creat 1.52, TSH 0.86, wbc 6.7, Hgb 11.3, plt 264   Hypothyroidism 10/27/2016   Osteoarthritis 10/27/2016   Thyroid disease    Past  Surgical History:  Procedure Laterality Date   ABDOMINAL HYSTERECTOMY  1986   INTRAMEDULLARY (IM) NAIL INTERTROCHANTERIC Right 04/04/2019   Procedure: INTRAMEDULLARY (IM) NAIL INTERTROCHANTRIC;  Surgeon: Rod Can, MD;  Location: [facility];  Service: Orthopedics;  Laterality: Right;   KNEE ARTHROSCOPY  2015/2017   Dr. Gustavus Bryant   SPINE SURGERY  2009, 2011   Dr.Mark Nicoletta Dress   TIBIA IM NAIL INSERTION Right 01/01/2020   Procedure: INTRAMEDULLARY (IM) NAIL TIBIAL;  Surgeon: Shona Needles, MD;  Location: [place];  Service: Orthopedics;  Laterality: Right;    No Known Allergies  Allergies as of 09/27/2021   No Known Allergies      Medication List        Accurate as of September 27, 2021 11:59 PM. If you have any questions, ask your nurse or doctor.          acetaminophen 325 MG tablet Commonly known as: TYLENOL Take 650 mg by mouth 2 (two) times daily.    clonazePAM 0.5 MG tablet Commonly known as: KLONOPIN Take 0.5 mg by mouth every 6 (six) hours as needed for anxiety.   divalproex 125 MG DR tablet Commonly known as: DEPAKOTE Take 250 mg by mouth at bedtime.   DULoxetine 30 MG capsule Commonly known as: CYMBALTA Take 30 mg by mouth daily.   furosemide 20 MG tablet Commonly known as: LASIX Take 20 mg by mouth every evening.   furosemide 40 MG tablet Commonly known as: LASIX Take 40 mg by mouth every morning.   levothyroxine 25 MCG tablet Commonly known as: SYNTHROID Take 25 mcg by mouth daily before breakfast.   ondansetron 4 MG/5ML solution Commonly known as: ZOFRAN Take 4 mg by mouth daily as needed for nausea or vomiting.   polyethylene glycol 17 g packet Commonly known as: MIRALAX / GLYCOLAX Take 17 g by mouth daily.   potassium chloride 10 MEQ CR capsule Commonly known as: MICRO-K Take 10 mEq by mouth daily.   risperiDONE 1 MG tablet Commonly known as: RISPERDAL Take 1 mg by mouth at bedtime.   traMADol 50 MG tablet Commonly known as: ULTRAM Take 0.5 tablets (25 mg total) by mouth every 6 (six) hours as needed.   traMADol 50 MG tablet Commonly known as: ULTRAM Take 0.5 tablets (25 mg total) by mouth 2 (two) times daily.        Review of Systems  Unable to perform ROS: Dementia   Immunization History  Administered Date(s) Administered   Influenza-Unspecified 05/29/2017, 05/20/2020, 05/26/2021   Moderna SARS-COV2 Booster Vaccination 01/05/2021   Moderna Sars-Covid-2 Vaccination 08/27/2019, 09/07/2019, 10/05/2019, 06/16/2020   PFIZER(Purple Top)SARS-COV-2 Vaccination 04/27/2021   Pneumococcal Conjugate-13 06/21/2017   Pneumococcal Polysaccharide-23 07/16/2018   Tdap 06/21/2017   Pertinent  Health Maintenance Due  Topic Date Due   DEXA SCAN  Completed   INFLUENZA VACCINE  Discontinued   Fall Risk 01/10/2020 01/11/2020 01/11/2020 01/12/2020 02/01/2021  Falls in the past year? - - - - 1  Was there an  injury with Fall? - - - - 1  Fall Risk Category Calculator - - - - 2  Fall Risk Category - - - - Moderate  Patient Fall Risk Level High fall risk High fall risk High fall risk High fall risk High fall risk  Patient at Risk for Falls Due to - - - - History of fall(s)  Fall risk Follow up - - - - Falls evaluation completed   Functional Status Survey:    Vitals:   09/28/21  1319  BP: 120/66  Pulse: 76  Resp: 20  Temp: 98.8 F (37.1 C)  SpO2: 94%  Weight: 143 lb 9.6 oz (65.1 kg)  Height: 4\' 11"  (1.499 m)   Body mass index is 29 kg/m. Physical Exam Vitals and nursing note reviewed.  Constitutional:      Appearance: Normal appearance.  HENT:     Head: Normocephalic and atraumatic.     Mouth/Throat:     Mouth: Mucous membranes are moist.  Eyes:     Extraocular Movements: Extraocular movements intact.     Conjunctiva/sclera: Conjunctivae normal.     Pupils: Pupils are equal, round, and reactive to light.  Cardiovascular:     Rate and Rhythm: Normal rate and regular rhythm.  Pulmonary:     Effort: Pulmonary effort is normal.     Breath sounds: No rales.  Abdominal:     General: Bowel sounds are normal.     Palpations: Abdomen is soft.     Tenderness: There is no abdominal tenderness.  Musculoskeletal:     Right lower leg: Edema present.     Left lower leg: Edema present.     Comments: Edema trace BLE, dependent, trace to 1+  Skin:    General: Skin is warm and dry.     Comments: R+L heel pressure ulcer scars.   Neurological:     General: No focal deficit present.     Mental Status: She is alert.     Gait: Gait abnormal.  Psychiatric:        Mood and Affect: Mood normal.     Comments: Followed simple directions during today's examination, but confused.     Labs reviewed: Recent Labs    11/13/20 1325 03/16/21 0000 06/25/21 0000  NA 139 140 144  K 4.5 4.1 3.8  CL 103 106 105  CO2 27* 27* 28*  BUN 20 16 26*  CREATININE 1.0 0.9 1.1  CALCIUM 10.0 9.6 9.7    Recent Labs    11/13/20 1325  AST 9*  ALT 7  ALKPHOS 70  ALBUMIN 3.6   Recent Labs    11/13/20 1325  WBC 8.2  HGB 11.0*  HCT 34*  PLT 288   Lab Results  Component Value Date   TSH 2.32 11/13/2020   Lab Results  Component Value Date   HGBA1C 5.3 11/01/2016   No results found for: CHOL, HDL, LDLCALC, LDLDIRECT, TRIG, CHOLHDL  Significant Diagnostic Results in last 30 days:  No results found.  Assessment/Plan  Generalized osteoarthritis of multiple sites managed with Tylenol, Tramadol.  Hypothyroidism takes Levothyroxine. TSH 2.32 11/13/20  Essential hypertension Blood pressure is controlled, takes Furosemide, ASA  Bun/creat 26/1.1 06/25/21  Anxiety and depression Better mood controlled,  takes Risperdal,  Depakote, Duloxetine, Clonazepam prn,  TSH 2.32 11/13/20  Mixed Alzheimer's and vascular dementia ([place]) Less impulsive, no safety awareness, off  [place], resides in [facility], under Hospice service.   Rectocele avoid constipation  Slow transit constipation Stable, takes MiraLax.  Dependent edema dependent, better, on Furosemide, Bun/creat 28/1.1 06/25/21   Family/ staff Communication: plan of care reviewed with the patient and charge nurse.   Labs/tests ordered: none  Time spend 25 minutes.

## 2021-09-28 NOTE — Assessment & Plan Note (Signed)
takes Levothyroxine. TSH 2.32 11/13/20

## 2021-09-28 NOTE — Assessment & Plan Note (Signed)
dependent, better, on Furosemide, Bun/creat 28/1.1 06/25/21

## 2021-09-30 ENCOUNTER — Encounter: Payer: Self-pay | Admitting: Nurse Practitioner

## 2021-10-06 ENCOUNTER — Other Ambulatory Visit: Payer: Self-pay | Admitting: Orthopedic Surgery

## 2021-10-06 DIAGNOSIS — M159 Polyosteoarthritis, unspecified: Secondary | ICD-10-CM

## 2021-10-06 MED ORDER — TRAMADOL HCL 50 MG PO TABS: 25.0000 mg | ORAL_TABLET | Freq: Two times a day (BID) | ORAL | 0 refills | Status: DC

## 2021-10-12 ENCOUNTER — Non-Acute Institutional Stay (SKILLED_NURSING_FACILITY): Admitting: Internal Medicine

## 2021-10-12 ENCOUNTER — Encounter: Payer: Self-pay | Admitting: Internal Medicine

## 2021-10-12 DIAGNOSIS — I10 Essential (primary) hypertension: Secondary | ICD-10-CM | POA: Diagnosis not present

## 2021-10-12 DIAGNOSIS — F0150 Vascular dementia without behavioral disturbance: Secondary | ICD-10-CM

## 2021-10-12 DIAGNOSIS — G309 Alzheimer's disease, unspecified: Secondary | ICD-10-CM | POA: Diagnosis not present

## 2021-10-12 DIAGNOSIS — F419 Anxiety disorder, unspecified: Secondary | ICD-10-CM | POA: Diagnosis not present

## 2021-10-12 DIAGNOSIS — E039 Hypothyroidism, unspecified: Secondary | ICD-10-CM

## 2021-10-12 DIAGNOSIS — F32A Depression, unspecified: Secondary | ICD-10-CM

## 2021-10-12 DIAGNOSIS — F0280 Dementia in other diseases classified elsewhere without behavioral disturbance: Secondary | ICD-10-CM

## 2021-10-12 DIAGNOSIS — R600 Localized edema: Secondary | ICD-10-CM

## 2021-10-12 NOTE — Progress Notes (Signed)
Location:   [place] Room Number: 42 Place of Service:  SNF [phone]) Provider:  Veleta Miners MD   [name], MD  Patient Care Team: [name], MD as PCP - General (Internal Medicine) Mast, Man X, NP as Nurse Practitioner (Internal Medicine) [name], MD (Internal Medicine)  Extended Emergency Contact Information Primary Emergency Contact: [facility] Address: [address]          [place], [place] [address] [name] of [place] Phone: [phone] Relation: Son Secondary Emergency Contact: [facility] Address: [address] [place] [address] [name] Phone: [phone] Relation: Daughter  Code Status:  DNR Hospice Goals of care: Advanced Directive information Advanced Directives 10/12/2021  Does Patient Have a Medical Advance Directive? Yes  Type of Paramedic of [place];Out of facility DNR (pink MOST or yellow form)  Does patient want to make changes to medical advance directive? No - Patient declined  Copy of [place] in Chart? Yes - validated most recent copy scanned in chart (See row information)  Would patient like information on creating a medical advance directive? -  Pre-existing out of facility DNR order (yellow form or pink MOST form) Pink MOST form placed in chart (order not valid for inpatient use);Yellow form placed in chart (order not valid for inpatient use)     Chief Complaint  Patient presents with   Medical Management of Chronic Issues   Quality Metric Gaps    shingrix    HPI:  Pt is a 88 y.o. female seen today for medical management of chronic diseases.    Patient is long term care resident Patient  has h/o Hypertension,  Hypothyroidism Alzheimer's Dementia with behavior issues, Depression, B12 def, S/P Right Hip fracture with Intramedullary Fixation in 8/20 H/O Colitis in 06/21 and Right Tibia Fibula Fracture in 5/21  due to fall S/P IM Placement  History of progressive dementia Enrolled in hospice. Continues to be stable Wheelchair-bound Wt Readings from Last 3 Encounters:  10/12/21 143 lb 6.4 oz (65 kg)  09/28/21 143 lb 9.6 oz (65.1 kg)  08/13/21 142 lb 11.2 oz (64.7 kg)  Weight stable   Past Medical History:  Diagnosis Date   Alzheimer disease ([place]) 10/27/2016   Arthritis    Confusion 04/04/2019   Depression, recurrent ([place]) 10/27/2016   History of fracture of right hip 04/04/2019   07/01/19 Ortho, R hip incision healed, X-ray looks good, s/p IMN R hip fx, WBAT, f/u 6 mos.    HTN (hypertension) 10/27/2016   Hypertension    Hypertensive kidney disease with CKD (chronic kidney disease) stage V ([place]) 11/02/2016   11/02/16 Na 136, K 4.7, Bun 30, creat 1.52, TSH 0.86, wbc 6.7, Hgb 11.3, plt 264   Hypothyroidism 10/27/2016   Osteoarthritis 10/27/2016   Thyroid disease    Past Surgical History:  Procedure Laterality Date   ABDOMINAL HYSTERECTOMY  1986   INTRAMEDULLARY (IM) NAIL INTERTROCHANTERIC Right 04/04/2019   Procedure: INTRAMEDULLARY (IM) NAIL INTERTROCHANTRIC;  Surgeon: Rod Can, MD;  [facility];  Service: Orthopedics;  Laterality: Right;   KNEE ARTHROSCOPY  2015/2017   Dr. Gustavus Bryant   SPINE SURGERY  2009, 2011   Dr.Mark Nicoletta Dress   TIBIA IM NAIL INSERTION Right 01/01/2020   Procedure: INTRAMEDULLARY (IM) NAIL TIBIAL;  Surgeon: Shona Needles, MD;  Location: [place];  Service: Orthopedics;  Laterality: Right;  No Known Allergies  Allergies as of 10/12/2021   No Known Allergies      Medication List        Accurate as of October 12, 2021  3:14 PM. If you have any questions, ask your nurse or doctor.          acetaminophen 325 MG tablet Commonly known as: TYLENOL Take 650 mg by mouth 2 (two) times daily.   clonazePAM 0.5 MG tablet Commonly known as: KLONOPIN Take 0.5 mg by mouth every 6 (six) hours as needed for anxiety.   divalproex 125 MG DR tablet Commonly known as:  DEPAKOTE Take 250 mg by mouth at bedtime.   DULoxetine 30 MG capsule Commonly known as: CYMBALTA Take 30 mg by mouth daily.   furosemide 20 MG tablet Commonly known as: LASIX Take 20 mg by mouth every evening.   furosemide 40 MG tablet Commonly known as: LASIX Take 40 mg by mouth every morning.   levothyroxine 25 MCG tablet Commonly known as: SYNTHROID Take 25 mcg by mouth daily before breakfast.   ondansetron 4 MG/5ML solution Commonly known as: ZOFRAN Take 4 mg by mouth daily as needed for nausea or vomiting.   polyethylene glycol 17 g packet Commonly known as: MIRALAX / GLYCOLAX Take 17 g by mouth daily.   potassium chloride 10 MEQ CR capsule Commonly known as: MICRO-K Take 10 mEq by mouth daily.   risperiDONE 1 MG tablet Commonly known as: RISPERDAL Take 1 mg by mouth at bedtime.   traMADol 50 MG tablet Commonly known as: ULTRAM Take 0.5 tablets (25 mg total) by mouth every 6 (six) hours as needed.   traMADol 50 MG tablet Commonly known as: ULTRAM Take 0.5 tablets (25 mg total) by mouth 2 (two) times daily.        Review of Systems  Unable to perform ROS: Dementia   Immunization History  Administered Date(s) Administered   Influenza-Unspecified 05/29/2017, 05/20/2020, 05/26/2021   Moderna SARS-COV2 Booster Vaccination 01/05/2021   Moderna Sars-Covid-2 Vaccination 08/27/2019, 09/07/2019, 10/05/2019, 06/16/2020   PFIZER(Purple Top)SARS-COV-2 Vaccination 04/27/2021   Pneumococcal Conjugate-13 06/21/2017   Pneumococcal Polysaccharide-23 07/16/2018   Tdap 06/21/2017   Pertinent  Health Maintenance Due  Topic Date Due   DEXA SCAN  Completed   INFLUENZA VACCINE  Discontinued   Fall Risk 01/10/2020 01/11/2020 01/11/2020 01/12/2020 02/01/2021  Falls in the past year? - - - - 1  Was there an injury with Fall? - - - - 1  Fall Risk Category Calculator - - - - 2  Fall Risk Category - - - - Moderate  Patient Fall Risk Level High fall risk High fall risk High fall  risk High fall risk High fall risk  Patient at Risk for Falls Due to - - - - History of fall(s)  Fall risk Follow up - - - - Falls evaluation completed   Functional Status Survey:    Vitals:   10/12/21 1508  BP: (!) 164/92  Pulse: 75  Resp: 19  Temp: 97.6 F (36.4 C)  SpO2: 97%  Weight: 143 lb 6.4 oz (65 kg)  Height: '4\' 11"'$  (1.499 m)   Body mass index is 28.96 kg/m. Physical Exam Vitals reviewed.  Constitutional:      Appearance: Normal appearance.  HENT:     Head: Normocephalic.     Nose: Nose normal.     Mouth/Throat:     Mouth: Mucous membranes are moist.     Pharynx: Oropharynx is clear.  Eyes:  Pupils: Pupils are equal, round, and reactive to light.  Cardiovascular:     Rate and Rhythm: Normal rate and regular rhythm.     Pulses: Normal pulses.     Heart sounds: Normal heart sounds. No murmur heard. Pulmonary:     Effort: Pulmonary effort is normal.     Breath sounds: Normal breath sounds.  Abdominal:     General: Abdomen is flat. Bowel sounds are normal.     Palpations: Abdomen is soft.  Musculoskeletal:        General: Swelling present.     Cervical back: Neck supple.  Skin:    General: Skin is warm.  Neurological:     General: No focal deficit present.     Mental Status: She is alert.     Comments: Responds but then get confused  Psychiatric:        Mood and Affect: Mood normal.        Thought Content: Thought content normal.    Labs reviewed: Recent Labs    11/13/20 1325 03/16/21 0000 06/25/21 0000  NA 139 140 144  K 4.5 4.1 3.8  CL 103 106 105  CO2 27* 27* 28*  BUN 20 16 26*  CREATININE 1.0 0.9 1.1  CALCIUM 10.0 9.6 9.7   Recent Labs    11/13/20 1325  AST 9*  ALT 7  ALKPHOS 70  ALBUMIN 3.6   Recent Labs    11/13/20 1325  WBC 8.2  HGB 11.0*  HCT 34*  PLT 288   Lab Results  Component Value Date   TSH 2.32 11/13/2020   Lab Results  Component Value Date   HGBA1C 5.3 11/01/2016   No results found for: CHOL, HDL,  LDLCALC, LDLDIRECT, TRIG, CHOLHDL  Significant Diagnostic Results in last 30 days:  No results found.  Assessment/Plan 1. Mixed Alzheimer's and vascular dementia ([place]) Take Depakote and Risperdal for behaviors  2. Anxiety and depression On Cymbalta Weight stable  3. Essential hypertension Mildly high today Will continue to monitor  4. Acquired hypothyroidism TSH normal in 4/22  5. Bilateral leg edema Doing well with Lasix  BUN and Creat stable 6 Arthritis On Tramadol    Family/ staff Communication:   Labs/tests ordered:

## 2021-11-24 ENCOUNTER — Non-Acute Institutional Stay (SKILLED_NURSING_FACILITY): Payer: Medicare Other | Admitting: Nurse Practitioner

## 2021-11-24 DIAGNOSIS — I10 Essential (primary) hypertension: Secondary | ICD-10-CM | POA: Diagnosis not present

## 2021-11-24 DIAGNOSIS — G309 Alzheimer's disease, unspecified: Secondary | ICD-10-CM | POA: Diagnosis not present

## 2021-11-24 DIAGNOSIS — K5901 Slow transit constipation: Secondary | ICD-10-CM

## 2021-11-24 DIAGNOSIS — F0150 Vascular dementia without behavioral disturbance: Secondary | ICD-10-CM

## 2021-11-24 DIAGNOSIS — E039 Hypothyroidism, unspecified: Secondary | ICD-10-CM | POA: Diagnosis not present

## 2021-11-24 DIAGNOSIS — F339 Major depressive disorder, recurrent, unspecified: Secondary | ICD-10-CM

## 2021-11-24 DIAGNOSIS — R609 Edema, unspecified: Secondary | ICD-10-CM

## 2021-11-24 DIAGNOSIS — N816 Rectocele: Secondary | ICD-10-CM

## 2021-11-24 DIAGNOSIS — M159 Polyosteoarthritis, unspecified: Secondary | ICD-10-CM

## 2021-11-24 DIAGNOSIS — F0280 Dementia in other diseases classified elsewhere without behavioral disturbance: Secondary | ICD-10-CM

## 2021-11-24 NOTE — Progress Notes (Signed)
?Location:   [facility] ?Nursing Home Room Number: 42A ?Place of Service:  SNF (31) ?Provider: Marlana Latus NP ? ?[name], MD ? ?Patient Care Team: ?[name], MD as PCP - General (Internal Medicine) ?Chaniqua Brisby X, NP as Nurse Practitioner (Internal Medicine) ?[name], MD (Internal Medicine) ? ?Extended Emergency Contact Information ?Primary Emergency Contact: Moyer,Rick ?Address: [address] ?         [place], [place] [address] [place] ?Home Phone: [phone] ?Relation: Son ?Secondary Emergency Contact: Huston,Kathy ?Address: [address] ?         [place] [address] [place] ?Mobile Phone: [phone] ?Relation: Daughter ? ?Code Status:  DNR ?Goals of care: Advanced Directive information ? ?  10/12/2021  ?  3:14 PM  ?Advanced Directives  ?Does Patient Have a Medical Advance Directive? Yes  ?Type of Paramedic of [place];Out of facility DNR (pink MOST or yellow form)  ?Does patient want to make changes to medical advance directive? No - Patient declined  ?Copy of [place] in Chart? Yes - validated most recent copy scanned in chart (See row information)  ?Pre-existing out of facility DNR order (yellow form or pink MOST form) Pink MOST form placed in chart (order not valid for inpatient use);Yellow form placed in chart (order not valid for inpatient use)  ? ? ? ?Chief Complaint  ?Patient presents with  ? Medical Management of Chronic Issues  ? ? ?HPI:  ?Pt is a 88 y.o. female seen today for medical management of chronic diseases.   ?  ? Edema, dependent, better, on Furosemide, Bun/creat 28/1.1 06/25/21 ?            Constipation, takes MiraLax. ?            Rectocele avoid constipation ?            Dementia, less impulsive, no safety awareness, off  [facility], resides in [facility], under Hospice service.  ?            Depression/anxiety, takes Risperdal,  Depakote, Duloxetine, Clonazepam prn,  TSH 2.32 11/13/20 ?             HTN, takes Furosemide, ASA  Bun/creat 26/1.1 06/25/21 ?            Hypothyroidism, takes Levothyroxine. TSH 2.32 11/13/20 ?            OA,  Multiple sites, managed with Tylenol, Tramadol.  ?  ? ?Past Medical History:  ?Diagnosis Date  ? Alzheimer disease ([place]) 10/27/2016  ? Arthritis   ? Confusion 04/04/2019  ? Depression, recurrent ([place]) 10/27/2016  ? History of fracture of right hip 04/04/2019  ? 07/01/19 Ortho, R hip incision healed, X-ray looks good, s/p IMN R hip fx, WBAT, f/u 6 mos.   ? HTN (hypertension) 10/27/2016  ? Hypertension   ? Hypertensive kidney disease with CKD (chronic kidney disease) stage V ([place]) 11/02/2016  ? 11/02/16 Na 136, K 4.7, Bun 30, creat 1.52, TSH 0.86, wbc 6.7, Hgb 11.3, plt 264  ? Hypothyroidism 10/27/2016  ? Osteoarthritis 10/27/2016  ? Thyroid disease   ? ?Past Surgical History:  ?Procedure Laterality Date  ? ABDOMINAL HYSTERECTOMY  1986  ? INTRAMEDULLARY (IM) NAIL INTERTROCHANTERIC Right 04/04/2019  ? Procedure: INTRAMEDULLARY (IM) NAIL INTERTROCHANTRIC;  Surgeon: Rod Can, MD;  Location: [facility];  Service: Orthopedics;  Laterality: Right;  ? KNEE ARTHROSCOPY  2015/2017  ? Dr. Gustavus Bryant  ?  SPINE SURGERY  2009, 2011  ? Dr.Mark Nicoletta Dress  ? TIBIA IM NAIL INSERTION Right 01/01/2020  ? Procedure: INTRAMEDULLARY (IM) NAIL TIBIAL;  Surgeon: Shona Needles, MD;  Location: [facility];  Service: Orthopedics;  Laterality: Right;  ? ? ?No Known Allergies ? ?Allergies as of 11/24/2021   ?No Known Allergies ?  ? ?  ?Medication List  ?  ? ?  ? Accurate as of November 24, 2021 11:59 PM. If you have any questions, ask your nurse or doctor.  ?  ?  ? ?  ? ?acetaminophen 325 MG tablet ?Commonly known as: TYLENOL ?Take 650 mg by mouth 2 (two) times daily. ?  ?clonazePAM 0.5 MG tablet ?Commonly known as: KLONOPIN ?Take 0.5 mg by mouth every 6 (six) hours as needed for anxiety. ?  ?divalproex 125 MG DR tablet ?Commonly known as: DEPAKOTE ?Take 250 mg by mouth at bedtime. ?  ?DULoxetine 30 MG capsule ?Commonly known as:  CYMBALTA ?Take 30 mg by mouth daily. ?  ?furosemide 20 MG tablet ?Commonly known as: LASIX ?Take 20 mg by mouth every evening. ?  ?furosemide 40 MG tablet ?Commonly known as: LASIX ?Take 40 mg by mouth every morning. ?  ?levothyroxine 25 MCG tablet ?Commonly known as: SYNTHROID ?Take 25 mcg by mouth daily before breakfast. ?  ?ondansetron 4 MG/5ML solution ?Commonly known as: ZOFRAN ?Take 4 mg by mouth daily as needed for nausea or vomiting. ?  ?polyethylene glycol 17 g packet ?Commonly known as: MIRALAX / GLYCOLAX ?Take 17 g by mouth daily. ?  ?potassium chloride 10 MEQ CR capsule ?Commonly known as: MICRO-K ?Take 10 mEq by mouth daily. ?  ?risperiDONE 1 MG tablet ?Commonly known as: RISPERDAL ?Take 1 mg by mouth at bedtime. ?  ?traMADol 50 MG tablet ?Commonly known as: ULTRAM ?Take 0.5 tablets (25 mg total) by mouth every 6 (six) hours as needed. ?  ?traMADol 50 MG tablet ?Commonly known as: ULTRAM ?Take 0.5 tablets (25 mg total) by mouth 2 (two) times daily. ?  ? ?  ? ? ?Review of Systems  ?Unable to perform ROS: Dementia  ? ?Immunization History  ?Administered Date(s) Administered  ? Influenza-Unspecified 05/29/2017, 05/20/2020, 05/26/2021  ? Moderna SARS-COV2 Booster Vaccination 01/05/2021  ? Moderna Sars-Covid-2 Vaccination 08/27/2019, 09/07/2019, 10/05/2019, 06/16/2020  ? PFIZER(Purple Top)SARS-COV-2 Vaccination 04/27/2021  ? Pneumococcal Conjugate-13 06/21/2017  ? Pneumococcal Polysaccharide-23 07/16/2018  ? Tdap 06/21/2017  ? ?Pertinent  Health Maintenance Due  ?Topic Date Due  ? DEXA SCAN  Completed  ? INFLUENZA VACCINE  Discontinued  ? ? ?  01/10/2020  ?  9:01 PM 01/11/2020  ?  8:02 AM 01/11/2020  ?  9:00 PM 01/12/2020  ?  7:30 AM 02/01/2021  ?  3:25 PM  ?Fall Risk  ?Falls in the past year?     1  ?Was there an injury with Fall?     1  ?Fall Risk Category Calculator     2  ?Fall Risk Category     Moderate  ?Patient Fall Risk Level High fall risk High fall risk High fall risk High fall risk High fall risk  ?Patient  at Risk for Falls Due to     History of fall(s)  ?Fall risk Follow up     Falls evaluation completed  ? ?Functional Status Survey: ?  ? ?Vitals:  ? 11/24/21 1244  ?BP: (!) (P) 142/78  ?Pulse: (P) 72  ?Resp: 16  ?Temp: 97.7 ?F (36.5 ?C)  ?SpO2: 96%  ? ?There  is no height or weight on file to calculate BMI. ?Physical Exam ?Vitals and nursing note reviewed.  ?Constitutional:   ?   Appearance: Normal appearance.  ?HENT:  ?   Head: Normocephalic and atraumatic.  ?   Mouth/Throat:  ?   Mouth: Mucous membranes are moist.  ?Eyes:  ?   Extraocular Movements: Extraocular movements intact.  ?   Conjunctiva/sclera: Conjunctivae normal.  ?   Pupils: Pupils are equal, round, and reactive to light.  ?Cardiovascular:  ?   Rate and Rhythm: Normal rate and regular rhythm.  ?Pulmonary:  ?   Effort: Pulmonary effort is normal.  ?   Breath sounds: No rales.  ?Abdominal:  ?   General: Bowel sounds are normal.  ?   Palpations: Abdomen is soft.  ?   Tenderness: There is no abdominal tenderness.  ?Musculoskeletal:  ?   Right lower leg: Edema present.  ?   Left lower leg: Edema present.  ?   Comments: Edema trace BLE, dependent, trace to 1+  ?Skin: ?   General: Skin is warm and dry.  ?   Comments: R+L heel pressure ulcer scars.   ?Neurological:  ?   General: No focal deficit present.  ?   Mental Status: She is alert.  ?   Gait: Gait abnormal.  ?Psychiatric:     ?   Mood and Affect: Mood normal.  ?   Comments: Followed simple directions during today's examination, but confused.   ? ? ?Labs reviewed: ?Recent Labs  ?  03/16/21 ?0000 06/25/21 ?0000  ?NA 140 144  ?K 4.1 3.8  ?CL 106 105  ?CO2 27* 28*  ?BUN 16 26*  ?CREATININE 0.9 1.1  ?CALCIUM 9.6 9.7  ? ?No results for input(s): AST, ALT, ALKPHOS, BILITOT, PROT, ALBUMIN in the last 8760 hours. ?No results for input(s): WBC, NEUTROABS, HGB, HCT, MCV, PLT in the last 8760 hours. ?Lab Results  ?Component Value Date  ? TSH 2.32 11/13/2020  ? ?Lab Results  ?Component Value Date  ? HGBA1C 5.3  11/01/2016  ? ?No results found for: CHOL, HDL, LDLCALC, LDLDIRECT, TRIG, CHOLHDL ? ?Significant Diagnostic Results in last 30 days:  ?No results found. ? ?Assessment/Plan ? ?Mixed Alzheimer's and vascular demen

## 2021-11-25 NOTE — Assessment & Plan Note (Signed)
Stable, takes MiraLax  

## 2021-11-25 NOTE — Assessment & Plan Note (Signed)
Blood pressure is controlled, takes Furosemide, ASA  Bun/creat 26/1.1 06/25/21 ?

## 2021-11-25 NOTE — Assessment & Plan Note (Signed)
takes Levothyroxine. TSH 2.32 11/13/20 ?

## 2021-11-25 NOTE — Assessment & Plan Note (Signed)
Multiple sites, managed with Tylenol, Tramadol.  

## 2021-11-25 NOTE — Assessment & Plan Note (Signed)
avoid constipation 

## 2021-11-25 NOTE — Assessment & Plan Note (Signed)
dependent, better, on Furosemide, Bun/creat 28/1.1 06/25/21 ?

## 2021-11-25 NOTE — Assessment & Plan Note (Signed)
less impulsive, no safety awareness, off  [facility], resides in [facility], under Hospice service.  

## 2021-11-25 NOTE — Assessment & Plan Note (Signed)
takes Risperdal,  Depakote, Duloxetine, Clonazepam prn,  TSH 2.32 11/13/20 ?

## 2021-11-26 ENCOUNTER — Encounter: Payer: Self-pay | Admitting: Nurse Practitioner

## 2021-12-06 ENCOUNTER — Other Ambulatory Visit: Payer: Self-pay | Admitting: Adult Health

## 2021-12-06 DIAGNOSIS — M159 Polyosteoarthritis, unspecified: Secondary | ICD-10-CM

## 2021-12-06 MED ORDER — CLONAZEPAM 0.5 MG PO TABS: 0.5000 mg | ORAL_TABLET | Freq: Four times a day (QID) | ORAL | 0 refills | Status: DC | PRN

## 2021-12-06 MED ORDER — TRAMADOL HCL 50 MG PO TABS: 25.0000 mg | ORAL_TABLET | Freq: Two times a day (BID) | ORAL | 0 refills | Status: DC

## 2021-12-22 ENCOUNTER — Non-Acute Institutional Stay (SKILLED_NURSING_FACILITY): Payer: Medicare Other | Admitting: Orthopedic Surgery

## 2021-12-22 ENCOUNTER — Encounter: Payer: Self-pay | Admitting: Orthopedic Surgery

## 2021-12-22 DIAGNOSIS — G309 Alzheimer's disease, unspecified: Secondary | ICD-10-CM

## 2021-12-22 DIAGNOSIS — I10 Essential (primary) hypertension: Secondary | ICD-10-CM | POA: Diagnosis not present

## 2021-12-22 DIAGNOSIS — E039 Hypothyroidism, unspecified: Secondary | ICD-10-CM

## 2021-12-22 DIAGNOSIS — R609 Edema, unspecified: Secondary | ICD-10-CM | POA: Diagnosis not present

## 2021-12-22 DIAGNOSIS — R635 Abnormal weight gain: Secondary | ICD-10-CM

## 2021-12-22 DIAGNOSIS — F0150 Vascular dementia without behavioral disturbance: Secondary | ICD-10-CM

## 2021-12-22 DIAGNOSIS — M159 Polyosteoarthritis, unspecified: Secondary | ICD-10-CM

## 2021-12-22 DIAGNOSIS — F0280 Dementia in other diseases classified elsewhere without behavioral disturbance: Secondary | ICD-10-CM

## 2021-12-22 DIAGNOSIS — F339 Major depressive disorder, recurrent, unspecified: Secondary | ICD-10-CM

## 2021-12-22 NOTE — Progress Notes (Signed)
?Location:  [facility] ?Nursing Home Room Number: N042/A ?Place of Service:  SNF (31) ?Provider: Yvonna Alanis, NP ? ?Patient Care Team: ?[name], MD as PCP - General (Internal Medicine) ?Mast, Man X, NP as Nurse Practitioner (Internal Medicine) ?[name], MD (Internal Medicine) ? ?Extended Emergency Contact Information ?Primary Emergency Contact: Moyer,Rick ?Address: [address] ?         [place], [place] [address] [place] ?Home Phone: [phone] ?Relation: Son ?Secondary Emergency Contact: Huston,Kathy ?Address: [address] ?         [place] [address] [place] ?Mobile Phone: [phone] ?Relation: Daughter ? ?Code Status:  DNR ?Goals of care: Advanced Directive information ? ?  12/22/2021  ? 12:10 PM  ?Advanced Directives  ?Does Patient Have a Medical Advance Directive? Yes  ?Type of Advance Directive Out of facility DNR (pink MOST or yellow form)  ?Does patient want to make changes to medical advance directive? No - Patient declined  ?Pre-existing out of facility DNR order (yellow form or pink MOST form) Pink MOST form placed in chart (order not valid for inpatient use)  ? ? ? ?Chief Complaint  ?Patient presents with  ? Medical Management of Chronic Issues  ?  Routine visit.   ? Quality Metric Gaps  ?  Discuss the need for Shingrix vaccine, or post pone if patient refuses.   ? ? ?HPI:  ?Pt is a 88 y.o. female seen today for medical management of chronic diseases.   ? ?She currently resides on the skilled nursing unit at [facility]. PMH: HTN, AD, rectocele, constipation, hypothyroidism, bilateral tibial fractures, OA, CKD,  ? ?Dementia-  BIMS 3/15 11/04/2021, followed by hospice, increased yelling 05/16, overall sleeping more per nursing, dependent ADLs,  poor safety awareness, remains on risperidone, Depakote, Cymbalta and clonazepam ?HTN- BUN/creat 26/1.1, remains on furosemide ?BLE- 1+ pitting edema, remains on furosemide  daily ?Hypothyroidism- TSH 2.32 11/2020, levothyroxine daily ?OA-  remains on tylenol and tramadol ?Depression/anxiety- see above,  remains on Cymbalta, Klonopin, Depakote, and Risperdal ?Weight gain- see trends below  ? ?No recent falls or injuries.  ? ?Recent blood pressures: ? 05/17- 124/58 ? 05/10- 128/80, 132/70 ? ?Recent weights: ? 05/01- 152.5 lbs ? 04/01- 149.4 lbs ? 03/01- 143.4 lbs ? ? ? ?Past Medical History:  ?Diagnosis Date  ? Alzheimer disease ([place]) 10/27/2016  ? Arthritis   ? Confusion 04/04/2019  ? Depression, recurrent ([place]) 10/27/2016  ? History of fracture of right hip 04/04/2019  ? 07/01/19 Ortho, R hip incision healed, X-ray looks good, s/p IMN R hip fx, WBAT, f/u 6 mos.   ? HTN (hypertension) 10/27/2016  ? Hypertension   ? Hypertensive kidney disease with CKD (chronic kidney disease) stage V ([place]) 11/02/2016  ? 11/02/16 Na 136, K 4.7, Bun 30, creat 1.52, TSH 0.86, wbc 6.7, Hgb 11.3, plt 264  ? Hypothyroidism 10/27/2016  ? Osteoarthritis 10/27/2016  ? Thyroid disease   ? ?Past Surgical History:  ?Procedure Laterality Date  ? ABDOMINAL HYSTERECTOMY  1986  ? INTRAMEDULLARY (IM) NAIL INTERTROCHANTERIC Right 04/04/2019  ? Procedure: INTRAMEDULLARY (IM) NAIL INTERTROCHANTRIC;  Surgeon: Rod Can, MD;  Location: [facility];  Service: Orthopedics;  Laterality: Right;  ? KNEE ARTHROSCOPY  2015/2017  ? Dr. Gustavus Bryant  ? SPINE SURGERY  2009, 2011  ? Dr.Mark Nicoletta Dress  ? TIBIA IM NAIL INSERTION Right 01/01/2020  ? Procedure: INTRAMEDULLARY (IM) NAIL TIBIAL;  Surgeon: Shona Needles, MD;  Location: [facility]  OR;  Service: Orthopedics;  Laterality: Right;  ? ? ?No Known Allergies ? ?Outpatient Encounter Medications as of 12/22/2021  ?Medication Sig  ? acetaminophen (TYLENOL) 325 MG tablet Take 650 mg by mouth 2 (two) times daily.  ? clonazePAM (KLONOPIN) 0.5 MG tablet Take 1 tablet (0.5 mg total) by mouth every 6 (six) hours as needed for anxiety.  ? divalproex (DEPAKOTE) 125 MG DR tablet Take 250 mg by mouth at bedtime.  ?  DULoxetine (CYMBALTA) 30 MG capsule Take 30 mg by mouth daily.  ? furosemide (LASIX) 20 MG tablet Take 20 mg by mouth every evening.  ? furosemide (LASIX) 40 MG tablet Take 40 mg by mouth every morning.  ? levothyroxine (SYNTHROID, LEVOTHROID) 25 MCG tablet Take 25 mcg by mouth daily before breakfast.  ? ondansetron (ZOFRAN) 4 MG/5ML solution Take 4 mg by mouth daily as needed for nausea or vomiting.  ? polyethylene glycol (MIRALAX / GLYCOLAX) 17 g packet Take 17 g by mouth daily.  ? potassium chloride (MICRO-K) 10 MEQ CR capsule Take 10 mEq by mouth daily.  ? risperiDONE (RISPERDAL) 1 MG tablet Take 1 mg by mouth at bedtime.  ? traMADol (ULTRAM) 50 MG tablet Take 0.5 tablets (25 mg total) by mouth every 6 (six) hours as needed.  ? traMADol (ULTRAM) 50 MG tablet Take 0.5 tablets (25 mg total) by mouth 2 (two) times daily.  ? ?No facility-administered encounter medications on file as of 12/22/2021.  ? ? ?Review of Systems  ?Unable to perform ROS: Dementia  ? ?Immunization History  ?Administered Date(s) Administered  ? Influenza-Unspecified 05/29/2017, 05/20/2020, 05/26/2021  ? Moderna SARS-COV2 Booster Vaccination 01/05/2021  ? Moderna Sars-Covid-2 Vaccination 08/27/2019, 09/07/2019, 10/05/2019, 06/16/2020  ? PFIZER(Purple Top)SARS-COV-2 Vaccination 04/27/2021  ? Pneumococcal Conjugate-13 06/21/2017  ? Pneumococcal Polysaccharide-23 07/16/2018  ? Tdap 06/21/2017  ? ?Pertinent  Health Maintenance Due  ?Topic Date Due  ? DEXA SCAN  Completed  ? INFLUENZA VACCINE  Discontinued  ? ? ?  01/10/2020  ?  9:01 PM 01/11/2020  ?  8:02 AM 01/11/2020  ?  9:00 PM 01/12/2020  ?  7:30 AM 02/01/2021  ?  3:25 PM  ?Fall Risk  ?Falls in the past year?     1  ?Was there an injury with Fall?     1  ?Fall Risk Category Calculator     2  ?Fall Risk Category     Moderate  ?Patient Fall Risk Level High fall risk High fall risk High fall risk High fall risk High fall risk  ?Patient at Risk for Falls Due to     History of fall(s)  ?Fall risk Follow up      Falls evaluation completed  ? ?Functional Status Survey: ?  ? ?Vitals:  ? 12/22/21 1205  ?BP: 128/80  ?Pulse: 70  ?Resp: 20  ?Temp: 97.8 ?F (36.6 ?C)  ?SpO2: 94%  ?Weight: 152 lb 8 oz (69.2 kg)  ?Height: '4\' 11"'$  (1.499 m)  ? ?Body mass index is 30.8 kg/m?Marland Kitchen ?Physical Exam ?Vitals reviewed.  ?Constitutional:   ?   General: She is not in acute distress. ?HENT:  ?   Head: Normocephalic.  ?   Right Ear: There is no impacted cerumen.  ?   Left Ear: There is no impacted cerumen.  ?   Mouth/Throat:  ?   Mouth: Mucous membranes are moist.  ?Eyes:  ?   General:     ?   Right eye: No discharge.     ?  Left eye: No discharge.  ?Cardiovascular:  ?   Rate and Rhythm: Normal rate and regular rhythm.  ?   Pulses: Normal pulses.  ?   Heart sounds: Normal heart sounds.  ?Pulmonary:  ?   Effort: Pulmonary effort is normal. No respiratory distress.  ?   Breath sounds: Normal breath sounds. No wheezing.  ?Abdominal:  ?   General: Bowel sounds are normal. There is no distension.  ?   Palpations: Abdomen is soft.  ?   Tenderness: There is no abdominal tenderness.  ?Musculoskeletal:  ?   Cervical back: Neck supple.  ?   Right lower leg: Edema present.  ?   Left lower leg: Edema present.  ?   Comments: Non pitting  ?Skin: ?   General: Skin is warm and dry.  ?   Capillary Refill: Capillary refill takes less than 2 seconds.  ?Neurological:  ?   General: No focal deficit present.  ?   Mental Status: She is easily aroused. Mental status is at baseline.  ?   Motor: Weakness present.  ?   Gait: Gait abnormal.  ?   Comments: Geri-recliner  ?Psychiatric:     ?   Mood and Affect: Mood normal.     ?   Behavior: Behavior normal.     ?   Cognition and Memory: Cognition is impaired. Memory is impaired.  ?   Comments: Nonverbal, does not follow commands, alert to self  ? ? ?Labs reviewed: ?Recent Labs  ?  03/16/21 ?0000 06/25/21 ?0000  ?NA 140 144  ?K 4.1 3.8  ?CL 106 105  ?CO2 27* 28*  ?BUN 16 26*  ?CREATININE 0.9 1.1  ?CALCIUM 9.6 9.7  ? ?No results  for input(s): AST, ALT, ALKPHOS, BILITOT, PROT, ALBUMIN in the last 8760 hours. ?No results for input(s): WBC, NEUTROABS, HGB, HCT, MCV, PLT in the last 8760 hours. ?Lab Results  ?Component Value Date  ? TSH 2.32 11/14/18

## 2021-12-24 LAB — COMPREHENSIVE METABOLIC PANEL
Albumin: 3.4 — AB (ref 3.5–5.0)
Calcium: 10 (ref 8.7–10.7)
Globulin: 2.9
eGFR: 73

## 2021-12-24 LAB — HEPATIC FUNCTION PANEL
ALT: 6 U/L — AB (ref 7–35)
AST: 9 — AB (ref 13–35)
Alkaline Phosphatase: 78 (ref 25–125)
Bilirubin, Total: 0.3

## 2021-12-24 LAB — BASIC METABOLIC PANEL
BUN: 23 — AB (ref 4–21)
CO2: 28 — AB (ref 13–22)
Chloride: 104 (ref 99–108)
Creatinine: 0.8 (ref 0.5–1.1)
Glucose: 79
Potassium: 3.9 mEq/L (ref 3.5–5.1)
Sodium: 141 (ref 137–147)

## 2021-12-24 LAB — TSH: TSH: 1.34 (ref 0.41–5.90)

## 2022-01-02 ENCOUNTER — Other Ambulatory Visit: Payer: Self-pay | Admitting: Family

## 2022-01-02 DIAGNOSIS — F32A Anxiety disorder, unspecified: Secondary | ICD-10-CM

## 2022-01-02 MED ORDER — CLONAZEPAM 0.5 MG PO TABS: 0.5000 mg | ORAL_TABLET | Freq: Four times a day (QID) | ORAL | 0 refills | Status: DC | PRN

## 2022-01-02 NOTE — Progress Notes (Signed)
[facility] Nurse called request refill for patient's Klonopin 0.5 mg tablet one every 6 hrs refill e-scribed to pharmacy.

## 2022-01-07 ENCOUNTER — Other Ambulatory Visit: Payer: Self-pay | Admitting: Adult Health

## 2022-01-07 DIAGNOSIS — M159 Polyosteoarthritis, unspecified: Secondary | ICD-10-CM

## 2022-01-07 MED ORDER — TRAMADOL HCL 50 MG PO TABS: 25.0000 mg | ORAL_TABLET | Freq: Two times a day (BID) | ORAL | 0 refills | Status: DC

## 2022-01-18 ENCOUNTER — Encounter: Payer: Self-pay | Admitting: Internal Medicine

## 2022-01-18 ENCOUNTER — Non-Acute Institutional Stay (SKILLED_NURSING_FACILITY): Payer: Medicare Other | Admitting: Internal Medicine

## 2022-01-18 DIAGNOSIS — F419 Anxiety disorder, unspecified: Secondary | ICD-10-CM

## 2022-01-18 DIAGNOSIS — E039 Hypothyroidism, unspecified: Secondary | ICD-10-CM

## 2022-01-18 DIAGNOSIS — R600 Localized edema: Secondary | ICD-10-CM

## 2022-01-18 DIAGNOSIS — I10 Essential (primary) hypertension: Secondary | ICD-10-CM | POA: Diagnosis not present

## 2022-01-18 DIAGNOSIS — F0150 Vascular dementia without behavioral disturbance: Secondary | ICD-10-CM

## 2022-01-18 DIAGNOSIS — G309 Alzheimer's disease, unspecified: Secondary | ICD-10-CM

## 2022-01-18 DIAGNOSIS — F0280 Dementia in other diseases classified elsewhere without behavioral disturbance: Secondary | ICD-10-CM

## 2022-01-18 DIAGNOSIS — F32A Depression, unspecified: Secondary | ICD-10-CM

## 2022-01-18 DIAGNOSIS — R635 Abnormal weight gain: Secondary | ICD-10-CM

## 2022-01-18 DIAGNOSIS — F339 Major depressive disorder, recurrent, unspecified: Secondary | ICD-10-CM

## 2022-01-18 NOTE — Progress Notes (Signed)
Location:   [place] Room Number: [facility] Service:  SNF [phone]) Provider:  Veleta Miners MD   [name], MD  Patient Care Team: [name], MD as PCP - General (Internal Medicine) Mast, Man X, NP as Nurse Practitioner (Internal Medicine) [name], MD (Internal Medicine)  Extended Emergency Contact Information Primary Emergency Contact: [facility] Address: [address]          [place], [place] [address] [name] of [place] Phone: [phone] Relation: Son Secondary Emergency Contact: [facility] Address: [address] [place] [address] [name] Phone: [phone] Relation: Daughter  Code Status:  DNR Hospice Goals of care: Advanced Directive information    01/18/2022   11:50 AM  Advanced Directives  Does Patient Have a Medical Advance Directive? Yes  Type of Advance Directive Out of facility DNR (pink MOST or yellow form)  Does patient want to make changes to medical advance directive? No - Patient declined  Pre-existing out of facility DNR order (yellow form or pink MOST form) Pink MOST form placed in chart (order not valid for inpatient use)     Chief Complaint  Patient presents with   Medical Management of Chronic Issues   Quality Metric Gaps    Verified [facility] and [facility] patient is due for: Zoster Vaccines- Shingrix (1 of 2)        HPI:  Pt is a 88 y.o. female seen today for medical management of chronic diseases.    Patient is long term care resident Patient  has h/o Hypertension,  Hypothyroidism Alzheimer's Dementia with behavior issues, Depression, B12 def, S/P Right Hip fracture and Right Tibia Fibula fracture  Enrolled in Hospice for her Progressive dementia     She is stable. No new Nursing issues.Her weight is stable Does not use her wheelchair anymore Stays in her [place] sometimes . Keeps her head down and have periorbital swelling No  Falls Wt Readings from Last 3 Encounters:  01/18/22 155 lb 14.4 oz (70.7 kg)  12/22/21 152 lb 8 oz (69.2 kg)  10/12/21 143 lb 6.4 oz (65 kg)   Past Medical History:  Diagnosis Date   Alzheimer disease ([place]) 10/27/2016   Arthritis    Confusion 04/04/2019   Depression, recurrent ([place]) 10/27/2016   History of fracture of right hip 04/04/2019   07/01/19 Ortho, R hip incision healed, X-ray looks good, s/p IMN R hip fx, WBAT, f/u 6 mos.    HTN (hypertension) 10/27/2016   Hypertension    Hypertensive kidney disease with CKD (chronic kidney disease) stage V ([place]) 11/02/2016   11/02/16 Na 136, K 4.7, Bun 30, creat 1.52, TSH 0.86, wbc 6.7, Hgb 11.3, plt 264   Hypothyroidism 10/27/2016   Osteoarthritis 10/27/2016   Thyroid disease    Past Surgical History:  Procedure Laterality Date   ABDOMINAL HYSTERECTOMY  1986   INTRAMEDULLARY (IM) NAIL INTERTROCHANTERIC Right 04/04/2019   Procedure: INTRAMEDULLARY (IM) NAIL INTERTROCHANTRIC;  Surgeon: Rod Can, MD;  Location: [facility];  Service: Orthopedics;  Laterality: Right;   KNEE ARTHROSCOPY  2015/2017   Dr. Gustavus Bryant   SPINE SURGERY  2009, 2011   Dr.Mark Nicoletta Dress   TIBIA IM NAIL INSERTION Right 01/01/2020   Procedure: INTRAMEDULLARY (IM) NAIL TIBIAL;  Surgeon: Shona Needles, MD;  Location: [facility];  Service: Orthopedics;  Laterality: Right;    No Known Allergies  Allergies as of 01/18/2022  No Known Allergies      Medication List        Accurate as of January 18, 2022 11:51 AM. If you have any questions, ask your nurse or doctor.          acetaminophen 325 MG tablet Commonly known as: TYLENOL Take 650 mg by mouth 2 (two) times daily.   clonazePAM 0.5 MG tablet Commonly known as: KLONOPIN Take 1 tablet (0.5 mg total) by mouth every 6 (six) hours as needed for anxiety.   divalproex 125 MG DR tablet Commonly known as: DEPAKOTE Take 250 mg by mouth at bedtime.   DULoxetine 30 MG capsule Commonly known as: CYMBALTA Take 30 mg by mouth  daily.   furosemide 20 MG tablet Commonly known as: LASIX Take 20 mg by mouth every evening.   furosemide 40 MG tablet Commonly known as: LASIX Take 40 mg by mouth every morning.   levothyroxine 25 MCG tablet Commonly known as: SYNTHROID Take 25 mcg by mouth daily before breakfast.   ondansetron 4 MG/5ML solution Commonly known as: ZOFRAN Take 4 mg by mouth daily as needed for nausea or vomiting.   polyethylene glycol 17 g packet Commonly known as: MIRALAX / GLYCOLAX Take 17 g by mouth daily.   potassium chloride 10 MEQ CR capsule Commonly known as: MICRO-K Take 10 mEq by mouth daily.   risperiDONE 1 MG tablet Commonly known as: RISPERDAL Take 1 mg by mouth at bedtime.   traMADol 50 MG tablet Commonly known as: ULTRAM Take 0.5 tablets (25 mg total) by mouth every 6 (six) hours as needed.   traMADol 50 MG tablet Commonly known as: ULTRAM Take 0.5 tablets (25 mg total) by mouth 2 (two) times daily.        Review of Systems  Immunization History  Administered Date(s) Administered   Influenza-Unspecified 05/29/2017, 05/20/2020, 05/26/2021   Moderna SARS-COV2 Booster Vaccination 01/05/2021   Moderna Sars-Covid-2 Vaccination 08/27/2019, 09/07/2019, 10/05/2019, 06/16/2020   PFIZER(Purple Top)SARS-COV-2 Vaccination 04/27/2021   Pneumococcal Conjugate-13 06/21/2017   Pneumococcal Polysaccharide-23 07/16/2018   Tdap 06/21/2017   Pertinent  Health Maintenance Due  Topic Date Due   DEXA SCAN  Completed   INFLUENZA VACCINE  Discontinued      01/10/2020    9:01 PM 01/11/2020    8:02 AM 01/11/2020    9:00 PM 01/12/2020    7:30 AM 02/01/2021    3:25 PM  Fall Risk  Falls in the past year?     1  Was there an injury with Fall?     1  Fall Risk Category Calculator     2  Fall Risk Category     Moderate  Patient Fall Risk Level High fall risk High fall risk High fall risk High fall risk High fall risk  Patient at Risk for Falls Due to     History of fall(s)  Fall risk  Follow up     Falls evaluation completed   Functional Status Survey:    Vitals:   01/18/22 1142  BP: 104/67  Pulse: 64  Resp: 20  Temp: (!) 97.2 F (36.2 C)  SpO2: 90%  Weight: 155 lb 14.4 oz (70.7 kg)  Height: '4\' 11"'$  (1.499 m)   Body mass index is 31.49 kg/m. Physical Exam  Labs reviewed: Recent Labs    03/16/21 0000 06/25/21 0000 12/24/21 0000  NA 140 144 141  K 4.1 3.8 3.9  CL 106 105 104  CO2 27* 28* 28*  BUN 16 26*  23*  CREATININE 0.9 1.1 0.8  CALCIUM 9.6 9.7 10.0   Recent Labs    12/24/21 0000  AST 9*  ALT 6*  ALKPHOS 78  ALBUMIN 3.4*   No results for input(s): "WBC", "NEUTROABS", "HGB", "HCT", "MCV", "PLT" in the last 8760 hours. Lab Results  Component Value Date   TSH 1.34 12/24/2021   Lab Results  Component Value Date   HGBA1C 5.3 11/01/2016   No results found for: "CHOL", "HDL", "LDLCALC", "LDLDIRECT", "TRIG", "CHOLHDL"  Significant Diagnostic Results in last 30 days:  No results found.  Assessment/Plan 1. Mixed Alzheimer's and vascular dementia ([place]) Takes Depakote and Risperdal for behaviors Also Has PRN Klonopin Enrolled in Hospice  2. Anxiety and depression On Cymbalta and Klonopin  3. Essential hypertension Off all meds except Lasix  4. Acquired hypothyroidism TSH normal in 05/23  5. Depression, recurrent ([place]) Cymbalta  6. Weight gain ? Does not look Fluid overload Will continue to monitor  7. Bilateral leg edema On Lasix and Creat stable    Family/ staff Communication:   Labs/tests ordered:

## 2022-01-20 ENCOUNTER — Other Ambulatory Visit: Payer: Self-pay | Admitting: Adult Health

## 2022-01-20 DIAGNOSIS — F419 Anxiety disorder, unspecified: Secondary | ICD-10-CM

## 2022-01-20 MED ORDER — CLONAZEPAM 0.5 MG PO TABS: 0.5000 mg | ORAL_TABLET | Freq: Four times a day (QID) | ORAL | 0 refills | Status: DC | PRN

## 2022-02-07 ENCOUNTER — Other Ambulatory Visit: Payer: Self-pay | Admitting: Adult Health

## 2022-02-07 ENCOUNTER — Non-Acute Institutional Stay (SKILLED_NURSING_FACILITY): Payer: Medicare Other | Admitting: Adult Health

## 2022-02-07 ENCOUNTER — Encounter: Payer: Self-pay | Admitting: Adult Health

## 2022-02-07 DIAGNOSIS — F0280 Dementia in other diseases classified elsewhere without behavioral disturbance: Secondary | ICD-10-CM

## 2022-02-07 DIAGNOSIS — F0150 Vascular dementia without behavioral disturbance: Secondary | ICD-10-CM

## 2022-02-07 DIAGNOSIS — E039 Hypothyroidism, unspecified: Secondary | ICD-10-CM

## 2022-02-07 DIAGNOSIS — G309 Alzheimer's disease, unspecified: Secondary | ICD-10-CM | POA: Diagnosis not present

## 2022-02-07 DIAGNOSIS — F419 Anxiety disorder, unspecified: Secondary | ICD-10-CM

## 2022-02-07 DIAGNOSIS — K5901 Slow transit constipation: Secondary | ICD-10-CM | POA: Diagnosis not present

## 2022-02-07 DIAGNOSIS — F32A Depression, unspecified: Secondary | ICD-10-CM

## 2022-02-07 DIAGNOSIS — M159 Polyosteoarthritis, unspecified: Secondary | ICD-10-CM

## 2022-02-07 MED ORDER — TRAMADOL HCL 50 MG PO TABS: 25.0000 mg | ORAL_TABLET | Freq: Two times a day (BID) | ORAL | 0 refills | Status: DC

## 2022-02-07 NOTE — Progress Notes (Signed)
Location:  [place] Room Number: N042/A Place of Service:  SNF (31) Provider:  Durenda Age, DNP, FNP-BC  Patient Care Team: [name], MD as PCP - General (Internal Medicine) Mast, Man X, NP as Nurse Practitioner (Internal Medicine) [name], MD (Internal Medicine)  Extended Emergency Contact Information Primary Emergency Contact: [facility] Address: [address]          [place], [place] [address] [name] of [place] Phone: [phone] Relation: Son Secondary Emergency Contact: [facility] Address: [address] [place] [address] [name] Phone: [phone] Relation: Daughter  Code Status:  DNR  Goals of care: Advanced Directive information    02/07/2022    2:45 PM  Advanced Directives  Does Patient Have a Medical Advance Directive? Yes  Type of Advance Directive Out of facility DNR (pink MOST or yellow form)  Does patient want to make changes to medical advance directive? No - Patient declined  Pre-existing out of facility DNR order (yellow form or pink MOST form) Pink MOST form placed in chart (order not valid for inpatient use)     Chief Complaint  Patient presents with   Medical Management of Chronic Issues    Routine Visit    HPI:  Haley Cohen is a 88 y.o. female seen today for medical management of chronic diseases. She is a long-term care resident of [facility]. She has a PMH of hypertension, rectocele, constipation, hypothyroidism, bilateral tibial fractures, osteoarthritis and CKD.  Acquired hypothyroidism -  tsh 1.34, takes Levothyroxine 25 mcg daily  Slow transit constipation  -   takes Miralax 17 gm daily  Anxiety and depression -   PHQ-9 score 0, takes Cymbalta 30 mg daily, Klonopin 0.5 mg every 6 hours PRN and Risperdal 1 mg at bedtime.  Mixed Alzheimer's and vascular dementia ([place]) -  BIMS score 0, ranging in severe cognitive impairment   Past  Medical History:  Diagnosis Date   Alzheimer disease ([place]) 10/27/2016   Arthritis    Confusion 04/04/2019   Depression, recurrent ([place]) 10/27/2016   History of fracture of right hip 04/04/2019   07/01/19 Ortho, R hip incision healed, X-ray looks good, s/p IMN R hip fx, WBAT, f/u 6 mos.    HTN (hypertension) 10/27/2016   Hypertension    Hypertensive kidney disease with CKD (chronic kidney disease) stage V ([place]) 11/02/2016   11/02/16 Na 136, K 4.7, Bun 30, creat 1.52, TSH 0.86, wbc 6.7, Hgb 11.3, plt 264   Hypothyroidism 10/27/2016   Osteoarthritis 10/27/2016   Thyroid disease    Past Surgical History:  Procedure Laterality Date   ABDOMINAL HYSTERECTOMY  1986   INTRAMEDULLARY (IM) NAIL INTERTROCHANTERIC Right 04/04/2019   Procedure: INTRAMEDULLARY (IM) NAIL INTERTROCHANTRIC;  Surgeon: Rod Can, MD;  Location: [facility];  Service: Orthopedics;  Laterality: Right;   KNEE ARTHROSCOPY  2015/2017   Dr. Gustavus Bryant   SPINE SURGERY  2009, 2011   Dr.Mark Nicoletta Dress   TIBIA IM NAIL INSERTION Right 01/01/2020   Procedure: INTRAMEDULLARY (IM) NAIL TIBIAL;  Surgeon: Shona Needles, MD;  Location: [facility];  Service: Orthopedics;  Laterality: Right;    No Known Allergies  Outpatient Encounter Medications as of 02/07/2022  Medication Sig   acetaminophen (TYLENOL) 325 MG tablet Take 650 mg by mouth 2 (two) times daily.   divalproex (DEPAKOTE) 125 MG DR tablet Take 250 mg by mouth at bedtime.   DULoxetine (  CYMBALTA) 30 MG capsule Take 30 mg by mouth daily.   Eyelid Cleansers (OCUSOFT EYELID CLEANSING) PADS Apply topically in the morning and at bedtime. One wipe per eye and cleanse both eyes for excess secretions   furosemide (LASIX) 20 MG tablet Take 20 mg by mouth every evening.   furosemide (LASIX) 40 MG tablet Take 40 mg by mouth daily. Continue 40 mg daily (add 20 mg daily at 2 pm)   levothyroxine (SYNTHROID, LEVOTHROID) 25 MCG tablet Take 25 mcg by mouth daily before breakfast.   ondansetron (ZOFRAN) 4  MG/5ML solution Take 4 mg by mouth daily as needed for nausea or vomiting.   polyethylene glycol (MIRALAX / GLYCOLAX) 17 g packet Take 17 g by mouth daily.   potassium chloride (MICRO-K) 10 MEQ CR capsule Take 10 mEq by mouth daily.   risperiDONE (RISPERDAL) 1 MG tablet Take 1 mg by mouth at bedtime.   traMADol (ULTRAM) 50 MG tablet Take 0.5 tablets (25 mg total) by mouth every 6 (six) hours as needed.   traMADol (ULTRAM) 50 MG tablet Take 0.5 tablets (25 mg total) by mouth 2 (two) times daily.   [DISCONTINUED] clonazePAM (KLONOPIN) 0.5 MG tablet Take 1 tablet (0.5 mg total) by mouth every 6 (six) hours as needed for anxiety.   No facility-administered encounter medications on file as of 02/07/2022.    Review of Systems    Unable to obtain due to dementia.    Immunization History  Administered Date(s) Administered   Influenza-Unspecified 05/29/2017, 05/20/2020, 05/26/2021   Moderna SARS-COV2 Booster Vaccination 01/05/2021   Moderna Sars-Covid-2 Vaccination 08/27/2019, 09/07/2019, 10/05/2019, 06/16/2020   PFIZER(Purple Top)SARS-COV-2 Vaccination 04/27/2021   Pneumococcal Conjugate-13 06/21/2017   Pneumococcal Polysaccharide-23 07/16/2018   Tdap 06/21/2017   Pertinent  Health Maintenance Due  Topic Date Due   DEXA SCAN  Completed   INFLUENZA VACCINE  Discontinued      01/10/2020    9:01 PM 01/11/2020    8:02 AM 01/11/2020    9:00 PM 01/12/2020    7:30 AM 02/01/2021    3:25 PM  Fall Risk  Falls in the past year?     1  Was there an injury with Fall?     1  Fall Risk Category Calculator     2  Fall Risk Category     Moderate  Patient Fall Risk Level High fall risk High fall risk High fall risk High fall risk High fall risk  Patient at Risk for Falls Due to     History of fall(s)  Fall risk Follow up     Falls evaluation completed     Vitals:   02/07/22 1438  BP: 122/80  Pulse: 99  Resp: 20  Temp: 97.9 F (36.6 C)  SpO2: 91%  Weight: 155 lb 12.8 oz (70.7 kg)  Height: '4\' 11"'$   (1.499 m)   Body mass index is 31.47 kg/m.  Physical Exam Constitutional:      General: She is not in acute distress.    Appearance: Normal appearance.  HENT:     Head: Normocephalic and atraumatic.     Nose: Nose normal.     Mouth/Throat:     Mouth: Mucous membranes are moist.  Eyes:     Conjunctiva/sclera: Conjunctivae normal.  Cardiovascular:     Rate and Rhythm: Normal rate and regular rhythm.  Pulmonary:     Effort: Pulmonary effort is normal.     Breath sounds: Normal breath sounds.  Abdominal:     General:  Bowel sounds are normal.     Palpations: Abdomen is soft.  Musculoskeletal:        General: Normal range of motion.     Cervical back: Normal range of motion.  Skin:    General: Skin is warm and dry.  Neurological:     General: No focal deficit present.     Mental Status: She is disoriented.  Psychiatric:        Mood and Affect: Mood normal.        Behavior: Behavior normal.        Labs reviewed: Recent Labs    03/16/21 0000 06/25/21 0000 12/24/21 0000  NA 140 144 141  K 4.1 3.8 3.9  CL 106 105 104  CO2 27* 28* 28*  BUN 16 26* 23*  CREATININE 0.9 1.1 0.8  CALCIUM 9.6 9.7 10.0   Recent Labs    12/24/21 0000  AST 9*  ALT 6*  ALKPHOS 78  ALBUMIN 3.4*   No results for input(s): "WBC", "NEUTROABS", "HGB", "HCT", "MCV", "PLT" in the last 8760 hours. Lab Results  Component Value Date   TSH 1.34 12/24/2021   Lab Results  Component Value Date   HGBA1C 5.3 11/01/2016   No results found for: "CHOL", "HDL", "LDLCALC", "LDLDIRECT", "TRIG", "CHOLHDL"  Significant Diagnostic Results in last 30 days:  No results found.  Assessment/Plan  1. Acquired hypothyroidism Lab Results  Component Value Date   TSH 1.34 12/24/2021   -  continue Levothyroxine  2. Slow transit constipation -    stable, continue Miralax  3. Anxiety and depression -  no depressed mood, continue Cymbalta, Klonopin PRN and Risperdal  4. Mixed Alzheimer's and vascular  dementia ([place]) -  has severe cognitive impairment -  continue hospice care    Family/ staff Communication: Discussed plan of care with resident and charge nurse  Labs/tests ordered:   None    Durenda Age, DNP, MSN, FNP-BC [facility] [phone] (Monday-Friday 8:00 a.m. - 5:00 p.m.) [phone] (after hours)

## 2022-02-11 ENCOUNTER — Other Ambulatory Visit: Payer: Self-pay | Admitting: Adult Health

## 2022-02-11 DIAGNOSIS — F32A Depression, unspecified: Secondary | ICD-10-CM

## 2022-02-11 MED ORDER — CLONAZEPAM 0.5 MG PO TABS: 0.5000 mg | ORAL_TABLET | Freq: Four times a day (QID) | ORAL | 0 refills | Status: AC | PRN

## 2022-02-21 ENCOUNTER — Other Ambulatory Visit: Payer: Self-pay | Admitting: Orthopedic Surgery

## 2022-03-09 ENCOUNTER — Other Ambulatory Visit: Payer: Self-pay | Admitting: Adult Health

## 2022-03-09 DIAGNOSIS — M159 Polyosteoarthritis, unspecified: Secondary | ICD-10-CM

## 2022-03-09 MED ORDER — TRAMADOL HCL 50 MG PO TABS: 25.0000 mg | ORAL_TABLET | Freq: Two times a day (BID) | ORAL | 0 refills | Status: AC

## 2022-03-11 ENCOUNTER — Encounter: Payer: Self-pay | Admitting: Nurse Practitioner

## 2022-03-11 ENCOUNTER — Non-Acute Institutional Stay (SKILLED_NURSING_FACILITY): Payer: Medicare Other | Admitting: Nurse Practitioner

## 2022-03-11 DIAGNOSIS — F0280 Dementia in other diseases classified elsewhere without behavioral disturbance: Secondary | ICD-10-CM

## 2022-03-11 DIAGNOSIS — R609 Edema, unspecified: Secondary | ICD-10-CM | POA: Diagnosis not present

## 2022-03-11 DIAGNOSIS — M159 Polyosteoarthritis, unspecified: Secondary | ICD-10-CM

## 2022-03-11 DIAGNOSIS — N816 Rectocele: Secondary | ICD-10-CM

## 2022-03-11 DIAGNOSIS — E039 Hypothyroidism, unspecified: Secondary | ICD-10-CM

## 2022-03-11 DIAGNOSIS — G309 Alzheimer's disease, unspecified: Secondary | ICD-10-CM

## 2022-03-11 DIAGNOSIS — F32A Depression, unspecified: Secondary | ICD-10-CM

## 2022-03-11 DIAGNOSIS — K5901 Slow transit constipation: Secondary | ICD-10-CM

## 2022-03-11 DIAGNOSIS — I10 Essential (primary) hypertension: Secondary | ICD-10-CM

## 2022-03-11 DIAGNOSIS — F419 Anxiety disorder, unspecified: Secondary | ICD-10-CM

## 2022-03-11 DIAGNOSIS — F0150 Vascular dementia without behavioral disturbance: Secondary | ICD-10-CM

## 2022-03-11 NOTE — Assessment & Plan Note (Signed)
Stable, takes MiraLax  

## 2022-03-11 NOTE — Assessment & Plan Note (Signed)
Multiple sites, managed with Tylenol, Tramadol.

## 2022-03-11 NOTE — Assessment & Plan Note (Signed)
dependent, better, on Furosemide, Bun/creat 23/0.8 12/24/21

## 2022-03-11 NOTE — Assessment & Plan Note (Signed)
less impulsive, no safety awareness, off  [place], resides in SNF [facility], under Hospice service.

## 2022-03-11 NOTE — Assessment & Plan Note (Signed)
takes Levothyroxine. TSH 1.34 12/24/21

## 2022-03-11 NOTE — Assessment & Plan Note (Signed)
Blood pressure is controlled, on Furosemide.

## 2022-03-11 NOTE — Assessment & Plan Note (Signed)
avoid constipation

## 2022-03-11 NOTE — Progress Notes (Signed)
Location:  [facility] Room Number: NO/42/A Place of Service:  SNF (31) Provider:  Tia Gelb X, NP [name], MD  Patient Care Team: [name], MD as PCP - General (Internal Medicine) Kaleigh Spiegelman X, NP as Nurse Practitioner (Internal Medicine) [name], MD (Internal Medicine)  Extended Emergency Contact Information Primary Emergency Contact: [facility] Address: [address]          [place], [place] [address] [name] of [place] Phone: [phone] Relation: Son Secondary Emergency Contact: [facility] Address: [address]          [place] [address] [name] Phone: [phone] Relation: Daughter  Code Status:  DNR Goals of care: Advanced Directive information    03/11/2022   10:53 AM  Advanced Directives  Does Patient Have a Medical Advance Directive? Yes  Type of Advance Directive Out of facility DNR (pink MOST or yellow form)  Does patient want to make changes to medical advance directive? No - Patient declined  Pre-existing out of facility DNR order (yellow form or pink MOST form) Pink MOST form placed in chart (order not valid for inpatient use)     Chief Complaint  Patient presents with   Medical Management of Chronic Issues    Patient is here for a follow up for chronic conditions, discuss need for Shingrix vaccine     HPI:  Pt is a 88 y.o. female seen today for managing chronic medication conditions   Edema, dependent, better, on Furosemide, Bun/creat 23/0.8 12/24/21             Constipation, takes MiraLax.             Rectocele avoid constipation             Dementia, less impulsive, no safety awareness, off  [facility], resides in [facility], under Hospice service.              Depression/anxiety, takes Risperdal,  Depakote, Duloxetine, Clonazepam prn,  TSH 1.34 12/24/21             HTN, takes Furosemide             Hypothyroidism, takes Levothyroxine. TSH 1.34 12/24/21             OA,   Multiple sites, managed with Tylenol, Tramadol.    Past Medical History:  Diagnosis Date   Alzheimer disease ([place]) 10/27/2016   Arthritis    Confusion 04/04/2019   Depression, recurrent ([place]) 10/27/2016   History of fracture of right hip 04/04/2019   07/01/19 Ortho, R hip incision healed, X-ray looks good, s/p IMN R hip fx, WBAT, f/u 6 mos.    HTN (hypertension) 10/27/2016   Hypertension    Hypertensive kidney disease with CKD (chronic kidney disease) stage V ([place]) 11/02/2016   11/02/16 Na 136, K 4.7, Bun 30, creat 1.52, TSH 0.86, wbc 6.7, Hgb 11.3, plt 264   Hypothyroidism 10/27/2016   Osteoarthritis 10/27/2016   Thyroid disease    Past Surgical History:  Procedure Laterality Date   ABDOMINAL HYSTERECTOMY  1986   INTRAMEDULLARY (IM) NAIL INTERTROCHANTERIC Right 04/04/2019   Procedure: INTRAMEDULLARY (IM) NAIL INTERTROCHANTRIC;  Surgeon: Rod Can, MD;  Location: [facility];  Service: Orthopedics;  Laterality: Right;   KNEE ARTHROSCOPY  2015/2017   Dr. Gustavus Bryant   SPINE SURGERY  2009, 2011   Dr.Mark Nicoletta Dress   TIBIA IM NAIL INSERTION Right 01/01/2020   Procedure: INTRAMEDULLARY (IM) NAIL TIBIAL;  Surgeon: Shona Needles, MD;  Location: [facility];  Service: Orthopedics;  Laterality: Right;    No Known Allergies  Outpatient Encounter Medications as of 03/11/2022  Medication Sig   acetaminophen (TYLENOL) 325 MG tablet Take 650 mg by mouth 2 (two) times daily.   clonazePAM (KLONOPIN) 0.5 MG tablet Take 1 tablet (0.5 mg total) by mouth every 6 (six) hours as needed for anxiety.   divalproex (DEPAKOTE) 125 MG DR tablet Take 250 mg by mouth at bedtime.   DULoxetine (CYMBALTA) 30 MG capsule Take 30 mg by mouth daily.   Eyelid Cleansers (OCUSOFT EYELID CLEANSING) PADS Apply topically in the morning and at bedtime. One wipe per eye and cleanse both eyes for excess secretions   furosemide (LASIX) 20 MG tablet Take 20 mg by mouth every evening.   furosemide (LASIX) 40 MG tablet Take 40 mg by mouth  daily. Continue 40 mg daily (add 20 mg daily at 2 pm)   levothyroxine (SYNTHROID, LEVOTHROID) 25 MCG tablet Take 25 mcg by mouth daily before breakfast.   ondansetron (ZOFRAN) 4 MG/5ML solution Take 4 mg by mouth daily as needed for nausea or vomiting.   polyethylene glycol (MIRALAX / GLYCOLAX) 17 g packet Take 17 g by mouth daily.   potassium chloride (MICRO-K) 10 MEQ CR capsule Take 10 mEq by mouth daily.   risperiDONE (RISPERDAL) 1 MG tablet Take 1 mg by mouth at bedtime.   traMADol (ULTRAM) 50 MG tablet Take 0.5 tablets (25 mg total) by mouth every 6 (six) hours as needed.   traMADol (ULTRAM) 50 MG tablet Take 0.5 tablets (25 mg total) by mouth 2 (two) times daily.   No facility-administered encounter medications on file as of 03/11/2022.    Review of Systems  Unable to perform ROS: Dementia    Immunization History  Administered Date(s) Administered   Influenza-Unspecified 05/29/2017, 05/20/2020, 05/26/2021   Moderna SARS-COV2 Booster Vaccination 01/05/2021   Moderna Sars-Covid-2 Vaccination 08/27/2019, 09/07/2019, 10/05/2019, 06/16/2020   PFIZER(Purple Top)SARS-COV-2 Vaccination 04/27/2021   Pneumococcal Conjugate-13 06/21/2017   Pneumococcal Polysaccharide-23 07/16/2018   Tdap 06/21/2017   Pertinent  Health Maintenance Due  Topic Date Due   DEXA SCAN  Completed   INFLUENZA VACCINE  Discontinued      01/11/2020    8:02 AM 01/11/2020    9:00 PM 01/12/2020    7:30 AM 02/01/2021    3:25 PM 03/11/2022   10:53 AM  Fall Risk  Falls in the past year?    1 0  Was there an injury with Fall?    1 0  Fall Risk Category Calculator    2 0  Fall Risk Category    Moderate Low  Patient Fall Risk Level High fall risk High fall risk High fall risk High fall risk Low fall risk  Patient at Risk for Falls Due to    History of fall(s) No Fall Risks  Fall risk Follow up    Falls evaluation completed Falls evaluation completed   Functional Status Survey:    Vitals:   03/11/22 1054  BP: 116/65   Pulse: 64  Resp: 18  Temp: (!) 96.6 F (35.9 C)  SpO2: 92%  Weight: 152 lb (68.9 kg)  Height: '4\' 11"'$  (1.499 m)   Body mass index is 30.7 kg/m. Physical Exam Vitals and nursing note reviewed.  Constitutional:      Appearance: Normal appearance.  HENT:     Head: Normocephalic and atraumatic.     Mouth/Throat:  Mouth: Mucous membranes are moist.  Eyes:     Extraocular Movements: Extraocular movements intact.     Conjunctiva/sclera: Conjunctivae normal.     Pupils: Pupils are equal, round, and reactive to light.  Cardiovascular:     Rate and Rhythm: Normal rate and regular rhythm.  Pulmonary:     Effort: Pulmonary effort is normal.     Breath sounds: No rales.  Abdominal:     General: Bowel sounds are normal.     Palpations: Abdomen is soft.     Tenderness: There is no abdominal tenderness.  Musculoskeletal:     Right lower leg: Edema present.     Left lower leg: Edema present.     Comments: Edema trace BLE, dependent, trace to 1+  Skin:    General: Skin is warm and dry.     Comments: R+L heel pressure ulcer scars.   Neurological:     General: No focal deficit present.     Mental Status: She is alert.     Gait: Gait abnormal.  Psychiatric:        Mood and Affect: Mood normal.     Comments: confused.      Labs reviewed: Recent Labs    03/16/21 0000 06/25/21 0000 12/24/21 0000  NA 140 144 141  K 4.1 3.8 3.9  CL 106 105 104  CO2 27* 28* 28*  BUN 16 26* 23*  CREATININE 0.9 1.1 0.8  CALCIUM 9.6 9.7 10.0   Recent Labs    12/24/21 0000  AST 9*  ALT 6*  ALKPHOS 78  ALBUMIN 3.4*   No results for input(s): "WBC", "NEUTROABS", "HGB", "HCT", "MCV", "PLT" in the last 8760 hours. Lab Results  Component Value Date   TSH 1.34 12/24/2021   Lab Results  Component Value Date   HGBA1C 5.3 11/01/2016   No results found for: "CHOL", "HDL", "LDLCALC", "LDLDIRECT", "TRIG", "CHOLHDL"  Significant Diagnostic Results in last 30 days:  No results  found.  Assessment/Plan Dependent edema dependent, better, on Furosemide, Bun/creat 23/0.8 12/24/21  Slow transit constipation Stable, takes MiraLax.  Rectocele avoid constipation  Mixed Alzheimer's and vascular dementia ([place])  less impulsive, no safety awareness, off  [facility], resides in [facility], under Hospice service.   Anxiety and depression Mood is managed, takes Risperdal,  Depakote, Duloxetine, Clonazepam prn,  TSH 1.34 12/24/21  Essential hypertension Blood pressure is controlled, on Furosemide.   Hypothyroidism  takes Levothyroxine. TSH 1.34 12/24/21  Generalized osteoarthritis of multiple sites Multiple sites, managed with Tylenol, Tramadol.      Family/ staff Communication: plan of care reviewed with the patient and charge nurse.   Labs/tests ordered:  none  25 minutes.

## 2022-03-11 NOTE — Assessment & Plan Note (Signed)
Mood is managed, takes Risperdal,  Depakote, Duloxetine, Clonazepam prn,  TSH 1.34 12/24/21

## 2022-04-04 ENCOUNTER — Non-Acute Institutional Stay (SKILLED_NURSING_FACILITY): Payer: Medicare Other | Admitting: Nurse Practitioner

## 2022-04-04 DIAGNOSIS — F0150 Vascular dementia without behavioral disturbance: Secondary | ICD-10-CM

## 2022-04-04 DIAGNOSIS — G309 Alzheimer's disease, unspecified: Secondary | ICD-10-CM

## 2022-04-04 DIAGNOSIS — I10 Essential (primary) hypertension: Secondary | ICD-10-CM

## 2022-04-04 DIAGNOSIS — R609 Edema, unspecified: Secondary | ICD-10-CM | POA: Diagnosis not present

## 2022-04-04 DIAGNOSIS — K5901 Slow transit constipation: Secondary | ICD-10-CM

## 2022-04-04 DIAGNOSIS — H0100B Unspecified blepharitis left eye, upper and lower eyelids: Secondary | ICD-10-CM

## 2022-04-04 DIAGNOSIS — H0100A Unspecified blepharitis right eye, upper and lower eyelids: Secondary | ICD-10-CM | POA: Diagnosis not present

## 2022-04-04 DIAGNOSIS — F32A Depression, unspecified: Secondary | ICD-10-CM

## 2022-04-04 DIAGNOSIS — N816 Rectocele: Secondary | ICD-10-CM | POA: Diagnosis not present

## 2022-04-04 DIAGNOSIS — F419 Anxiety disorder, unspecified: Secondary | ICD-10-CM

## 2022-04-04 DIAGNOSIS — H01003 Unspecified blepharitis right eye, unspecified eyelid: Secondary | ICD-10-CM | POA: Insufficient documentation

## 2022-04-04 DIAGNOSIS — E039 Hypothyroidism, unspecified: Secondary | ICD-10-CM

## 2022-04-04 DIAGNOSIS — F0280 Dementia in other diseases classified elsewhere without behavioral disturbance: Secondary | ICD-10-CM

## 2022-04-04 DIAGNOSIS — M159 Polyosteoarthritis, unspecified: Secondary | ICD-10-CM

## 2022-04-04 NOTE — Progress Notes (Unsigned)
Location:   SNF [place] Room Number: 42A Place of Service:  SNF (31) Provider: [facility] Shadd Dunstan NP  [name], MD  Patient Care Team: [name], MD as PCP - General (Internal Medicine) Alanis Clift X, NP as Nurse Practitioner (Internal Medicine) [name], MD (Internal Medicine)  Extended Emergency Contact Information Primary Emergency Contact: [facility] Address: [address]          [place], [place] [address] [name] of [place] Phone: [phone] Relation: Son Secondary Emergency Contact: [facility] Address: [address] [place] [address] [name] Phone: [phone] Relation: Daughter  Code Status: DNR Goals of care: Advanced Directive information    03/11/2022   10:53 AM  Advanced Directives  Does Patient Have a Medical Advance Directive? Yes  Type of Advance Directive Out of facility DNR (pink MOST or yellow form)  Does patient want to make changes to medical advance directive? No - Patient declined  Pre-existing out of facility DNR order (yellow form or pink MOST form) Pink MOST form placed in chart (order not valid for inpatient use)     Chief Complaint  Patient presents with  . Acute Visit    Eye discharge.     HPI:  Pt is a 88 y.o. female seen today for an acute visit for reported green discharge from left eye. No injected conjunctivae, crusted eyelashes noted.       Edema, dependent, better, on Furosemide, Bun/creat 23/0.8 12/24/21             Constipation, takes MiraLax.             Rectocele avoid constipation             Dementia, less impulsive, no safety awareness, off  [facility], resides in [facility], under Hospice service.              Depression/anxiety, takes Risperdal,  Depakote, Duloxetine, Clonazepam prn,  TSH 1.34 12/24/21             HTN, takes Furosemide             Hypothyroidism, takes Levothyroxine. TSH 1.34 12/24/21             OA,  Multiple sites, managed with Tylenol,  Tramadol.     Past Medical History:  Diagnosis Date  . Alzheimer disease ([place]) 10/27/2016  . Arthritis   . Confusion 04/04/2019  . Depression, recurrent ([place]) 10/27/2016  . History of fracture of right hip 04/04/2019   07/01/19 Ortho, R hip incision healed, X-ray looks good, s/p IMN R hip fx, WBAT, f/u 6 mos.   Marland Kitchen HTN (hypertension) 10/27/2016  . Hypertension   . Hypertensive kidney disease with CKD (chronic kidney disease) stage V ([place]) 11/02/2016   11/02/16 Na 136, K 4.7, Bun 30, creat 1.52, TSH 0.86, wbc 6.7, Hgb 11.3, plt 264  . Hypothyroidism 10/27/2016  . Osteoarthritis 10/27/2016  . Thyroid disease    Past Surgical History:  Procedure Laterality Date  . ABDOMINAL HYSTERECTOMY  1986  . INTRAMEDULLARY (IM) NAIL INTERTROCHANTERIC Right 04/04/2019   Procedure: INTRAMEDULLARY (IM) NAIL INTERTROCHANTRIC;  Surgeon: Rod Can, MD;  Location: [facility];  Service: Orthopedics;  Laterality: Right;  . KNEE ARTHROSCOPY  2015/2017   Dr. Gustavus Bryant  . SPINE SURGERY  2009, 2011   Dr.Mark Nicoletta Dress  . TIBIA IM NAIL INSERTION Right 01/01/2020   Procedure: INTRAMEDULLARY (IM) NAIL TIBIAL;  Surgeon:  Haddix, Thomasene Lot, MD;  Location: [facility];  Service: Orthopedics;  Laterality: Right;    No Known Allergies  Allergies as of 04/04/2022   No Known Allergies      Medication List        Accurate as of April 04, 2022 11:59 PM. If you have any questions, ask your nurse or doctor.          acetaminophen 325 MG tablet Commonly known as: TYLENOL Take 650 mg by mouth 2 (two) times daily.   clonazePAM 0.5 MG tablet Commonly known as: KLONOPIN Take 1 tablet (0.5 mg total) by mouth every 6 (six) hours as needed for anxiety.   divalproex 125 MG DR tablet Commonly known as: DEPAKOTE Take 250 mg by mouth at bedtime.   DULoxetine 30 MG capsule Commonly known as: CYMBALTA Take 30 mg by mouth daily.   furosemide 20 MG tablet Commonly known as: LASIX Take 20 mg by mouth every evening.   furosemide 40  MG tablet Commonly known as: LASIX Take 40 mg by mouth daily. Continue 40 mg daily (add 20 mg daily at 2 pm)   levothyroxine 25 MCG tablet Commonly known as: SYNTHROID Take 25 mcg by mouth daily before breakfast.   OcuSoft Eyelid Cleansing Pads Apply topically in the morning and at bedtime. One wipe per eye and cleanse both eyes for excess secretions   ondansetron 4 MG/5ML solution Commonly known as: ZOFRAN Take 4 mg by mouth daily as needed for nausea or vomiting.   polyethylene glycol 17 g packet Commonly known as: MIRALAX / GLYCOLAX Take 17 g by mouth daily.   potassium chloride 10 MEQ CR capsule Commonly known as: MICRO-K Take 10 mEq by mouth daily.   risperiDONE 1 MG tablet Commonly known as: RISPERDAL Take 1 mg by mouth at bedtime.   traMADol 50 MG tablet Commonly known as: ULTRAM Take 0.5 tablets (25 mg total) by mouth every 6 (six) hours as needed.   traMADol 50 MG tablet Commonly known as: ULTRAM Take 0.5 tablets (25 mg total) by mouth 2 (two) times daily.        Review of Systems  Unable to perform ROS: Dementia    Immunization History  Administered Date(s) Administered  . Influenza-Unspecified 05/29/2017, 05/20/2020, 05/26/2021  . Moderna SARS-COV2 Booster Vaccination 01/05/2021  . Moderna Sars-Covid-2 Vaccination 08/27/2019, 09/07/2019, 10/05/2019, 06/16/2020  . PFIZER(Purple Top)SARS-COV-2 Vaccination 04/27/2021  . Pneumococcal Conjugate-13 06/21/2017  . Pneumococcal Polysaccharide-23 07/16/2018  . Tdap 06/21/2017   Pertinent  Health Maintenance Due  Topic Date Due  . DEXA SCAN  Completed  . INFLUENZA VACCINE  Discontinued      01/11/2020    8:02 AM 01/11/2020    9:00 PM 01/12/2020    7:30 AM 02/01/2021    3:25 PM 03/11/2022   10:53 AM  Fall Risk  Falls in the past year?    1 0  Was there an injury with Fall?    1 0  Fall Risk Category Calculator    2 0  Fall Risk Category    Moderate Low  Patient Fall Risk Level High fall risk High fall risk  High fall risk High fall risk Low fall risk  Patient at Risk for Falls Due to    History of fall(s) No Fall Risks  Fall risk Follow up    Falls evaluation completed Falls evaluation completed   Functional Status Survey:    Vitals:   04/04/22 1240 04/07/22 1237  BP: (!) 144/78 (!) 144/78  Pulse: (!) 58 (!) 58  Resp: 16 16  Temp: 98 F (36.7 C) 98 F (36.7 C)  SpO2: 91% 91%   There is no height or weight on file to calculate BMI. Physical Exam Vitals and nursing note reviewed.  Constitutional:      Appearance: Normal appearance.  HENT:     Head: Normocephalic and atraumatic.     Mouth/Throat:     Mouth: Mucous membranes are moist.  Eyes:     Extraocular Movements: Extraocular movements intact.     Conjunctiva/sclera: Conjunctivae normal.     Pupils: Pupils are equal, round, and reactive to light.     Comments: Crusted eyelashes, no green drainage, no redness in conjunctivae.   Cardiovascular:     Rate and Rhythm: Normal rate and regular rhythm.  Pulmonary:     Effort: Pulmonary effort is normal.     Breath sounds: No rales.  Abdominal:     General: Bowel sounds are normal.     Palpations: Abdomen is soft.     Tenderness: There is no abdominal tenderness.  Musculoskeletal:     Right lower leg: Edema present.     Left lower leg: Edema present.     Comments: Edema trace BLE, dependent, trace to 1+  Skin:    General: Skin is warm and dry.     Comments: R+L heel pressure ulcer scars. Decreased R+L shoulder ROM symmetrically, stiffness all limbs.   Neurological:     General: No focal deficit present.     Mental Status: She is alert.     Gait: Gait abnormal.  Psychiatric:        Mood and Affect: Mood normal.     Comments: confused.     Labs reviewed: Recent Labs    06/25/21 0000 12/24/21 0000  NA 144 141  K 3.8 3.9  CL 105 104  CO2 28* 28*  BUN 26* 23*  CREATININE 1.1 0.8  CALCIUM 9.7 10.0   Recent Labs    12/24/21 0000  AST 9*  ALT 6*  ALKPHOS 78   ALBUMIN 3.4*   No results for input(s): "WBC", "NEUTROABS", "HGB", "HCT", "MCV", "PLT" in the last 8760 hours. Lab Results  Component Value Date   TSH 1.34 12/24/2021   Lab Results  Component Value Date   HGBA1C 5.3 11/01/2016   No results found for: "CHOL", "HDL", "LDLCALC", "LDLDIRECT", "TRIG", "CHOLHDL"  Significant Diagnostic Results in last 30 days:  No results found.  Assessment/Plan: Blepharitis of both eyes Apply 0.5% Erythromycin Ophthalmic oint 1/2 inch ribbon to the R+L lower conjunctival sac nightly x 12 weeks.   Dependent edema dependent, better, on Furosemide, Bun/creat 23/0.8 12/24/21  Slow transit constipation Stable, takes MiraLax.  Rectocele   Rectocele avoid constipation  Mixed Alzheimer's and vascular dementia ([place])  less impulsive, no safety awareness, off  [facility], resides in [facility], under Hospice service.   Anxiety and depression  takes Risperdal,  Depakote, Duloxetine, Clonazepam prn,  TSH 1.34 12/24/21  Essential hypertension Blood pressure is controlled, continue Furosemide.   Hypothyroidism  takes Levothyroxine. TSH 1.34 12/24/21  Generalized osteoarthritis of multiple sites   Multiple sites, managed with Tylenol, Tramadol.     Family/ staff Communication: plan of care reviewed with the patient and charge nurse.   Labs/tests ordered:  none  Time spend 35 minutes.

## 2022-04-04 NOTE — Assessment & Plan Note (Signed)
Rectocele avoid constipation

## 2022-04-04 NOTE — Assessment & Plan Note (Signed)
Blood pressure is controlled, continue Furosemide.  

## 2022-04-04 NOTE — Assessment & Plan Note (Signed)
Apply 0.5% Erythromycin Ophthalmic oint 1/2 inch ribbon to the R+L lower conjunctival sac nightly x 12 weeks.

## 2022-04-04 NOTE — Assessment & Plan Note (Signed)
less impulsive, no safety awareness, off  [place], resides in SNF [facility], under Hospice service.

## 2022-04-04 NOTE — Assessment & Plan Note (Signed)
Multiple sites, managed with Tylenol, Tramadol.

## 2022-04-04 NOTE — Assessment & Plan Note (Signed)
dependent, better, on Furosemide, Bun/creat 23/0.8 12/24/21

## 2022-04-04 NOTE — Assessment & Plan Note (Signed)
takes Risperdal,  Depakote, Duloxetine, Clonazepam prn,  TSH 1.34 12/24/21

## 2022-04-04 NOTE — Assessment & Plan Note (Signed)
Stable, takes MiraLax  

## 2022-04-04 NOTE — Assessment & Plan Note (Signed)
takes Levothyroxine. TSH 1.34 12/24/21

## 2022-04-07 ENCOUNTER — Encounter: Payer: Self-pay | Admitting: Nurse Practitioner

## 2022-04-12 ENCOUNTER — Non-Acute Institutional Stay (SKILLED_NURSING_FACILITY): Admitting: Family Medicine

## 2022-04-12 ENCOUNTER — Encounter: Payer: Self-pay | Admitting: Family Medicine

## 2022-04-12 DIAGNOSIS — F0280 Dementia in other diseases classified elsewhere without behavioral disturbance: Secondary | ICD-10-CM | POA: Diagnosis not present

## 2022-04-12 DIAGNOSIS — F0150 Vascular dementia without behavioral disturbance: Secondary | ICD-10-CM | POA: Diagnosis not present

## 2022-04-12 DIAGNOSIS — G309 Alzheimer's disease, unspecified: Secondary | ICD-10-CM | POA: Diagnosis not present

## 2022-04-12 NOTE — Progress Notes (Signed)
Location:  [facility] Room Number: 42-A Place of Service:  SNF [phone]) Provider:  Lillette Boxer.Rinaldo Cloud, MD  Patient Care Team: [name], MD as PCP - General (Internal Medicine) Mast, Man X, NP as Nurse Practitioner (Internal Medicine) [name], MD (Internal Medicine)  Extended Emergency Contact Information Primary Emergency Contact: [facility] Address: [address]          [place], [place] [address] [name] of [place] Phone: [phone] Relation: Son Secondary Emergency Contact: [facility] Address: [address] [place] [address] [name] Phone: [phone] Relation: Daughter  Code Status:  DNR Goals of care: Advanced Directive information    04/12/2022    9:49 AM  Advanced Directives  Does Patient Have a Medical Advance Directive? Yes  Type of Paramedic of [place];Living will;Out of facility DNR (pink MOST or yellow form)  Does patient want to make changes to medical advance directive? No - Patient declined  Copy of [place] in Chart? Yes - validated most recent copy scanned in chart (See row information)  Pre-existing out of facility DNR order (yellow form or pink MOST form) Pink MOST/Yellow Form most recent copy in chart - Physician notified to receive inpatient order     Chief Complaint  Patient presents with   Routine    HPI:  Pt is a 88 y.o. female seen today for medical management of chronic diseases.  Patient was examined in her room today.  There was a member of nursing staff present who provided history.  For the past several days she has had minimal intake and been minimally responsive.  Her baseline is aphasic.  Nurse reported doing oral care.  Hygiene was poor she may be pocketing food.  She responds only with some moaning when care is given.   Nursing Home  04/12/2022 [place] [facility]  Sabra Heck, Lillette Boxer, MD Family Medicine Adult failure to thrive +1 more Dx Routine Reason for Visit   Additional Documentation  Vitals:  BP 135/69 Pulse 67 Temp 97.1 F (36.2 C) Important   Resp 18 Ht '5\' 2"'$  (1.575 m) Wt 87 lb (39.5 kg) SpO2 92% BMI 15.91 kg/m BSA 1.31 m  Flowsheets:  Nursing Home Patient Info, Vital Signs, NEWS, MEWS Score, Anthropometrics, Healthcare Directives   Encounter Info:  Billing Info, History, Allergies, Detailed Report    All Notes    Progress Notes by Wardell Honour, MD at 04/12/2022 9:05 AM  Author: Wardell Honour, MD Author Type: Physician Filed: 04/12/2022  2:36 PM  Note Status: Signed Cosign: Cosign Not Required Encounter Date: 04/12/2022  Editor: Wardell Honour, MD (Physician)      Prior Versions: 1. McClurkin, Courtney Heys, CMA Estate agent) at 04/12/2022  9:17 AM - Sign when Signing Visit  Expand AllCollapse All    Location:  [facility] Room Number: 21-A Place of Service:  SNF [phone]) Provider:  Lillette Boxer. Muranda Coye,MD   Mast, Man X, NP   Patient Care Team: Mast, Man X, NP as PCP - General (Internal Medicine) Altheimer, Legrand Como, MD as Consulting Physician (Endocrinology) Erroll Luna, MD as Consulting Physician (General Surgery) Kathrynn Ducking, MD (Inactive) as Consulting Physician (Neurology) Mast, Man X, NP as Nurse Practitioner (Internal Medicine)   Extended Emergency Contact Information Primary Emergency Contact: HAYNES,ROBERT E Address: [address]  [place]  [place] Home Phone: [phone] Relation: None   Code Status:  DNR Goals of care: Advanced Directive information     04/12/2022    9:14 AM  Advanced Directives  Does Patient Have a Medical Advance Directive? Yes  Type of Paramedic of [place];Living will;Out of facility DNR (pink MOST or yellow form)  Does patient want to make changes to medical advance directive? No -  Patient declined  Copy of [place] in Chart? Yes - validated most recent copy scanned in chart (See row information)  Pre-existing out of facility DNR order (yellow form or pink MOST form) Pink MOST/Yellow Form most recent copy in chart - Physician notified to receive inpatient order           Chief Complaint  Patient presents with   Routine      HPI:  Pt is a 88 y.o. female seen today for medical management of chronic diseases.  She is on hospice care With end-stage dementia.  I spoke to nursing staff today while I was in patient's room.  She is minimally responsive.  May have been pocketing food as her oral care seem to be very poor.  She had formally spent time out of bed in her [place] chair but now stays in the bed by history. I was unable to rouse her with gentle pressure or shaking Nursing does report ports some moaning with moving patient as if she is uncomfortable although she is unable to vocalize same       Past Medical History:  Diagnosis Date   Back pain     Cerebral atrophy ([place])     Dementia of the Alzheimer's type without behavioral disturbance ([place]) 01/21/2016   Dysphagia, oropharyngeal phase     Glaucoma     History of breast cancer     History of cerebrovascular disease 05/16/2013   Hyperlipidemia     Memory deficit 01/08/2013   Muscular weakness     Osteopenia     Pelvis fracture, right ([place]) 01/15/2016   T11 vertebral fracture ([place])     Vitamin D deficiency     Xerophthalmia           Past Surgical History:  Procedure Laterality Date   CATARACT EXTRACTION Bilateral 2010   CHOLECYSTECTOMY, LAPAROSCOPIC       Lumpectomy of the breast Right     Ovarian cyst resection       SUBDURAL HEMATOMA EVACUATION VIA CRANIOTOMY Bilateral     TONSILLECTOMY               Allergies  Allergen Reactions   Dilantin [Phenytoin]        Documented on [facility]          Outpatient Encounter Medications as of 04/12/2022  Medication Sig   acetaminophen (TYLENOL)  325 MG tablet Take 650 mg by mouth 3 (three) times daily as needed for mild pain.    bimatoprost (LUMIGAN) 0.01 % SOLN Place 1 drop into both eyes daily.    cycloSPORINE (RESTASIS) 0.05 % ophthalmic emulsion Place 1 drop into both eyes 2 (two) times daily.   dorzolamide-timolol (COSOPT) 22.3-6.8 MG/ML ophthalmic solution Place 1 drop into the right eye 2 (two) times daily.   LORazepam (ATIVAN) 0.5 MG tablet Take 1 tablet (0.5 mg total) by mouth every 8 (eight) hours as needed for anxiety.   memantine (NAMENDA) 10 MG tablet TAKE 1 TABLET TWICE DAILY.   polyethylene glycol (MIRALAX / GLYCOLAX) packet  Take 17 g by mouth daily. MIX 17GM MIRALAX POWDER WITH 4 OUNCES OF ORANGE JUICE AND TAKE BY MOUTH EACH MORNING. RESIDENT PREFERS ORANGE JUICE.   Polyvinyl Alcohol-Povidone 5-6 MG/ML SOLN Apply 2 drops to eye in the morning and at bedtime. Each eye   Sodium Fluoride (PREVIDENT 5000 BOOSTER PLUS) 1.1 % PSTE Place onto teeth daily.    No facility-administered encounter medications on file as of 04/12/2022.      Review of Systems  Unable to perform ROS: Patient unresponsive (Patient aphasic and unresponsive)          Immunization History  Administered Date(s) Administered   Influenza Whole 05/09/2018   Influenza, High Dose Seasonal PF 05/10/2019   Influenza-Unspecified 05/09/2015, 05/24/2016, 06/01/2017, 05/20/2020, 05/26/2021   Moderna SARS-COV2 Booster Vaccination 01/05/2021   Moderna Sars-Covid-2 Vaccination 08/10/2019, 09/07/2019, 06/16/2020   PFIZER(Purple Top)SARS-COV-2 Vaccination 04/27/2021   PPD Test 01/07/2016   Pneumococcal Conjugate-13 05/25/2017   Pneumococcal Polysaccharide-23 05/08/2010   Tdap 05/18/2017        Pertinent  Health Maintenance Due  Topic Date Due   INFLUENZA VACCINE  03/08/2022   DEXA SCAN  Completed        01/21/2016    1:37 PM 04/13/2017    4:17 PM 04/24/2018    1:40 PM  Fall Risk  Falls in the past year? Yes No No  Was there an injury with Fall? Yes         Functional Status Survey:      Vitals:    04/12/22 0907  BP: 135/69  Pulse: 67  Resp: 18  Temp: (!) 97.1 F (36.2 C)  SpO2: 92%  Weight: 87 lb (39.5 kg)  Height: '5\' 2"'$  (1.575 m)    Body mass index is 15.91 kg/m. Physical Exam Vitals reviewed.  Constitutional:      Appearance: She is toxic-appearing.  Cardiovascular:     Rate and Rhythm: Normal rate and regular rhythm.  Pulmonary:     Effort: Pulmonary effort is normal.     Breath sounds: Normal breath sounds.  Neurological:     Comments: Unable to query patient        Labs reviewed: Recent Labs (within last 365 days)  No results for input(s): "NA", "K", "CL", "CO2", "GLUCOSE", "BUN", "CREATININE", "CALCIUM", "MG", "PHOS" in the last 8760 hours.   Recent Labs (within last 365 days)  No results for input(s): "AST", "ALT", "ALKPHOS", "BILITOT", "PROT", "ALBUMIN" in the last 8760 hours.   Recent Labs (within last 365 days)  No results for input(s): "WBC", "NEUTROABS", "HGB", "HCT", "MCV", "PLT" in the last 8760 hours.   Recent Labs       Lab Results  Component Value Date    TSH 2.12 01/31/2020      Recent Labs  No results found for: "HGBA1C"   Recent Labs        Lab Results  Component Value Date    CHOL   06/10/2010      185 (NOTE) ATP III Classification:      < 200        mg/dL        Desirable     200 - 239     mg/dL        Borderline High     >= 240        mg/dL        High     HDL 68 06/10/2010    LDLCALC (H) 06/10/2010      105 (  NOTE)  Total Cholesterol/HDL Ratio:CHD Risk                       Coronary Heart Disease Risk Table                                       Men       Women         1/2 Average Risk              3.4        3.3             Average Risk              5.0         4.4         2 X Average Risk              9.6        7.1         3 X Average Risk             23.4       11.0 Use the calculated Patient Ratio above and the CHD Risk table  to determine the patient's CHD Risk. ATP III  Classification (LDL):      < 100         mg/dL         Optimal     100 - 129     mg/dL         Near or Above Optimal     130 - 159     mg/dL         Borderline High     160 - 189     mg/dL         High      > 190        mg/dL         Very High     TRIG 59 06/10/2010    CHOLHDL 2.7 06/10/2010        Significant Diagnostic Results in last 30 days:  Imaging Results  No results found.     Assessment/Plan There are no diagnoses linked to this encounter. 1. Adult failure to thrive This is all part of the end stages of Alzheimer's   2. Dementia of the Alzheimer's type without behavioral disturbance [facility]) Patient is end stage.  With minimal p.o. intake her death would appear to be imminent.  I wonder if she would benefit from Roxanol for comfort since staff reports some seeming discomfort with care   Family/ staff Communication:    Labs/tests ordered:                  Communication Routing History  None  No questionnaires available.             Orders Placed  None Medication Changes   None Medication List Visit Diagnoses   Adult failure to thrive  Dementia of the Alzheimer's type without behavioral disturbance [facility]) Problem List Encounter Status  Signed by Wardell Honour, MD on 04/12/22 at 14:36 Level of Service  Level of Service  PR SBSQ NURSING FACILITY CARE LOW MDM 15 MINUTES [24097]   All Charges for This Encounter  Code Description Service Date Service Provider Modifiers Qty  [place] 15  MINUTES 04/12/2022 Wardell Honour, MD  1     Past Medical History:  Diagnosis Date   Alzheimer disease ([place]) 10/27/2016   Arthritis    Confusion 04/04/2019   Depression, recurrent ([place]) 10/27/2016   History of fracture of right hip 04/04/2019   07/01/19 Ortho, R hip incision healed, X-ray looks good, s/p IMN R hip fx, WBAT, f/u 6 mos.    HTN (hypertension) 10/27/2016   Hypertension    Hypertensive kidney disease with  CKD (chronic kidney disease) stage V ([place]) 11/02/2016   11/02/16 Na 136, K 4.7, Bun 30, creat 1.52, TSH 0.86, wbc 6.7, Hgb 11.3, plt 264   Hypothyroidism 10/27/2016   Osteoarthritis 10/27/2016   Thyroid disease    Past Surgical History:  Procedure Laterality Date   ABDOMINAL HYSTERECTOMY  1986   INTRAMEDULLARY (IM) NAIL INTERTROCHANTERIC Right 04/04/2019   Procedure: INTRAMEDULLARY (IM) NAIL INTERTROCHANTRIC;  Surgeon: Rod Can, MD;  Location: [facility];  Service: Orthopedics;  Laterality: Right;   KNEE ARTHROSCOPY  2015/2017   Dr. Gustavus Bryant   SPINE SURGERY  2009, 2011   Dr.Mark Nicoletta Dress   TIBIA IM NAIL INSERTION Right 01/01/2020   Procedure: INTRAMEDULLARY (IM) NAIL TIBIAL;  Surgeon: Shona Needles, MD;  Location: [facility];  Service: Orthopedics;  Laterality: Right;    No Known Allergies  Outpatient Encounter Medications as of 04/12/2022  Medication Sig   acetaminophen (TYLENOL) 325 MG tablet Take 650 mg by mouth 2 (two) times daily.   clonazePAM (KLONOPIN) 0.5 MG tablet Take 1 tablet (0.5 mg total) by mouth every 6 (six) hours as needed for anxiety.   divalproex (DEPAKOTE) 125 MG DR tablet Take 250 mg by mouth at bedtime.   DULoxetine (CYMBALTA) 30 MG capsule Take 30 mg by mouth daily.   erythromycin ophthalmic ointment Administer 1/2 inch ribbon to right and left subconjunctival sac. At Bedtime; BEDTIME 07:00 PM - 10:00 PM   Eyelid Cleansers (OCUSOFT EYELID CLEANSING) PADS Apply topically in the morning and at bedtime. One wipe per eye and cleanse both eyes for excess secretions   furosemide (LASIX) 20 MG tablet Take 20 mg by mouth every evening.   furosemide (LASIX) 40 MG tablet Take 40 mg by mouth daily. Continue 40 mg daily (add 20 mg daily at 2 pm)   levothyroxine (SYNTHROID, LEVOTHROID) 25 MCG tablet Take 25 mcg by mouth daily before breakfast.   ondansetron (ZOFRAN) 4 MG/5ML solution Take 4 mg by mouth daily as needed for nausea or vomiting.   polyethylene glycol (MIRALAX / GLYCOLAX) 17  g packet Take 17 g by mouth daily.   potassium chloride (MICRO-K) 10 MEQ CR capsule Take 10 mEq by mouth daily.   risperiDONE (RISPERDAL) 1 MG tablet Take 1 mg by mouth at bedtime.   traMADol (ULTRAM) 50 MG tablet Take 0.5 tablets (25 mg total) by mouth every 6 (six) hours as needed.   traMADol (ULTRAM) 50 MG tablet Take 0.5 tablets (25 mg total) by mouth 2 (two) times daily.   No facility-administered encounter medications on file as of 04/12/2022.    Review of Systems  Unable to perform ROS: Patient nonverbal    Immunization History  Administered Date(s) Administered   Influenza-Unspecified 05/29/2017, 05/20/2020, 05/26/2021   Moderna SARS-COV2 Booster Vaccination 01/05/2021, 12/24/2021   Moderna Sars-Covid-2 Vaccination 08/27/2019, 09/07/2019, 10/05/2019, 06/16/2020   PFIZER(Purple Top)SARS-COV-2 Vaccination 04/27/2021   Pneumococcal Conjugate-13 06/21/2017   Pneumococcal Polysaccharide-23 07/16/2018   Tdap 06/21/2017   Pertinent  Health Maintenance Due  Topic Date Due  DEXA SCAN  Completed   INFLUENZA VACCINE  Discontinued      01/11/2020    8:02 AM 01/11/2020    9:00 PM 01/12/2020    7:30 AM 02/01/2021    3:25 PM 03/11/2022   10:53 AM  Fall Risk  Falls in the past year?    1 0  Was there an injury with Fall?    1 0  Fall Risk Category Calculator    2 0  Fall Risk Category    Moderate Low  Patient Fall Risk Level High fall risk High fall risk High fall risk High fall risk Low fall risk  Patient at Risk for Falls Due to    History of fall(s) No Fall Risks  Fall risk Follow up    Falls evaluation completed Falls evaluation completed   Functional Status Survey:    Vitals:   04/12/22 0946  BP: (!) 144/78  Pulse: 75  Resp: 18  Temp: 97.8 F (36.6 C)  SpO2: (!) 87%  Weight: 152 lb 3.2 oz (69 kg)  Height: '4\' 11"'$  (1.499 m)   Body mass index is 30.74 kg/m. Physical Exam Vitals and nursing note reviewed.  Constitutional:      Appearance: She is ill-appearing.   Cardiovascular:     Rate and Rhythm: Normal rate and regular rhythm.     Heart sounds: Normal heart sounds.  Pulmonary:     Effort: Pulmonary effort is normal.     Breath sounds: Normal breath sounds.  Neurological:     Mental Status: She is unresponsive.     Labs reviewed: Recent Labs    06/25/21 0000 12/24/21 0000  NA 144 141  K 3.8 3.9  CL 105 104  CO2 28* 28*  BUN 26* 23*  CREATININE 1.1 0.8  CALCIUM 9.7 10.0   Recent Labs    12/24/21 0000  AST 9*  ALT 6*  ALKPHOS 78  ALBUMIN 3.4*   No results for input(s): "WBC", "NEUTROABS", "HGB", "HCT", "MCV", "PLT" in the last 8760 hours. Lab Results  Component Value Date   TSH 1.34 12/24/2021   Lab Results  Component Value Date   HGBA1C 5.3 11/01/2016   No results found for: "CHOL", "HDL", "LDLCALC", "LDLDIRECT", "TRIG", "CHOLHDL"  Significant Diagnostic Results in last 30 days:  No results found.  Assessment/Plan There are no diagnoses linked to this encounter. 1. Mixed Alzheimer's and vascular dementia [facility]) Patient appears to be in and stage dementia.  There is little if any oral intake and see does not respond to minimal stimuli.  Since she is already being followed by hospice I think the addition of morphine might make her more comfortable.   Family/ staff Communication:   Labs/tests ordered:

## 2022-04-13 ENCOUNTER — Encounter: Payer: Self-pay | Admitting: Adult Health

## 2022-04-13 ENCOUNTER — Non-Acute Institutional Stay (SKILLED_NURSING_FACILITY): Payer: Medicare Other | Admitting: Adult Health

## 2022-04-13 DIAGNOSIS — F419 Anxiety disorder, unspecified: Secondary | ICD-10-CM | POA: Diagnosis not present

## 2022-04-13 DIAGNOSIS — F0280 Dementia in other diseases classified elsewhere without behavioral disturbance: Secondary | ICD-10-CM

## 2022-04-13 DIAGNOSIS — B372 Candidiasis of skin and nail: Secondary | ICD-10-CM

## 2022-04-13 DIAGNOSIS — F32A Depression, unspecified: Secondary | ICD-10-CM | POA: Diagnosis not present

## 2022-04-13 DIAGNOSIS — G309 Alzheimer's disease, unspecified: Secondary | ICD-10-CM | POA: Diagnosis not present

## 2022-04-13 DIAGNOSIS — F0150 Vascular dementia without behavioral disturbance: Secondary | ICD-10-CM

## 2022-04-13 NOTE — Progress Notes (Signed)
Location:  [facility] Room Number: NO/42/A Place of Service:  SNF (31) Provider:  Durenda Age, DNP, FNP-BC  Patient Care Team: [name], MD as PCP - General (Internal Medicine) Mast, Man X, NP as Nurse Practitioner (Internal Medicine) [name], MD (Internal Medicine)  Extended Emergency Contact Information Primary Emergency Contact: [facility] Address: [address]          [place], [place] [address] [name] of [place] Phone: [phone] Relation: Son Secondary Emergency Contact: [facility] Address: [address] [place] [address] [name] Phone: [phone] Relation: Daughter  Code Status:  DNR  Goals of care: Advanced Directive information    04/12/2022    9:49 AM  Advanced Directives  Does Patient Have a Medical Advance Directive? Yes  Type of Paramedic of [place];Living will;Out of facility DNR (pink MOST or yellow form)  Does patient want to make changes to medical advance directive? No - Patient declined  Copy of [place] in Chart? Yes - validated most recent copy scanned in chart (See row information)  Pre-existing out of facility DNR order (yellow form or pink MOST form) Pink MOST/Yellow Form most recent copy in chart - Physician notified to receive inpatient order     Chief Complaint  Patient presents with   Acute Visit    Patient is being seen for a rash on face and neck    HPI:  Haley Cohen is a 88 y.o. female seen today for an acute visit regarding rashes on her neck.  She has a PMH of hypertension, rectocele, constipation, hypothyroidism, bilateral tibial fractures, osteoarthritis and CKD. She was seen today in her room. She was noted to have erythematous rashes on her left neck. She has contracted neck and unable to lift her change position of her neck. Area is erythematous and moist. She is noted to be sleeping. Staff reported  that she has slept all afternoon and was unable to take medications, drink nor eat. She is followed by hospice for advanced dementia.    Past Medical History:  Diagnosis Date   Alzheimer disease ([place]) 10/27/2016   Arthritis    Confusion 04/04/2019   Depression, recurrent ([place]) 10/27/2016   History of fracture of right hip 04/04/2019   07/01/19 Ortho, R hip incision healed, X-ray looks good, s/p IMN R hip fx, WBAT, f/u 6 mos.    HTN (hypertension) 10/27/2016   Hypertension    Hypertensive kidney disease with CKD (chronic kidney disease) stage V ([place]) 11/02/2016   11/02/16 Na 136, K 4.7, Bun 30, creat 1.52, TSH 0.86, wbc 6.7, Hgb 11.3, plt 264   Hypothyroidism 10/27/2016   Osteoarthritis 10/27/2016   Thyroid disease    Past Surgical History:  Procedure Laterality Date   ABDOMINAL HYSTERECTOMY  1986   INTRAMEDULLARY (IM) NAIL INTERTROCHANTERIC Right 04/04/2019   Procedure: INTRAMEDULLARY (IM) NAIL INTERTROCHANTRIC;  Surgeon: Rod Can, MD;  Location: [facility];  Service: Orthopedics;  Laterality: Right;   KNEE ARTHROSCOPY  2015/2017   Dr. Gustavus Bryant   SPINE SURGERY  2009, 2011   Dr.Mark Nicoletta Dress   TIBIA IM NAIL INSERTION Right 01/01/2020   Procedure: INTRAMEDULLARY (IM) NAIL TIBIAL;  Surgeon: Shona Needles, MD;  Location: [facility];  Service: Orthopedics;  Laterality: Right;    No Known Allergies  Outpatient Encounter Medications as of 04/13/2022  Medication Sig   acetaminophen (TYLENOL) 325 MG tablet Take  650 mg by mouth 2 (two) times daily.   clonazePAM (KLONOPIN) 0.5 MG tablet Take 1 tablet (0.5 mg total) by mouth every 6 (six) hours as needed for anxiety.   divalproex (DEPAKOTE) 125 MG DR tablet Take 250 mg by mouth at bedtime.   DULoxetine (CYMBALTA) 30 MG capsule Take 30 mg by mouth daily.   erythromycin ophthalmic ointment Administer 1/2 inch ribbon to right and left subconjunctival sac. At Bedtime; BEDTIME 07:00 PM - 10:00 PM   Eyelid Cleansers (OCUSOFT EYELID CLEANSING) PADS Apply  topically in the morning and at bedtime. One wipe per eye and cleanse both eyes for excess secretions   furosemide (LASIX) 20 MG tablet Take 20 mg by mouth every evening.   furosemide (LASIX) 40 MG tablet Take 40 mg by mouth daily. Continue 40 mg daily (add 20 mg daily at 2 pm)   levothyroxine (SYNTHROID, LEVOTHROID) 25 MCG tablet Take 25 mcg by mouth daily before breakfast.   ondansetron (ZOFRAN) 4 MG/5ML solution Take 4 mg by mouth daily as needed for nausea or vomiting.   polyethylene glycol (MIRALAX / GLYCOLAX) 17 g packet Take 17 g by mouth daily.   potassium chloride (MICRO-K) 10 MEQ CR capsule Take 10 mEq by mouth daily.   risperiDONE (RISPERDAL) 1 MG tablet Take 1 mg by mouth at bedtime.   traMADol (ULTRAM) 50 MG tablet Take 0.5 tablets (25 mg total) by mouth every 6 (six) hours as needed.   traMADol (ULTRAM) 50 MG tablet Take 0.5 tablets (25 mg total) by mouth 2 (two) times daily.   No facility-administered encounter medications on file as of 04/13/2022.    Review of Systems  Unable to obtain due to dementia.    Immunization History  Administered Date(s) Administered   Influenza-Unspecified 05/29/2017, 05/20/2020, 05/26/2021   Moderna SARS-COV2 Booster Vaccination 01/05/2021, 12/24/2021   Moderna Sars-Covid-2 Vaccination 08/27/2019, 09/07/2019, 10/05/2019, 06/16/2020   PFIZER(Purple Top)SARS-COV-2 Vaccination 04/27/2021   Pneumococcal Conjugate-13 06/21/2017   Pneumococcal Polysaccharide-23 07/16/2018   Tdap 06/21/2017   Pertinent  Health Maintenance Due  Topic Date Due   DEXA SCAN  Completed   INFLUENZA VACCINE  Discontinued      01/11/2020    8:02 AM 01/11/2020    9:00 PM 01/12/2020    7:30 AM 02/01/2021    3:25 PM 03/11/2022   10:53 AM  Fall Risk  Falls in the past year?    1 0  Was there an injury with Fall?    1 0  Fall Risk Category Calculator    2 0  Fall Risk Category    Moderate Low  Patient Fall Risk Level High fall risk High fall risk High fall risk High fall  risk Low fall risk  Patient at Risk for Falls Due to    History of fall(s) No Fall Risks  Fall risk Follow up    Falls evaluation completed Falls evaluation completed     Vitals:   04/13/22 1259  BP: (!) 144/78  Pulse: 75  Resp: 18  Temp: (!) 97.3 F (36.3 C)  SpO2: 90%  Weight: 152 lb (68.9 kg)  Height: '4\' 11"'$  (1.499 m)   Body mass index is 30.7 kg/m.  Physical Exam Constitutional:      General: She is not in acute distress. HENT:     Head: Normocephalic and atraumatic.     Nose: Nose normal.     Mouth/Throat:     Mouth: Mucous membranes are moist.  Eyes:     Conjunctiva/sclera:  Conjunctivae normal.  Neck:     Comments: Neck contracted to the left Cardiovascular:     Rate and Rhythm: Normal rate and regular rhythm.  Pulmonary:     Effort: Pulmonary effort is normal.     Breath sounds: Normal breath sounds.  Abdominal:     General: Bowel sounds are normal.     Palpations: Abdomen is soft.  Musculoskeletal:     Comments: Neck contracted to the left side.  Skin:    Comments: Erythematous rashes on left neck.  Neurological:     Comments: sleeping        Labs reviewed: Recent Labs    06/25/21 0000 12/24/21 0000  NA 144 141  K 3.8 3.9  CL 105 104  CO2 28* 28*  BUN 26* 23*  CREATININE 1.1 0.8  CALCIUM 9.7 10.0   Recent Labs    12/24/21 0000  AST 9*  ALT 6*  ALKPHOS 78  ALBUMIN 3.4*   No results for input(s): "WBC", "NEUTROABS", "HGB", "HCT", "MCV", "PLT" in the last 8760 hours. Lab Results  Component Value Date   TSH 1.34 12/24/2021   Lab Results  Component Value Date   HGBA1C 5.3 11/01/2016   No results found for: "CHOL", "HDL", "LDLCALC", "LDLDIRECT", "TRIG", "CHOLHDL"  Significant Diagnostic Results in last 30 days:  No results found.  Assessment/Plan  1. Candidal skin infection - will start on nystatin powder 100,000 unit/gram apply to rashes on neck 4 times daily x2 weeks -  keep skin clean and dry  2. Anxiety and  depression -  sleepy -  will decrease Klonopin from 0.5 mg twice a day to 0.25 mg twice a day -   Discontinue Depakote  3. Mixed Alzheimer's and vascular dementia ([place]) -Has severe cognitive impairment, -Continue comfort care/hospice    Family/ staff Communication: Discussed plan of care with charge nurse  Labs/tests ordered:   None    Durenda Age, DNP, MSN, FNP-BC [facility] [phone] (Monday-Friday 8:00 a.m. - 5:00 p.m.) [phone] (after hours)

## 2022-05-08 DEATH — deceased
